# Patient Record
Sex: Female | Born: 1970 | Race: Black or African American | Hispanic: No | Marital: Married | Smoking: Never smoker
Health system: Southern US, Community
[De-identification: ages and names within clinical notes are randomized; demographics above are authoritative.]

## PROBLEM LIST (undated history)

## (undated) DIAGNOSIS — R1031 Right lower quadrant pain: Secondary | ICD-10-CM

## (undated) DIAGNOSIS — G43009 Migraine without aura, not intractable, without status migrainosus: Secondary | ICD-10-CM

## (undated) DIAGNOSIS — O039 Complete or unspecified spontaneous abortion without complication: Secondary | ICD-10-CM

## (undated) DIAGNOSIS — L309 Dermatitis, unspecified: Secondary | ICD-10-CM

## (undated) DIAGNOSIS — K219 Gastro-esophageal reflux disease without esophagitis: Secondary | ICD-10-CM

## (undated) DIAGNOSIS — T7840XA Allergy, unspecified, initial encounter: Secondary | ICD-10-CM

## (undated) DIAGNOSIS — K5900 Constipation, unspecified: Secondary | ICD-10-CM

## (undated) DIAGNOSIS — J342 Deviated nasal septum: Secondary | ICD-10-CM

## (undated) DIAGNOSIS — E669 Obesity, unspecified: Secondary | ICD-10-CM

## (undated) DIAGNOSIS — N9489 Other specified conditions associated with female genital organs and menstrual cycle: Secondary | ICD-10-CM

## (undated) DIAGNOSIS — G4733 Obstructive sleep apnea (adult) (pediatric): Secondary | ICD-10-CM

## (undated) DIAGNOSIS — M549 Dorsalgia, unspecified: Secondary | ICD-10-CM

## (undated) DIAGNOSIS — I10 Essential (primary) hypertension: Secondary | ICD-10-CM

## (undated) DIAGNOSIS — K5792 Diverticulitis of intestine, part unspecified, without perforation or abscess without bleeding: Secondary | ICD-10-CM

## (undated) DIAGNOSIS — G4730 Sleep apnea, unspecified: Secondary | ICD-10-CM

## (undated) DIAGNOSIS — G8929 Other chronic pain: Secondary | ICD-10-CM

## (undated) DIAGNOSIS — F419 Anxiety disorder, unspecified: Secondary | ICD-10-CM

## (undated) HISTORY — DX: Sleep apnea, unspecified: G47.30

## (undated) HISTORY — DX: Allergy, unspecified, initial encounter: T78.40XA

## (undated) HISTORY — PX: POLYPECTOMY: SHX149

## (undated) HISTORY — DX: Other chronic pain: G89.29

## (undated) HISTORY — DX: Right lower quadrant pain: R10.31

## (undated) HISTORY — DX: Essential (primary) hypertension: I10

## (undated) HISTORY — PX: PARTIAL HYSTERECTOMY: SHX80

## (undated) HISTORY — DX: Anxiety disorder, unspecified: F41.9

## (undated) HISTORY — DX: Other specified conditions associated with female genital organs and menstrual cycle: N94.89

## (undated) HISTORY — DX: Migraine without aura, not intractable, without status migrainosus: G43.009

## (undated) HISTORY — DX: Complete or unspecified spontaneous abortion without complication: O03.9

## (undated) HISTORY — PX: REDUCTION MAMMAPLASTY: SUR839

## (undated) HISTORY — DX: Deviated nasal septum: J34.2

## (undated) HISTORY — DX: Dorsalgia, unspecified: M54.9

## (undated) HISTORY — PX: FOOT SURGERY: SHX648

## (undated) HISTORY — DX: Gastro-esophageal reflux disease without esophagitis: K21.9

## (undated) HISTORY — DX: Dermatitis, unspecified: L30.9

## (undated) HISTORY — DX: Constipation, unspecified: K59.00

## (undated) HISTORY — DX: Obstructive sleep apnea (adult) (pediatric): G47.33

## (undated) HISTORY — DX: Obesity, unspecified: E66.9

---

## 1998-04-13 ENCOUNTER — Other Ambulatory Visit: Admission: RE | Admit: 1998-04-13 | Discharge: 1998-04-13 | Payer: Self-pay | Admitting: Obstetrics and Gynecology

## 1998-10-23 ENCOUNTER — Inpatient Hospital Stay (HOSPITAL_COMMUNITY): Admission: AD | Admit: 1998-10-23 | Discharge: 1998-10-23 | Payer: Self-pay | Admitting: Obstetrics and Gynecology

## 1998-11-12 HISTORY — PX: TUBAL LIGATION: SHX77

## 1998-11-17 ENCOUNTER — Inpatient Hospital Stay (HOSPITAL_COMMUNITY): Admission: AD | Admit: 1998-11-17 | Discharge: 1998-11-19 | Payer: Self-pay | Admitting: Obstetrics and Gynecology

## 1998-12-26 ENCOUNTER — Other Ambulatory Visit: Admission: RE | Admit: 1998-12-26 | Discharge: 1998-12-26 | Payer: Self-pay | Admitting: Obstetrics and Gynecology

## 1999-11-13 HISTORY — PX: COLONOSCOPY: SHX174

## 2000-01-17 ENCOUNTER — Other Ambulatory Visit: Admission: RE | Admit: 2000-01-17 | Discharge: 2000-01-17 | Payer: Self-pay | Admitting: Obstetrics and Gynecology

## 2001-02-12 ENCOUNTER — Other Ambulatory Visit: Admission: RE | Admit: 2001-02-12 | Discharge: 2001-02-12 | Payer: Self-pay | Admitting: Obstetrics and Gynecology

## 2001-08-21 ENCOUNTER — Encounter: Payer: Self-pay | Admitting: Obstetrics and Gynecology

## 2001-08-21 ENCOUNTER — Encounter: Admission: RE | Admit: 2001-08-21 | Discharge: 2001-08-21 | Payer: Self-pay | Admitting: Obstetrics and Gynecology

## 2002-03-18 ENCOUNTER — Other Ambulatory Visit: Admission: RE | Admit: 2002-03-18 | Discharge: 2002-03-18 | Payer: Self-pay | Admitting: Obstetrics and Gynecology

## 2003-03-04 ENCOUNTER — Other Ambulatory Visit: Admission: RE | Admit: 2003-03-04 | Discharge: 2003-03-04 | Payer: Self-pay | Admitting: *Deleted

## 2004-03-13 ENCOUNTER — Encounter: Admission: RE | Admit: 2004-03-13 | Discharge: 2004-03-13 | Payer: Self-pay | Admitting: Family Medicine

## 2004-03-31 ENCOUNTER — Other Ambulatory Visit: Admission: RE | Admit: 2004-03-31 | Discharge: 2004-03-31 | Payer: Self-pay | Admitting: Obstetrics and Gynecology

## 2004-07-03 ENCOUNTER — Encounter: Admission: RE | Admit: 2004-07-03 | Discharge: 2004-07-03 | Payer: Self-pay | Admitting: Internal Medicine

## 2004-09-29 ENCOUNTER — Ambulatory Visit: Payer: Self-pay | Admitting: Family Medicine

## 2005-06-11 ENCOUNTER — Ambulatory Visit: Payer: Self-pay | Admitting: Gastroenterology

## 2005-07-03 ENCOUNTER — Ambulatory Visit: Payer: Self-pay | Admitting: Gastroenterology

## 2005-07-03 LAB — HM MAMMOGRAPHY

## 2006-03-22 ENCOUNTER — Ambulatory Visit: Payer: Self-pay | Admitting: Family Medicine

## 2006-05-13 ENCOUNTER — Encounter: Admission: RE | Admit: 2006-05-13 | Discharge: 2006-05-13 | Payer: Self-pay | Admitting: Obstetrics and Gynecology

## 2006-06-25 ENCOUNTER — Ambulatory Visit (HOSPITAL_COMMUNITY): Admission: RE | Admit: 2006-06-25 | Discharge: 2006-06-25 | Payer: Self-pay | Admitting: Gastroenterology

## 2006-10-11 ENCOUNTER — Ambulatory Visit: Payer: Self-pay | Admitting: Internal Medicine

## 2007-09-09 ENCOUNTER — Ambulatory Visit: Payer: Self-pay | Admitting: Internal Medicine

## 2007-09-12 ENCOUNTER — Ambulatory Visit: Payer: Self-pay | Admitting: Internal Medicine

## 2007-09-15 LAB — CONVERTED CEMR LAB
ALT: 18 units/L (ref 0–35)
Basophils Absolute: 0 10*3/uL (ref 0.0–0.1)
Chloride: 107 meq/L (ref 96–112)
Cholesterol: 183 mg/dL (ref 0–200)
Eosinophils Absolute: 0.1 10*3/uL (ref 0.0–0.6)
Eosinophils Relative: 2 % (ref 0.0–5.0)
GFR calc Af Amer: 104 mL/min
GFR calc non Af Amer: 86 mL/min
Glucose, Bld: 87 mg/dL (ref 70–99)
HCT: 40.2 % (ref 36.0–46.0)
Lymphocytes Relative: 31.2 % (ref 12.0–46.0)
MCHC: 34.8 g/dL (ref 30.0–36.0)
MCV: 93 fL (ref 78.0–100.0)
Neutro Abs: 2.8 10*3/uL (ref 1.4–7.7)
Neutrophils Relative %: 57.7 % (ref 43.0–77.0)
Potassium: 4.1 meq/L (ref 3.5–5.1)
RBC: 4.32 M/uL (ref 3.87–5.11)
Sodium: 141 meq/L (ref 135–145)
Total CHOL/HDL Ratio: 2.9
Triglycerides: 66 mg/dL (ref 0–149)
WBC: 4.8 10*3/uL (ref 4.5–10.5)

## 2008-03-25 ENCOUNTER — Ambulatory Visit: Payer: Self-pay | Admitting: Internal Medicine

## 2008-04-21 ENCOUNTER — Encounter: Payer: Self-pay | Admitting: Internal Medicine

## 2008-04-27 ENCOUNTER — Telehealth (INDEPENDENT_AMBULATORY_CARE_PROVIDER_SITE_OTHER): Payer: Self-pay | Admitting: *Deleted

## 2008-04-28 ENCOUNTER — Telehealth (INDEPENDENT_AMBULATORY_CARE_PROVIDER_SITE_OTHER): Payer: Self-pay | Admitting: *Deleted

## 2008-04-28 ENCOUNTER — Encounter: Payer: Self-pay | Admitting: Internal Medicine

## 2008-06-23 ENCOUNTER — Ambulatory Visit: Payer: Self-pay | Admitting: Internal Medicine

## 2008-06-23 LAB — CONVERTED CEMR LAB
Blood in Urine, dipstick: NEGATIVE
Nitrite: NEGATIVE
Protein, U semiquant: NEGATIVE
Specific Gravity, Urine: 1.01
Urobilinogen, UA: 0.2
WBC Urine, dipstick: NEGATIVE

## 2008-06-29 ENCOUNTER — Encounter: Admission: RE | Admit: 2008-06-29 | Discharge: 2008-06-29 | Payer: Self-pay | Admitting: Internal Medicine

## 2008-06-30 ENCOUNTER — Telehealth: Payer: Self-pay | Admitting: Internal Medicine

## 2008-07-02 ENCOUNTER — Encounter (INDEPENDENT_AMBULATORY_CARE_PROVIDER_SITE_OTHER): Payer: Self-pay | Admitting: *Deleted

## 2008-09-08 ENCOUNTER — Ambulatory Visit: Payer: Self-pay | Admitting: Internal Medicine

## 2008-09-14 ENCOUNTER — Ambulatory Visit: Payer: Self-pay | Admitting: Family Medicine

## 2008-10-12 HISTORY — PX: BREAST REDUCTION SURGERY: SHX8

## 2008-10-12 HISTORY — PX: REDUCTION MAMMAPLASTY: SUR839

## 2009-02-01 ENCOUNTER — Ambulatory Visit: Payer: Self-pay | Admitting: Internal Medicine

## 2009-04-27 ENCOUNTER — Ambulatory Visit: Payer: Self-pay | Admitting: Internal Medicine

## 2009-04-27 DIAGNOSIS — R110 Nausea: Secondary | ICD-10-CM

## 2009-04-28 ENCOUNTER — Ambulatory Visit: Payer: Self-pay | Admitting: Internal Medicine

## 2009-04-28 LAB — CONVERTED CEMR LAB: Beta hcg, urine, semiquantitative: NEGATIVE

## 2009-05-03 LAB — CONVERTED CEMR LAB
ALT: 21 units/L (ref 0–35)
Amylase: 97 units/L (ref 27–131)
Basophils Absolute: 0 10*3/uL (ref 0.0–0.1)
CO2: 28 meq/L (ref 19–32)
Calcium: 8.9 mg/dL (ref 8.4–10.5)
Creatinine, Ser: 0.8 mg/dL (ref 0.4–1.2)
GFR calc non Af Amer: 102.49 mL/min (ref >60)
HCT: 37 % (ref 36.0–46.0)
Hemoglobin: 12.8 g/dL (ref 12.0–15.0)
Lipase: 22 units/L (ref 11.0–59.0)
Lymphs Abs: 1.9 10*3/uL (ref 0.7–4.0)
MCHC: 34.6 g/dL (ref 30.0–36.0)
MCV: 92.1 fL (ref 78.0–100.0)
Monocytes Absolute: 0.3 10*3/uL (ref 0.1–1.0)
Monocytes Relative: 6.3 % (ref 3.0–12.0)
Neutro Abs: 2.2 10*3/uL (ref 1.4–7.7)
Platelets: 195 10*3/uL (ref 150.0–400.0)
RDW: 12.7 % (ref 11.5–14.6)
Total Bilirubin: 1 mg/dL (ref 0.3–1.2)
Total Protein: 7.1 g/dL (ref 6.0–8.3)

## 2009-06-09 ENCOUNTER — Ambulatory Visit: Payer: Self-pay | Admitting: Internal Medicine

## 2009-06-09 DIAGNOSIS — G8929 Other chronic pain: Secondary | ICD-10-CM

## 2009-06-09 DIAGNOSIS — R1031 Right lower quadrant pain: Secondary | ICD-10-CM

## 2009-06-09 LAB — CONVERTED CEMR LAB
Glucose, Urine, Semiquant: NEGATIVE
Nitrite: NEGATIVE
WBC Urine, dipstick: NEGATIVE
pH: 7

## 2009-06-14 ENCOUNTER — Ambulatory Visit: Payer: Self-pay | Admitting: Cardiology

## 2009-06-20 ENCOUNTER — Telehealth: Payer: Self-pay | Admitting: Internal Medicine

## 2009-07-07 ENCOUNTER — Encounter: Admission: RE | Admit: 2009-07-07 | Discharge: 2009-07-07 | Payer: Self-pay | Admitting: Obstetrics and Gynecology

## 2009-11-28 ENCOUNTER — Ambulatory Visit: Payer: Self-pay | Admitting: Internal Medicine

## 2009-12-01 ENCOUNTER — Ambulatory Visit: Payer: Self-pay | Admitting: Internal Medicine

## 2009-12-01 ENCOUNTER — Telehealth: Payer: Self-pay | Admitting: Gastroenterology

## 2009-12-02 ENCOUNTER — Ambulatory Visit: Payer: Self-pay | Admitting: Gastroenterology

## 2009-12-02 ENCOUNTER — Encounter: Payer: Self-pay | Admitting: Gastroenterology

## 2009-12-08 ENCOUNTER — Ambulatory Visit: Payer: Self-pay | Admitting: Gastroenterology

## 2009-12-13 HISTORY — PX: RIGHT OOPHORECTOMY: SHX2359

## 2009-12-13 HISTORY — PX: ABDOMINAL HYSTERECTOMY: SHX81

## 2010-06-12 LAB — CONVERTED CEMR LAB: Pap Smear: NORMAL

## 2010-07-06 ENCOUNTER — Ambulatory Visit: Payer: Self-pay | Admitting: Internal Medicine

## 2010-07-06 DIAGNOSIS — E669 Obesity, unspecified: Secondary | ICD-10-CM

## 2010-07-06 DIAGNOSIS — E66812 Obesity, class 2: Secondary | ICD-10-CM | POA: Insufficient documentation

## 2010-07-06 HISTORY — DX: Obesity, unspecified: E66.9

## 2010-07-07 ENCOUNTER — Ambulatory Visit: Payer: Self-pay | Admitting: Internal Medicine

## 2010-07-10 ENCOUNTER — Encounter: Admission: RE | Admit: 2010-07-10 | Discharge: 2010-07-10 | Payer: Self-pay | Admitting: Obstetrics and Gynecology

## 2010-07-11 LAB — CONVERTED CEMR LAB
Cholesterol: 182 mg/dL (ref 0–200)
Glucose, Bld: 89 mg/dL (ref 70–99)

## 2010-09-08 ENCOUNTER — Ambulatory Visit: Payer: Self-pay | Admitting: Internal Medicine

## 2010-10-31 ENCOUNTER — Ambulatory Visit: Payer: Self-pay | Admitting: Internal Medicine

## 2010-11-07 LAB — CONVERTED CEMR LAB
Basophils Relative: 0.7 % (ref 0.0–3.0)
Chloride: 107 meq/L (ref 96–112)
Eosinophils Relative: 1.8 % (ref 0.0–5.0)
Folate: 13.5 ng/mL
HCT: 39.7 % (ref 36.0–46.0)
Hemoglobin: 13.6 g/dL (ref 12.0–15.0)
Lymphs Abs: 2.1 10*3/uL (ref 0.7–4.0)
MCV: 92.2 fL (ref 78.0–100.0)
Monocytes Absolute: 0.6 10*3/uL (ref 0.1–1.0)
Neutrophils Relative %: 21.3 % — ABNORMAL LOW (ref 43.0–77.0)
Potassium: 3.7 meq/L (ref 3.5–5.1)
RBC: 4.3 M/uL (ref 3.87–5.11)
Sodium: 140 meq/L (ref 135–145)
WBC: 3.6 10*3/uL — ABNORMAL LOW (ref 4.5–10.5)

## 2010-11-08 ENCOUNTER — Telehealth: Payer: Self-pay | Admitting: Internal Medicine

## 2010-12-03 ENCOUNTER — Encounter: Payer: Self-pay | Admitting: Obstetrics and Gynecology

## 2010-12-14 NOTE — Letter (Signed)
Summary: [facility] Instructions  [facility]  [address] [place], [place] [address]   Phone: [phone]  Fax: [phone]       NIGERIA LASSETER    Aug 27, 1981    MRN: [id number]        Procedure Day /Date: 12-08-09     Arrival Time: 1:00 PM     Procedure Time :2:00 PM     Location of Procedure:                    [facility] (4th Floor)                       PREPARATION FOR COLONOSCOPY WITH MOVIPREP   Starting 5 days prior to your procedure 12-03-09 do not eat nuts, seeds, popcorn, corn, beans, peas,  salads, or any raw vegetables.  Do not take any fiber supplements (e.g. Metamucil, Citrucel, and Benefiber).  THE DAY BEFORE YOUR PROCEDURE         DATE: 12-07-09  DAY: Wednesday  1.  Drink clear liquids the entire day-NO SOLID FOOD  2.  Do not drink anything colored red or purple.  Avoid juices with pulp.  No orange juice.  3.  Drink at least 64 oz. (8 glasses) of fluid/clear liquids during the day to prevent dehydration and help the prep work efficiently.  CLEAR LIQUIDS INCLUDE: Water Jello Ice Popsicles Tea (sugar ok, no milk/cream) Powdered fruit flavored drinks Coffee (sugar ok, no milk/cream) Gatorade Juice: apple, white grape, white cranberry  Lemonade Clear bullion, consomm, broth Carbonated beverages (any kind) Strained chicken noodle soup Hard Candy                             4.  In the morning, mix first dose of MoviPrep solution:    Empty 1 Pouch A and 1 Pouch B into the disposable container    Add lukewarm drinking water to the top line of the container. Mix to dissolve    Refrigerate (mixed solution should be used within 24 hrs)  5.  Begin drinking the prep at 5:00 p.m. The MoviPrep container is divided by 4 marks.   Every 15 minutes drink the solution down to the next mark (approximately 8 oz) until the full liter is complete.   6.  Follow completed prep with 16 oz of clear liquid of your choice (Nothing red or  purple).  Continue to drink clear liquids until bedtime.  7.  Before going to bed, mix second dose of MoviPrep solution:    Empty 1 Pouch A and 1 Pouch B into the disposable container    Add lukewarm drinking water to the top line of the container. Mix to dissolve    Refrigerate  THE DAY OF YOUR PROCEDURE      DATE: 12-08-09 DAY: Thursday  Beginning at 9:00 a.m. (5 hours before procedure):         1. Every 15 minutes, drink the solution down to the next mark (approx 8 oz) until the full liter is complete.  2. Follow completed prep with 16 oz. of clear liquid of your choice.    3. You may drink clear liquids until 12:00 PM Noon  (2 HOURS BEFORE PROCEDURE).   MEDICATION INSTRUCTIONS  Unless otherwise instructed, you should take regular prescription medications with a small sip of water   as early as possible the  morning of your procedure.        OTHER INSTRUCTIONS  You will need a responsible adult at least 41 years of age to accompany you and drive you home.   This person must remain in the waiting room during your procedure.  Wear loose fitting clothing that is easily removed.  Leave jewelry and other valuables at home.  However, you may wish to bring a book to read or  an iPod/MP3 player to listen to music as you wait for your procedure to start.  Remove all body piercing jewelry and leave at home.  Total time from sign-in until discharge is approximately 2-3 hours.  You should go home directly after your procedure and rest.  You can resume normal activities the  day after your procedure.  The day of your procedure you should not:   Drive   Make legal decisions   Operate machinery   Drink alcohol   Return to work  You will receive specific instructions about eating, activities and medications before you leave.    The above instructions have been reviewed and explained to me by   _______________________    I fully understand and can verbalize these  instructions _____________________________ Date _________

## 2010-12-14 NOTE — Assessment & Plan Note (Signed)
Summary: FLU SHOT///SPH  Nurse Visit   Allergies: 1)  ! Amoxicillin  Orders Added: 1)  Admin 1st Vaccine [90471] 2)  Flu Vaccine 61yrs + [03474] Flu Vaccine Consent Questions     Do you have a history of severe allergic reactions to this vaccine? no    Any prior history of allergic reactions to egg and/or gelatin? no    Do you have a sensitivity to the preservative Thimersol? no    Do you have a past history of Guillan-Barre Syndrome? no    Do you currently have an acute febrile illness? no    Have you ever had a severe reaction to latex? no    Vaccine information given and explained to patient? yes    Are you currently pregnant? no    Lot Number:[id number]   Exp Date:05/11/2010   Site Given right Deltoid IM .lbflu

## 2010-12-14 NOTE — Procedures (Signed)
Summary: [name]   Colonoscopy  Procedure date:  07/03/2005  Findings:      Location:  [facility].    Procedures Next Due Date:    Colonoscopy: 07/2010 Patient Name: Desiree Dunn, Desiree Dunn MRN:  Procedure Procedures: Colorectal cancer screening, high risk CPT: G0105.  Personnel: Endoscopist: Barbette Hair. Arlyce Dice, MD.  Patient Consent: Procedure, Alternatives, Risks and Benefits discussed, consent obtained, from patient.  Indications  Increased Risk Screening: For family history of colorectal neoplasia, in  parent age at onset: 2.  History  Current Medications: Patient is not currently taking Coumadin.  Pre-Exam Physical: Performed Jul 03, 2005. Cardio-pulmonary exam, HEENT exam , Abdominal exam, Mental status exam WNL.  Exam Exam: Extent of exam reached: Cecum, extent intended: Cecum.  The cecum was identified by IC valve. Colon retroflexion performed. ASA Classification: I. Tolerance: good.  Monitoring: Pulse and BP monitoring, Oximetry used. Supplemental O2 given. at 2 Liters.  Colon Prep Used Miralax for colon prep. Prep results: good.  Sedation Meds: Patient assessed and found to be appropriate for moderate (conscious) sedation. Sedation was managed by the Endoscopist. Fentanyl 75 mcg. given IV. Versed 8 mg. given IV.  Findings - NORMAL EXAM: Cecum to Rectum.  NORMAL EXAM: Rectum.   Assessment Normal examination.  Events  Unplanned Interventions: No intervention was required.  Unplanned Events: There were no complications. Plans  Post Exam Instructions: Post sedation instructions given.  Patient Education: Patient given standard instructions for: a normal exam.  Disposition: After procedure patient sent to recovery. After recovery patient sent home.  Scheduling/Referral: Colonoscopy, to Barbette Hair. Arlyce Dice, MD, around Jul 03, 2010.    cc: Desiree Dunn  This report was created from the original endoscopy report, which was  reviewed and signed by the above listed endoscopist.

## 2010-12-14 NOTE — Assessment & Plan Note (Signed)
Summary: flu shot/kn  Nurse Visit  CC: Flu shot./kb   Allergies: 1)  ! Amoxicillin  Orders Added: 1)  Admin 1st Vaccine [90471] 2)  Flu Vaccine 44yrs + [16109]       Flu Vaccine Consent Questions     Do you have a history of severe allergic reactions to this vaccine? no    Any prior history of allergic reactions to egg and/or gelatin? no    Do you have a sensitivity to the preservative Thimersol? no    Do you have a past history of Guillan-Barre Syndrome? no    Do you currently have an acute febrile illness? no    Have you ever had a severe reaction to latex? no    Vaccine information given and explained to patient? yes    Are you currently pregnant? no    Lot Number:[id number]   Exp Date:05/12/2011   Site Given  Left Deltoid IMu

## 2010-12-14 NOTE — Progress Notes (Signed)
Summary: sinus pain/pressure  Phone Note Call from Patient Call back at Home Phone [phone]   Details for Reason: [facility] Summary of Call: Patient left message on triage that she is having sinus pressure/pain. She has used the prescription strength nasal spray without much relief. Please advise. Initial call taken by: Lucious Groves CMA,  November 08, 2010 1:14 PM  Follow-up for Phone Call        rest, fluids, Tylenol Mucinex DM twice a day until better Call  in astepro 2 sprays  on each side of the nose twice a day for 2 weeks, #1 no RF If no better in a few days, consider call  a Z-Pak (patient is allergic to penicillin) Follow-up by: Nolon Rod. Paz MD,  November 09, 2010 9:05 AM  Additional Follow-up for Phone Call Additional follow up Details #1::        Left message for pt to call back. Army Fossa CMA  November 09, 2010 9:30 AM     Additional Follow-up for Phone Call Additional follow up Details #2::    I spoke w/ pt she is aware. Army Fossa CMA  November 09, 2010 11:20 AM   New/Updated Medications: ASTEPRO 0.15 % SOLN (AZELASTINE HCL) 2 sprays  on each side of the nose twice a day for 2 weeks Prescriptions: ASTEPRO 0.15 % SOLN (AZELASTINE HCL) 2 sprays  on each side of the nose twice a day for 2 weeks  #1 x 0   Entered by:   Army Fossa CMA   Authorized by:   Nolon Rod. Paz MD   Signed by:   Army Fossa CMA on 11/09/2010   Method used:   [facility]        [facility] [facility]retail)       [address]       [place], [place]  [address]       Ph: [phone] or [phone]       Fax: [phone]   RxID:   [id number]

## 2010-12-14 NOTE — Assessment & Plan Note (Signed)
Summary: RECTAL BLEEDING           (DesireeKAPLAN PT.)          Desiree Dunn   History of Present Illness Visit Type: Initial Consult Primary GI MD: Desiree Heaps MD [facility] Primary Provider: Willow Ora MD Requesting Provider: Willow Ora MD Chief Complaint: rectal bleeding History of Present Illness:   40 YO FEMALE KNOWN TO DR Desiree Dunn WITH FAMILY HX OF COLON CANCER IN HER MOTHER; DECEASED BEFORE AGE 30. SHE HAD A NEGATIVE COLONOSCOPY IN 2001, AND IN 2006. SHE IS REFERRED TODAY WITH RECTAL BLEEDING PER Desiree Dunn. SHE HAD ONSET WITH BRB  ABOUT 6 DAYS AGO. SHE HAS HAD  MILD  RECTAL DISCOMFORT AND PRIMARILY HAS NOTICED BLOOD ON THE TISSUE. NO MELENA. SHE SAW DR Dunn YESTERDAY AND NOTED AN EXTERNAL HEMORRHOID, AND ON ANOSCOPY SAW INTERNAL HEMORROIDS BUT BLOOD COMING FROM MORE PROXIMAL. PT IS ALSO SCHEDULED TO HAVE A HYSTERECTOMY ON FEB 4TH IN [place].   GI Review of Systems    Reports abdominal pain and  bloating.     Location of  Abdominal pain: lower abdomen.    Denies acid reflux, belching, chest pain, dysphagia with liquids, dysphagia with solids, heartburn, loss of appetite, nausea, vomiting, vomiting blood, and  weight loss.      Reports rectal bleeding and  rectal pain.     Denies anal fissure, black tarry stools, change in bowel habit, constipation, diarrhea, diverticulosis, fecal incontinence, heme positive stool, hemorrhoids, irritable bowel syndrome, jaundice, light color stool, and  liver problems.   Current Medications (verified): 1)  Astepro 0.15 % Soln (Azelastine Hcl) .[name].. 1 Spray Each Nostril Qd 2)  Mvi 3)  Vit E 4)  Anucort-Hc 25 Mg Supp (Hydrocortisone Acetate) .[name].. 1 Pr Twice A Day X 3 Days, Then As Needed  Allergies (verified): 1)  ! Amoxicillin  Past History:  Past Medical History: Reviewed history from 09/09/2007 and no changes required. g4 p2 miscarriage x 2 ectopic pregnancy 1995  Past Surgical History: Tubal ligation Breast reduction 12-09 NEGATIVE COLONOSCOPY 2001, AND  2006  Family History: Reviewed history from 09/09/2007 and no changes required. Father: deceased, stroke, DM, HTN colon ca-- mom  Dx in her 9'S  HTN-- sist Breast ca-- GM Family History of Colon CA 1st degree relative <60  Social History: Reviewed history from 09/09/2007 and no changes required. Married 2 kids  Review of Systems  The patient denies allergy/sinus, anemia, anxiety-new, arthritis/joint pain, back pain, blood in urine, breast changes/lumps, change in vision, confusion, cough, coughing up blood, depression-new, fainting, fatigue, fever, headaches-new, hearing problems, heart murmur, heart rhythm changes, itching, menstrual pain, muscle pains/cramps, night sweats, nosebleeds, pregnancy symptoms, shortness of breath, skin rash, sleeping problems, sore throat, swelling of feet/legs, swollen lymph glands, thirst - excessive , urination - excessive , urination changes/pain, urine leakage, vision changes, and voice change.         ROS OTHERWISE AS IN HPI  Vital Signs:  Patient profile:   40 year old female Height:      65 inches Weight:      219.50 pounds BMI:     36.66 Pulse rate:   70 / minute Pulse rhythm:   regular BP sitting:   118 / 70  (left arm)  Vitals Entered By: Desiree Dunn CMA Desiree Dunn) (December 02, 2009 1:29 PM)  Physical Exam  General:  Well developed, well nourished, no acute distress. Head:  Normocephalic and atraumatic. Eyes:  PERRLA, no icterus. Lungs:  Clear throughout  to auscultation. Heart:  Regular rate and rhythm; no murmurs, rubs,  or bruits. Abdomen:  SOFT, MILD TENDERNESS RLQ, NO MASS OR HSM, NO GUARDING OR REBOUND,BS+ Rectal:  NOT REPEATED,SEE DR Dunn'S NOTE FROM YESTERDAY Extremities:  No clubbing, cyanosis, edema or deformities noted. Neurologic:  Alert and  oriented x4;  grossly normal neurologically. Psych:  Alert and cooperative. Normal mood and affect.   Impression & Recommendations:  Problem # 1:  RECTAL BLEEDING  (ICD-569.3) Assessment Deteriorated 40 YO FEMALE WITH 5-6 DAY HX OF INTERMITTENT HEMATOCHEZIA, PT WITH INT AND EXT. HEMORRHOIDS, BUT ANOSCOPY ?BLOOD FROM MORE PROXIMAL COLON. PT ALSO WITH FAMILY HX OF COLON CANCER IN HER MOTHER, DUE FOR FOLLOW UP COLONOSCOPY 5/11. CONTINUE ANUCORT SUPP. SCHEDULE FOR COLONOSCOPY WITH DR Desiree Dunn, NEXT WEEK PRIOR TO HER PLANNED HYSTERECTOMY, PROCEDURE DISCUSSED IN DETAIL WITH THE PT. INCLUDING RISKS, BENEFITS, ALTERNATIVES. Orders: Colonoscopy (Colon)  Problem # 2:  FAMILY HISTORY OF COLON CA 1ST DEGREE RELATIVE <60 (ICD-V16.0) Assessment: Comment Only Orders: Colonoscopy (Colon)  Patient Instructions: 1)  We have scheduled you for a Colonoscopy for 12-08-09 with Dr. Arlyce Dunn. 2)  Colonoscopy and conscous sedation brochure provided. 3)  We [facility] Prep [facility]  4)  Copy sent to : Desiree Ora, MD  Prescriptions: MOVIPREP 100 GM  SOLR (PEG-KCL-NACL-NASULF-NA ASC-C) As per prep instructions.  #1 x 0   Entered by:   Desiree Dunn NCMA   Authorized by:   Desiree Cooper PA-c   Signed by:   Desiree Dunn NCMA on 12/02/2009   Method used:   [facility]        [facility] (retail)       [address]       [place], [place]  [address]       Ph: [phone] or [phone]       Fax: [phone]   RxID:   [id number]

## 2010-12-14 NOTE — Assessment & Plan Note (Signed)
Summary: DISCUSS WEIGHT LOSS/KN   Vital Signs:  Patient profile:   40 year old female Weight:      217.38 pounds BMI:     36.30 Pulse rate:   94 / minute Pulse rhythm:   regular BP sitting:   122 / 86  (left arm) Cuff size:   large  Vitals Entered By: Army Fossa CMA (July 06, 2010 10:56 AM) CC: Discuss Weight Loss.  Comments declines flu shot today-will come back.    History of Present Illness: concerned about her weight Prepregnancy weight was around 150 pounds, current weight 217   Current Medications (verified): 1)  Astepro 0.15 % Soln (Azelastine Hcl) .[name].. 1 Spray Each Nostril Qd 2)  Mvi 3)  Vit E 4)  Anucort-Hc 25 Mg Supp (Hydrocortisone Acetate) .[name].. 1 Pr Twice A Day X 3 Days, Then As Needed  Allergies: 1)  ! Amoxicillin  Past History:  Past Medical History: Reviewed history from 09/09/2007 and no changes required. g4 p2 miscarriage x 2 ectopic pregnancy 1995  Past Surgical History: Tubal ligation Breast reduction 12-09 NEGATIVE COLONOSCOPY 2001, AND 2006 hysterectomy, R oophorectomy 12-2009 [facility]  Social History: Married 2 kids (14 and 11) works at [employer], sedentary, Scientist, product/process development  tobacco-- no ETOH--no  Physical Exam  General:  alert, well-developed, and overweight-appearing.   Psych:  Oriented X3, memory intact for recent and remote, normally interactive, good eye contact, not anxious appearing, and not depressed appearing.     Impression & Recommendations:  Problem # 1:  OBESITY, UNSPECIFIED (ICD-278.00) we spent 20 minutes with her, more than 50% of the time counseling : counseled  about a  healthy diet and portion control I gave her a CD w/  information about ALLI and diet Weight Watchers ? Referral to a nutritionist if interested Optifast? Recommended at least 30 minutes daily  fast walking, encouraged to do that gradually.  Complete Medication List: 1)  Astepro 0.15 % Soln (Azelastine hcl) .[name].. 1 spray each nostril qd 2)  Mvi    3)  Vit E  4)  Anucort-hc 25 Mg Supp (Hydrocortisone acetate) .[name].. 1 pr twice a day x 3 days, then as needed  Patient Instructions: 1)  came back fasting: 2)  FLP TSH  CBG---dx 278.0 3)  Please schedule a follow-up appointment in 4 months (physical exam)

## 2010-12-14 NOTE — Procedures (Signed)
Summary: Colonoscopy  Patient: Desiree Dunn Note: All result statuses are Final unless otherwise noted.  Tests: (1) Colonoscopy (COL)   COL Colonoscopy           DONE     [facility]     [address]     [place], [place]  [address]           COLONOSCOPY PROCEDURE REPORT           PATIENT:  Desiree, Dunn  MR#:  [id number]     BIRTHDATE:  [birth year], 38 yrs. old  GENDER:  female           ENDOSCOPIST:  Barbette Hair. Arlyce Dice, MD     Referred by:           PROCEDURE DATE:  12/08/2009     PROCEDURE:  Colonoscopy, Diagnostic     ASA CLASS:  Class I     INDICATIONS:  family history of colon cancer, rectal bleeding     parent     limited rectal bleeding           MEDICATIONS:   Fentanyl 75 mcg IV, Versed 7 mg IV           DESCRIPTION OF PROCEDURE:   After the risks benefits and     alternatives of the procedure were thoroughly explained, informed     consent was obtained.  Digital rectal exam was performed and     revealed no abnormalities.   The LB [id number] endoscope     was introduced through the anus and advanced to the cecum, which     was identified by the ileocecal valve, without limitations.  The     quality of the prep was good, using MoviPrep.  The instrument was     then slowly withdrawn as the colon was fully examined.     <<PROCEDUREIMAGES>>           FINDINGS:  Diverticula were found in the ascending colon. Rare     right colon diverticulum  This was otherwise a normal examination     of the colon (see image1, image2, image3, image4, image5, image6,     image7, and image8).   Retroflexed views in the rectum revealed no     abnormalities.    The scope was then withdrawn from the patient     and the procedure completed.           COMPLICATIONS:  None           ENDOSCOPIC IMPRESSION:     1) Diverticula in the ascending colon     2) Otherwise normal examination           Limited rectal bleeding likely secondary to hemorrhoids        RECOMMENDATIONS:     1) Given your significant family history of colon cancer, you     should have a repeat colonoscopy in 5 years           REPEAT EXAM:  No           ______________________________     Barbette Hair. Arlyce Dice, MD           CC:  Willow Ora, MD           n.     Rosalie Doctor:   Barbette Hair. Hawley Pavia at 12/08/2009 02:34 PM           Sue Lush, [phone]  Note: An exclamation mark (!) indicates  a result that was not dispersed into the flowsheet. Document Creation Date: 12/08/2009 2:34 PM _______________________________________________________________________  (1) Order result status: Final Collection or observation date-time: 12/08/2009 14:29 Requested date-time:  Receipt date-time:  Reported date-time:  Referring Physician:   Ordering Physician: Melvia Heaps [phone]) Specimen Source:  Source: Launa Grill Order Number: [id number] Lab site:

## 2010-12-14 NOTE — Assessment & Plan Note (Signed)
Summary: cpx///sph   Vital Signs:  Patient profile:   40 year old female Height:      65 inches Weight:      206.25 pounds Pulse rate:   105 / minute Pulse rhythm:   regular BP sitting:   128 / 82  (left arm) Cuff size:   large  Vitals Entered By: Army Fossa CMA (October 31, 2010 2:46 PM) CC: CPX, not fasting Comments no pap- has a gyn pain on (r) side - reason for partial hysterectomy not fasting  [facility]    History of Present Illness: CPX, not fasting  feels well exercise more, changed his diet, lost some wt    still has pain on  R side,  since 1995 when she had a ectopic preg. (R) had a hysterectomy  2 -2011 d/t pelvic congestion; did not help the chronic R sided pain   Preventive Screening-Counseling & Management  Caffeine-Diet-Exercise     Does Patient Exercise: yes     Type of exercise: active at work  Allergies: 1)  ! Amoxicillin  Past History:  Past Medical History: g4 p2 miscarriage x 1,  ectopic pregnancy x1  (R)  1995  chronic RLQ pain  since 1995 when she had a ectopic preg. (R) had a hysterectomy  2 -2011 d/t pelvic congestion; did not help the chronic R sided pain  her gynecologist prescribed Cymbalta  Past Surgical History: Reviewed history from 07/06/2010 and no changes required. Tubal ligation Breast reduction 12-09 NEGATIVE COLONOSCOPY 2001, AND 2006 hysterectomy, R oophorectomy 12-2009 [facility]  Family History: stroke--F   DM -- F  MI--no HTN--F  S MI-- colon ca-- mom  Dx in her 17'S  Breast ca-- GM    Social History: Married 2 kids (14 and 74) works at [employer], sedentary,  Runner, broadcasting/film/video  tobacco-- no ETOH--no diet-- improved exercise -- some better Does Patient Exercise:  yes  Review of Systems CV:  Denies chest pain or discomfort, palpitations, and swelling of feet. Resp:  Denies cough and shortness of breath. GI:  Denies diarrhea and vomiting; N if dies not have regular BMs . GU:  sees Gyn . Psych:   Denies anxiety and depression.  Physical Exam  General:  alert and well-developed.   Neck:  no masses and no thyromegaly.   Lungs:  Normal respiratory effort, chest expands symmetrically. Lungs are clear to auscultation, no crackles or wheezes. Heart:  normal rate, regular rhythm, and no murmur.   Abdomen:  soft, normal bowel sounds, no distention, no masses, no guarding, and no rigidity.  slightly tender in the right lower quadrant Extremities:  no lower extremity edema Psych:  Oriented X3, memory intact for recent and remote, normally interactive, good eye contact, not anxious appearing, and not depressed appearing.     Impression & Recommendations:  Problem # 1:  HEALTH SCREENING (ICD-V70.0) Td 2004  had a flu shot  female care per gyn   see FH: Cscope 2001, 2006 and again 11-2009 d/t  rectal bleed, neg except for tics----- next 5 years   encouraged to continue with her healthy diet, encourage to exercise more Recent cholesterol  good, labs   Orders: Venipuncture (16109) TLB-BMP (Basic Metabolic Panel-BMET) (80048-METABOL) TLB-CBC Platelet - w/Differential (85025-CBCD) TLB-B12 + Folate Pnl (60454_09811-B14/NWG) TLB-ALT (SGPT) (84460-ALT) TLB-AST (SGOT) (84450-SGOT) Specimen Handling (95621)  Problem # 2:  RLQ PAIN (ICD-789.03) chronic issue See past medical history Recommend observation, patient to call if something changed Was prescribed Cymbalta by gynecology,  I encouraged her to try and see if that helps  Complete Medication List: 1)  Astepro 0.15 % Soln (Azelastine hcl) .[name].. 1 spray each nostril qd 2)  Mvi  3)  Vit E  4)  Anucort-hc 25 Mg Supp (Hydrocortisone acetate) .[name].. 1 pr twice a day x 3 days, then as needed 5)  B-pollen   Patient Instructions: 1)  Please schedule a follow-up appointment in 1 year.    Orders Added: 1)  Venipuncture [36415] 2)  TLB-BMP (Basic Metabolic Panel-BMET) [80048-METABOL] 3)  TLB-CBC Platelet - w/Differential [85025-CBCD] 4)   TLB-B12 + Folate Pnl [82746_82607-B12/FOL] 5)  TLB-ALT (SGPT) [84460-ALT] 6)  TLB-AST (SGOT) [84450-SGOT] 7)  Specimen Handling [99000] 8)  Est. Patient age 75-64 [34]     Risk Factors:  Alcohol use:  no Exercise:  yes    Type:  active at work  PAP Smear History:     Date of Last PAP Smear:  06/12/2010    Results:  normal per pt   Mammogram History:     Date of Last Mammogram:  05/12/2010    Results:  normal per pt      Preventive Care Screening  Pap Smear:    Date:  06/12/2010    Results:  normal per pt   Mammogram:    Date:  05/12/2010    Results:  normal per pt

## 2010-12-14 NOTE — Assessment & Plan Note (Signed)
Summary: hemorrhoids/swh   Vital Signs:  Patient profile:   40 year old female Height:      65 inches Weight:      219.4 pounds BMI:     36.64 Pulse rate:   66 / minute BP sitting:   108 / 70  Vitals Entered By: Dena Billet CC: hemorrhoids Comments pt. states started bleeding yesterday morning and again this morning. blood is bright red. pt. used lidocaine saturday, but stopped yesterday when saw the bleeding.   History of Present Illness: history of hemorrhoids started with discomfort in that area 5 days ago yesterday she saw some blood for the first time discomfort still there  Allergies: 1)  ! Amoxicillin  Past History:  Past Medical History: Reviewed history from 09/09/2007 and no changes required. g4 p2 miscarriage x 2 ectopic pregnancy 1995  Past Surgical History: Reviewed history from 02/01/2009 and no changes required. Tubal ligation Breast reduction 12-09  Social History: Reviewed history from 09/09/2007 and no changes required. Married 2 kids  Review of Systems       denies fever, nausea, vomiting no vaginal bleeding  Physical Exam  General:  alert and well-developed.   Rectal:  has a 1 cm external hemorrhoid, slightly tender, doesn't seem to be clotted digital rectal exam showed no mass anoscopy: She has internal hemorrhoids but doen't seem to be bleeding, I saw some blood proximal from the internal hemorrhoids   Impression & Recommendations:  Problem # 1:  RECTAL BLEEDING (ICD-569.3) she does have a slightly tender external hemorrhoids, the internal hemorrhoids don't seem to be bleeding refer to GI to rule out a more proximal source of bleeding, sigmoidoscopy? in the  meantime will prescribe anucort, see instructions  (to have a hysterectomy on 12-16-09, will try to get GI before that)  Orders: Gastroenterology Referral (GI)  Complete Medication List: 1)  Astepro 0.15 % Soln (Azelastine hcl) .[name].. 1 spray each nostril qd 2)  Mvi  3)   Vit E  4)  Anucort-hc 25 Mg Supp (Hydrocortisone acetate) .[name].. 1 pr twice a day x 3 days, then as needed  Patient Instructions: 1)  anucort suppositories as prescribed 2)  nupercainal cream OTC as needed 3)  call me if the bleeding increase or he is severe 4)  use baby wipes Prescriptions: ANUCORT-HC 25 MG SUPP (HYDROCORTISONE ACETATE) 1 PR twice a day x 3 days, then as needed  #12 x 0   Entered and Authorized by:   Elita Quick E. Baylynn Shifflett MD   Signed by:   Nolon Rod. Kebra Lowrimore MD on 12/01/2009   Method used:   Reprint   RxID:   [id number] ANUCORT-HC 25 MG SUPP (HYDROCORTISONE ACETATE) 1 PR twice a day x 3 days, then as needed  #12 x 0   Entered and Authorized by:   Nolon Rod. Herman Mell MD   Signed by:   Nolon Rod. Jasher Barkan MD on 12/01/2009   Method used:   [facility]        [facility] [address] [phone]* (retail)       [address]       [place], [place]  [address]       Ph: [phone] or [phone]       Fax: [phone]   RxID:   [id number]

## 2010-12-14 NOTE — Progress Notes (Signed)
Summary: Rectal Bleeding  Phone Note From Other Clinic   Caller: [email or web address]  [phone] Call For: Dr Arlyce Dice Reason for Call: Schedule Patient Appt Summary of Call: Rectal Bleeding would like pt seen fairly quickly due to an upcoming surgery in a couple of weeks. Initial call taken by: Leanor Kail [facility],  December 01, 2009 10:12 AM  Follow-up for Phone Call         Dr.Paz saw her today, rectal bleeding for 2 days, hemorrhoids seen. She is scheduled for a hysterectomy on Feb.4th,2011.  Dr.Paz wants GI work-up done before her surgery.  She will see Mike G[facility] on 12-02-09 at 1:30pm. Follow-up by: Laureen Ochs LPN,  December 01, 2009 10:30 AM

## 2010-12-22 ENCOUNTER — Inpatient Hospital Stay (INDEPENDENT_AMBULATORY_CARE_PROVIDER_SITE_OTHER)
Admission: RE | Admit: 2010-12-22 | Discharge: 2010-12-22 | Disposition: A | Discharge status: Home / Self Care | Payer: BC Managed Care – PPO | Source: Ambulatory Visit | Attending: Family Medicine | Admitting: Family Medicine

## 2010-12-22 DIAGNOSIS — J019 Acute sinusitis, unspecified: Secondary | ICD-10-CM

## 2010-12-22 DIAGNOSIS — J309 Allergic rhinitis, unspecified: Secondary | ICD-10-CM

## 2010-12-25 ENCOUNTER — Encounter: Payer: Self-pay | Admitting: Internal Medicine

## 2010-12-25 ENCOUNTER — Ambulatory Visit (INDEPENDENT_AMBULATORY_CARE_PROVIDER_SITE_OTHER): Payer: BC Managed Care – PPO | Admitting: Internal Medicine

## 2010-12-25 DIAGNOSIS — R079 Chest pain, unspecified: Secondary | ICD-10-CM | POA: Insufficient documentation

## 2010-12-25 DIAGNOSIS — R519 Headache, unspecified: Secondary | ICD-10-CM | POA: Insufficient documentation

## 2010-12-25 DIAGNOSIS — R51 Headache: Secondary | ICD-10-CM

## 2010-12-25 DIAGNOSIS — N644 Mastodynia: Secondary | ICD-10-CM | POA: Insufficient documentation

## 2010-12-26 ENCOUNTER — Other Ambulatory Visit: Payer: Self-pay | Admitting: Internal Medicine

## 2010-12-27 ENCOUNTER — Ambulatory Visit (INDEPENDENT_AMBULATORY_CARE_PROVIDER_SITE_OTHER)
Admission: RE | Admit: 2010-12-27 | Discharge: 2010-12-27 | Disposition: A | Discharge status: Home / Self Care | Payer: BC Managed Care – PPO | Source: Ambulatory Visit | Attending: Internal Medicine | Admitting: Internal Medicine

## 2010-12-27 DIAGNOSIS — R51 Headache: Secondary | ICD-10-CM

## 2011-01-01 ENCOUNTER — Telehealth: Payer: Self-pay | Admitting: Internal Medicine

## 2011-01-03 NOTE — Assessment & Plan Note (Signed)
Summary: ed f/u bp up/cbs   Vital Signs:  Patient profile:   40 year old female Weight:      208.13 pounds Pulse rate:   84 / minute Pulse rhythm:   regular BP sitting:   134 / 98  (left arm) Cuff size:   large  Vitals Entered By: Army Fossa CMA (December 25, 2010 11:46 AM) CC: ER f/u Comments Went fri night.  Having HA's in back of head  Gave her Omnicef and Prednisone BP was elevated Fri- 158/99, sat !58/102 fasting  Sedila    History of Present Illness: several weeks history of a headache located at the nuchal area and frontally " behind the eyes " She also has URI type of symptoms (see review of systems) however the symptoms started only a few days ago. It  has been the most persistent headache she ever had. Some nausea associated with it, neck feels stiff sometimes  2 weeks ago, for several days, had chest pain, it was located in the middle of the chest anteriorly with no radiation and the pain  was somehow different  when she moved her arms  Her BP was also elevated @ the ER  ER records reviewed her main complaint over there was sinus congestion BP was 159/99, no blood work or x-rays were done she was prescribed cefdinir 300 mg Cap, codeine  she was also prescribed steroids but did not take  ROS No fevers Some runny nose for 2 or 3 days, clear nasal discharge. No sore throat admits to pain and swelling of the pretibial area. No calf pain   Current Medications (verified): 1)  Mvi 2)  B-Pollen 3)  Astepro 0.15 % Soln (Azelastine Hcl) .... 2 Sprays  On Each Side of The Nose Twice A Day For 2 Weeks  Allergies (verified): 1)  ! Amoxicillin  Past History:  Past Medical History: Reviewed history from 10/31/2010 and no changes required. g4 p2 miscarriage x 1,  ectopic pregnancy x1  (R)  1995  chronic RLQ pain  since 1995 when she had a ectopic preg. (R) had a hysterectomy  2 -2011 d/t pelvic congestion; did not help the chronic R sided pain  her  gynecologist prescribed Cymbalta  Past Surgical History: Reviewed history from 07/06/2010 and no changes required. Tubal ligation Breast reduction 12-09 NEGATIVE COLONOSCOPY 2001, AND 2006 hysterectomy, R oophorectomy 12-2009 [facility]  Social History: Reviewed history from 10/31/2010 and no changes required. Married 2 kids (14 and 32) works at [employer], sedentary,  [employer], broadcasting/film/video  tobacco-- no ETOH--no diet-- improved exercise -- some better   Physical Exam  General:  alert, well-developed, and overweight-appearing.  NAD Head:  face symmetric, nontender at the maxillary sinuses. Slightly tender in the frontal sinuses Eyes:  EOMI Neck:  full ROM.   Lungs:  normal respiratory effort, no intercostal retractions, no accessory muscle use, and normal breath sounds.   Heart:  normal rate, regular rhythm, and no murmur.   Neurologic:  alert & oriented X3, cranial nerves II-XII intact, strength normal in all extremities, gait normal, and DTRs symmetrical and normal.     Impression & Recommendations:  Problem # 1:  A/P: presents w/ HA, nasal congestion, CP, increased BP the CP resolved, was atypical, EKG today w/o acute changes HA may be from sinusitis or increased BP plan: finish Abx  agree w/ prednisone low salt diet CT head  will call if symptoms severe  re-asses 2 weeks   Problem # 2:  CHEST PAIN (  ICD-786.50) see #1  observation for now  Problem # 3:  HEADACHE (ICD-784.0)  same #1, schedule a CT of the head  Orders: Radiology Referral (Radiology)  Complete Medication List: 1)  Mvi  2)  B-pollen  3)  Astepro 0.15 % Soln (Azelastine hcl) .... 2 sprays  on each side of the nose twice a day for 2 weeks  Patient Instructions: 1)  Please schedule a follow-up appointment in 2 weeks.    Orders Added: 1)  Est. Patient Level IV [81191] 2)  Radiology Referral [Radiology]

## 2011-01-05 ENCOUNTER — Telehealth (INDEPENDENT_AMBULATORY_CARE_PROVIDER_SITE_OTHER): Payer: Self-pay | Admitting: *Deleted

## 2011-01-05 ENCOUNTER — Emergency Department (HOSPITAL_COMMUNITY)
Admission: EM | Admit: 2011-01-05 | Discharge: 2011-01-06 | Disposition: A | Discharge status: Home / Self Care | Payer: BC Managed Care – PPO | Attending: Emergency Medicine | Admitting: Emergency Medicine

## 2011-01-05 DIAGNOSIS — H6120 Impacted cerumen, unspecified ear: Secondary | ICD-10-CM | POA: Insufficient documentation

## 2011-01-05 DIAGNOSIS — R51 Headache: Secondary | ICD-10-CM | POA: Insufficient documentation

## 2011-01-05 DIAGNOSIS — H9209 Otalgia, unspecified ear: Secondary | ICD-10-CM | POA: Insufficient documentation

## 2011-01-09 ENCOUNTER — Encounter: Payer: Self-pay | Admitting: Internal Medicine

## 2011-01-09 NOTE — Progress Notes (Signed)
Summary: refill  Phone Note Refill Request Message from:  Fax from Pharmacy on January 01, 2011 10:21 AM  Refills Requested: Medication #1:  ASTEPRO 0.15 % SOLN 2 sprays  on each side of the nose twice a day for 2 weeks. [facility] [address] [facility] [place] [id number]  Initial call taken by: Okey [place]al [place],  January 01, 2011 10:22 AM  Follow-up for Phone Call        It was prescribed for only 2 weeks in Dec--okay to give refills?  Follow-up by: Army Fossa CMA,  January 01, 2011 10:38 AM  Additional Follow-up for Phone Call Additional follow up Details #1::        ok 1 and 6 RF Kingsley Herandez E. Johnnette Laux MD  January 01, 2011 2:48 PM     Prescriptions: ASTEPRO 0.15 % SOLN (AZELASTINE HCL) 2 sprays  on each side of the nose twice a day for 2 weeks  #1 x 6   Entered by:   Army Fossa CMA   Authorized by:   Nolon Rod. Nicko Daher MD   Signed by:   Army Fossa CMA on 01/01/2011   Method used:   [facility]        [facility]  [place] (retail)       [address]       [place], [place]  [address]       Ph: [phone] or [phone]       Fax: [phone]   RxID:   [id number]

## 2011-01-10 ENCOUNTER — Encounter: Payer: Self-pay | Admitting: Internal Medicine

## 2011-01-10 ENCOUNTER — Ambulatory Visit (INDEPENDENT_AMBULATORY_CARE_PROVIDER_SITE_OTHER): Payer: BC Managed Care – PPO | Admitting: Internal Medicine

## 2011-01-10 DIAGNOSIS — Z111 Encounter for screening for respiratory tuberculosis: Secondary | ICD-10-CM

## 2011-01-10 DIAGNOSIS — R51 Headache: Secondary | ICD-10-CM

## 2011-01-15 LAB — CONVERTED CEMR LAB
Hep B Core Total Ab: NEGATIVE
Rubella: 87.8 intl units/mL — ABNORMAL HIGH

## 2011-01-18 ENCOUNTER — Encounter (INDEPENDENT_AMBULATORY_CARE_PROVIDER_SITE_OTHER): Payer: Self-pay | Admitting: *Deleted

## 2011-01-18 NOTE — Progress Notes (Signed)
Summary: 2-24,2-27--still with headache  Phone Note Call from Patient Call back at [phone]   Caller: Patient Summary of Call: PT left VM that she still continue to have headache despite finishing antibiotics for sinus infection. Pt would like a return call back to see what the next step is. Left message to call office, per last OV Pt to f/u in 2 week which is next week. Pt with no pending appt..........Marland KitchenFelecia Deloach CMA  January 05, 2011 3:00 PM   Pt return call left message to call office............Marland KitchenFelecia Deloach CMA  January 05, 2011 4:29 PM   Left message to call office ............Marland KitchenFelecia Deloach CMA  January 08, 2011 9:01 AM   Follow-up for Phone Call        Pt is in office today seeing Dr.Paz. Army Fossa CMA  January 10, 2011 10:50 AM

## 2011-01-18 NOTE — Assessment & Plan Note (Signed)
Summary: needs school health form filled out, hearing, vision, tb test...   Vital Signs:  Patient profile:   40 year old female Weight:      212.13 pounds Pulse rate:   76 / minute Pulse rhythm:   regular BP sitting:   126 / 84  (left arm) Cuff size:   large  Vitals Entered By: Army Fossa CMA (January 10, 2011 10:27 AM) CC: Needs health form filled out Comments still having HA's taking acetaminophen w/ [facility] [place]   Vision Screening:Left eye w/o correction: 20 / 40 Right Eye w/o correction: 20 / 40 Both eyes w/o correction:  20/ 40 Left eye with correction: 20 / 25 Right eye with correction: 20 / 25 Both eyes with correction: 20 / 25        Vision Entered By: Army Fossa CMA (January 10, 2011 10:29 AM)   History of Present Illness:  Needs health form completed  Still having headaches,  located behind the eyes and between the eyes, the headache is steady , not really associated with nausea.  she still have mild nasal congestion.   She also had issues with elevated BP, her ambulatory blood pressures are now normal.   she also had chest pains, no further episodes.  Current Medications (verified): 1)  Mvi 2)  B-Pollen 3)  Astepro 0.15 % Soln (Azelastine Hcl) .... 2 Sprays  On Each Side of The Nose Twice A Day For 2 Weeks 4)  Capital/codeine 120-12 Mg/40ml Susp (Acetaminophen-Codeine)  Allergies (verified): 1)  ! Amoxicillin  Past History:  Past Medical History: g4 p2 miscarriage x 1,  ectopic pregnancy x1  (R)  1995 chronic RLQ pain  since 1995 when she had a ectopic preg. (R) had a hysterectomy  2 -2011 d/t pelvic congestion; did not help the chronic R sided pain  her gynecologist prescribed Cymbalta  Past Surgical History: Reviewed history from 07/06/2010 and no changes required. Tubal ligation Breast reduction 12-09 NEGATIVE COLONOSCOPY 2001, AND 2006 hysterectomy, R oophorectomy 12-2009 [facility]  Review of Systems General:   Denies fever. Resp:  Denies sputum productive; occasionally has dry cough. GI:  Denies diarrhea and vomiting. GU:  Denies dysuria and hematuria.  Physical Exam  General:  alert and well-developed.   Head:  face symmetric, nontender at the maxillary sinuses. Slightly tender in the frontal sinuses Lungs:  normal respiratory effort, no intercostal retractions, no accessory muscle use, and normal breath sounds.   Heart:  normal rate, regular rhythm, and no murmur.   Neurologic:  alert & oriented X3, cranial nerves II-XII intact, strength normal in all extremities, and gait normal.     Impression & Recommendations:  Problem # 1:  Form ;... will check hep B and MMR titers PPD pending will sign w/  results   Problem # 2:  HEADACHE (ICD-784.0) persistent HA, recent CT (-), BP is now normal due to location of HA I still suspect a sinus issue (ethmoidal sinusitis not seen in CT?) plan: steroids, bactrim (allergic to PCN) if no better consider ENT or neuro referal +/- dedicated sinus CT Her updated medication list for this problem includes:    Capital/codeine 120-12 Mg/72ml Susp (Acetaminophen-codeine)  Complete Medication List: 1)  Mvi  2)  B-pollen  3)  Astepro 0.15 % Soln (Azelastine hcl) .... 2 sprays  on each side of the nose twice a day for 2 weeks 4)  Capital/codeine 120-12 Mg/26ml Susp (Acetaminophen-codeine) 5)  Prednisone 10 Mg Tabs (Prednisone) .... 4 by  mouth once daily x 2, 3x2,2x2,1x2 6)  Bactrim Ds 800-160 Mg Tabs (Sulfamethoxazole-trimethoprim) .[name].. 1 by mouth two times a day  Other Orders: TB Skin Test [id number]) Admin 1st Vaccine ([address]) T-Hepatitis B Core Antibody [phone]) T-Measles (Rubeola) Antibody IgG (10272-53664) T-Mumps Virus Antibody, IgM (40347-42595) T-Rubella Antibody (63875-64332)  Patient Instructions: 1)  take meds as prescribed for headache (sinusitis?) 2)  call if the heaache not better in 2 weeks 3)  Please schedule a follow-up appointment in  4 months .   Prescriptions: BACTRIM DS 800-160 MG TABS (SULFAMETHOXAZOLE-TRIMETHOPRIM) 1 by mouth two times a day  #20 x 0   Entered and Authorized by:   Nolon Rod. Laylaa Guevarra MD   Signed by:   Nolon Rod. Tayona Sarnowski MD on 01/10/2011   Method used:   Print then Give to Patient   RxID:   [id number] PREDNISONE 10 MG TABS (PREDNISONE) 4 by mouth once daily x 2, 3x2,2x2,1x2  #20 x 0   Entered and Authorized by:   Nolon Rod. Grethel Zenk MD   Signed by:   Nolon Rod. Rosanna Bickle MD on 01/10/2011   Method used:   Print then Give to Patient   RxID:   [id number]    Orders Added: 1)  TB Skin Test [86580] 2)  Admin 1st Vaccine [90471] 3)  T-Hepatitis B Core Antibody [27062-37628] 4)  T-Measles (Rubeola) Antibody IgG [31517-61607] 5)  T-Mumps Virus Antibody, IgM [37106-26948] 6)  T-Rubella Antibody [54627-03500] 7)  Est. Patient Level III [93818]   Immunizations Administered:  PPD Skin Test:    Vaccine Type: PPD    Site: right forearm    Mfr: Sanofi Pasteur    Dose: 0.1 ml    Route: ID    Given by: Army Fossa CMA    Exp. Date: 01/24/2013    Lot #: [id number]   Immunizations Administered:  PPD Skin Test:    Vaccine Type: PPD    Site: right forearm    Mfr: Sanofi Pasteur    Dose: 0.1 ml    Route: ID    Given by: Army Fossa CMA    Exp. Date: 01/24/2013    Lot #: [id number]

## 2011-01-23 NOTE — Letter (Signed)
Summary: Health Examination Certificate  Health Examination Certificate   Imported By: Maryln Gottron 01/16/2011 10:23:02  _____________________________________________________________________  External Attachment:    Type:   Image     Comment:   External Document

## 2011-01-23 NOTE — Letter (Signed)
Summary: Primary Care Appointment Letter  [facility]  [address] [place], [place] [address]   Phone: [phone]  Fax: [phone]    01/18/2011 MRN: [id number]  [facility] [address] [place], [place]  [address]  [place]  Dear [facility],   Your Primary Care Physician [place] E. Paz MD has indicated that:    __X_____it is time to schedule an appointment. (You need Hepatitis B Shots)     _______you missed your appointment on______ and need to call and          reschedule.    _______you need to have lab work done.    _______you need to schedule an appointment discuss lab or test results.    _______you need to call to reschedule your appointment that is                       scheduled on _________.     Please call our office as soon as possible. Our phone number is [phone]_________. Please press option 1. Our office is open 8a-12noon and 1p-5p, Monday through Friday.     Thank you,    [facility]

## 2011-01-31 ENCOUNTER — Ambulatory Visit: Payer: BC Managed Care – PPO | Admitting: *Deleted

## 2011-01-31 DIAGNOSIS — Z23 Encounter for immunization: Secondary | ICD-10-CM

## 2011-04-13 ENCOUNTER — Encounter: Payer: Self-pay | Admitting: Internal Medicine

## 2011-04-13 ENCOUNTER — Ambulatory Visit (INDEPENDENT_AMBULATORY_CARE_PROVIDER_SITE_OTHER): Payer: BC Managed Care – PPO | Admitting: Internal Medicine

## 2011-04-13 VITALS — BP 130/76 | HR 71 | Wt 206.4 lb

## 2011-04-13 DIAGNOSIS — R51 Headache: Secondary | ICD-10-CM

## 2011-04-13 DIAGNOSIS — Z23 Encounter for immunization: Secondary | ICD-10-CM

## 2011-04-13 MED ORDER — HEPATITIS B VAC RECOMBINANT 10 MCG/ML IJ SUSP
0.5000 mL | Freq: Once | INTRAMUSCULAR | Status: AC
  Administered 2011-04-13: 5 ug via INTRAMUSCULAR

## 2011-04-13 NOTE — Progress Notes (Signed)
  Subjective:    Patient ID: Desiree Dunn, female    DOB: [birth year], 40 y.o.   MRN: [id number]  HPI Ongoing problems with headaches for the last 6 or 7 months. In the past she has been in the ER twice. Headache is on and off, latest episode started a week ago. Pain is deep between the eyes and steady. She had a CT of the head which was negative in February 2012, she was prescribed empiric antibiotics for possible sinusitis. That did not help much.  Past Medical History  Diagnosis Date  . Miscarriage     x 1  . Ectopic pregnancy     x 1   . Chronic RLQ pain     since 1995 when she had ectopic preg  . Pelvic congestion     did not help the chronic R sided pain   Past Surgical History  Procedure Date  . Tubal ligation   . Breast reduction surgery 12/09  . Abdominal hysterectomy 12/2009    [facility]  . Right oophorectomy 12/2009    [facility]      Review of Systems No runny nose or sore throat. Some postnasal dripping. No nausea or vomiting      Objective:   Physical Exam  Constitutional: She is oriented to person, place, and time. She appears well-developed and well-nourished. No distress.  HENT:  Head: Normocephalic and atraumatic.       Slightly tender to palpation at the mid forehead, not tender at the maxillary sinuses  Cardiovascular: Normal rate, regular rhythm and normal heart sounds.   No murmur heard. Pulmonary/Chest: Effort normal and breath sounds normal. No respiratory distress. She has no wheezes.  Neurological: She is alert and oriented to person, place, and time. No cranial nerve deficit. She exhibits normal muscle tone. Coordination normal.  Skin: She is not diaphoretic.  Psychiatric: She has a normal mood and affect. Her behavior is normal. Judgment and thought content normal.          Assessment & Plan:

## 2011-04-13 NOTE — Assessment & Plan Note (Addendum)
Persistent headaches, we discussed a ENT versus neurology referral. Given the location of the headache and a postnasal drip , we agreed to refer to ENT.

## 2011-05-10 ENCOUNTER — Telehealth: Payer: Self-pay | Admitting: Internal Medicine

## 2011-05-10 DIAGNOSIS — R519 Headache, unspecified: Secondary | ICD-10-CM

## 2011-05-10 NOTE — Telephone Encounter (Signed)
Yes , we can use Dr Clarisse Gouge or  [facility] or [place]

## 2011-05-10 NOTE — Telephone Encounter (Signed)
Okay to refer to neurology

## 2011-05-14 ENCOUNTER — Encounter: Payer: Self-pay | Admitting: Internal Medicine

## 2011-07-24 ENCOUNTER — Other Ambulatory Visit: Payer: Self-pay | Admitting: Obstetrics and Gynecology

## 2011-07-24 DIAGNOSIS — Z1231 Encounter for screening mammogram for malignant neoplasm of breast: Secondary | ICD-10-CM

## 2011-08-02 ENCOUNTER — Ambulatory Visit
Admission: RE | Admit: 2011-08-02 | Discharge: 2011-08-02 | Disposition: A | Discharge status: Home / Self Care | Payer: BC Managed Care – PPO | Source: Ambulatory Visit | Attending: Obstetrics and Gynecology | Admitting: Obstetrics and Gynecology

## 2011-08-02 DIAGNOSIS — Z1231 Encounter for screening mammogram for malignant neoplasm of breast: Secondary | ICD-10-CM

## 2011-09-28 ENCOUNTER — Encounter: Payer: Self-pay | Admitting: Internal Medicine

## 2011-09-28 ENCOUNTER — Other Ambulatory Visit: Payer: Self-pay | Admitting: Internal Medicine

## 2011-09-28 ENCOUNTER — Ambulatory Visit (INDEPENDENT_AMBULATORY_CARE_PROVIDER_SITE_OTHER): Payer: BC Managed Care – PPO | Admitting: Internal Medicine

## 2011-09-28 VITALS — BP 120/82 | HR 81 | Temp 98.4°F | Ht 64.5 in | Wt 212.8 lb

## 2011-09-28 DIAGNOSIS — R350 Frequency of micturition: Secondary | ICD-10-CM

## 2011-09-28 DIAGNOSIS — R1032 Left lower quadrant pain: Secondary | ICD-10-CM

## 2011-09-28 LAB — POCT URINALYSIS DIPSTICK
Glucose, UA: NEGATIVE
Spec Grav, UA: 1.01
Urobilinogen, UA: 0.2

## 2011-09-28 MED ORDER — DICYCLOMINE HCL 10 MG PO CAPS: 10.0000 mg | ORAL_CAPSULE | Freq: Four times a day (QID) | ORAL | Status: AC

## 2011-09-28 NOTE — Patient Instructions (Signed)
Bentyl as needed for pain Also fluids, bland diet. Call if not better in a few days ER if symptoms  severe or fever

## 2011-09-28 NOTE — Progress Notes (Signed)
  Subjective:    Patient ID: Desiree Dunn, female    DOB: May 02, 1971, 40 y.o.   MRN: [id number]  HPI 5 days  ago developed left lower quadrant abdominal discomfort,pain is steady, does not change with food intake. Shortly after she had watery diarrhea for 2 days. The abdominal pain has decreased but is not completely gone. Diarrhea is resolved. She has also developed some urinary frequency and urgency. She has had chronic right lower quadrant abdominal discomfort and low back pain, they are unchanged.   Past Medical History: g4 p2 miscarriage x 1,  ectopic pregnancy x1  (R)  1995 chronic RLQ pain  since 1995 when she had a ectopic preg. (R) had a hysterectomy  2 -2011 d/t pelvic congestion; did not help the chronic R sided pain  her gynecologist prescribed Cymbalta  Past Surgical History: Tubal ligation Breast reduction 12-09 NEGATIVE COLONOSCOPY 2001,  2006, 2011 (ascending colon tics) hysterectomy, R oophorectomy 12-2009 [facility]   Review of Systems No fever or chills No nausea or vomiting No blood in the stools She does have a bloated feeling in the lower abdomen. Appetite is normal No change in the color in both the urine or gross hematuria.     Objective:   Physical Exam  Constitutional: She appears well-developed and well-nourished.  HENT:  Head: Normocephalic and atraumatic.  Abdominal:       Nondistended, soft, good bowel sounds, no mass no rebound. Slightly tender at both sides of the lower abdomen, that is normal for her on the right but is new on the left. No CVA tenderness       Assessment & Plan:  Left lower quadrant abdominal pain: Patient is afebrile. She does have some urinary symptoms and the u dip showed trace of blood but overall, I think the pain is related to acute episode of diarrhea that is resolving. Other considerations are at UTI or even diverticulitis. Plan: Urine culture bentyl ER if symptoms of year Antibiotics if urine  culture positive

## 2011-09-30 LAB — URINE CULTURE

## 2011-10-01 ENCOUNTER — Telehealth: Payer: Self-pay

## 2011-10-01 NOTE — Telephone Encounter (Signed)
Left message on personally identified voicemail to notify cx negative

## 2011-10-01 NOTE — Telephone Encounter (Signed)
Message copied by Beverely Low on Mon Oct 01, 2011  5:03 PM ------      Message from: Desiree Dunn      Created: Mon Oct 01, 2011  1:02 PM       Advise patient:       urine culture negative

## 2011-10-25 ENCOUNTER — Ambulatory Visit (INDEPENDENT_AMBULATORY_CARE_PROVIDER_SITE_OTHER): Payer: BC Managed Care – PPO

## 2011-10-25 DIAGNOSIS — Z23 Encounter for immunization: Secondary | ICD-10-CM

## 2011-11-14 ENCOUNTER — Encounter: Payer: Self-pay | Admitting: Internal Medicine

## 2011-11-14 ENCOUNTER — Ambulatory Visit (INDEPENDENT_AMBULATORY_CARE_PROVIDER_SITE_OTHER): Payer: PRIVATE HEALTH INSURANCE | Admitting: Internal Medicine

## 2011-11-14 VITALS — BP 118/82 | HR 88 | Temp 98.6°F | Ht 66.0 in | Wt 214.0 lb

## 2011-11-14 DIAGNOSIS — J069 Acute upper respiratory infection, unspecified: Secondary | ICD-10-CM

## 2011-11-14 DIAGNOSIS — L723 Sebaceous cyst: Secondary | ICD-10-CM

## 2011-11-14 NOTE — Progress Notes (Signed)
  Subjective:    Patient ID: Desiree Dunn, female    DOB: 09/14/1971, 40 y.o.   MRN: [id number]  HPI Acute visit "Kont" at the L armpit x 2 weeks No d/c, some pain, no change in size since onset of sx  Also nasal congestion x 3 days  Past Medical History:  g4 p2 miscarriage x 1, ectopic pregnancy x1 (R) 1995  chronic RLQ pain since 1995 when she had a ectopic preg. (R)  had a hysterectomy 2 -2011 d/t pelvic congestion; did not help the chronic R sided pain  her gynecologist prescribed Cymbalta   Past Surgical History:  Tubal ligation  Breast reduction 12-09  NEGATIVE COLONOSCOPY 2001, 2006, 2011 (ascending colon tics)  hysterectomy, R oophorectomy 12-2009 [facility]   Review of Systems No F/C Mild stuffy nose and  RN Some cough w/o sputum    Objective:   Physical Exam  Constitutional: She appears well-developed. No distress.  HENT:  Head: Normocephalic and atraumatic.       Face symetric, mild tenderness to palpation @ all sinuses, nose congested , throat normal  Cardiovascular: Normal rate, regular rhythm and normal heart sounds.   No murmur heard. Pulmonary/Chest: Effort normal and breath sounds normal. No respiratory distress. She has no wheezes. She has no rales.  Skin: She is not diaphoretic.       L armpit: superficial 1/3 cm mass , noted sebaceous material to palpation, no red or d/c , no fluctuant       Assessment & Plan:  URI-- see instructions Sebaceus cyst-- rec observation, call if gets bigger, tender, hot

## 2011-11-14 NOTE — Patient Instructions (Signed)
Rest, fluids , tylenol For cough, take Mucinex DM twice a day as needed  Call if no better in few days Call anytime if the symptoms are severe  

## 2012-03-19 ENCOUNTER — Ambulatory Visit (INDEPENDENT_AMBULATORY_CARE_PROVIDER_SITE_OTHER): Payer: PRIVATE HEALTH INSURANCE | Admitting: Internal Medicine

## 2012-03-19 DIAGNOSIS — Z0000 Encounter for general adult medical examination without abnormal findings: Secondary | ICD-10-CM | POA: Insufficient documentation

## 2012-03-19 NOTE — Progress Notes (Signed)
  Subjective:    Patient ID: Desiree Dunn, female    DOB: 08/02/1971, 41 y.o.   MRN: [id number]  HPI CPX  Past Medical History:  g4 p2 miscarriage x 1, ectopic pregnancy x1 (R) 1995  chronic RLQ pain since 1995 when she had a ectopic preg. (R)  had a hysterectomy 2 -2011 d/t pelvic congestion; did not help the chronic R sided pain  her gynecologist prescribed Cymbalta -- no help, d/c , better as of 03-2012  Past Surgical History:  Tubal ligation  Breast reduction 12-09  NEGATIVE COLONOSCOPY 2001, 2006, 2011 (ascending colon tics)  hysterectomy, R oophorectomy 12-2009 [facility]  Family History: stroke--F   DM -- F  MI--no HTN--F  S colon ca-- mom  Dx in her 28'S Breast ca-- GM   Social History: Married, 2 kids (15 and 79) works at [employer],  [employer], broadcasting/film/video  tobacco-- no ETOH--no diet-- improved exercise -- some better   Review of Systems Complains of occasional pretibial edema (like a dent in the socks) Occasional tingling in her hands, she thinks related to typing several hours a day. Sporadically ("every blue moon") has a sharp anterior chest pain, usually without exertion, no radiation, last a few seconds.. Denies any nausea, vomiting, diarrhea, or GERD symptoms [place] depression. Occasionally has aches and pains at her hips and back without radiation. Has not noticed any wrist or hands swelling.    Objective:   Physical Exam  General -- alert, well-developed, and slightly overweight appearing.   Neck --no thyromegaly , normal carotid pulse, no LADs Lungs -- normal respiratory effort, no intercostal retractions, no accessory muscle use, and normal breath sounds.   Heart-- normal rate, regular rhythm, no murmur, and no gallop.   Abdomen--soft, non-tender, no distention, no masses, no HSM, no guarding, and no rigidity.   Extremities-- no pretibial edema bilaterally; hands and wrist normal to inspection and palpation, no synovitis. Neurologic-- alert & oriented X3  and strength normal in all extremities. Psych-- Cognition and judgment appear intact. Alert and cooperative with normal attention span and concentration.  not anxious appearing and not depressed appearing.       Assessment & Plan:

## 2012-03-19 NOTE — Patient Instructions (Signed)
Please come back fasting: FLP, CMP, TSH, CBC--- dx v70 ------ Remain active, exercise and stretching, Motrin from time to time, if the aches continue, let me know

## 2012-03-19 NOTE — Assessment & Plan Note (Addendum)
Td 2004  female care per gyn   Cscope 2001, 2006 and again 11-2009 d/t  rectal bleed, neg except for tics----- next 5 years  Occasional chest pain, quite atypical, EKG today nsr Occasional edema, rec low Na diet Diet exercise discussed occ aches, see instructions  labs

## 2012-03-20 ENCOUNTER — Encounter: Payer: Self-pay | Admitting: Internal Medicine

## 2012-03-26 ENCOUNTER — Other Ambulatory Visit (INDEPENDENT_AMBULATORY_CARE_PROVIDER_SITE_OTHER): Payer: PRIVATE HEALTH INSURANCE

## 2012-03-26 DIAGNOSIS — Z0000 Encounter for general adult medical examination without abnormal findings: Secondary | ICD-10-CM

## 2012-03-26 LAB — CBC WITH DIFFERENTIAL/PLATELET
Basophils Relative: 0.5 % (ref 0.0–3.0)
Eosinophils Relative: 1.6 % (ref 0.0–5.0)
HCT: 39.5 % (ref 36.0–46.0)
Hemoglobin: 13.3 g/dL (ref 12.0–15.0)
Lymphs Abs: 1.7 10*3/uL (ref 0.7–4.0)
MCV: 91.5 fl (ref 78.0–100.0)
Monocytes Absolute: 0.4 10*3/uL (ref 0.1–1.0)
Monocytes Relative: 7.8 % (ref 3.0–12.0)
Platelets: 188 10*3/uL (ref 150.0–400.0)
RBC: 4.32 Mil/uL (ref 3.87–5.11)
WBC: 4.7 10*3/uL (ref 4.5–10.5)

## 2012-03-26 LAB — COMPREHENSIVE METABOLIC PANEL
Alkaline Phosphatase: 55 U/L (ref 39–117)
BUN: 10 mg/dL (ref 6–23)
Creatinine, Ser: 0.8 mg/dL (ref 0.4–1.2)
Glucose, Bld: 84 mg/dL (ref 70–99)
Sodium: 138 mEq/L (ref 135–145)
Total Bilirubin: 0.7 mg/dL (ref 0.3–1.2)

## 2012-03-26 LAB — LIPID PANEL
Cholesterol: 189 mg/dL (ref 0–200)
HDL: 68.2 mg/dL (ref >39.00)
VLDL: 23.6 mg/dL (ref 0.0–40.0)

## 2012-03-26 LAB — TSH: TSH: 0.6 u[IU]/mL (ref 0.35–5.50)

## 2012-03-26 NOTE — Progress Notes (Signed)
Labs only

## 2012-03-31 ENCOUNTER — Encounter: Payer: Self-pay | Admitting: *Deleted

## 2012-06-23 ENCOUNTER — Other Ambulatory Visit: Payer: Self-pay | Admitting: Obstetrics and Gynecology

## 2012-06-23 DIAGNOSIS — Z1231 Encounter for screening mammogram for malignant neoplasm of breast: Secondary | ICD-10-CM

## 2012-08-04 ENCOUNTER — Ambulatory Visit
Admission: RE | Admit: 2012-08-04 | Discharge: 2012-08-04 | Disposition: A | Discharge status: Home / Self Care | Payer: BC Managed Care – PPO | Source: Ambulatory Visit | Attending: Obstetrics and Gynecology | Admitting: Obstetrics and Gynecology

## 2012-08-04 DIAGNOSIS — Z1231 Encounter for screening mammogram for malignant neoplasm of breast: Secondary | ICD-10-CM

## 2012-08-28 ENCOUNTER — Ambulatory Visit (INDEPENDENT_AMBULATORY_CARE_PROVIDER_SITE_OTHER): Payer: BC Managed Care – PPO

## 2012-08-28 DIAGNOSIS — Z23 Encounter for immunization: Secondary | ICD-10-CM

## 2012-11-26 ENCOUNTER — Encounter (HOSPITAL_COMMUNITY): Payer: Self-pay | Admitting: *Deleted

## 2012-11-26 ENCOUNTER — Emergency Department (HOSPITAL_COMMUNITY)
Admission: EM | Admit: 2012-11-26 | Discharge: 2012-11-26 | Disposition: A | Discharge status: Nursing Home (Includes ICF) | Payer: BC Managed Care – PPO | Source: Home / Self Care | Attending: Family Medicine | Admitting: Family Medicine

## 2012-11-26 ENCOUNTER — Encounter (HOSPITAL_COMMUNITY): Payer: Self-pay | Admitting: Emergency Medicine

## 2012-11-26 ENCOUNTER — Emergency Department (HOSPITAL_COMMUNITY): Payer: BC Managed Care – PPO

## 2012-11-26 ENCOUNTER — Telehealth: Payer: Self-pay | Admitting: Internal Medicine

## 2012-11-26 ENCOUNTER — Emergency Department (HOSPITAL_COMMUNITY)
Admission: EM | Admit: 2012-11-26 | Discharge: 2012-11-27 | Disposition: A | Discharge status: Home / Self Care | Payer: BC Managed Care – PPO | Attending: Emergency Medicine | Admitting: Emergency Medicine

## 2012-11-26 DIAGNOSIS — R209 Unspecified disturbances of skin sensation: Secondary | ICD-10-CM | POA: Insufficient documentation

## 2012-11-26 DIAGNOSIS — Z8719 Personal history of other diseases of the digestive system: Secondary | ICD-10-CM | POA: Insufficient documentation

## 2012-11-26 DIAGNOSIS — R1031 Right lower quadrant pain: Secondary | ICD-10-CM | POA: Insufficient documentation

## 2012-11-26 DIAGNOSIS — R072 Precordial pain: Secondary | ICD-10-CM | POA: Insufficient documentation

## 2012-11-26 DIAGNOSIS — K219 Gastro-esophageal reflux disease without esophagitis: Secondary | ICD-10-CM | POA: Insufficient documentation

## 2012-11-26 DIAGNOSIS — R079 Chest pain, unspecified: Secondary | ICD-10-CM

## 2012-11-26 DIAGNOSIS — G5600 Carpal tunnel syndrome, unspecified upper limb: Secondary | ICD-10-CM | POA: Insufficient documentation

## 2012-11-26 DIAGNOSIS — Z8742 Personal history of other diseases of the female genital tract: Secondary | ICD-10-CM | POA: Insufficient documentation

## 2012-11-26 DIAGNOSIS — G8929 Other chronic pain: Secondary | ICD-10-CM | POA: Insufficient documentation

## 2012-11-26 DIAGNOSIS — Z79899 Other long term (current) drug therapy: Secondary | ICD-10-CM | POA: Insufficient documentation

## 2012-11-26 HISTORY — DX: Diverticulitis of intestine, part unspecified, without perforation or abscess without bleeding: K57.92

## 2012-11-26 LAB — CBC
HCT: 39 % (ref 36.0–46.0)
MCHC: 34.6 g/dL (ref 30.0–36.0)
MCV: 88.6 fL (ref 78.0–100.0)
RDW: 13.2 % (ref 11.5–15.5)

## 2012-11-26 LAB — BASIC METABOLIC PANEL
BUN: 7 mg/dL (ref 6–23)
Chloride: 102 mEq/L (ref 96–112)
Creatinine, Ser: 0.73 mg/dL (ref 0.50–1.10)
GFR calc Af Amer: >90 mL/min (ref >90)

## 2012-11-26 LAB — POCT I-STAT TROPONIN I

## 2012-11-26 MED ORDER — GI COCKTAIL ~~LOC~~
30.0000 mL | Freq: Once | ORAL | Status: AC
  Administered 2012-11-26: 30 mL via ORAL

## 2012-11-26 MED ORDER — GI COCKTAIL ~~LOC~~
ORAL | Status: AC
  Filled 2012-11-26: qty 30

## 2012-11-26 NOTE — ED Notes (Signed)
Pt st's this pain started in her R wrist and arm when she was at work typing, st's the pain moved up into her chest.  Pt describes chest pain as burning, st's the GI cocktail given to her at [facility] did help the chest pain.

## 2012-11-26 NOTE — Telephone Encounter (Signed)
Patient Information:  Caller Name: Tensley  Phone: [phone]  Patient: Desiree Dunn  Gender: Female  DOB: 10-Sep-1971  Age: 42 Years  PCP: Willow Ora  Pregnant: No  Office Follow Up:  Does the office need to follow up with this patient?: Yes  Instructions For The Office: FYI:  Patient recommended to go to ED via 911 but declined.  Has agreed to go to [facility] or ED.  RN Note:  Had a salad for lunch and stated a few minutes later to feel bad.  Having chest pain she rates at 4/10 pain scale-burning like in nature; and right arm pain rated at 9/10 on pain scale -states right hand feels numb.  Denies emergent symptoms but is anxious of the pain.  Refused 911 due to chest pain lasting > than 5 minutes with 2 or more risk factors.  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  RN did not want her to exert self but walking to her office next door.  BP 124/98.regular pulse and per nurse did not appear to be in distress.  In triage patient continued to triage to got to ED now or to office if approved.  No appoints available at office.  Patient finally agreed to go be seen. Patient was instructed to have someone to take her.  Patient did not want to call attention to herself. Patient will got to [facility] or ED.  Caller demonstrated understanding of advice and concerns.  At end of conversation patient stated she was calmer but pain levels of chest and arm remain unchanged.  Symptoms  Reason For Call & Symptoms: Pain in right arm with a dull chest pain.  Reviewed Health History In EMR: Yes  Reviewed Medications In EMR: Yes  Reviewed Allergies In EMR: Yes  Reviewed Surgeries / Procedures: Yes  Date of Onset of Symptoms: 11/26/2012  Treatments Tried: 2 tylenol  Treatments Tried Worked: No OB / GYN:  LMP: Unknown  Guideline(s) Used:  Chest Pain  Disposition Per Guideline:   Call EMS 911 Now  Reason For Disposition  Reached:   Chest pain lasting longer than 5 minutes and ANY of the following:  Over 80 years old Over 66 years old and at least one cardiac risk factor (i.e., high blood pressure, diabetes, high cholesterol, obesity, smoker or strong family history of heart disease) Pain is crushing, pressure-like, or heavy  Took nitroglycerin and chest pain was not relieved History of heart disease (i.e., angina, heart attack, bypass surgery, angioplasty, CHF)  Advice Given:  Call Back If:  You become worse.  Patient Refused Recommendation:  Patient Will Go To [facility].  Patient refused 911 by CSR and nurse.  Finally agreed to go to [facility] or ED

## 2012-11-26 NOTE — Telephone Encounter (Signed)
Please advise 

## 2012-11-26 NOTE — Telephone Encounter (Signed)
Patient was recommended to go to the urgent care, please check on her on Friday to see how she's doing.

## 2012-11-26 NOTE — ED Notes (Signed)
Pt c/o chest pain since 1300 Pain occurred right after lunch; pain will increase w/activity and pressure It's intermittent and a dull pain; also c/o constant pain on right arm Denies: headaches, blurry vision, SOB, edema, fevers, vomiting, nauseas, diarrhea Hx of GERD  She is alert and responsive w/no signs of acute distress.

## 2012-11-26 NOTE — ED Provider Notes (Signed)
History     CSN: [id number]  Arrival date & time 11/26/12  1638   First MD Initiated Contact with Patient 11/26/12 1657      Chief Complaint  Patient presents with  . Chest Pain    (Consider location/radiation/quality/duration/timing/severity/associated sxs/prior treatment) HPI Comments: 42 year old female with history of obesity and acid reflux. Here complaining of chest pain described as burning sensation 4/10 in the Center of her chest since 1 PM today after eating lunch.  Symptomsare  associated to right arm pain and right hand paresthesia just prior the beginning of chest pain. Reports her chest discomfort is mild but persistent. Denies nausea or vomiting. No diaphoresis, felt her chest discomfort was worse with inspiration. No abdominal pain or diarrhea Denies similar symptoms in the past. Patient reports she has a history of acid reflux but has not felt acid taste in her mouth. No dizziness. No paroxysmal nocturnal dyspnea. No history of hypertension although patient reports that her blood pressure was elevated today when her symptoms started and she was checked by a school nurse. Reports intermittent low extremity edema in the last few days.   Past Medical History  Diagnosis Date  . Miscarriage     x 1  . Ectopic pregnancy     x 1   . Chronic RLQ pain     since 1995 when she had ectopic preg  . Pelvic congestion     did not help the chronic R sided pain  . Diverticulitis     Past Surgical History  Procedure Date  . Tubal ligation   . Breast reduction surgery 12/09  . Abdominal hysterectomy 12/2009    [facility]  . Right oophorectomy 12/2009    [facility]    Family History  Problem Relation Age of Onset  . Stroke Father   . Diabetes Father   . Heart attack Neg Hx   . Hypertension Father   . Hypertension Sister   . Heart attack    . Colon cancer Mother   . Breast cancer      GM    History  Substance Use Topics  . Smoking status: Never Smoker   .  Smokeless tobacco: Not on file  . Alcohol Use: Yes    OB History    Grav Para Term Preterm Abortions TAB SAB Ect Mult Living                  Review of Systems  Constitutional: Negative for fever, chills and fatigue.  Respiratory: Negative for cough, shortness of breath and wheezing.   Cardiovascular: Positive for chest pain and leg swelling. Negative for palpitations.  Gastrointestinal: Negative for nausea, vomiting and abdominal pain.  Skin: Negative for rash.  Neurological: Negative for dizziness and headaches.  All other systems reviewed and are negative.    Allergies  Amoxicillin  Home Medications   Current Outpatient Rx  Name  Route  Sig  Dispense  Refill  . ACETAMINOPHEN 500 MG PO TABS   Oral   Take 500 mg by mouth every 6 (six) hours as needed.           . AZELASTINE HCL 0.15 % NA SOLN   Nasal   Place 2 sprays into the nose 2 (two) times daily.           . MULTIVITAMINS PO CAPS   Oral   Take 1 capsule by mouth daily.             BP  115/64  Pulse 74  Temp 98 F (36.7 C) (Oral)  Resp 16  SpO2 100%  Physical Exam  Nursing note and vitals reviewed. Constitutional: She is oriented to person, place, and time. She appears well-developed and well-nourished. No distress.  HENT:  Head: Normocephalic and atraumatic.  Mouth/Throat: Oropharynx is clear and moist. No oropharyngeal exudate.  Eyes: Conjunctivae normal are normal.  Neck: Neck supple. No JVD present. No thyromegaly present.  Cardiovascular: Normal rate, regular rhythm, normal heart sounds and intact distal pulses.  Exam reveals no gallop and no friction rub.   No murmur heard.      No low extremity edema  Pulmonary/Chest: Effort normal. No respiratory distress. She has no wheezes. She has no rales. She exhibits no tenderness.  Abdominal: Soft. She exhibits no distension and no mass. There is no tenderness. There is no rebound and no guarding.       Epigastric discomfort with deep palpation    Lymphadenopathy:    She has no cervical adenopathy.  Neurological: She is alert and oriented to person, place, and time.  Skin: No rash noted. She is not diaphoretic.    ED Course  Procedures (including critical care time)  Labs Reviewed - No data to display No results found.   1. Chest pain    EKG: Normal sinus rhythm with a ventricular rate at 76 beats per minutes there is some flattening of the T wave in V1 aVL and the D3 otherwise, no ST changes or other acute ischemic changes.   MDM  42 year old female with history of obesity and acid reflux. Here complaining of chest pain described as burning sensation 4/10 in the Center of her chest since 1 PM today. Symptoms associated to right arm pain I and right hand paresthesia just prior the beginning of chest pain.On exam vital signs normal and stable. Chest pain not reproducible with chest wall palpation. Mild epigastric discomfort on deep palpation. Otherwise normal examination.   Patient had administered GI cocktail oral x1 prior to transfer to the emergency department for further evaluation and management.         Sharin Grave, MD 11/26/12 9604

## 2012-11-26 NOTE — ED Notes (Signed)
Pt c/o central CP with radiation to right arm since this morning.  Describes pain as a burning, was given GI cocktail at [facility].  States she feels the pain is worse with exertion.

## 2012-11-27 MED ORDER — OMEPRAZOLE 20 MG PO CPDR: 20.0000 mg | DELAYED_RELEASE_CAPSULE | Freq: Every day | ORAL | Status: DC

## 2012-11-27 NOTE — ED Provider Notes (Signed)
History     CSN: [id number]  Arrival date & time 11/26/12  1908   First MD Initiated Contact with Patient 11/26/12 2212      Chief Complaint  Patient presents with  . [facility] transfer   . Chest Pain    (Consider location/radiation/quality/duration/timing/severity/associated sxs/prior treatment) HPI Comments: Patient with a history of Acid Reflux presents today with a chief complaint of chest pain.  She reports that the pain came around around 1 PM today after eating lunch.  She describes the pain as a burning pain.  Pain in the sternal region and does not radiate.  She was seen by [facility] prior to arrival in the ED.  She was given a GI cocktail at [facility], which resolved her symptoms.  She is currently not having any chest pain at this time.  She denies any nausea or vomiting.  Denies shortness of breath.  No diaphoresis, dizziness, lightheadedness, or syncope.  No prior cardiac history.  No history of HTN, Hyperlipidemia, or DM.  No FH of early cardiac disease.  No prolonged travel or surgery in the past 4 weeks.  No prior history of DVT or PE.  No lower extremity edema, erythema, or pain.  Patient also reports that she has been having intermittent numbness/tingling of both her right and left upper extremity for the past month.  Symptoms worsen at night.  She does type for a living.    The history is provided by the patient.    Past Medical History  Diagnosis Date  . Miscarriage     x 1  . Ectopic pregnancy     x 1   . Chronic RLQ pain     since 1995 when she had ectopic preg  . Pelvic congestion     did not help the chronic R sided pain  . Diverticulitis     Past Surgical History  Procedure Date  . Tubal ligation   . Breast reduction surgery 12/09  . Abdominal hysterectomy 12/2009    [facility]  . Right oophorectomy 12/2009    [facility]    Family History  Problem Relation Age of Onset  . Stroke Father   . Diabetes Father   . Heart attack Neg Hx   . Hypertension Father   .  Hypertension Sister   . Heart attack    . Colon cancer Mother   . Breast cancer      GM    History  Substance Use Topics  . Smoking status: Never Smoker   . Smokeless tobacco: Not on file  . Alcohol Use: Yes    OB History    Grav Para Term Preterm Abortions TAB SAB Ect Mult Living                  Review of Systems  Constitutional: Negative for fever and chills.  HENT: Negative for neck pain and neck stiffness.   Respiratory: Negative for cough and shortness of breath.   Cardiovascular: Positive for chest pain. Negative for palpitations and leg swelling.  Gastrointestinal: Negative for nausea, vomiting and abdominal pain.  Skin: Negative for rash.  Neurological: Positive for numbness. Negative for dizziness, syncope and light-headedness.  All other systems reviewed and are negative.    Allergies  Amoxicillin  Home Medications   Current Outpatient Rx  Name  Route  Sig  Dispense  Refill  . ACETAMINOPHEN 500 MG PO TABS   Oral   Take 500 mg by mouth every 6 (six) hours  as needed. For pain         . AZELASTINE HCL 0.15 % NA SOLN   Nasal   Place 2 sprays into the nose 2 (two) times daily as needed. For congestion         . ADULT MULTIVITAMIN W/MINERALS CH   Oral   Take 1 tablet by mouth daily.         [name] SALINE NASAL SPRAY 0.65 % NA SOLN   Nasal   Place 1 spray into the nose as needed. For congestion           BP 136/91  Pulse 76  Temp 98.5 F (36.9 C) (Oral)  Resp 16  SpO2 100%  Physical Exam  Nursing note and vitals reviewed. Constitutional: She appears well-developed and well-nourished. No distress.  HENT:  Head: Normocephalic and atraumatic.  Mouth/Throat: Oropharynx is clear and moist.  Neck: Normal range of motion. Neck supple.  Cardiovascular: Normal rate, regular rhythm, normal heart sounds and intact distal pulses.   No murmur heard. Pulmonary/Chest: Effort normal and breath sounds normal. No respiratory distress. She has no wheezes.  She has no rales. She exhibits no tenderness.  Abdominal: Soft. Bowel sounds are normal. She exhibits no distension and no mass. There is no tenderness. There is no rebound and no guarding.  Musculoskeletal: Normal range of motion.       Positive Tinel's sign and Phalen's sign on the right and left  Neurological: She is alert. She has normal strength. No cranial nerve deficit or sensory deficit. Gait normal.  Skin: Skin is warm and dry. She is not diaphoretic.  Psychiatric: She has a normal mood and affect.    ED Course  Procedures (including critical care time)  Labs Reviewed  BASIC METABOLIC PANEL - Abnormal; Notable for the following:    Glucose, Bld 100 (*)     All other components within normal limits  CBC  POCT I-STAT TROPONIN I  POCT I-STAT TROPONIN I   Dg Chest 2 View  11/26/2012  *RADIOLOGY REPORT*  Clinical Data: Chest pain, shortness of breath.  CHEST - 2 VIEW  Comparison: None  Findings: Lungs clear.  Heart size and pulmonary vascularity normal.  No effusion.  Visualized bones unremarkable.  IMPRESSION: No acute disease   Original Report Authenticated By: D. Andria Rhein, MD      No diagnosis found.   Date: 11/27/2012  Rate: 76  Rhythm: normal sinus rhythm  QRS Axis: normal  Intervals: normal  ST/T Wave abnormalities: nonspecific T wave changes  Conduction Disutrbances:none  Narrative Interpretation:   Old EKG Reviewed: none available    MDM  Patient is to be discharged with recommendation to follow up with PCP in regards to today's hospital visit. Chest pain is not likely of cardiac or pulmonary etiology d/t presentation, PERC negative, VSS, no tracheal deviation, no JVD or new murmur, RRR, breath sounds equal bilaterally, EKG without acute abnormalities, negative initial and 3 hour troponin, and negative CXR.   Symptoms resolved after GI cocktail.  Therefore, feel that symptoms are related to GERD.  Pt has been advised start a PPI.   Return precautions discussed  with the patient.  Pt appears reliable for follow up and is agreeable to discharge.           Pascal Lux Alda, PA-C 11/27/12 1254

## 2012-11-27 NOTE — ED Provider Notes (Signed)
Medical screening examination/treatment/procedure(s) were performed by non-physician practitioner and as supervising physician I was immediately available for consultation/collaboration.  Loui Massenburg K Khyre Germond-Rasch, MD 11/27/12 2303 

## 2012-11-28 NOTE — Telephone Encounter (Signed)
thx

## 2012-11-28 NOTE — Telephone Encounter (Signed)
Spoke to pt, she states that she is feeling slightly better. Pt stated that she was dx with  GERD & given prilosec. Pt also stated that she was dx with carpel tunnel in right arm. Pt has an appt to see Dr. Drue Novel on 1.20.14.

## 2012-12-01 ENCOUNTER — Ambulatory Visit (INDEPENDENT_AMBULATORY_CARE_PROVIDER_SITE_OTHER): Payer: BC Managed Care – PPO | Admitting: Internal Medicine

## 2012-12-01 ENCOUNTER — Encounter: Payer: Self-pay | Admitting: Internal Medicine

## 2012-12-01 VITALS — BP 112/78 | HR 78 | Temp 98.7°F | Wt 212.0 lb

## 2012-12-01 DIAGNOSIS — L259 Unspecified contact dermatitis, unspecified cause: Secondary | ICD-10-CM

## 2012-12-01 DIAGNOSIS — K219 Gastro-esophageal reflux disease without esophagitis: Secondary | ICD-10-CM

## 2012-12-01 DIAGNOSIS — L409 Psoriasis, unspecified: Secondary | ICD-10-CM | POA: Insufficient documentation

## 2012-12-01 DIAGNOSIS — L309 Dermatitis, unspecified: Secondary | ICD-10-CM | POA: Insufficient documentation

## 2012-12-01 DIAGNOSIS — G5600 Carpal tunnel syndrome, unspecified upper limb: Secondary | ICD-10-CM

## 2012-12-01 MED ORDER — NYSTATIN-TRIAMCINOLONE 100000-0.1 UNIT/GM-% EX CREA: TOPICAL_CREAM | Freq: Four times a day (QID) | CUTANEOUS | Status: DC

## 2012-12-01 MED ORDER — OMEPRAZOLE 20 MG PO CPDR: 20.0000 mg | DELAYED_RELEASE_CAPSULE | Freq: Every day | ORAL | Status: DC

## 2012-12-01 NOTE — Assessment & Plan Note (Signed)
Symptoms consistent with GERD Plan: Continue with PPIs for 6 weeks every day, then as needed Precautions discussed.

## 2012-12-01 NOTE — Assessment & Plan Note (Signed)
Rash under the breasts seems mycotic. Will prescribe Mycolog for the chest rash but also for the neck.

## 2012-12-01 NOTE — Progress Notes (Signed)
  Subjective:    Patient ID: Desiree Dunn, female    DOB: 1971/03/08, 42 y.o.   MRN: [id number]  HPI ER followup Few days ago she developed anterior chest pain described as burning, some radiation to the throat, associated with mild palpitations and shortness of breath. Went to the ER, chart reviewed , EKG, chest x-ray, BMP, CBC, troponins were negative. She was diagnosed with GERD, start Prilosec and feels better.  At that time, she also reports pain in the right hand and some tingling in the second and third finger, was diagnosed with CTS.  Additionally, days ago developed a rash at the chest > neck. The rash is a slightly itchy particularly in the chest.  Past Medical History:   g4 p2 miscarriage x 1, ectopic pregnancy x1 (R) 1995   chronic RLQ pain since 1995 when she had a ectopic preg. (R)   had a hysterectomy 2 -2011 d/t pelvic congestion; did not help the chronic R sided pain   her gynecologist prescribed Cymbalta -- no help, d/c , better as of 03-2012  Past Surgical History:   Tubal ligation   Breast reduction 12-09   NEGATIVE COLONOSCOPY 2001, 2006, 2011 (ascending colon tics)   hysterectomy, R oophorectomy 12-2009 [facility]  Family History: stroke--F    DM -- F   MI--no HTN--F  S colon ca-- mom  Dx in her 24'S Breast ca-- GM    Social History: Married, 2 kids (15 and 20) works at [employer],  [employer], broadcasting/film/video   tobacco-- no ETOH--no diet-- improved exercise -- some better      Review of Systems Looking back, she admits to occasional heartburn from time to time in the last year, has been taking OTC meds as needed. Denies dysphasia or odynophagia. No recent airplane trips, leg swelling or calf pain.     Objective:   Physical Exam  Skin:      General -- alert, well-developed, overweight-appearing.   Lungs -- normal respiratory effort, no intercostal retractions, no accessory muscle use, and normal breath sounds.   Heart-- normal rate, regular rhythm, no  murmur, and no gallop.   Abdomen--soft, non-tender, no distention, no masses, no HSM, no guarding, and no rigidity.   Extremities-- no pretibial edema bilaterally; inspection - palpation of the hands, wrists and elbows is normal. Motor exam upper extremities normal  Psych-- Cognition and judgment appear intact. Alert and cooperative with normal attention span and concentration.  not anxious appearing and not depressed appearing.       Assessment & Plan:

## 2012-12-01 NOTE — Assessment & Plan Note (Signed)
She doesn't symptoms suggestive of CTS on the right along with probably a tendinitis. Recommend : Splinters and Motrin for now

## 2012-12-01 NOTE — Patient Instructions (Addendum)
Use a cream 3 times a day for 10-14 days Prilosec before breakfast every day for 6 weeks, then as needed

## 2012-12-11 ENCOUNTER — Encounter: Payer: Self-pay | Admitting: Internal Medicine

## 2012-12-11 ENCOUNTER — Ambulatory Visit (INDEPENDENT_AMBULATORY_CARE_PROVIDER_SITE_OTHER): Payer: BC Managed Care – PPO | Admitting: Internal Medicine

## 2012-12-11 ENCOUNTER — Telehealth: Payer: Self-pay | Admitting: Internal Medicine

## 2012-12-11 VITALS — BP 116/82 | HR 81 | Temp 98.4°F | Wt 213.0 lb

## 2012-12-11 DIAGNOSIS — L309 Dermatitis, unspecified: Secondary | ICD-10-CM

## 2012-12-11 DIAGNOSIS — L259 Unspecified contact dermatitis, unspecified cause: Secondary | ICD-10-CM

## 2012-12-11 MED ORDER — BETAMETHASONE DIPROPIONATE AUG 0.05 % EX CREA: TOPICAL_CREAM | Freq: Two times a day (BID) | CUTANEOUS | Status: DC

## 2012-12-11 NOTE — Assessment & Plan Note (Signed)
Itchy rash not responding to Mycolog, slightly worse between the breast. Eczema? Plan: Topical steroids, derm  referral if not improving

## 2012-12-11 NOTE — Patient Instructions (Addendum)
Use the new cream twice a day, call if not improving for a dermatology referral

## 2012-12-11 NOTE — Telephone Encounter (Signed)
[facility] Triage Call Report Triage Record Num: [id number] Operator: Griselda Miner Patient Name: Desiree Dunn Call Date & Time: 12/10/2012 5:29:35PM Patient Phone: [phone] PCP: Nolon Rod. Paz Patient Gender: Female PCP Fax : Patient DOB: July 08, 1971 Practice Name: [facility] Reason for Call: Caller: [name]/Patient; PCP: Willow Ora; CB#: [phone]; Call regarding Nystatin is not helping.; Rash has spread to neck, chest, between and under breast and back of neck. Appearance is reddish almost whelp like. One on back of neck is dark. Rash is itching and patient is trying not to rub it. Mycolog is not working with area having spread. Was initally seen by provider on 12/01/12 for this and as ER follow up from 11/26/12. Triaged using Rash with a disposition to see provider within 24 hours due to evaluated by provider and symptoms worsening when following recommended treatment plan. Care advice given with caller demonstrating understanding. Appointment scheduled for 12/11/12 at 09:15. Protocol(s) Used: Rash Recommended Outcome per Protocol: See Provider within 24 hours Reason for Outcome: Evaluated by provider AND symptoms worsening when following recommended treatment plan Care Advice: ~ Avoid substances associated with symptoms. Cool/tepid showers or baths may help relieve itching. If cool water alone does not relieve itching, try adding 1/2 to 1 cup baking soda or colloidal oatmeal (Aveeno) to bath water. ~ Avoid use of perfumed or strong soaps, detergents or any product that is irritating to the skin. Only use mild, unscented soap (Aveeno, Neutrogena) for bathing to decrease irritation. ~ ~ List, or take, all current prescription(s), nonprescription or alternative medication(s) to provider for evaluation.

## 2012-12-11 NOTE — Telephone Encounter (Signed)
Pt has appt today

## 2012-12-11 NOTE — Progress Notes (Signed)
  Subjective:    Patient ID: Desiree Dunn, female    DOB: Apr 05, 1971, 42 y.o.   MRN: [id number]  HPI Acute visit Was seen with a rash days ago, was prescribed Mycolog, she has been using 3-4 times a day, rash is worse. + Itching.  Past Medical History  Diagnosis Date  . Miscarriage     x 1  . Ectopic pregnancy 1995     x 1 , R side   . Chronic RLQ pain     since 1995 when she had ectopic preg  . Pelvic congestion     h/o   . Diverticulitis    Past Surgical History  Procedure Date  . Tubal ligation   . Breast reduction surgery 12/09  . Abdominal hysterectomy 12/2009    [facility]  . Right oophorectomy 12/2009    [facility]   History   Social History  . Marital Status: Married    Spouse Name: N/A    Number of Children: 2  . 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 Smoking status: Never Smoker   . Smokeless tobacco: Never Used  . Alcohol Use: Yes     Comment: socially   . Drug Use: No  . Sexually Active: Not on file   Other Topics Concern  . Not on file   Social History Narrative  . No narrative on file    Review of Systems     Objective:   Physical Exam  Constitutional: She appears well-developed and well-nourished.  Skin:     Psychiatric: She has a normal mood and affect. Her behavior is normal. Judgment and thought content normal.       Assessment & Plan:

## 2013-03-02 ENCOUNTER — Ambulatory Visit (INDEPENDENT_AMBULATORY_CARE_PROVIDER_SITE_OTHER): Payer: BC Managed Care – PPO | Admitting: Internal Medicine

## 2013-03-02 ENCOUNTER — Encounter: Payer: Self-pay | Admitting: Internal Medicine

## 2013-03-02 VITALS — BP 130/82 | HR 74 | Temp 98.5°F | Wt 219.0 lb

## 2013-03-02 DIAGNOSIS — R1031 Right lower quadrant pain: Secondary | ICD-10-CM

## 2013-03-02 DIAGNOSIS — R197 Diarrhea, unspecified: Secondary | ICD-10-CM

## 2013-03-02 MED ORDER — CIPROFLOXACIN HCL 500 MG PO TABS: 500.0000 mg | ORAL_TABLET | Freq: Two times a day (BID) | ORAL | Status: DC

## 2013-03-02 NOTE — Progress Notes (Signed)
  Subjective:    Patient ID: Desiree Dunn, female    DOB: 06/01/1971, 42 y.o.   MRN: [id number]  HPI She began to have dull right lower quadrant pain up to a level VII on 02/26/13. This is been essentially  non-radiating and intermittent.  She has had some watery bowel movements. She has had no antibiotics in the last 6-12 weeks. 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 She's had no significant travel or exposure to sick animals. She had taken a laxative to relieve the pressure discomfort. The pain has remained essentially the same since its onset.  She has a past history of diverticulosis. One brother has had colon polyps removed and a second brother has had Crohn's disease.  She has not had an appendectomy    Review of Systems She denies fever, chills, or sweats.  She denies dysuria, pyuria, or hematuria.     Objective:   Physical Exam General appearance is one of good health and nourishment w/o distress.  Eyes: No conjunctival inflammation or scleral icterus is present.  Oral exam: Dental hygiene is good; lips and gums are healthy appearing.There is no oropharyngeal erythema or exudate noted. Osteoma of hard palate  Heart:  Normal rate and regular rhythm. S1 and S2 normal without gallop, murmur, click, rub or other extra sounds     Lungs:Chest clear to auscultation; no wheezes, rhonchi,rales ,or rubs present.No increased work of breathing.   Abdomen: bowel sounds normal, soft  But slightly tender in both LQs, RLQ > LLQ. No masses, organomegaly or hernias noted.  No guarding or rebound . Slight discomfort to percussion of the right flank.  Musculoskeletal: Negative straight leg raising bilaterally  Skin:Warm & dry.  Intact without suspicious lesions or rashes ; no jaundice or tenting  Lymphatic: No lymphadenopathy is noted about the head, neck, axilla            Assessment & Plan:  #1 RIGHT lower quadrant discomfort greater than left  lower quadrant discomfort; rule out atypical presentation of diverticulitis  #2 diarrhea  #3slight flank tenderness to percussion  Plan: See orders and recommendations

## 2013-03-02 NOTE — Patient Instructions (Addendum)
Stay on clear liquids for 48-72 hours or until bowels are normal.This would include  jello, sherbert (NOT ice cream), Lipton's chicken noodle soup(NOT cream based soups),Gatorade Lite, flat Ginger ale (without High Fructose Corn Syrup),dry toast or crackers, baked potato.No milk , dairy or grease until bowels are formed. Align , a Pro Biotic , daily if stools are loose. Immodium AD for frankly watery stool. Report increasing pain, fever or rectal bleeding 

## 2013-03-03 LAB — CBC WITH DIFFERENTIAL/PLATELET
Basophils Absolute: 0 10*3/uL (ref 0.0–0.1)
Basophils Relative: 0.6 % (ref 0.0–3.0)
Eosinophils Relative: 2 % (ref 0.0–5.0)
HCT: 39.1 % (ref 36.0–46.0)
Hemoglobin: 13.2 g/dL (ref 12.0–15.0)
Lymphs Abs: 1.9 10*3/uL (ref 0.7–4.0)
Monocytes Relative: 6.7 % (ref 3.0–12.0)
Neutro Abs: 2.7 10*3/uL (ref 1.4–7.7)
RDW: 13.4 % (ref 11.5–14.6)

## 2013-03-03 LAB — POCT URINALYSIS DIPSTICK
Bilirubin, UA: NEGATIVE
Blood, UA: NEGATIVE
Ketones, UA: NEGATIVE
Spec Grav, UA: >=1.03
pH, UA: 6

## 2013-03-05 ENCOUNTER — Encounter: Payer: Self-pay | Admitting: Internal Medicine

## 2013-03-06 ENCOUNTER — Telehealth: Payer: Self-pay | Admitting: Internal Medicine

## 2013-03-06 NOTE — Telephone Encounter (Addendum)
The patient was recently seen with abdominal pain,Urinalysis was negative, WBCs were normal. She was prescribed Cipro which has not started. I called today to check on her: She continue with mild constipation, Lower abdominal pain has decrease , appetite is improving. Denies fever or chills. I'm still somehow concerned about possible appendicitis although her symptoms started 02/26/2013. She also could have mild diverticulitis which may be self resolving. We talk about possibly doing a CT, she prefers to wait. We agreed to continue observation, no antibiotics at this time. If symptoms persist during the weekend, she will go  to the ER and get further evaluation.

## 2013-03-11 ENCOUNTER — Telehealth: Payer: Self-pay | Admitting: *Deleted

## 2013-03-11 NOTE — Telephone Encounter (Signed)
Called pt, unable to leave a msg.   

## 2013-03-11 NOTE — Telephone Encounter (Signed)
Message copied by Nada Maclachlan on Wed Mar 11, 2013  5:08 PM ------      Message from: Willow Ora E      Created: Fri Mar 06, 2013  3:47 PM      Regarding: Please check on her       Had abdominal pain last week, is she back to normal? ------

## 2013-03-23 ENCOUNTER — Encounter: Payer: BC Managed Care – PPO | Admitting: Internal Medicine

## 2013-04-16 ENCOUNTER — Encounter: Payer: BC Managed Care – PPO | Admitting: Internal Medicine

## 2013-04-23 ENCOUNTER — Ambulatory Visit (INDEPENDENT_AMBULATORY_CARE_PROVIDER_SITE_OTHER): Payer: BC Managed Care – PPO | Admitting: Internal Medicine

## 2013-04-23 ENCOUNTER — Encounter: Payer: Self-pay | Admitting: Internal Medicine

## 2013-04-23 VITALS — BP 120/78 | HR 74 | Temp 98.1°F | Ht 65.0 in | Wt 215.0 lb

## 2013-04-23 DIAGNOSIS — G8929 Other chronic pain: Secondary | ICD-10-CM

## 2013-04-23 DIAGNOSIS — K4090 Unilateral inguinal hernia, without obstruction or gangrene, not specified as recurrent: Secondary | ICD-10-CM

## 2013-04-23 DIAGNOSIS — Z0000 Encounter for general adult medical examination without abnormal findings: Secondary | ICD-10-CM

## 2013-04-23 DIAGNOSIS — R1031 Right lower quadrant pain: Secondary | ICD-10-CM

## 2013-04-23 DIAGNOSIS — Z23 Encounter for immunization: Secondary | ICD-10-CM

## 2013-04-23 NOTE — Progress Notes (Signed)
Subjective:    Patient ID: Desiree Dunn, female    DOB: [birth year], 42 y.o.   MRN: [id number]  HPI CPX  Past Medical History  Diagnosis Date  . Miscarriage     x 1  . Ectopic pregnancy 1995     x 1 , R side   . Chronic RLQ pain     since 1995 when she had ectopic preg  . Pelvic congestion     h/o   . Diverticulitis    Past Surgical History  Procedure Laterality Date  . Tubal ligation    . Breast reduction surgery  12/09  . Abdominal hysterectomy  12/2009    [facility]  . Right ooph[place]omy  12/2009    [place]  . Colonoscopy  2001    X3 ; diverticulosis   History   Social History  . Marital Status: Married    Spouse Name: N/A    Number of Children: 2  . Years of Education: N/A   Occupational History  . [employer],teacher, PT   . works FT at [employer]   Social History Main Topics  . Smoking status: Never Smoker   . Smokeless tobacco: Never Used  . Alcohol Use: Yes     Comment: socially   . Drug Use: No  . Sexually Active: Not on file   Other Topics Concern  . Not on file   Social History Narrative  . No narrative on file   Family History  Problem Relation Age of Onset  . Stroke Father   . Diabetes Father   . Heart attack Neg Hx   . Hypertension Father   . Hypertension Sister   . Colon cancer Mother   . Breast cancer      GM  . Colon polyps Brother   . Colitis Brother     Review of Systems 6 weeks ago started to be more active and do more exercises. Since then her chronic right lower quadrant pain has increased in intensity and frequency. Worse with certain movements. No mass at the inguinal hernia. See assessment and plan. 2 weeks ago, also noted B low back pain and pain in the right Paraspinal muscle by the T spine . No fever or chills No dysuria gross hematuria No chest pain or SOB No recent nausea, vomiting, diarrhea. Very rarely sees few drops of blood per rectum.      Objective:   Physical Exam  Abdominal:      BP 120/78  Pulse 74  Temp(Src) 98.1 F (36.7 C) (Oral)  Ht 5\' 5"  (1.651 m)  Wt 215 lb (97.523 kg)  BMI 35.78 kg/m2  SpO2 98%  General -- alert, well-developed, NAD.   Neck --no thyromegaly  Lungs -- normal respiratory effort, no intercostal retractions, no accessory muscle use, and normal breath sounds.   Heart-- normal rate, regular rhythm, no murmur, and no gallop.   Abdomen-- Not distended, soft, mild discomfort throughout the right lower quadrant of the abdomen without mass or rebound. Question of inguinal hernia when she coughs on the right. See graphic   Extremities-- no pretibial edema bilaterally. Normal hip rotation, when she rotates the hip she somehow reproduce the right lower quadrant abdominal pain. Neurologic-- alert & oriented X3 and strength normal in all extremities. Psych-- Cognition and judgment appear intact. Alert and cooperative with normal attention span and concentration.  not anxious appearing and not depressed appearing.      Assessment & Plan:  In addition to  a CPX, we spent at least 15 minutes discussing the chronic right lower quadrant pain

## 2013-04-23 NOTE — Patient Instructions (Addendum)
For pain you can take  either Motrin or Tylenol sporadically before exercise, see below . Motrin 200 mg 2 tablets every 6 hours as needed for pain. Always take it with food. Watch for stomach side effects (gastritis): nausea, stomach pain, change in the color of stools. Tylenol  500 mg OTC 2 tabs a day every 8 hours as needed for pain ---- Next visit in 6 months

## 2013-04-23 NOTE — Assessment & Plan Note (Addendum)
Reports on and off RLQ pain since 1995 when she had surgery for ectopic surgery, exacerbated lately after she started to do exercises. In the past was evaluated by her gynecologist, a specialist for pelvic pain and also a "myofascial specialist". On exam, there is a question of right hernia (tha wouldn't explain the more diffuse right lower quadrant abdominal pain). Plan:  CT abdomen and pelvis for eval of chronic RLQ pain. Surgical referral for eval of ? Of hernia

## 2013-04-23 NOTE — Assessment & Plan Note (Addendum)
Td 2004 and today female care per gyn  FH colon ca and polyps: Cscope 2001, 2006 and again 11-2009 d/t  rectal bleed, neg except for tics----- next 5 years  Diet  Discussed Encourage to stay active even if she has some back pain. Recommend stretching, judiciouss use of  Tylenol or Motrin. May consider PT or a chiropractor referral for back pain.  labs

## 2013-04-24 ENCOUNTER — Encounter: Payer: Self-pay | Admitting: Internal Medicine

## 2013-04-24 LAB — LIPID PANEL
HDL: 62.1 mg/dL (ref >39.00)
LDL Cholesterol: 89 mg/dL (ref 0–99)
Total CHOL/HDL Ratio: 3
VLDL: 31.6 mg/dL (ref 0.0–40.0)

## 2013-04-24 LAB — HEPATIC FUNCTION PANEL: Albumin: 4.1 g/dL (ref 3.5–5.2)

## 2013-04-24 LAB — TSH: TSH: 0.66 u[IU]/mL (ref 0.35–5.50)

## 2013-04-27 ENCOUNTER — Encounter: Payer: Self-pay | Admitting: Internal Medicine

## 2013-04-29 ENCOUNTER — Ambulatory Visit (HOSPITAL_BASED_OUTPATIENT_CLINIC_OR_DEPARTMENT_OTHER)
Admission: RE | Admit: 2013-04-29 | Discharge: 2013-04-29 | Disposition: A | Discharge status: Home / Self Care | Payer: BC Managed Care – PPO | Source: Ambulatory Visit | Attending: Internal Medicine | Admitting: Internal Medicine

## 2013-04-29 ENCOUNTER — Encounter (HOSPITAL_BASED_OUTPATIENT_CLINIC_OR_DEPARTMENT_OTHER): Payer: Self-pay

## 2013-04-29 DIAGNOSIS — Z9889 Other specified postprocedural states: Secondary | ICD-10-CM | POA: Insufficient documentation

## 2013-04-29 DIAGNOSIS — G8929 Other chronic pain: Secondary | ICD-10-CM

## 2013-04-29 DIAGNOSIS — R109 Unspecified abdominal pain: Secondary | ICD-10-CM | POA: Insufficient documentation

## 2013-04-29 MED ORDER — IOHEXOL 300 MG/ML  SOLN
100.0000 mL | Freq: Once | INTRAMUSCULAR | Status: AC | PRN
  Administered 2013-04-29: 100 mL via INTRAVENOUS

## 2013-04-30 ENCOUNTER — Encounter: Payer: Self-pay | Admitting: *Deleted

## 2013-05-04 ENCOUNTER — Encounter (INDEPENDENT_AMBULATORY_CARE_PROVIDER_SITE_OTHER): Payer: Self-pay | Admitting: General Surgery

## 2013-05-04 ENCOUNTER — Ambulatory Visit (INDEPENDENT_AMBULATORY_CARE_PROVIDER_SITE_OTHER): Payer: BC Managed Care – PPO | Admitting: General Surgery

## 2013-05-04 VITALS — BP 126/72 | HR 78 | Temp 97.6°F | Resp 14 | Ht 64.5 in | Wt 216.2 lb

## 2013-05-04 DIAGNOSIS — R1031 Right lower quadrant pain: Secondary | ICD-10-CM

## 2013-05-04 DIAGNOSIS — G8929 Other chronic pain: Secondary | ICD-10-CM

## 2013-05-04 NOTE — Patient Instructions (Signed)
Use miralax to prevent constipation May use probiotics

## 2013-05-04 NOTE — Progress Notes (Signed)
Subjective:     Patient ID: Desiree Dunn, female   DOB: Sep 07, 1971, 42 y.o.   MRN: [id number]  HPI We're asked to see the patient in consultation by Dr. Drue Novel to evaluate her for right lower quadrant pain. The patient is a 42 year old black female who has been having some right lower quadrant abdominal pain off and on since the mid 1990s. She has irregular bowel movements and most of the time has constipation. She denies any nausea or vomiting. She did undergo a recent CT scan which showed no evidence of hernia  Review of Systems  Constitutional: Negative.   HENT: Negative.   Eyes: Negative.   Respiratory: Negative.   Cardiovascular: Negative.   Gastrointestinal: Positive for abdominal pain and constipation.  Endocrine: Negative.   Genitourinary: Negative.   Musculoskeletal: Negative.   Skin: Negative.   Allergic/Immunologic: Negative.   Neurological: Negative.   Hematological: Negative.   Psychiatric/Behavioral: Negative.        Objective:   Physical Exam  Constitutional: She is oriented to person, place, and time. She appears well-developed and well-nourished.  HENT:  Head: Normocephalic and atraumatic.  Eyes: Conjunctivae and EOM are normal. Pupils are equal, round, and reactive to light.  Neck: Normal range of motion. Neck supple.  Cardiovascular: Normal rate, regular rhythm and normal heart sounds.   Pulmonary/Chest: Effort normal and breath sounds normal.  Abdominal: Soft. Bowel sounds are normal.  There is only some mild tenderness in the right lower quadrant. Her abdomen is otherwise soft. There is no palpable evidence for hernia  Musculoskeletal: Normal range of motion.  Neurological: She is alert and oriented to person, place, and time.  Skin: Skin is warm and dry.  Psychiatric: She has a normal mood and affect. Her behavior is normal.       Assessment:     The patient appears to have some chronic right lower quadrant pain. Her CT scan and physical exam  did not show any evidence of hernia to account for her pain.     Plan:     Unfortunately I do not have a good explanation for her right lower quadrant pain. If her pain became unbearable I think we could potentially do a diagnostic laparoscopy but I think the chances of [place] find anything that would fix her pain are low. I have discussed this with her. She would like to try MiraLAX to treat her constipation as well as probiotics and see if this makes her abdominal pain any better. We will plan to see her back on a when necessary basis.

## 2013-08-03 ENCOUNTER — Encounter: Payer: Self-pay | Admitting: Internal Medicine

## 2013-08-03 ENCOUNTER — Ambulatory Visit (INDEPENDENT_AMBULATORY_CARE_PROVIDER_SITE_OTHER): Payer: BC Managed Care – PPO | Admitting: Internal Medicine

## 2013-08-03 VITALS — BP 138/84 | HR 84 | Temp 99.1°F | Wt 219.8 lb

## 2013-08-03 DIAGNOSIS — R51 Headache: Secondary | ICD-10-CM

## 2013-08-03 MED ORDER — VERAPAMIL HCL ER 120 MG PO TBCR: 120.0000 mg | EXTENDED_RELEASE_TABLET | Freq: Every day | ORAL | Status: DC

## 2013-08-03 MED ORDER — MELOXICAM 15 MG PO TABS: 15.0000 mg | ORAL_TABLET | Freq: Every day | ORAL | Status: DC | PRN

## 2013-08-03 NOTE — Patient Instructions (Addendum)
Will schedule a CT of the sinuses Take verapamil one every night for prevention of headaches. Meloxicam one a day as needed for headaches. Always take it with food because may cause gastritis and ulcers. If you notice nausea, stomach pain, change in the color of stools --->  Stop the medicine and let us know Please call if not improving in the next few days

## 2013-08-03 NOTE — Assessment & Plan Note (Addendum)
Persisting headaches for 2 months, worse for about 2 weeks. On chart review, she had similar symptoms 2 years ago, CT was (-), has been nearly asymptomatic until 2 months ago. DDX is large but includes migraines, cluster headaches, versus others. Plan: Start verapamil prophylaxis (cluster headaches?) CT sinuses Meloxicam for acute treatment Refer  to neurology

## 2013-08-03 NOTE — Progress Notes (Signed)
  Subjective:    Patient ID: Desiree Dunn, female    DOB: 17-Feb-1971, 42 y.o.   MRN: [id number]  HPI Acute visit 2 months history of  headaches, more persistent for the last2 weeks. The headache is described as a pain behind the eyes, worse on the right, associated with occasional dizziness which is not severe described as unsteadiness. He is present sometimes for hours. Has not been taking any particular medication for the headache  Past Medical History  Diagnosis Date  . Miscarriage     x 1  . Ectopic pregnancy 1995     x 1 , R side   . Chronic RLQ pain     since 1995 when she had ectopic preg  . Pelvic congestion     h/o   . Diverticulitis   . GERD (gastroesophageal reflux disease)    Past Surgical History  Procedure Laterality Date  . Tubal ligation    . Breast reduction surgery  12/09  . Abdominal hysterectomy  12/2009    [facility]  . Right oophorectomy  12/2009    [place]  . Colonoscopy  2001    X3 ; diverticulosis     Review of Systems Denies any fever or chills. No diplopia or any visual disturbances. Not recent head injury. Admits to some tearing, R>L Denies any sore throat, runny nose, nasal discharge or ear discharge.     Objective:   Physical Exam BP 138/84  Pulse 84  Temp(Src) 99.1 F (37.3 C)  Wt 219 lb 12.8 oz (99.701 kg)  BMI 37.16 kg/m2  SpO2 99%  General -- alert, well-developed, NAD.  Neck --FROM HEENT-- Not pale. TMs normal, throat symmetric, no redness or discharge. Face symmetric, sinuses not tender to palpation. Nose not congested. Lungs -- normal respiratory effort, no intercostal retractions, no accessory muscle use, and normal breath sounds.  Heart-- normal rate, regular rhythm, no murmur.   Extremities-- no pretibial edema bilaterally  Neurologic--  alert & oriented X3. Speech normal, gait normal, strength normal in all extremities. DTRs symmetric  EOMI, PERLA   Psych-- Cognition and judgment appear intact. Cooperative  with normal attention span and concentration. No anxious appearing , no depressed appearing.      Assessment & Plan:

## 2013-08-05 ENCOUNTER — Ambulatory Visit (HOSPITAL_BASED_OUTPATIENT_CLINIC_OR_DEPARTMENT_OTHER)
Admission: RE | Admit: 2013-08-05 | Discharge: 2013-08-05 | Disposition: A | Discharge status: Home / Self Care | Payer: BC Managed Care – PPO | Source: Ambulatory Visit | Attending: Internal Medicine | Admitting: Internal Medicine

## 2013-08-05 DIAGNOSIS — J342 Deviated nasal septum: Secondary | ICD-10-CM | POA: Insufficient documentation

## 2013-08-05 DIAGNOSIS — R51 Headache: Secondary | ICD-10-CM | POA: Insufficient documentation

## 2013-08-06 ENCOUNTER — Other Ambulatory Visit: Payer: Self-pay | Admitting: *Deleted

## 2013-08-06 ENCOUNTER — Telehealth: Payer: Self-pay | Admitting: *Deleted

## 2013-08-06 DIAGNOSIS — R51 Headache: Secondary | ICD-10-CM

## 2013-08-06 NOTE — Telephone Encounter (Signed)
Called and left message for patient to inform her that a referral has been put in place for neurology and that her CT sinus was negative. JG

## 2013-08-06 NOTE — Telephone Encounter (Addendum)
Desiree Dunn, I enter a order for a neurology referral, I know of our neurologists have the capability of see our pts promptly, could she be seen within 3 or 4 days.? If not, let me know, I could order a brain MRI in the meantime Also, her CT sinus is negative

## 2013-08-06 NOTE — Telephone Encounter (Signed)
Spoke with patient and she stated that she was seen 08/03/13 for her headaches and she is still having the headache. She states that she is taking the verapamil (CALAN-SR) 120 MG CR tablet and meloxicam (MOBIC) 15 MG tablet at night before bed. She states that she goes to bed with a headache and wakes up with a headache and that she has little relief during the day. She would like to know if she should take something else along with these medications or if something new should be prescribed. Please advise. Patient can be reached via phone at work today at [phone] ext [phone].

## 2013-08-25 ENCOUNTER — Encounter: Payer: Self-pay | Admitting: Internal Medicine

## 2013-08-25 ENCOUNTER — Other Ambulatory Visit: Payer: Self-pay

## 2013-08-25 DIAGNOSIS — Z9889 Other specified postprocedural states: Secondary | ICD-10-CM

## 2013-08-25 DIAGNOSIS — Z1231 Encounter for screening mammogram for malignant neoplasm of breast: Secondary | ICD-10-CM

## 2013-09-03 ENCOUNTER — Ambulatory Visit (INDEPENDENT_AMBULATORY_CARE_PROVIDER_SITE_OTHER): Payer: BC Managed Care – PPO | Admitting: General Practice

## 2013-09-03 ENCOUNTER — Ambulatory Visit: Payer: BC Managed Care – PPO

## 2013-09-03 DIAGNOSIS — Z23 Encounter for immunization: Secondary | ICD-10-CM

## 2013-09-07 ENCOUNTER — Encounter: Payer: Self-pay | Admitting: Internal Medicine

## 2013-09-08 ENCOUNTER — Other Ambulatory Visit: Payer: Self-pay | Admitting: Internal Medicine

## 2013-09-08 DIAGNOSIS — R51 Headache: Secondary | ICD-10-CM

## 2013-09-15 ENCOUNTER — Ambulatory Visit: Payer: BC Managed Care – PPO

## 2013-09-16 ENCOUNTER — Telehealth: Payer: Self-pay | Admitting: *Deleted

## 2013-09-16 NOTE — Telephone Encounter (Signed)
Received phone call from patient stating that she has a burn mark on her right breast. She stated that she did not burn her breast on anything, but she is very concerned about what it may be. She states she feels no pain. Appt was made for tomorrow at 11:30. Ottis Stain, CMA

## 2013-09-17 ENCOUNTER — Encounter: Payer: Self-pay | Admitting: Internal Medicine

## 2013-09-17 ENCOUNTER — Ambulatory Visit (INDEPENDENT_AMBULATORY_CARE_PROVIDER_SITE_OTHER): Payer: BC Managed Care – PPO | Admitting: Internal Medicine

## 2013-09-17 VITALS — BP 127/85 | HR 80 | Temp 98.3°F

## 2013-09-17 DIAGNOSIS — L989 Disorder of the skin and subcutaneous tissue, unspecified: Secondary | ICD-10-CM

## 2013-09-17 DIAGNOSIS — R51 Headache: Secondary | ICD-10-CM

## 2013-09-17 MED ORDER — DOXYCYCLINE HYCLATE 100 MG PO TABS: 100.0000 mg | ORAL_TABLET | Freq: Two times a day (BID) | ORAL | Status: DC

## 2013-09-17 MED ORDER — HYDROCORTISONE 2.5 % EX CREA: TOPICAL_CREAM | Freq: Two times a day (BID) | CUTANEOUS | Status: DC

## 2013-09-17 NOTE — Progress Notes (Signed)
  Subjective:    Patient ID: Desiree Dunn, female    DOB: 1971-10-28, 42 y.o.   MRN: [id number]  HPI   Acute visit Noted skin lesion in the right breast 3 days ago, since then the size is about the same but today looks slightly more red. "Looks like a burn" Denies itching, mild pain. Has not seen blisters or discharge.  Past Medical History  Diagnosis Date  . Miscarriage     x 1  . Ectopic pregnancy 1995     x 1 , R side   . Chronic RLQ pain     since 1995 when she had ectopic preg  . Pelvic congestion     h/o   . Diverticulitis   . GERD (gastroesophageal reflux disease)    Past Surgical History  Procedure Laterality Date  . Tubal ligation    . Breast reduction surgery  12/09  . Abdominal hysterectomy  12/2009    [facility]  . Right o[place]omy  12/2009    [place]  . Colonoscopy  2001    X3 ; diverticulosis     Review of Systems Denies fever or chills Headaches, still there, slightly better, has an appointment pending to see neurology. No niple d/c     Objective:   Physical Exam  Constitutional: She appears well-developed and well-nourished.  Psychiatric: She has a normal mood and affect. Her behavior is normal. Judgment and thought content normal.  Right breast: at the lower quadrant there is a 1 cm area with a very superficial ulcer-like lesion, borders are well defined, no blisters, no discharge . Very mild redness around it. Palpation of the right breast is without dominant mass. Nipple wnl     Assessment & Plan:  Very superficial ulcer at the right breast, mild redness around it. Staph infection? I don't believe she has shingles or any breast pathology per se Plan: Antibiotics, topical creams, see instructions

## 2013-09-17 NOTE — Patient Instructions (Signed)
Take the antibiotic as prescribed and use the cream twice a day for one week If not back to normal within 2 weeks let me know, call anytime if they area looks worse.

## 2013-09-17 NOTE — Assessment & Plan Note (Signed)
CT sinuses were negative, headaches are slightly better, good compliance with CCBs to see neuro soon

## 2013-09-23 ENCOUNTER — Encounter: Payer: Self-pay | Admitting: Podiatry

## 2013-09-28 ENCOUNTER — Telehealth: Payer: Self-pay | Admitting: *Deleted

## 2013-09-28 NOTE — Telephone Encounter (Signed)
Pt cancelled 09/16/2013 surgery with Dr Charlsie Merles, states unable to take the time off from work.  Dr Charlsie Merles was informed.

## 2013-10-02 ENCOUNTER — Ambulatory Visit: Payer: BC Managed Care – PPO | Admitting: Neurology

## 2013-10-02 ENCOUNTER — Encounter: Payer: Self-pay | Admitting: Neurology

## 2013-10-02 ENCOUNTER — Ambulatory Visit (INDEPENDENT_AMBULATORY_CARE_PROVIDER_SITE_OTHER): Payer: BC Managed Care – PPO | Admitting: Neurology

## 2013-10-02 VITALS — BP 128/74 | HR 78 | Temp 98.6°F | Ht 64.5 in | Wt 220.0 lb

## 2013-10-02 DIAGNOSIS — G43009 Migraine without aura, not intractable, without status migrainosus: Secondary | ICD-10-CM

## 2013-10-02 MED ORDER — VERAPAMIL HCL 80 MG PO TABS: 80.0000 mg | ORAL_TABLET | Freq: Three times a day (TID) | ORAL | Status: DC

## 2013-10-02 NOTE — Progress Notes (Signed)
NEUROLOGY CONSULTATION NOTE  Desiree Dunn MRN: [id number] DOB: 12-04-1970  Referring provider: Dr. Drue Novel Primary care provider: Dr. Drue Novel  Reason for consult:  Headache.  HISTORY OF PRESENT ILLNESS: Desiree Dunn is a 42 year old right-handed woman with history of GERD who presents for headache.  Records and images were personally reviewed where available.    Onset:  Since teenager, but worse 4 months ago (Summer).   Location:  bilateral retro-orbital and bi-temporal Quality:  Pounding, pressure Intensity:  Initially 7/10, now 5/10 since verapamil) Aura:  no Associated symptoms:  Photophobia, phonophobia, sometimes nausea Duration:  Several hours Frequency:  Severe attacks initially 2-3x/month, now 2x/month since verapamil.  Has less intense headaches about 10 days out of month Triggers/exacerbating factors:  Stress, dairy Relieving factors:  Rest, fresh air, cold compress  Past abortive therapy:  imitrex (ineffective), ibuprofen (worked but told to stop) Past preventative therapy:  none  Current abortive therapy:  Tylenol (effective but sometimes needs to take up to 1000mg ).  Takes 10 days out of the month. Current preventative therapy:  Verapamil 120mg  qhs (has noted some improvement in frequency and intensity.  Has been on it for about 6 weeks).  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  PAST MEDICAL HISTORY: Past Medical History  Diagnosis Date  . Miscarriage     x 1  . Ectopic pregnancy 1995     x 1 , R side   . Chronic RLQ pain     since 1995 when she had ectopic preg  . Pelvic congestion     h/o   . Diverticulitis   . GERD (gastroesophageal reflux disease)     PAST SURGICAL HISTORY: Past Surgical History  Procedure Laterality Date  . Tubal  ligation    . Breast reduction surgery  12/09  . Abdominal hysterectomy  12/2009    [facility]  . Right oophorectomy  12/2009    [facility]  . Colonoscopy  2001    X3 ; diverticulosis    MEDICATIONS: Current Outpatient Prescriptions on File Prior to Visit  Medication Sig Dispense Refill  . acetaminophen (TYLENOL) 500 MG tablet Take 500 mg by mouth every 6 (six) hours as needed. For pain      . augmented betamethasone dipropionate (DIPROLENE-AF) 0.05 % cream Apply topically 2 (two) times daily as needed.      . Azelastine HCl (ASTEPRO) 0.15 % SOLN Place 2 sprays into the nose 2 (two) times daily as needed. For congestion      . doxycycline (VIBRA-TABS) 100 MG tablet Take 1 tablet (100 mg total) by mouth 2 (two) times daily.  14 tablet  0  . fexofenadine (ALLEGRA) 180 MG tablet Take 180 mg by mouth as needed.      . hydrocortisone 2.5 % cream Apply topically 2 (two) times daily.  30 g  0  . Multiple Vitamin (MULTIVITAMIN WITH MINERALS) TABS Take 1 tablet by mouth daily.      [name] omeprazole (PRILOSEC) 20 MG capsule Take 1 capsule (20 mg total) by mouth daily.  30 capsule  6   No current facility-administered medications on file prior to visit.    ALLERGIES: Allergies  Allergen Reactions  . Amoxicillin     Hives    FAMILY HISTORY: Family History  Problem Relation Age  of Onset  . Stroke Father   . Diabetes Father   . Heart attack Neg Hx   . Hypertension Father   . Hypertension Sister   . Colon cancer Mother   . Breast cancer      GM  . Colon polyps Brother   . Colitis Brother     SOCIAL HISTORY: History   Social History  . Marital Status: Married    Spouse Name: N/A    Number of Children: 2  . Years of Education: N/A   Occupational History  . [employer],teacher, PT   . works FT at [employer]   Social History Main Topics  . Smoking status: Never Smoker   . Smokeless tobacco: Never Used  . Alcohol Use: Yes     Comment: socially   . Drug Use: No  .  Sexual Activity: Not on file   Other Topics Concern  . Not on file   Social History Narrative  . No narrative on file    REVIEW OF SYSTEMS: Constitutional: No fevers, chills, or sweats, no generalized fatigue, change in appetite Eyes: No visual changes, double vision, eye pain Ear, nose and throat: No hearing loss, ear pain, nasal congestion, sore throat Cardiovascular: No chest pain, palpitations Respiratory:  No shortness of breath at rest or with exertion, wheezes GastrointestinaI: No nausea, vomiting, diarrhea, abdominal pain, fecal incontinence Genitourinary:  No dysuria, urinary retention or frequency Musculoskeletal:  No neck pain, back pain Integumentary: No rash, pruritus, skin lesions Neurological: as above Psychiatric: No depression, insomnia, anxiety Endocrine: No palpitations, fatigue, diaphoresis, mood swings, change in appetite, change in weight, increased thirst Hematologic/Lymphatic:  No anemia, purpura, petechiae. Allergic/Immunologic: no itchy/runny eyes, nasal congestion, recent allergic reactions, rashes  PHYSICAL EXAM: Filed Vitals:   10/02/13 0916  BP: 128/74  Pulse: 78  Temp: 98.6 F (37 C)   General: No acute distress Head:  Normocephalic/atraumatic Neck: supple, no paraspinal tenderness, full range of motion Back: No paraspinal tenderness Heart: regular rate and rhythm Lungs: Clear to auscultation bilaterally. Vascular: No carotid bruits. Neurological Exam: Mental status: alert and oriented to person, place, and time, speech fluent and not dysarthric, language intact. Cranial nerves: CN I: not tested CN II: pupils equal, round and reactive to light, visual fields intact, fundi unremarkable. CN III, IV, VI:  full range of motion, no nystagmus, no ptosis CN V: facial sensation intact CN VII: upper and lower face symmetric CN VIII: hearing intact CN IX, X: gag intact, uvula midline CN XI: sternocleidomastoid and trapezius muscles intact CN  XII: tongue midline Bulk & Tone: normal, no fasciculations. Motor: 5/5 throughout Sensation: temperature and vibration intact Deep Tendon Reflexes: 2+ throughout, toes down Finger to nose testing: normal Heel to shin: normal Gait: normal stance and stride.  Able to walk on heels, toes and in tandem. Romberg negative.  IMPRESSION: Migraine without aura.  PLAN: 1.  Will increase verapamil to 80mg  TID 2.  Use tylenol, Excedrin or ibuprofen but only once a week 3.  Stress management. 4.  Follow up in 3 months.  Call in one month with update.  Thank you for allowing me to take part in the care of this patient.  Shon Millet, DO  CC:  Willow Ora, MD

## 2013-10-02 NOTE — Patient Instructions (Signed)
Migraine Recommendations: 1.  Take abortive medication at immediate onset of migraine.  You may use tylenol, Excedrin or NSAID (like ibuprofen or naproxen) 2.  Limit use of pain relievers to no more than 1 days out of the week.  These medications include acetaminophen, ibuprofen, triptans and narcotics.  This will help reduce risk of rebound headaches. 3.  Keep a headache diary. 4.  Stay adequately hydrated. 5.  Maintain good sleep hygiene. 6.  Maintain proper stress management. 7.  We will increase the verapamil, but I will give you 80mg  tablets to take 3 times a day. 8.  Call in 4 weeks with update.  Follow up in 3 months.

## 2013-10-06 ENCOUNTER — Telehealth: Payer: Self-pay | Admitting: Internal Medicine

## 2013-10-06 MED ORDER — TERCONAZOLE 0.4 % VA CREA: 1.0000 | TOPICAL_CREAM | Freq: Every day | VAGINAL | Status: DC

## 2013-10-06 MED ORDER — FLUCONAZOLE 150 MG PO TABS: ORAL_TABLET | ORAL | Status: DC

## 2013-10-06 NOTE — Telephone Encounter (Signed)
Sent. DJR  

## 2013-10-06 NOTE — Telephone Encounter (Signed)
Diflucan 150 mg, one tablet today, one tablet tomorrow, #2 no refills. Needs office visit if symptoms severe or if she's not getting better

## 2013-10-06 NOTE — Telephone Encounter (Signed)
Patient states the diflucan pill doesn't work for her. She is requesting terconazole cream instead.

## 2013-10-06 NOTE — Addendum Note (Signed)
Addended by: Willow Ora E on: 10/06/2013 04:49 PM   Modules accepted: Orders

## 2013-10-06 NOTE — Telephone Encounter (Signed)
Discontinue Diflucan, terconazole prescription ready to be sent to her pharmacy of choice

## 2013-10-06 NOTE — Telephone Encounter (Signed)
Patient called and stated that the doxycycline (VIBRA-TABS) 100 MG tablet is making her have a yeast infection. Patient wanted to see if dr hopper could call something in for her.  Pharmacy [facility] [phone] - [place], [place] - [address]

## 2013-10-06 NOTE — Addendum Note (Signed)
Addended by: Eustace Quail on: 10/06/2013 01:47 PM   Modules accepted: Orders

## 2013-10-06 NOTE — Telephone Encounter (Signed)
Done. DJR  

## 2013-10-06 NOTE — Addendum Note (Signed)
Addended by: Eustace Quail on: 10/06/2013 04:57 PM   Modules accepted: Orders, Medications

## 2013-10-15 ENCOUNTER — Ambulatory Visit
Admission: RE | Admit: 2013-10-15 | Discharge: 2013-10-15 | Disposition: A | Discharge status: Home / Self Care | Payer: BC Managed Care – PPO | Source: Ambulatory Visit

## 2013-10-15 DIAGNOSIS — Z9889 Other specified postprocedural states: Secondary | ICD-10-CM

## 2013-10-15 DIAGNOSIS — Z1231 Encounter for screening mammogram for malignant neoplasm of breast: Secondary | ICD-10-CM

## 2013-10-18 ENCOUNTER — Encounter: Payer: Self-pay | Admitting: Internal Medicine

## 2014-05-20 ENCOUNTER — Telehealth: Payer: Self-pay | Admitting: *Deleted

## 2014-05-20 NOTE — Telephone Encounter (Signed)
Medication  Refilled for 1 months patient has made an appt for 06/02/14

## 2014-05-27 ENCOUNTER — Encounter: Payer: Self-pay | Admitting: Internal Medicine

## 2014-05-27 ENCOUNTER — Ambulatory Visit (INDEPENDENT_AMBULATORY_CARE_PROVIDER_SITE_OTHER): Payer: BC Managed Care – PPO | Admitting: Internal Medicine

## 2014-05-27 VITALS — BP 139/84 | HR 98 | Temp 97.9°F | Wt 221.0 lb

## 2014-05-27 DIAGNOSIS — R110 Nausea: Secondary | ICD-10-CM

## 2014-05-27 LAB — CBC WITH DIFFERENTIAL/PLATELET
Basophils Absolute: 0 10*3/uL (ref 0.0–0.1)
Basophils Relative: 0.5 % (ref 0.0–3.0)
Eosinophils Absolute: 0.1 10*3/uL (ref 0.0–0.7)
Eosinophils Relative: 1.1 % (ref 0.0–5.0)
HCT: 39 % (ref 36.0–46.0)
HEMOGLOBIN: 13.1 g/dL (ref 12.0–15.0)
Lymphocytes Relative: 36.5 % (ref 12.0–46.0)
Lymphs Abs: 2.1 10*3/uL (ref 0.7–4.0)
MCHC: 33.5 g/dL (ref 30.0–36.0)
MCV: 91.7 fl (ref 78.0–100.0)
MONOS PCT: 10.8 % (ref 3.0–12.0)
Monocytes Absolute: 0.6 10*3/uL (ref 0.1–1.0)
NEUTROS PCT: 51.1 % (ref 43.0–77.0)
Neutro Abs: 3 10*3/uL (ref 1.4–7.7)
Platelets: 225 10*3/uL (ref 150.0–400.0)
RBC: 4.26 Mil/uL (ref 3.87–5.11)
RDW: 13.1 % (ref 11.5–15.5)
WBC: 5.8 10*3/uL (ref 4.0–10.5)

## 2014-05-27 LAB — COMPREHENSIVE METABOLIC PANEL
ALT: 25 U/L (ref 0–35)
AST: 19 U/L (ref 0–37)
Albumin: 4.1 g/dL (ref 3.5–5.2)
Alkaline Phosphatase: 72 U/L (ref 39–117)
BUN: 8 mg/dL (ref 6–23)
CALCIUM: 9.5 mg/dL (ref 8.4–10.5)
CHLORIDE: 105 meq/L (ref 96–112)
CO2: 30 meq/L (ref 19–32)
CREATININE: 0.8 mg/dL (ref 0.4–1.2)
GFR: 98.57 mL/min (ref >60.00)
Glucose, Bld: 63 mg/dL — ABNORMAL LOW (ref 70–99)
Potassium: 3.6 mEq/L (ref 3.5–5.1)
Sodium: 138 mEq/L (ref 135–145)
Total Bilirubin: 0.4 mg/dL (ref 0.2–1.2)
Total Protein: 7.4 g/dL (ref 6.0–8.3)

## 2014-05-27 LAB — LIPASE: LIPASE: 29 U/L (ref 11.0–59.0)

## 2014-05-27 LAB — AMYLASE: AMYLASE: 90 U/L (ref 27–131)

## 2014-05-27 NOTE — Progress Notes (Signed)
Pre visit review using our clinic review tool, if applicable. No additional management support is needed unless otherwise documented below in the visit note. 

## 2014-05-27 NOTE — Patient Instructions (Signed)
Get your blood work before you leave    Take prilosec every day x 6 weeks before breakfast

## 2014-05-27 NOTE — Progress Notes (Signed)
Subjective:    Patient ID: Desiree Dunn, female    DOB: 02/02/1971, 43 y.o.   MRN: [id number]  DOS:  05/27/2014 Type of visit - description: routine , to discuss nausea History: Symptoms are going on for years.  Very frequently in the mornings she wakes up w/ nausea, no vomiting. Because of that, she usually skips breakfast and   drink some ginger ale. Symptoms are not associated with headaches And got  worse after her hysterectomy about 4 years ago. Interestingly, she also has chronic right lower quadrant abdominal pain which is still there on and off. Denies any nausea postprandially. She takes occasional Motrin but no > than 3/week.    ROS denies vomiting, abdominal pain, classic heartburn. Bowel movements are normal and daily. Denies dysuria, gross hematuria or difficulty urinating No anxiety or depression No weight loss.    Past Medical History  Diagnosis Date  . Miscarriage     x 1  . Ectopic pregnancy 1995     x 1 , R side   . Chronic RLQ pain     since 1995 when she had ectopic preg  . Pelvic congestion     h/o   . Diverticulitis   . GERD (gastroesophageal reflux disease)     Past Surgical History  Procedure Laterality Date  . Tubal ligation    . Breast reduction surgery  12/09  . Abdominal hysterectomy  12/2009    [facility]  . Right oophorectomy  12/2009    [facility]  . Colonoscopy  2001    X3 ; diverticulosis    History   Social History  . Marital Status: Married    Spouse Name: N/A    Number of Children: 2  . Years of Education: N/A   Occupational History  . [employer],teacher, PT   . works FT at [employer] Topics  . Smoking status: Never Smoker   . Smokeless tobacco: Never Used  . Alcohol Use: Yes     Comment: socially   . Drug Use: No  . Sexual Activity: Not on file   Other Topics Concern  . Not on file   Social History Narrative  . No narrative on file        Medication List       This  list is accurate as of: 05/27/14  5:23 PM.  Always use your most recent med list.               acetaminophen 500 MG tablet  Commonly known as:  TYLENOL  Take 500 mg by mouth every 6 (six) hours as needed. For pain     ALLEGRA 180 MG tablet  Generic drug:  fexofenadine  Take 180 mg by mouth as needed.     ASTEPRO 0.15 % Soln  Generic drug:  Azelastine HCl  Place 2 sprays into the nose 2 (two) times daily as needed. For congestion     augmented betamethasone dipropionate 0.05 % cream  Commonly known as:  DIPROLENE-AF  Apply topically 2 (two) times daily as needed.     Evening Primrose Oil 500 MG Caps  Take by mouth 2 (two) times daily.     hydrocortisone 2.5 % cream  Apply topically 2 (two) times daily.     multivitamin with minerals Tabs tablet  Take 1 tablet by mouth daily.     omeprazole 20 MG capsule  Commonly known as:  PRILOSEC  Take 1 capsule (20  mg total) by mouth daily.     verapamil 80 MG tablet  Commonly known as:  CALAN  Take 1 tablet (80 mg total) by mouth 3 (three) times daily.           Objective:   Physical Exam BP 139/84  Pulse 98  Temp(Src) 97.9 F (36.6 C)  Wt 221 lb (100.245 kg)  SpO2 96%  General -- alert, well-developed, NAD.  HEENT-- Not pale-jaundice Lungs -- normal respiratory effort, no intercostal retractions, no accessory muscle use, and normal breath sounds.  Heart-- normal rate, regular rhythm, no murmur.  Abdomen-- Not distended, good bowel sounds,soft, non-tender. No rebound or rigidity. No mass,organomegaly.  Extremities-- no pretibial edema bilaterally  Neurologic--  alert & oriented X3. Speech normal, gait appropriate for age, strength symmetric and appropriate for age.   Psych-- Cognition and judgment appear intact. Cooperative with normal attention span and concentration. No anxious or depressed appearing.     Assessment & Plan:

## 2014-05-27 NOTE — Assessment & Plan Note (Addendum)
Morning nausea for several years as described in the history of present illness. Chart is reviewed, has  chronic right lower quadrant abdominal pain ,  CT of the abdomen ~ a year ago was  okay,   saw surgery and no good explanation for her symptoms was found. Had an ultrasound in 2009, no gallbladder stones but she had a liver cyst. Etiology of nausea  is not clear, doesn't seem to have ulcer, gastritis or GERD. She does have headaches but the HAs are  better. Plan: Labs, ultrasound to rule out a gallbladder problem, consistent PPI use daily for 6 weeks. If no better consider GI eval

## 2014-05-31 ENCOUNTER — Encounter: Payer: Self-pay | Admitting: *Deleted

## 2014-05-31 ENCOUNTER — Ambulatory Visit
Admission: RE | Admit: 2014-05-31 | Discharge: 2014-05-31 | Disposition: A | Discharge status: Home / Self Care | Payer: BC Managed Care – PPO | Source: Ambulatory Visit | Attending: Internal Medicine | Admitting: Internal Medicine

## 2014-05-31 DIAGNOSIS — R110 Nausea: Secondary | ICD-10-CM

## 2014-06-02 ENCOUNTER — Encounter: Payer: Self-pay | Admitting: Neurology

## 2014-06-02 ENCOUNTER — Ambulatory Visit (INDEPENDENT_AMBULATORY_CARE_PROVIDER_SITE_OTHER): Payer: BC Managed Care – PPO | Admitting: Neurology

## 2014-06-02 ENCOUNTER — Ambulatory Visit (INDEPENDENT_AMBULATORY_CARE_PROVIDER_SITE_OTHER): Payer: BC Managed Care – PPO

## 2014-06-02 VITALS — BP 124/92 | HR 87 | Resp 16 | Ht 64.5 in | Wt 220.0 lb

## 2014-06-02 DIAGNOSIS — G43009 Migraine without aura, not intractable, without status migrainosus: Secondary | ICD-10-CM | POA: Insufficient documentation

## 2014-06-02 DIAGNOSIS — I509 Heart failure, unspecified: Secondary | ICD-10-CM

## 2014-06-02 MED ORDER — VERAPAMIL HCL 80 MG PO TABS: 80.0000 mg | ORAL_TABLET | Freq: Three times a day (TID) | ORAL | Status: DC

## 2014-06-02 NOTE — Progress Notes (Signed)
NEUROLOGY FOLLOW UP OFFICE NOTE  Desiree Dunn [id number]  HISTORY OF PRESENT ILLNESS: Desiree Dunn is a 43 year old right-handed woman with history of GERD who follows up for migraine.    UPDATE: Intensity:  5/10 Duration:  2 hours Frequency:  3 headache days per month (no severe headaches)  Current abortive therapy:  Tylenol or ibuprofen (effective) Current preventative therapy:  Verapamil 80mg  three times daily  Caffeine:  rare Sleep hygiene:  better Depression/stress:  Less stress now since out of work for the summer Diet:  improved Exercise:  improved  HISTORY: Onset:  Since teenager, but worse last year.  Location:  bilateral retro-orbital and bi-temporal Quality:  Pounding, pressure Initial intensity:  Initially 7/10, now 5/10 since verapamil) Aura:  no Associated symptoms:  Photophobia, phonophobia, sometimes nausea Initial duration:  Several hours Initial frequency:  Severe attacks initially 2-3x/month, now 2x/month since verapamil.  Has less intense headaches about 10 days out of month Triggers/exacerbating factors:  Stress, dairy Relieving factors:  Rest, fresh air, cold compress  Past abortive therapy:  imitrex (ineffective), ibuprofen (worked but told to stop) Past preventative therapy:  none  Family history of headache:  no  08/06/13  CT of Maxillofacial was performed, which was unremarkable. 11/28/12 ECG NSR with QTc interval 452  PAST MEDICAL HISTORY: Past Medical History  Diagnosis Date  . Miscarriage     x 1  . Ectopic pregnancy 1995     x 1 , R side   . Chronic RLQ pain     since 1995 when she had ectopic preg  . Pelvic congestion     h/o   . Diverticulitis   . GERD (gastroesophageal reflux disease)     MEDICATIONS: Current Outpatient Prescriptions on File Prior to Visit  Medication Sig Dispense Refill  . acetaminophen (TYLENOL) 500 MG tablet Take 500 mg by mouth every 6 (six) hours as needed. For pain      . augmented  betamethasone dipropionate (DIPROLENE-AF) 0.05 % cream Apply topically 2 (two) times daily as needed.      . Azelastine HCl (ASTEPRO) 0.15 % SOLN Place 2 sprays into the nose 2 (two) times daily as needed. For congestion      . Evening Primrose Oil 500 MG CAPS Take by mouth 2 (two) times daily.      . fexofenadine (ALLEGRA) 180 MG tablet Take 180 mg by mouth as needed.      . hydrocortisone 2.5 % cream Apply topically 2 (two) times daily.  30 g  0  . Multiple Vitamin (MULTIVITAMIN WITH MINERALS) TABS Take 1 tablet by mouth daily.      [name] omeprazole (PRILOSEC) 20 MG capsule Take 1 capsule (20 mg total) by mouth daily.  30 capsule  6   No current facility-administered medications on file prior to visit.    ALLERGIES: Allergies  Allergen Reactions  . Amoxicillin     Hives    FAMILY HISTORY: Family History  Problem Relation Age of Onset  . Stroke Father   . Diabetes Father   . Heart attack Neg Hx   . Hypertension Father   . Hypertension Sister   . Colon cancer Mother   . Breast cancer      GM  . Colon polyps Brother   . Colitis Brother     SOCIAL HISTORY: History   Social History  . Marital Status: Married    Spouse Name: N/A    Number of Children: 2  . Years  of Education: N/A   Occupational History  . [employer],teacher, PT   . works FT at [employer] Topics  . Smoking status: Never Smoker   . Smokeless tobacco: Never Used  . Alcohol Use: Yes     Comment: socially   . Drug Use: No  . Sexual Activity: Not on file   Other Topics Concern  . Not on file   Social History Narrative  . No narrative on file    REVIEW OF SYSTEMS: Constitutional: No fevers, chills, or sweats, no generalized fatigue, change in appetite Eyes: No visual changes, double vision, eye pain Ear, nose and throat: No hearing loss, ear pain, nasal congestion, sore throat Cardiovascular: No chest pain, palpitations Respiratory:  No shortness of breath at rest  or with exertion, wheezes GastrointestinaI: No nausea, vomiting, diarrhea, abdominal pain, fecal incontinence Genitourinary:  No dysuria, urinary retention or frequency Musculoskeletal:  No neck pain, back pain Integumentary: No rash, pruritus, skin lesions Neurological: as above Psychiatric: No depression, insomnia, anxiety Endocrine: No palpitations, fatigue, diaphoresis, mood swings, change in appetite, change in weight, increased thirst Hematologic/Lymphatic:  No anemia, purpura, petechiae. Allergic/Immunologic: no itchy/runny eyes, nasal congestion, recent allergic reactions, rashes  PHYSICAL EXAM: Filed Vitals:   06/02/14 1010  BP: 124/92  Pulse: 87  Resp: 16   General: No acute distress Head:  Normocephalic/atraumatic Neck: supple, no paraspinal tenderness, full range of motion Heart:  Regular rate and rhythm Lungs:  Clear to auscultation bilaterally Back: No paraspinal tenderness Neurological Exam: alert and oriented to person, place, and time. Attention span and concentration intact, recent and remote memory intact, fund of knowledge intact.  Speech fluent and not dysarthric, language intact.  CN II-XII intact. Fundoscopic exam unremarkable without vessel changes, exudates, hemorrhages or papilledema.  Bulk and tone normal, muscle strength 5/5 throughout.  Sensation to light touch, temperature and vibration intact.  Deep tendon reflexes 2+ throughout, toes downgoing.  Finger to nose and heel to shin testing intact.  Gait normal, Romberg negative.  IMPRESSION: Migraine without aura, controlled  PLAN: 1.  Continue verapamil 80mg  three times daily (ordered 5 refills) 2.  Will check routine ECG (as she is on the verapamil) 3.  Follow up in 6 months  Metta Clines, DO  CC:  Kathlene November, MD

## 2014-06-02 NOTE — Patient Instructions (Signed)
1.  We will continue the verapamil 80mg  three times daily 2.  We will check a routine EKG to look for anything that would prompt switching to another medication. 3.  Follow up in 6 months.

## 2014-06-26 ENCOUNTER — Encounter: Payer: Self-pay | Admitting: Family Medicine

## 2014-06-26 ENCOUNTER — Ambulatory Visit (INDEPENDENT_AMBULATORY_CARE_PROVIDER_SITE_OTHER): Payer: BC Managed Care – PPO | Admitting: Family Medicine

## 2014-06-26 VITALS — BP 134/90 | Temp 98.5°F | Wt 221.0 lb

## 2014-06-26 DIAGNOSIS — N61 Mastitis without abscess: Secondary | ICD-10-CM

## 2014-06-26 DIAGNOSIS — Z23 Encounter for immunization: Secondary | ICD-10-CM

## 2014-06-26 MED ORDER — MUPIROCIN 2 % EX OINT: 1.0000 "application " | TOPICAL_OINTMENT | Freq: Two times a day (BID) | CUTANEOUS | Status: DC

## 2014-06-26 MED ORDER — CEPHALEXIN 500 MG PO CAPS: 500.0000 mg | ORAL_CAPSULE | Freq: Three times a day (TID) | ORAL | Status: DC

## 2014-06-26 NOTE — Progress Notes (Signed)
   Subjective:    Patient ID: Desiree Dunn, female    DOB: 24-Sep-1971, 43 y.o.   MRN: [id number]  HPI Acute visit. Patient complains of blister area right lower breast. She noticed this just couple days ago while she was in the shower.  No known injury. No fevers or chills. She states she had similar type process last November. She eventually saw a dermatologist and was treated with some type of oral antibiotic and topical and eventually improved. She had impression that this was some type of staph. No hx of MRSA.   Denies any other generalized rash.  Past Medical History  Diagnosis Date  . Miscarriage     x 1  . Ectopic pregnancy 1995     x 1 , R side   . Chronic RLQ pain     since 1995 when she had ectopic preg  . Pelvic congestion     h/o   . Diverticulitis   . GERD (gastroesophageal reflux disease)    Past Surgical History  Procedure Laterality Date  . Tubal ligation    . Breast reduction surgery  12/09  . Abdominal [facility]  12/[facility]    [facility]  . Right oophorectomy  12/2009    [facility]  . Colonoscopy  2001    X3 ; diverticulosis    reports that she has never smoked. She has never used smokeless tobacco. She reports that she drinks alcohol. She reports that she does not use illicit drugs. family history includes Breast cancer in an other family member; Colitis in her brother; Colon cancer in her mother; Colon polyps in her brother; Diabetes in her father; Hypertension in her father and sister; Stroke in her father. There is no history of Heart attack. Allergies  Allergen Reactions  . Amoxicillin     Hives      Review of Systems  Constitutional: Negative for fever and chills.       Objective:   Physical Exam  Constitutional: She appears well-developed and well-nourished. No distress.  Cardiovascular: Normal rate and regular rhythm.   Pulmonary/Chest: Effort normal and breath sounds normal. No respiratory distress. She has no wheezes. She has no rales.  Skin:   Patient has vesicular lesion around 8:00 position of the right breast along the inferior margin. This is approximately 2 x 3 cm. She has a surrounding area of erythema. Minimally tender. No pustules          Assessment & Plan:  Bullous impetigo-type lesion right lower breast. She has some surrounding cellulitis changes. Keflex 500 mg 3 times a day for 10 days. Bactroban ointment 2-3 times daily after blister ruptures

## 2014-06-26 NOTE — Progress Notes (Signed)
Pre visit review using our clinic review tool, if applicable. No additional management support is needed unless otherwise documented below in the visit note. 

## 2014-06-26 NOTE — Patient Instructions (Signed)
Do not attempt to disrupt blister. Follow up promptly for any fever or increased redness

## 2014-07-28 DIAGNOSIS — Z23 Encounter for immunization: Secondary | ICD-10-CM

## 2014-07-28 NOTE — Addendum Note (Signed)
Addended by: Peggyann Shoals on: 07/28/2014 04:43 PM   Modules accepted: Orders

## 2014-08-16 ENCOUNTER — Encounter: Payer: BC Managed Care – PPO | Admitting: Internal Medicine

## 2014-09-03 ENCOUNTER — Ambulatory Visit: Payer: BC Managed Care – PPO

## 2014-09-20 ENCOUNTER — Other Ambulatory Visit: Payer: Self-pay

## 2014-09-20 DIAGNOSIS — Z9889 Other specified postprocedural states: Secondary | ICD-10-CM

## 2014-09-20 DIAGNOSIS — Z1231 Encounter for screening mammogram for malignant neoplasm of breast: Secondary | ICD-10-CM

## 2014-10-06 ENCOUNTER — Ambulatory Visit (INDEPENDENT_AMBULATORY_CARE_PROVIDER_SITE_OTHER): Payer: BC Managed Care – PPO | Admitting: Medical

## 2014-10-06 ENCOUNTER — Encounter: Payer: Self-pay | Admitting: Medical

## 2014-10-06 VITALS — BP 151/73 | HR 84 | Temp 98.2°F | Ht 65.5 in | Wt 220.0 lb

## 2014-10-06 DIAGNOSIS — J209 Acute bronchitis, unspecified: Secondary | ICD-10-CM

## 2014-10-06 MED ORDER — TERCONAZOLE 0.4 % VA CREA: 1.0000 | TOPICAL_CREAM | Freq: Every day | VAGINAL | Status: DC

## 2014-10-06 MED ORDER — ALBUTEROL SULFATE HFA 108 (90 BASE) MCG/ACT IN AERS: 2.0000 | INHALATION_SPRAY | Freq: Four times a day (QID) | RESPIRATORY_TRACT | Status: DC | PRN

## 2014-10-06 MED ORDER — FLUTICASONE PROPIONATE 50 MCG/ACT NA SUSP: 2.0000 | Freq: Every day | NASAL | Status: DC

## 2014-10-06 MED ORDER — BENZONATATE 100 MG PO CAPS: 100.0000 mg | ORAL_CAPSULE | Freq: Three times a day (TID) | ORAL | Status: DC | PRN

## 2014-10-06 MED ORDER — AZITHROMYCIN 250 MG PO TABS: ORAL_TABLET | ORAL | Status: DC

## 2014-10-06 NOTE — Assessment & Plan Note (Signed)
Patient appear to have some likely bronchitis which may have followed upper respiratory infection or allergies.  I am prescribing flonase for nasal congestion, Benzonatate for cough, and azithromycin antibiotic.  For your occasional wheeze, I am making albuterol inhaler available.

## 2014-10-06 NOTE — Progress Notes (Signed)
Pre visit review using our clinic review tool, if applicable. No additional management support is needed unless otherwise documented below in the visit note. 

## 2014-10-06 NOTE — Patient Instructions (Signed)
Your appear to have some likely bronchitis which may have followed upper respiratory infection or allergies.  I am prescribing flonase for nasal congestion, Benzonatate for cough, and azithromycin antibiotic.  For your occasional wheeze, I am making albuterol inhaler available.  Follow up in 7 days or as needed.

## 2014-10-06 NOTE — Progress Notes (Signed)
Subjective:    Patient ID: Desiree Dunn, female    DOB: 1971-08-01, 43 y.o.   MRN: [id number]  HPI  Cough x 2 wks. Otc products not helping. Nasal and chest congestion. Dry cough. Nonsmoker. No hx of asthma. Pt had mild transient light wheeze.   Some sneezing. No fever, no chills or sweats.  LMP- hysterectomy. None since the procedure 4 years ago.  Pt takes allegra every day for allergies.  Past Medical History  Diagnosis Date  . Miscarriage     x 1  . Ectopic pregnancy 1995     x 1 , R side   . Chronic RLQ pain     since 1995 when she had ectopic preg  . Pelvic congestion     h/o   . Diverticulitis   . GERD (gastroesophageal reflux disease)     History   Social History  . Marital Status: Married    Spouse Name: N/A    Number of Children: 2  . Years of Education: N/A   Occupational History  . [employer],teacher, PT   . works FT at [employer] Topics  . Smoking status: Never Smoker   . Smokeless tobacco: Never Used  . Alcohol Use: Yes     Comment: socially   . Drug Use: No  . Sexual Activity: Not on file   Other Topics Concern  . Not on file   Social History Narrative    Past Surgical History  Procedure Laterality Date  . Tubal ligation    . Breast reduction surgery  12/09  . Abdominal hysterectomy  12/2009    [facility]  . Right oophorectomy  12/2009    [facility]  . Colonoscopy  2001    X3 ; diverticulosis    Family History  Problem Relation Age of Onset  . Stroke Father   . Diabetes Father   . Heart attack Neg Hx   . Hypertension Father   . Hypertension Sister   . Colon cancer Mother   . Breast cancer      GM  . Colon polyps Brother   . Colitis Brother     Allergies  Allergen Reactions  . Amoxicillin     Hives    Current Outpatient Prescriptions on File Prior to Visit  Medication Sig Dispense Refill  . acetaminophen (TYLENOL) 500 MG tablet Take 500 mg by mouth every 6 (six) hours as needed. For  pain    . Azelastine HCl (ASTEPRO) 0.15 % SOLN Place 2 sprays into the nose 2 (two) times daily as needed. For congestion    . fexofenadine (ALLEGRA) 180 MG tablet Take 180 mg by mouth as needed.    . Multiple Vitamin (MULTIVITAMIN WITH MINERALS) TABS Take 1 tablet by mouth daily.    [name] omeprazole (PRILOSEC) 20 MG capsule Take 1 capsule (20 mg total) by mouth daily. 30 capsule 6  . verapamil (CALAN) 80 MG tablet Take 1 tablet (80 mg total) by mouth 3 (three) times daily. 90 tablet 5  . augmented betamethasone dipropionate (DIPROLENE-AF) 0.05 % cream Apply topically 2 (two) times daily as needed.    . Evening Primrose Oil 500 MG CAPS Take by mouth 2 (two) times daily.    . hydrocortisone 2.5 % cream Apply topically 2 (two) times daily. (Patient not taking: Reported on 10/06/2014) 30 g 0  . mupirocin ointment (BACTROBAN) 2 % Place 1 application into the nose 2 (two) times daily. (Patient not  taking: Reported on 10/06/2014) 22 g 0   No current facility-administered medications on file prior to visit.    BP 151/73 mmHg  Pulse 84  Temp(Src) 98.2 F (36.8 C) (Oral)  Ht 5' 5.5" (1.664 m)  Wt 220 lb (99.791 kg)  BMI 36.04 kg/m2  SpO2 96%     Review of Systems  Constitutional: Negative for fever, chills and fatigue.  HENT: Positive for congestion and sneezing. Negative for ear discharge, ear pain, nosebleeds, postnasal drip, rhinorrhea, sinus pressure, sore throat and trouble swallowing.   Respiratory: Positive for cough and wheezing. Negative for chest tightness and shortness of breath.   Cardiovascular: Negative for chest pain and palpitations.  Musculoskeletal: Negative for back pain.  Neurological: Negative for dizziness, syncope, weakness, light-headedness and headaches.  Psychiatric/Behavioral: Negative for dysphoric mood.       Objective:   Physical Exam   General  Mental Status - Alert. General Appearance - Well groomed. Not in acute distress.  Skin Rashes- No  Rashes.  HEENT Head- Normal. Ear Auditory Canal - Left- Normal. Right - Normal.Tympanic Membrane- Left- Normal. Right- Normal. Eye Sclera/Conjunctiva- Left- Normal. Right- Normal. Nose & Sinuses Nasal Mucosa- Left- Boggy + Congested. Right- Boggy + Congested. Mouth & Throat Lips: Upper Lip- Normal: no dryness, cracking, pallor, cyanosis, or vesicular eruption. Lower Lip-Normal: no dryness, cracking, pallor, cyanosis or vesicular eruption. Buccal Mucosa- Bilateral- No Aphthous ulcers. Oropharynx- No Discharge or Erythema. Tonsils: Characteristics- Bilateral- No Erythema or Congestion. Size/Enlargement- Bilateral- No enlargement. Discharge- bilateral-None.  Neck Neck- Supple. No Masses. Mild faint submandibular nodes palpable and faint tender.   Chest and Lung Exam Auscultation: Breath Sounds:-Normal. Even unlabored except upper lobe rhonchi Cardiovascular Auscultation:Rythm- Regular, Rate and Rythm  Murmurs & Other Heart Sounds:Ausculatation of the heart reveal- No Murmurs.  Lymphatic Head & Neck General Head & Neck Lymphatics: Bilateral: Description- No Localized lymphadenopathy.         Assessment & Plan:

## 2014-10-11 ENCOUNTER — Other Ambulatory Visit: Payer: Self-pay

## 2014-10-14 ENCOUNTER — Telehealth: Payer: Self-pay | Admitting: Internal Medicine

## 2014-10-14 NOTE — Telephone Encounter (Signed)
Caller name:mcmillan, Martine Relation to SU:NHRV Call back number:[phone] Pharmacy:[facility]  Reason for call: pt was seen 11/25 by edward, states she has taking all of her antibiotics and she still has a bad cough, pt would like to know if something can be called in for her for the coughing.

## 2014-10-15 NOTE — Telephone Encounter (Signed)
i talked with pt. She has cough despite benzonatate 100 mg q 8 hrs. So I advised her use 200 mg q 8 hrs prn cough. She denied any wheezing or sob. If by Monday she has persisting cough notify us and advised her would print out hydrocodone based syrup and then she could pick up on Monday.

## 2014-10-18 ENCOUNTER — Telehealth: Payer: Self-pay | Admitting: Medical

## 2014-10-18 MED ORDER — BENZONATATE 200 MG PO CAPS: 200.0000 mg | ORAL_CAPSULE | Freq: Three times a day (TID) | ORAL | Status: DC | PRN

## 2014-10-18 NOTE — Telephone Encounter (Signed)
Call in more benzonatate. Pt better with higher dose cough controlled.

## 2014-10-18 NOTE — Telephone Encounter (Signed)
Patient states that Percell Miller increased the dosage of cough pills and now she is out. She would like more called in. Patient states that she is feeling better but cannot get rid of cough.

## 2014-10-19 ENCOUNTER — Other Ambulatory Visit: Payer: Self-pay

## 2014-10-19 MED ORDER — BENZONATATE 200 MG PO CAPS: 200.0000 mg | ORAL_CAPSULE | Freq: Three times a day (TID) | ORAL | Status: DC | PRN

## 2014-10-19 NOTE — Telephone Encounter (Signed)
Medication refilled. Sent to pharmacy.

## 2014-11-01 ENCOUNTER — Ambulatory Visit
Admission: RE | Admit: 2014-11-01 | Discharge: 2014-11-01 | Disposition: A | Discharge status: Home / Self Care | Payer: BC Managed Care – PPO | Source: Ambulatory Visit

## 2014-11-01 DIAGNOSIS — Z1231 Encounter for screening mammogram for malignant neoplasm of breast: Secondary | ICD-10-CM

## 2014-11-01 DIAGNOSIS — Z9889 Other specified postprocedural states: Secondary | ICD-10-CM

## 2014-11-03 ENCOUNTER — Ambulatory Visit (INDEPENDENT_AMBULATORY_CARE_PROVIDER_SITE_OTHER): Payer: BC Managed Care – PPO | Admitting: Internal Medicine

## 2014-11-03 ENCOUNTER — Encounter: Payer: Self-pay | Admitting: Internal Medicine

## 2014-11-03 VITALS — BP 118/73 | HR 85 | Temp 98.0°F | Ht 66.0 in | Wt 223.4 lb

## 2014-11-03 DIAGNOSIS — G43009 Migraine without aura, not intractable, without status migrainosus: Secondary | ICD-10-CM

## 2014-11-03 DIAGNOSIS — Z0000 Encounter for general adult medical examination without abnormal findings: Secondary | ICD-10-CM

## 2014-11-03 DIAGNOSIS — J209 Acute bronchitis, unspecified: Secondary | ICD-10-CM

## 2014-11-03 NOTE — Patient Instructions (Signed)
stop by the front desk and schedule labs to be done within few days (fasting)  Please come back to the office in 1 year  for a physical exam. Come back fasting     MYFITNESSPAL? Exercise- 3 hours every week

## 2014-11-03 NOTE — Assessment & Plan Note (Addendum)
Td 2014 Had a Flu shot  Last visit w/  gyn  ~ 4 years ago, s/p hysterectomy and unilateral oophorectomy for benign reason. Had a  MMG few days ago, results pending, breast exam neg today Plan-- asses need for a pelvic in the future   FH colon ca and polyps: Cscope 2001, 2006 and again 11-2009 d/t  rectal bleed, neg except for tics----- next 5 years   Diet  Discussed-- myfitnesspal? labs

## 2014-11-03 NOTE — Assessment & Plan Note (Signed)
Better, still coughing a little. Was provided albuterol as needed

## 2014-11-03 NOTE — Progress Notes (Signed)
Pre visit review using our clinic review tool, if applicable. No additional management support is needed unless otherwise documented below in the visit note. 

## 2014-11-03 NOTE — Assessment & Plan Note (Signed)
On Verapamil as needed

## 2014-11-03 NOTE — Progress Notes (Signed)
Subjective:    Patient ID: Desiree Dunn, female    DOB: Mar 07, 1971, 43 y.o.   MRN: [id number]  DOS:  11/03/2014 Type of visit - description : cpx Interval history: In general feeling well, unable to lose weight. Was recently seen with bronchitis, better, still has some cough.   ROS Denies chest pain or difficulty breathing Currently without nausea, vomiting, diarrhea or blood in the stools. Denies anxiety or depression No dysuria, gross hematuria or difficulty urinating. No vaginal discharge or bleeding. + Nocturia sometimes.  Past Medical History  Diagnosis Date  . Miscarriage     x 1  . Ectopic pregnancy 1995     x 1 , R side   . Chronic RLQ pain     since 1995 when she had ectopic preg  . Pelvic congestion     h/o   . Diverticulitis   . GERD (gastroesophageal reflux disease)   . Migraine without aura and without status migrainosus, not intractable   . Obesity, unspecified 07/06/2010    Past Surgical History  Procedure Laterality Date  . Tubal ligation    . Breast reduction surgery  12/09  . Abdominal hysterectomy  12/2009    [facility]  . Right oophorectomy  12/2009    [facility]  . Colonoscopy  2001    X3 ; diverticulosis    History   Social History  . Marital Status: Married    Spouse Name: N/A    Number of Children: 2  . Years of Education: N/A   Occupational History  . 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 Smoking status: Never Smoker   . Smokeless tobacco: Never Used  . Alcohol Use: Yes     Comment: socially   . Drug Use: No  . Sexual Activity: Not on file   Other Topics Concern  . Not on file   Social History Narrative   Household-- pt , husband and children   daughter 69   son 64     Family History  Problem Relation Age of Onset  . Stroke Father   . Diabetes Father   . Heart attack Neg Hx   . Hypertension Father   . Hypertension Sister   . Colon cancer Mother   . Breast  cancer Other     GM  . Colon polyps Brother   . Colitis Brother        Medication List       This list is accurate as of: 11/03/14 11:59 PM.  Always use your most recent med list.               acetaminophen 500 MG tablet  Commonly known as:  TYLENOL  Take 500 mg by mouth every 6 (six) hours as needed. For pain     albuterol 108 (90 BASE) MCG/ACT inhaler  Commonly known as:  PROVENTIL HFA;VENTOLIN HFA  Inhale 2 puffs into the lungs every 6 (six) hours as needed for wheezing or shortness of breath.     ALLEGRA 180 MG tablet  Generic drug:  fexofenadine  Take 180 mg by mouth as needed.     ASTEPRO 0.15 % Soln  Generic drug:  Azelastine HCl  Place 2 sprays into the nose 2 (two) times daily as needed. For congestion     benzonatate 200 MG capsule  Commonly known as:  TESSALON  Take 1 capsule (200 mg total) by mouth  3 (three) times daily as needed for cough.     Evening Primrose Oil 500 MG Caps  Take by mouth 2 (two) times daily.     fluticasone 50 MCG/ACT nasal spray  Commonly known as:  FLONASE  Place 2 sprays into both nostrils daily.     multivitamin with minerals Tabs tablet  Take 1 tablet by mouth daily.     omeprazole 20 MG capsule  Commonly known as:  PRILOSEC  Take 1 capsule (20 mg total) by mouth daily.     verapamil 80 MG tablet  Commonly known as:  CALAN  Take 1 tablet (80 mg total) by mouth 3 (three) times daily.           Objective:   Physical Exam BP 118/73 mmHg  Pulse 85  Temp(Src) 98 F (36.7 C) (Oral)  Ht 5\' 6"  (1.676 m)  Wt 223 lb 6 oz (101.322 kg)  BMI 36.07 kg/m2  SpO2 97% General -- alert, well-developed, NAD.  Neck --no thyromegaly  HEENT-- Not pale.  Breast-- no dominant mass, skin and nipples normal to inspection on palpation, axillary areas without mass or lymphadenopathy; well-healed surgical scars Lungs -- normal respiratory effort, no intercostal retractions, no accessory muscle use, and normal breath sounds.  Heart--  normal rate, regular rhythm, no murmur.  Abdomen-- Not distended, good bowel sounds,soft, non-tender. Extremities-- no pretibial edema bilaterally  Neurologic--  alert & oriented X3. Speech normal, gait appropriate for age, strength symmetric and appropriate for age.  Psych-- Cognition and judgment appear intact. Cooperative with normal attention span and concentration. No anxious or depressed appearing.       Assessment & Plan:

## 2014-11-04 ENCOUNTER — Other Ambulatory Visit (INDEPENDENT_AMBULATORY_CARE_PROVIDER_SITE_OTHER): Payer: BC Managed Care – PPO

## 2014-11-04 DIAGNOSIS — Z0000 Encounter for general adult medical examination without abnormal findings: Secondary | ICD-10-CM

## 2014-11-04 LAB — LIPID PANEL
CHOLESTEROL: 181 mg/dL (ref 0–200)
HDL: 58.5 mg/dL (ref >39.00)
LDL Cholesterol: 99 mg/dL (ref 0–99)
NonHDL: 122.5
Total CHOL/HDL Ratio: 3
Triglycerides: 118 mg/dL (ref 0.0–149.0)
VLDL: 23.6 mg/dL (ref 0.0–40.0)

## 2014-11-08 ENCOUNTER — Other Ambulatory Visit (INDEPENDENT_AMBULATORY_CARE_PROVIDER_SITE_OTHER): Payer: BC Managed Care – PPO

## 2014-11-08 DIAGNOSIS — Z0000 Encounter for general adult medical examination without abnormal findings: Secondary | ICD-10-CM

## 2014-11-08 LAB — URINALYSIS, ROUTINE W REFLEX MICROSCOPIC
BILIRUBIN URINE: NEGATIVE
Hgb urine dipstick: NEGATIVE
Ketones, ur: NEGATIVE
Leukocytes, UA: NEGATIVE
Nitrite: POSITIVE — AB
Specific Gravity, Urine: <=1.005 — AB (ref 1.000–1.030)
URINE GLUCOSE: NEGATIVE
Urobilinogen, UA: 0.2 (ref 0.0–1.0)

## 2014-11-11 LAB — URINALYSIS, ROUTINE W REFLEX MICROSCOPIC
BILIRUBIN URINE: NEGATIVE
HGB URINE DIPSTICK: NEGATIVE
Ketones, ur: NEGATIVE
Leukocytes, UA: NEGATIVE
Nitrite: NEGATIVE
RBC / HPF: NONE SEEN (ref 0–?)
Specific Gravity, Urine: <=1.005 — AB (ref 1.000–1.030)
TOTAL PROTEIN, URINE-UPE24: NEGATIVE
UROBILINOGEN UA: 0.2 (ref 0.0–1.0)
Urine Glucose: NEGATIVE
WBC UA: NONE SEEN (ref 0–?)
pH: 6.5 (ref 5.0–8.0)

## 2014-11-12 LAB — URINE CULTURE

## 2014-11-13 ENCOUNTER — Telehealth: Payer: Self-pay | Admitting: *Deleted

## 2014-11-13 MED ORDER — SULFAMETHOXAZOLE-TRIMETHOPRIM 800-160 MG PO TABS: 1.0000 | ORAL_TABLET | Freq: Two times a day (BID) | ORAL | Status: DC

## 2014-11-13 NOTE — Telephone Encounter (Signed)
Received a call from [facility] stating that patient did a UA in office at annual exam visit & was later called back and notified that her UA was lost & asked to come back to give another for culture. Pt states that she was also told that the part of the urine test done in the office showed bacteria & was told they would send in a n abx. Pt states that when she went to pharmacy to pick up antibiotic, nothing had been sent in. Please advise. It also looks like tha patient went back & repeated her UA & the results are now available. Please advise. Pt also reports that she is still having the urinary frequency. Allergic to amoxicillin/pcn.

## 2014-11-13 NOTE — Telephone Encounter (Signed)
Pt.notified

## 2014-11-13 NOTE — Telephone Encounter (Signed)
Pls call pt and tell her I'm sending bactrim to her pharmacy. If all sx's are not resolved when finished with this med, tell her she should return to her MD's office for a recheck.-thx

## 2014-12-03 ENCOUNTER — Ambulatory Visit: Payer: BC Managed Care – PPO | Admitting: Neurology

## 2014-12-24 ENCOUNTER — Encounter: Payer: Self-pay | Admitting: Gastroenterology

## 2014-12-31 ENCOUNTER — Encounter: Payer: Self-pay | Admitting: Gastroenterology

## 2015-01-03 ENCOUNTER — Ambulatory Visit: Payer: Self-pay | Admitting: Neurology

## 2015-01-03 ENCOUNTER — Telehealth: Payer: Self-pay | Admitting: Internal Medicine

## 2015-01-03 DIAGNOSIS — Z029 Encounter for administrative examinations, unspecified: Secondary | ICD-10-CM

## 2015-01-03 NOTE — Telephone Encounter (Signed)
Do you see where we have ever checked Pts blood type?

## 2015-01-03 NOTE — Telephone Encounter (Signed)
We don't know  her blood type. We can order a blood type if so desired, her insurance may refuse payment.

## 2015-01-03 NOTE — Telephone Encounter (Signed)
Caller name: Yanelis Relation to pt: self Call back number: [phone] Pharmacy:  Reason for call:   Patient wants to know what her blood type is??

## 2015-01-04 NOTE — Telephone Encounter (Signed)
LMOM informing Pt that we have not ever checked her blood type, however, that she may come into the office for a lab appt to have that determined, left message informing her that her insurance may not cover the cost of the blood typing and unsure of total cost for that.

## 2015-01-10 ENCOUNTER — Encounter: Payer: Self-pay | Admitting: Internal Medicine

## 2015-01-10 ENCOUNTER — Telehealth: Payer: Self-pay | Admitting: Neurology

## 2015-01-10 ENCOUNTER — Ambulatory Visit (INDEPENDENT_AMBULATORY_CARE_PROVIDER_SITE_OTHER): Payer: BLUE CROSS/BLUE SHIELD | Admitting: Internal Medicine

## 2015-01-10 VITALS — BP 132/79 | HR 108 | Temp 98.0°F | Ht 66.0 in | Wt 220.4 lb

## 2015-01-10 DIAGNOSIS — M545 Low back pain, unspecified: Secondary | ICD-10-CM

## 2015-01-10 MED ORDER — MELOXICAM 15 MG PO TABS: 15.0000 mg | ORAL_TABLET | Freq: Every day | ORAL | Status: DC | PRN

## 2015-01-10 NOTE — Progress Notes (Signed)
Pre visit review using our clinic review tool, if applicable. No additional management support is needed unless otherwise documented below in the visit note. 

## 2015-01-10 NOTE — Progress Notes (Signed)
Subjective:    Patient ID: Desiree Dunn, female    DOB: 05-04-1971, 44 y.o.   MRN: [id number]  DOS:  01/10/2015 Type of visit - description : acute  Interval history: 2 weeks history of back pain, located at the lower thoracic, upper lumbar area. Some radiation to the right hip. Worse when the weather is cold, one time was intense and she couldn't get up. The only thing she is doing different is taking walks more frequently. No lifting, injury or fall. No upper or lower extremity paresthesias No actual neck pain. No bladder or bowel incontinence.  Also, 2 weeks history of left shoulder pain anteriorly.   Review of Systems See HPI  Past Medical History  Diagnosis Date  . Miscarriage     x 1  . Ectopic pregnancy 1995     x 1 , R side   . Chronic RLQ pain     since 1995 when she had ectopic preg  . Pelvic congestion     h/o   . Diverticulitis   . GERD (gastroesophageal reflux disease)   . Migraine without aura and without status migrainosus, not intractable   . Obesity, unspecified 07/06/2010    Past Surgical History  Procedure Laterality Date  . Tubal ligation    . Breast reduction surgery  12/09  . Abdominal hysterectomy  12/2009    [facility]  . Right oophorectomy  12/2009    [facility]  . Colonoscopy  2001    X3 ; diverticulosis    History   Social History  . Marital Status: Married    Spouse Name: N/A  . Number of Children: 2  . Years of Education: N/A   Occupational History  . 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 Smoking status: Never Smoker   . Smokeless tobacco: Never Used  . Alcohol Use: Yes     Comment: socially   . Drug Use: No  . Sexual Activity: Not on file   Other Topics Concern  . Not on file   Social History Narrative   Household-- pt , husband and children   daughter 76   son 56        Medication List       This list is accurate as of: 01/10/15 11:59 PM.  Always use your  most recent med list.               acetaminophen 500 MG tablet  Commonly known as:  TYLENOL  Take 500 mg by mouth every 6 (six) hours as needed. For pain     Evening Primrose Oil 500 MG Caps  Take by mouth 2 (two) times daily.     meloxicam 15 MG tablet  Commonly known as:  MOBIC  Take 1 tablet (15 mg total) by mouth daily as needed for pain.     multivitamin with minerals Tabs tablet  Take 1 tablet by mouth daily.     omeprazole 20 MG capsule  Commonly known as:  PRILOSEC  Take 1 capsule (20 mg total) by mouth daily.           Objective:   Physical Exam BP 132/79 mmHg  Pulse 108  Temp(Src) 98 F (36.7 C) (Oral)  Ht 5\' 6"  (1.676 m)  Wt 220 lb 6 oz (99.961 kg)  BMI 35.59 kg/m2  SpO2 97% General:   Well developed, well nourished . NAD.  Neck-- Full  range of motion, no TTP at the cervical spine. Back--no TTP. Muscle skeletal: no pretibial edema bilaterally  Shoulders symmetric, range of motion normal, mild left shoulder pain with arm elevation. Slightly tender to palpation at the anterior L shoulder? Skin: Not pale. Not jaundice Neurologic:  alert & oriented X3.  Speech normal, gait appropriate for age and unassisted DTRs symmetric Psych--  Cognition and judgment appear intact.  Cooperative with normal attention span and concentration.  Behavior appropriate. No anxious or depressed appearing.       Assessment & Plan:    Back pain, Mid back pain without red flag symptoms, I showed her several ways to stretch her upper back and encouraged her to learn more about stretching , see instructions. Offered physical therapy referral to learn more stretching but that may be costly. Meloxicam as needed, GI precautions discussed If not improving she will let me know.  Shoulder pain, Meloxicam as needed, call if not improving, sports medicine referral?

## 2015-01-10 NOTE — Telephone Encounter (Signed)
Pt no showed 01/03/15 appt w/ Dr. Tomi Likens. No show letter + policy mailed to pt / Sherri        *enter charge / no show letter + no show policy

## 2015-01-10 NOTE — Patient Instructions (Signed)
Take meloxicam  as needed for pain.  Always take it with food because may cause gastritis and ulcers.  If you notice nausea, stomach pain, change in the color of stools --->  Stop the medicine and let us know  Do the stretching twice a day   Dana with information about home physical therapy for back pain: [email or web address]   Call of not getting better soon

## 2015-01-18 ENCOUNTER — Encounter: Payer: Self-pay | Admitting: *Deleted

## 2015-01-25 ENCOUNTER — Ambulatory Visit (AMBULATORY_SURGERY_CENTER): Payer: Self-pay | Admitting: *Deleted

## 2015-01-25 VITALS — Ht 64.5 in | Wt 219.4 lb

## 2015-01-25 DIAGNOSIS — Z800 Family history of malignant neoplasm of digestive organs: Secondary | ICD-10-CM

## 2015-01-25 MED ORDER — NA SULFATE-K SULFATE-MG SULF 17.5-3.13-1.6 GM/177ML PO SOLN: ORAL | Status: DC

## 2015-01-25 NOTE — Progress Notes (Signed)
No allergies to eggs or soy. No problems with anesthesia.  Pt given Emmi instructions for colonoscopy  No oxygen use  No diet drug use  

## 2015-02-08 ENCOUNTER — Encounter: Payer: Self-pay | Admitting: Gastroenterology

## 2015-02-08 ENCOUNTER — Ambulatory Visit (AMBULATORY_SURGERY_CENTER): Payer: BLUE CROSS/BLUE SHIELD | Admitting: Gastroenterology

## 2015-02-08 VITALS — BP 136/97 | HR 66 | Temp 99.0°F | Resp 38 | Ht 64.5 in | Wt 219.0 lb

## 2015-02-08 DIAGNOSIS — Z1211 Encounter for screening for malignant neoplasm of colon: Secondary | ICD-10-CM

## 2015-02-08 DIAGNOSIS — Z800 Family history of malignant neoplasm of digestive organs: Secondary | ICD-10-CM

## 2015-02-08 DIAGNOSIS — D129 Benign neoplasm of anus and anal canal: Secondary | ICD-10-CM

## 2015-02-08 DIAGNOSIS — K621 Rectal polyp: Secondary | ICD-10-CM

## 2015-02-08 DIAGNOSIS — D128 Benign neoplasm of rectum: Secondary | ICD-10-CM

## 2015-02-08 DIAGNOSIS — D126 Benign neoplasm of colon, unspecified: Secondary | ICD-10-CM

## 2015-02-08 MED ORDER — SODIUM CHLORIDE 0.9 % IV SOLN: 500.0000 mL | INTRAVENOUS | Status: DC

## 2015-02-08 NOTE — Progress Notes (Signed)
Called to room to assist during endoscopic procedure.  Patient ID and intended procedure confirmed with present staff. Received instructions for my participation in the procedure from the performing physician.  

## 2015-02-08 NOTE — Progress Notes (Signed)
To recovery, report to [facility], RN. VSS.

## 2015-02-08 NOTE — Op Note (Signed)
[place]  [address] [place], [id number]   COLONOSCOPY PROCEDURE REPORT  PATIENT: Desiree, Dunn  MR#: [id number] BIRTHDATE: [birth year] , 44  yrs. old GENDER: female ENDOSCOPIST: Inda Castle, MD REFERRED TZ:[facility]FV Larose Kells, M.D. PROCEDURE DATE:  02/08/2015 PROCEDURE:   Colonoscopy, screening and Colonoscopy with cold biopsy polypectomy First Screening Colonoscopy - Avg.  risk and is 50 yrs.  old or older - No.  Prior Negative Screening - Now for repeat screening. Above average risk  History of Adenoma - Now for follow-up colonoscopy & has been > or = to 3 yrs.  N/A ASA CLASS:   Class II INDICATIONS:FH Colon or Rectal Adenocarcinoma. MEDICATIONS: Monitored anesthesia care and Propofol 250 mg IV  DESCRIPTION OF PROCEDURE:   After the risks benefits and alternatives of the procedure were thoroughly explained, informed consent was obtained.  The digital rectal exam revealed no abnormalities of the rectum.   The [id number]  endoscope was introduced through the anus and advanced to the cecum, which was identified by both the appendix and ileocecal valve. No adverse events experienced.   The quality of the prep was (Suprep was used) excellent.  The instrument was then slowly withdrawn as the colon was fully examined.      COLON FINDINGS: A flat polyp measuring 2 mm in size was found in the rectum.  A polypectomy was performed with cold forceps.   The examination was otherwise normal.  Retroflexed views revealed no abnormalities. The time to cecum = 3.0 Withdrawal time = 7.9   The scope was withdrawn and the procedure completed. COMPLICATIONS: There were no immediate complications.  ENDOSCOPIC IMPRESSION: 1.   Flat polyp was found in the rectum; polypectomy was performed with cold forceps 2.   The examination was otherwise normal  RECOMMENDATIONS: Given your significant family history of colon cancer, you should have a repeat colonoscopy in 5  years  eSigned:  Inda Castle, MD 02/08/2015 3:16 PM   cc:

## 2015-02-08 NOTE — Patient Instructions (Signed)
Discharge instructions given. Handout on polyps. Resume previous medications. YOU HAD AN ENDOSCOPIC PROCEDURE TODAY AT THE [facility]:   Refer to the procedure report that was given to you for any specific questions about what was found during the examination.  If the procedure report does not answer your questions, please call your gastroenterologist to clarify.  If you requested that your care partner not be given the details of your procedure findings, then the procedure report has been included in a sealed envelope for you to review at your convenience later.  YOU SHOULD EXPECT: Some feelings of bloating in the abdomen. Passage of more gas than usual.  Walking can help get rid of the air that was put into your GI tract during the procedure and reduce the bloating. If you had a lower endoscopy (such as a colonoscopy or flexible sigmoidoscopy) you may notice spotting of blood in your stool or on the toilet paper. If you underwent a bowel prep for your procedure, you may not have a normal bowel movement for a few days.  Please Note:  You might notice some irritation and congestion in your nose or some drainage.  This is from the oxygen used during your procedure.  There is no need for concern and it should clear up in a day or so.  SYMPTOMS TO REPORT IMMEDIATELY:   Following lower endoscopy (colonoscopy or flexible sigmoidoscopy):  Excessive amounts of blood in the stool  Significant tenderness or worsening of abdominal pains  Swelling of the abdomen that is new, acute  Fever of 100F or higher   For urgent or emergent issues, a gastroenterologist can be reached at any hour by calling [phone].   DIET: Your first meal following the procedure should be a small meal and then it is ok to progress to your normal diet. Heavy or fried foods are harder to digest and may make you feel nauseous or bloated.  Likewise, meals heavy in dairy and vegetables can increase bloating.  Drink  plenty of fluids but you should avoid alcoholic beverages for 24 hours.  ACTIVITY:  You should plan to take it easy for the rest of today and you should NOT DRIVE or use heavy machinery until tomorrow (because of the sedation medicines used during the test).    FOLLOW UP: Our staff will call the number listed on your records the next business day following your procedure to check on you and address any questions or concerns that you may have regarding the information given to you following your procedure. If we do not reach you, we will leave a message.  However, if you are feeling well and you are not experiencing any problems, there is no need to return our call.  We will assume that you have returned to your regular daily activities without incident.  If any biopsies were taken you will be contacted by phone or by letter within the next 1-3 weeks.  Please call us at [phone] if you have not heard about the biopsies in 3 weeks.    SIGNATURES/CONFIDENTIALITY: You and/or your care partner have signed paperwork which will be entered into your electronic medical record.  These signatures attest to the fact that that the information above on your After Visit Summary has been reviewed and is understood.  Full responsibility of the confidentiality of this discharge information lies with you and/or your care-partner. 

## 2015-02-09 ENCOUNTER — Telehealth: Payer: Self-pay | Admitting: *Deleted

## 2015-02-09 NOTE — Telephone Encounter (Signed)
  Follow up Call-  Call back number 02/08/2015  Post procedure Call Back phone  # [phone]  Permission to leave phone message Yes     Patient questions:  Do you have a fever, pain , or abdominal swelling? No. Pain Score  0 *  Have you tolerated food without any problems? Yes.    Have you been able to return to your normal activities? Yes.    Do you have any questions about your discharge instructions: Diet   No. Medications  No. Follow up visit  No.  Do you have questions or concerns about your Care? No.  Actions: * If pain score is 4 or above: No action needed, pain <4.

## 2015-02-11 ENCOUNTER — Encounter: Payer: Self-pay | Admitting: Neurology

## 2015-02-11 ENCOUNTER — Ambulatory Visit (INDEPENDENT_AMBULATORY_CARE_PROVIDER_SITE_OTHER): Payer: BLUE CROSS/BLUE SHIELD | Admitting: Neurology

## 2015-02-11 VITALS — BP 138/100 | HR 80 | Resp 20 | Ht 66.0 in | Wt 202.1 lb

## 2015-02-11 DIAGNOSIS — G43009 Migraine without aura, not intractable, without status migrainosus: Secondary | ICD-10-CM | POA: Diagnosis not present

## 2015-02-11 DIAGNOSIS — G44219 Episodic tension-type headache, not intractable: Secondary | ICD-10-CM | POA: Diagnosis not present

## 2015-02-11 NOTE — Progress Notes (Signed)
NEUROLOGY FOLLOW UP OFFICE NOTE  Courney Garrod [id number]  HISTORY OF PRESENT ILLNESS: Desiree Dunn is a 44 year old right-handed woman with history of GERD who follows up for migraine without aura.  Records and EKG reviewed.    UPDATE: ECG from 06/02/14 showed NSR 81 bpm, PR 206 msec, QT 386 msec, QTc 422 msec.  She was subsequently started on verapamil.  However, she stopped after 3 months because she kept forgetting to take the dose correctly.  Nonetheless, the headaches have improved.  Her last migraine was about 2 months ago and responded to Advil.  She occasionally gets posterior head and neck tension type headaches about once a month that also respond to Advil.  She is doing well.  HISTORY: Onset:  Since teenager, but worse last year.   Location:  bilateral retro-orbital and bi-temporal Quality:  Pounding, pressure Initial intensity:  Initially 7/10; July 5/10 Aura:  no Associated symptoms:  Photophobia, phonophobia, sometimes nausea Initial duration:  Several hours; July 2 hours Initial frequency:  Severe attacks initially 2-3x/month; July 3 headaches per month (no severe headache) Triggers/exacerbating factors:  Stress, dairy Relieving factors:  Rest, fresh air, cold compress  Past abortive therapy:  imitrex (ineffective), ibuprofen (worked but told to stop) Past preventative therapy:  none  Family history of headache:  no  PAST MEDICAL HISTORY: Past Medical History  Diagnosis Date  . Miscarriage     x 1  . Ectopic pregnancy 1995     x 1 , R side   . Chronic RLQ pain     since 1995 when she had ectopic preg  . Pelvic congestion     h/o   . Diverticulitis   . GERD (gastroesophageal reflux disease)   . Migraine without aura and without status migrainosus, not intractable   . Obesity, unspecified 07/06/2010  . Allergy   . Sleep apnea     uses cpap    MEDICATIONS: Current Outpatient Prescriptions on File Prior to Visit  Medication Sig Dispense Refill  .  acetaminophen (TYLENOL) 500 MG tablet Take 500 mg by mouth every 6 (six) hours as needed. For pain    . Evening Primrose Oil 500 MG CAPS Take by mouth 2 (two) times daily.    . fexofenadine (ALLEGRA) 180 MG tablet Take 180 mg by mouth daily.    . meloxicam (MOBIC) 15 MG tablet Take 1 tablet (15 mg total) by mouth daily as needed for pain. 30 tablet 0  . Multiple Vitamin (MULTIVITAMIN WITH MINERALS) TABS Take 1 tablet by mouth daily.    [name] omeprazole (PRILOSEC) 20 MG capsule Take 1 capsule (20 mg total) by mouth daily. 30 capsule 6   No current facility-administered medications on file prior to visit.    ALLERGIES: Allergies  Allergen Reactions  . Amoxicillin     Hives    FAMILY HISTORY: Family History  Problem Relation Age of Onset  . Stroke Father   . Diabetes Father   . Hypertension Father   . Heart attack Neg Hx   . Hypertension Sister   . Colon cancer Mother 53  . Breast cancer Other     GM  . Colon polyps Brother   . Colitis Brother   . Colon cancer Maternal Aunt 21    SOCIAL HISTORY: History   Social History  . Marital Status: Married    Spouse Name: N/A  . Number of Children: 2  . Years of Education: N/A   Occupational History  . [employer],teacher,  PT   . works FT at [employer] Topics  . Smoking status: Never Smoker   . Smokeless tobacco: Never Used  . Alcohol Use: 0.0 oz/week    0 Standard drinks or equivalent per week     Comment: socially - 1 drink every 3 months  . Drug Use: No  . Sexual Activity:    Partners: Male   Other Topics Concern  . Not on file   Social History Narrative   Household-- pt , husband and children   daughter 39   son 59    REVIEW OF SYSTEMS: Constitutional: No fevers, chills, or sweats, no generalized fatigue, change in appetite Eyes: No visual changes, double vision, eye pain Ear, nose and throat: No hearing loss, ear pain, nasal congestion, sore throat Cardiovascular: No chest  pain, palpitations Respiratory:  No shortness of breath at rest or with exertion, wheezes GastrointestinaI: No nausea, vomiting, diarrhea, abdominal pain, fecal incontinence Genitourinary:  No dysuria, urinary retention or frequency Musculoskeletal:  No neck pain, back pain Integumentary: No rash, pruritus, skin lesions Neurological: as above Psychiatric: No depression, insomnia, anxiety Endocrine: No palpitations, fatigue, diaphoresis, mood swings, change in appetite, change in weight, increased thirst Hematologic/Lymphatic:  No anemia, purpura, petechiae. Allergic/Immunologic: no itchy/runny eyes, nasal congestion, recent allergic reactions, rashes  PHYSICAL EXAM: Filed Vitals:   02/11/15 1333  BP: 138/100  Pulse: 80  Resp: 20   General: No acute distress Head:  Normocephalic/atraumatic Eyes:  Fundoscopic exam unremarkable without vessel changes, exudates, hemorrhages or papilledema. Neck: supple, no paraspinal tenderness, full range of motion Heart:  Regular rate and rhythm Lungs:  Clear to auscultation bilaterally Back: No paraspinal tenderness Neurological Exam: alert and oriented to person, place, and time. Attention span and concentration intact, recent and remote memory intact, fund of knowledge intact.  Speech fluent and not dysarthric, language intact.  CN II-XII intact. Fundoscopic exam unremarkable without vessel changes, exudates, hemorrhages or papilledema.  Bulk and tone normal, muscle strength 5/5 throughout.  Sensation to light touch intact.  Deep tendon reflexes 2+ throughout, toes downgoing.  Finger to nose and heel to shin testing intact.  Gait normal.  IMPRESSION: Migraine without aura Tension-type headache  PLAN: She is doing well.  She will take Advil as needed.  She will follow up in one year or as needed.  15 minutes spent with patient, over 50% spent discussing management.  Metta Clines, DO  CC:  Kathlene November, MD

## 2015-02-14 ENCOUNTER — Encounter: Payer: Self-pay | Admitting: Gastroenterology

## 2015-05-24 ENCOUNTER — Encounter: Payer: Self-pay | Admitting: Internal Medicine

## 2015-05-24 ENCOUNTER — Ambulatory Visit (INDEPENDENT_AMBULATORY_CARE_PROVIDER_SITE_OTHER): Payer: BLUE CROSS/BLUE SHIELD | Admitting: Internal Medicine

## 2015-05-24 VITALS — BP 142/86 | HR 71 | Temp 98.1°F | Ht 66.0 in | Wt 220.1 lb

## 2015-05-24 DIAGNOSIS — E669 Obesity, unspecified: Secondary | ICD-10-CM | POA: Diagnosis not present

## 2015-05-24 DIAGNOSIS — I10 Essential (primary) hypertension: Secondary | ICD-10-CM | POA: Insufficient documentation

## 2015-05-24 DIAGNOSIS — G43009 Migraine without aura, not intractable, without status migrainosus: Secondary | ICD-10-CM | POA: Diagnosis not present

## 2015-05-24 HISTORY — DX: Essential (primary) hypertension: I10

## 2015-05-24 LAB — BASIC METABOLIC PANEL
BUN: 7 mg/dL (ref 6–23)
CALCIUM: 9.2 mg/dL (ref 8.4–10.5)
CHLORIDE: 106 meq/L (ref 96–112)
CO2: 26 mEq/L (ref 19–32)
Creatinine, Ser: 0.8 mg/dL (ref 0.40–1.20)
GFR: 99.55 mL/min (ref >60.00)
Glucose, Bld: 86 mg/dL (ref 70–99)
POTASSIUM: 3.8 meq/L (ref 3.5–5.1)
Sodium: 138 mEq/L (ref 135–145)

## 2015-05-24 MED ORDER — VERAPAMIL HCL ER 120 MG PO TBCR: 120.0000 mg | EXTENDED_RELEASE_TABLET | Freq: Every day | ORAL | Status: DC

## 2015-05-24 NOTE — Progress Notes (Signed)
Subjective:    Patient ID: Desiree Dunn, female    DOB: 1971-03-09, 44 y.o.   MRN: [id number]  DOS:  05/24/2015 Type of visit - description : Acute Interval history: Approximately 3 weeks ago started to get mild dizziness on and off, triggered by standing up but sometimes sitting, lasting a minute or 2,it was a feeling of unsteadiness, not spinning. Because of that she started to check her BPs: 168/88 today, previously 144/95 and 153/82. Quite concerned about her blood pressure. Obesity, request a referral to a nutritionist   Review of Systems Denies chest pain, difficulty breathing or lower extremity edema History of headaches, they are more frequent lately, but not different from previous headaches No changes in her sodium intake, taking NSAIDs almost daily due to headaches No slurred speech, motor deficits or double vision.  Past Medical History  Diagnosis Date  . Miscarriage     x 1  . Ectopic pregnancy 1995     x 1 , R side   . Chronic RLQ pain     since 1995 when she had ectopic preg  . Pelvic congestion     h/o   . Diverticulitis   . GERD (gastroesophageal reflux disease)   . Migraine without aura and without status migrainosus, not intractable   . Obesity, unspecified 07/06/2010  . Allergy   . Sleep apnea     uses cpap  . HTN (hypertension) 05/24/2015    Past Surgical History  Procedure Laterality Date  . Tubal ligation  2000  . Breast reduction surgery  12/09  . Abdominal hysterectomy  12/2009    [facility]  . Right oophorectomy  12/2009    [place]  . Colonoscopy  2001    X3 ; diverticulosis    History   Social History  . Marital Status: Married    Spouse Name: N/A  . Number of Children: 2  . Years of Education: N/A   Occupational History  . [employer],teacher, PT   . works FT at [employer] Topics  . Smoking status: Never Smoker   . Smokeless tobacco: Never Used  . Alcohol Use: 0.0 oz/week    0 Standard drinks  or equivalent per week     Comment: socially - 1 drink every 3 months  . Drug Use: No  . Sexual Activity:    Partners: Male   Other Topics Concern  . Not on file   Social History Narrative   Household-- pt , husband and children   daughter 41   son 44        Medication List       This list is accurate as of: 05/24/15  5:08 PM.  Always use your most recent med list.               acetaminophen 500 MG tablet  Commonly known as:  TYLENOL  Take 500 mg by mouth every 6 (six) hours as needed. For pain     Evening Primrose Oil 500 MG Caps  Take by mouth 2 (two) times daily.     fexofenadine 180 MG tablet  Commonly known as:  ALLEGRA  Take 180 mg by mouth daily.     meloxicam 15 MG tablet  Commonly known as:  MOBIC  Take 1 tablet (15 mg total) by mouth daily as needed for pain.     multivitamin with minerals Tabs tablet  Take 1 tablet by mouth daily.  omeprazole 20 MG capsule  Commonly known as:  PRILOSEC  Take 1 capsule (20 mg total) by mouth daily.     verapamil 120 MG CR tablet  Commonly known as:  CALAN-SR  Take 1 tablet (120 mg total) by mouth at bedtime.           Objective:   Physical Exam BP 142/86 mmHg  Pulse 71  Temp(Src) 98.1 F (36.7 C) (Oral)  Ht 5\' 6"  (1.676 m)  Wt 220 lb 2 oz (99.848 kg)  BMI 35.55 kg/m2  SpO2 97% General:   Well developed, well nourished . NAD.  HEENT:  Normocephalic . Face symmetric, atraumatic Neck: No TTP at the cervical spine and full range of motion  Lungs:  CTA B Normal respiratory effort, no intercostal retractions, no accessory muscle use. Heart: RRR,  no murmur.  No pretibial edema bilaterally  Skin: Not pale. Not jaundice Neurologic:  alert & oriented X3.  Speech normal, gait appropriate for age and unassisted DTRs symmetric. EOMI, pupils equal and reactive Psych--  Cognition and judgment appear intact.  Cooperative with normal attention span and concentration.  Behavior appropriate. No anxious  or depressed appearing.      Assessment & Plan:

## 2015-05-24 NOTE — Assessment & Plan Note (Signed)
Headaches frequently lately but not different from previous episodes. She is starting verapamil today for blood pressure and actually that may also help her headaches.

## 2015-05-24 NOTE — Progress Notes (Signed)
Pre visit review using our clinic review tool, if applicable. No additional management support is needed unless otherwise documented below in the visit note. 

## 2015-05-24 NOTE — Assessment & Plan Note (Addendum)
Mild elevation of her blood pressure in the last few weeks, likely has mild essential hypertension. Encourage low salt diet and avoid NSAIDs which may raise her BPs Check a BMP start verapamil  which she used to take for chronic headaches. She has mild dizziness and a normal neurological exam, will treat the BP first, if dizziness persists she will let me know

## 2015-05-24 NOTE — Patient Instructions (Addendum)
Get your blood work before you leave   Start verapamil once daily  Check the  blood pressure 2 or 3 times a   Week   Be sure your blood pressure is between 110/65 and  145/85.  if it is consistently higher or lower, let me know   Call if the headache and dizziness are not gradually improving in the next 10 days, call anytime if they become severe

## 2015-05-24 NOTE — Assessment & Plan Note (Signed)
Request a nutritionist referral, unable to loss weight the way she would like to despite her efforts

## 2015-05-25 NOTE — Addendum Note (Signed)
Addended by: Kathlene November E on: 05/25/2015 09:26 AM   Modules accepted: Miquel Dunn

## 2015-06-10 ENCOUNTER — Ambulatory Visit: Payer: BLUE CROSS/BLUE SHIELD | Admitting: Dietician

## 2015-06-29 ENCOUNTER — Encounter: Payer: Self-pay | Admitting: Gastroenterology

## 2015-07-07 ENCOUNTER — Encounter: Payer: BLUE CROSS/BLUE SHIELD | Attending: Internal Medicine | Admitting: Dietician

## 2015-07-07 ENCOUNTER — Encounter: Payer: Self-pay | Admitting: Dietician

## 2015-07-07 VITALS — Ht 64.0 in | Wt 223.2 lb

## 2015-07-07 DIAGNOSIS — Z713 Dietary counseling and surveillance: Secondary | ICD-10-CM | POA: Insufficient documentation

## 2015-07-07 DIAGNOSIS — E669 Obesity, unspecified: Secondary | ICD-10-CM | POA: Insufficient documentation

## 2015-07-07 DIAGNOSIS — Z6838 Body mass index (BMI) 38.0-38.9, adult: Secondary | ICD-10-CM | POA: Insufficient documentation

## 2015-07-07 NOTE — Progress Notes (Signed)
  Medical Nutrition Therapy:  Appt start time: 350 end time:  450   Assessment:  Primary concerns today: Desiree Dunn is here today to discuss her weight. She states that her pre-hysterectomy weight was 160 lbs. She is the youngest child of 7 children. Her mother died of colon cancer in her 57s and her father had diabetes. She lives with her husband of 4 years and 45 year old son. She has a daughter in college. In the past when she has walked for 30 minutes several days in a row, she has noticed hip pain, leaving her unable to sustain walking regimen. 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 Struggles with fatigue and low energy level. She does most of the cooking at home.  Preferred Learning Style:   No preference indicated   Learning Readiness:   Ready   MEDICATIONS: see list   DIETARY INTAKE:  24-hr recall:  Wakes up around 6:30 B ( AM): usually skips, sometimes ginger tea or ginger ale Snk (11:30 AM): yogurt or candy bar or bag of chips or peanut butter crackers L (1:30-2 PM): sandwich, fruit or applesauce OR burger with onion rings OR salad Snk ( PM):  D ( PM): chicken OR meatloaf Snk ( PM): not usually  Beverages: water, coffee with cream and Splenda, sometimes sweet tea, 16 oz ginger ale per day  Home from work around 6pm  Usual physical activity: ADLs  Estimated energy needs: 1400-1600 calories  Progress Towards Goal(s):  In progress.   Nutritional Diagnosis:  [place]-3.3 Overweight/obesity As related to meal skipping, physical inactivity, inappropriate food choices, and excessive energy intake.  As evidenced by BMI 38.4.    Intervention:  Nutrition counseling provided.  Goals: -Keep working on ways to decrease stress -Avoid keeping sweets (and other trigger foods) in the house  -Make sweets and other treats a special occasion (enjoy it and don't feel guilty) -Include a carbohydrate and protein with each meal and snack -Continue to keep  healthy snacks on hand -Work on having something for breakfast (try having a protein shake like Atkins or EAS AdvantEdge + fruit) -Keep using Splenda in coffee and use sweetener for unsweet tea -Exercise as tolerated  -Set small goals! -Healthy weight loss is 1-2 pounds per week   Teaching Method Utilized:  Visual Auditory Hands on  Handouts given during visit include:  Snack list  MyPlate  Meal planning card  Barriers to learning/adherence to lifestyle change: none  Demonstrated degree of understanding via:  Teach Back   Monitoring/Evaluation:  Dietary intake, exercise, and body weight in 6 week(s).

## 2015-07-07 NOTE — Patient Instructions (Addendum)
-  Keep working on ways to decrease stress  -Avoid keeping sweets (and other trigger foods) in the house  -Make sweets and other treats a special occasion (enjoy it and don't feel guilty)  -Include a carbohydrate and protein with each meal and snack -Continue to keep healthy snacks on hand  -Work on having something for breakfast (try having a protein shake like Atkins or EAS AdvantEdge + fruit)  -Keep using Splenda in coffee and use sweetener for unsweet tea  -Exercise as tolerated  -Set small goals!  -Healthy weight loss is 1-2 pounds per week

## 2015-07-12 ENCOUNTER — Other Ambulatory Visit: Payer: Self-pay

## 2015-07-12 MED ORDER — VERAPAMIL HCL ER 120 MG PO TBCR: 120.0000 mg | EXTENDED_RELEASE_TABLET | Freq: Every day | ORAL | Status: DC

## 2015-08-24 ENCOUNTER — Encounter: Payer: Self-pay | Admitting: Internal Medicine

## 2015-08-24 ENCOUNTER — Ambulatory Visit (INDEPENDENT_AMBULATORY_CARE_PROVIDER_SITE_OTHER): Payer: BLUE CROSS/BLUE SHIELD | Admitting: Internal Medicine

## 2015-08-24 VITALS — BP 122/64 | HR 73 | Temp 98.4°F | Ht 64.0 in | Wt 222.0 lb

## 2015-08-24 DIAGNOSIS — I10 Essential (primary) hypertension: Secondary | ICD-10-CM | POA: Diagnosis not present

## 2015-08-24 DIAGNOSIS — Z23 Encounter for immunization: Secondary | ICD-10-CM

## 2015-08-24 DIAGNOSIS — E669 Obesity, unspecified: Secondary | ICD-10-CM

## 2015-08-24 DIAGNOSIS — F411 Generalized anxiety disorder: Secondary | ICD-10-CM

## 2015-08-24 DIAGNOSIS — Z09 Encounter for follow-up examination after completed treatment for conditions other than malignant neoplasm: Secondary | ICD-10-CM

## 2015-08-24 NOTE — Patient Instructions (Signed)
     Next visit  for a physical exam by December 2016    Please schedule an appointment at the front desk

## 2015-08-24 NOTE — Progress Notes (Signed)
Pre visit review using our clinic review tool, if applicable. No additional management support is needed unless otherwise documented below in the visit note. 

## 2015-08-24 NOTE — Progress Notes (Signed)
Subjective:    Patient ID: Desiree Dunn, female    DOB: 1971/10/16, 44 y.o.   MRN: [id number]  DOS:  08/24/2015 Type of visit - description : rov Interval history: Started BP meds, BPs were elevated for a while but in the last 2 weeks readings are better. Headaches: Improved Anxiety: Has a stressful job, decided to reach out to [facility], they are helping with stress management and is working well for her. Obesity: Saw a nutritionist, determined to work on her diet habits.   Review of Systems Denies chest pain or difficulty breathing No nausea, vomiting, diarrhea. No depression per se  Past Medical History  Diagnosis Date  . Miscarriage     x 1  . Ectopic pregnancy 1995     x 1 , R side   . Chronic RLQ pain     since 1995 when she had ectopic preg  . Pelvic congestion     h/o   . Diverticulitis   . GERD (gastroesophageal reflux disease)   . Migraine without aura and without status migrainosus, not intractable   . Obesity, unspecified 07/06/2010  . Allergy   . Sleep apnea     uses cpap  . HTN (hypertension) 05/24/2015    Past Surgical History  Procedure Laterality Date  . Tubal ligation  2000  . Breast reduction surgery  12/09  . Abdominal hysterectomy  12/2009    [facility]  . Right oophorectomy  12/2009    [facility]  . Colonoscopy  2001    X3 ; diverticulosis    Social History   Social History  . Marital Status: Married    Spouse Name: N/A  . Number of Children: 2  . Years of Education: N/A   Occupational History  . 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 Smoking status: Never Smoker   . Smokeless tobacco: Never Used  . Alcohol Use: 0.0 oz/week    0 Standard drinks or equivalent per week     Comment: socially - 1 drink every 3 months  . Drug Use: No  . Sexual Activity:    Partners: Male   Other Topics Concern  . Not on file   Social History Narrative   Household-- pt , husband  and children   daughter 70   son 67        Medication List       This list is accurate as of: 08/24/15 11:59 PM.  Always use your most recent med list.               Evening Primrose Oil 500 MG Caps  Take by mouth 2 (two) times daily.     fexofenadine 180 MG tablet  Commonly known as:  ALLEGRA  Take 180 mg by mouth daily.     multivitamin with minerals Tabs tablet  Take 1 tablet by mouth daily.     omeprazole 20 MG capsule  Commonly known as:  PRILOSEC  Take 1 capsule (20 mg total) by mouth daily.     verapamil 120 MG CR tablet  Commonly known as:  CALAN-SR  Take 1 tablet (120 mg total) by mouth at bedtime.           Objective:   Physical Exam BP 122/64 mmHg  Pulse 73  Temp(Src) 98.4 F (36.9 C) (Oral)  Ht 5\' 4"  (1.626 m)  Wt 222 lb (100.699 kg)  BMI  38.09 kg/m2  SpO2 97% General:   Well developed, well nourished . NAD.  HEENT:  Normocephalic . Face symmetric, atraumatic Lungs:  CTA B Normal respiratory effort, no intercostal retractions, no accessory muscle use. Heart: RRR,  no murmur.  No pretibial edema bilaterally  Skin: Not pale. Not jaundice Neurologic:  alert & oriented X3.  Speech normal, gait appropriate for age and unassisted Psych--  Cognition and judgment appear intact.  Cooperative with normal attention span and concentration.  Behavior appropriate. No anxious or depressed appearing.      Assessment & Plan:   Assessment > HTN -- started verapamil 05-2015 OSA on CPAP Chronic RLQ abdominal pain since 1995 ectopic pregnancy H/o pelvic congestion GI: GERD, diverticulitis Migraines Obesity  Anxiety   Plan HTN: Started verapamil 05-2015, BP finally improved few weeks ago probably due to better anxiety management. See below Anxiety: Has a stressful job, new she had anxiety for a while, eventually went to [facility] and is trying to manage her stress better. So far is working well. Obesity: Recently saw a nutritionist,  working on her lifestyle. RTC December 2016 for a physical Primary care-- flu shot

## 2015-08-25 ENCOUNTER — Ambulatory Visit: Payer: BLUE CROSS/BLUE SHIELD | Admitting: Dietician

## 2015-08-25 DIAGNOSIS — Z09 Encounter for follow-up examination after completed treatment for conditions other than malignant neoplasm: Secondary | ICD-10-CM | POA: Insufficient documentation

## 2015-08-25 NOTE — Assessment & Plan Note (Signed)
HTN: Started verapamil 05-2015, BP finally improved few weeks ago probably due to better anxiety management. See below Anxiety: Has a stressful job, new she had anxiety for a while, eventually went to [facility] and is trying to manage her stress better. So far is working well. Obesity: Recently saw a nutritionist, working on her lifestyle. RTC December 2016 for a physical Primary care-- flu shot

## 2015-09-27 ENCOUNTER — Encounter: Payer: BLUE CROSS/BLUE SHIELD | Attending: Internal Medicine | Admitting: Dietician

## 2015-09-27 ENCOUNTER — Encounter: Payer: Self-pay | Admitting: Dietician

## 2015-09-27 VITALS — Ht 64.0 in | Wt 222.9 lb

## 2015-09-27 DIAGNOSIS — Z6838 Body mass index (BMI) 38.0-38.9, adult: Secondary | ICD-10-CM | POA: Insufficient documentation

## 2015-09-27 DIAGNOSIS — E669 Obesity, unspecified: Secondary | ICD-10-CM | POA: Insufficient documentation

## 2015-09-27 DIAGNOSIS — Z713 Dietary counseling and surveillance: Secondary | ICD-10-CM | POA: Insufficient documentation

## 2015-09-27 NOTE — Progress Notes (Signed)
  Medical Nutrition Therapy:  Appt start time: [id number] end time:  1125   Assessment:  Primary concerns today: Desiree Dunn returns today for a follow up for obesity. Previously saw Desiree Dunn, RD in August. Has lost 1 lb since August. Has a stressful job with the school system and realizing that she is stress eating. Eats things like ice cream after school hours. Feeling tired overall d/t work and the types of foods she is eating. Skips breakfast most mornings. Also might skip lunch some days. Eats out about 3 meals per week.   Since she was here last time she has been trying to eat more protein (tuna packets with crackers). [facility]ed going to [facility] and getting physical therapy for the past month. Has pain with walking on her right side so she could not walk regularly. Has sleep apnea though does not use her CPAP. Talking with her dentist about another device to help with sleep apnea. Has trouble falling sleeping about 4 x week.  Lives with 51 year old son and husband and tries to cook 4 x week. Tries to not buy trigger foods though will get some sweets if she is out.   Goals: learn how to eat better, exercise more, decrease blood pressure, lose weight (no specific goal)  Preferred Learning Style:   No preference indicated   Learning Readiness:   Ready   MEDICATIONS: see list   DIETARY INTAKE:  24-hr recall:  Wakes up around 6:30 B ( AM): usually skips, or toast with egg and [place] sausage, sometimes ginger ale Snk (11:30 AM): peanut butter crackers sometimes with applesauce or pineapple L (1:30-2 PM): skips or salad or soup, chicken sandwich with fries Snk ( PM): might have chips or fruit snacks  D ( PM): chicken or fish with a starch and green beans or eats out on Friday on Saturday - chicken and waffles or subs ([employer] - chicken philly and eats over 2 days) Snk ( PM): not usually  Beverages: water, coffee with cream and Splenda, sometimes sweet tea, 16 oz ginger ale  per day  Home from work around 6pm  Usual physical activity: ADLs and physical therapy  Estimated energy needs: 1400-1600 calories  Progress Towards Goal(s):  In progress.   Nutritional Diagnosis:  [place]-3.3 Overweight/obesity As related to meal skipping, physical inactivity, inappropriate food choices, and excessive energy intake.  As evidenced by BMI 38.4.    Intervention:  Nutrition counseling provided.  Goals: -Keep working on ways to decrease stress, consider talking to a therapist about stress management  -Consider follow up with doctor who prescribed CPAP about options to make that more comfortable -Aim to eat something with protein and carbs about every 3-5 hours you are awake -Take breaks at work to eat (think about putting on your door) -Continue keeping sweets (and other trigger foods) out of the house  -Make sweets and other treats a special occasion (about 1 x week) -Try having a protein shake like Premier or Atkins or EAS AdvantEdge + fruit) -Increase exercise as tolerated  Teaching Method Utilized:  Visual Auditory Hands on  Handouts given during visit include:  Snack list  Therapists in the Area  Barriers to learning/adherence to lifestyle change: none  Demonstrated degree of understanding via:  Teach Back   Monitoring/Evaluation:  Dietary intake, exercise, and body weight in 4 week(s).

## 2015-09-27 NOTE — Patient Instructions (Addendum)
-  Keep working on ways to decrease stress, consider talking to a therapist about stress management  -Consider follow up with doctor who prescribed CPAP about options to make that more comfortable -Aim to eat something with protein and carbs about every 3-5 hours you are awake -Take breaks at work to eat (think about putting on your door) -Continue keeping sweets (and other trigger foods) out of the house  -Make sweets and other treats a special occasion (about 1 x week) -Try having a protein shake like Premier or Atkins or EAS AdvantEdge + fruit) -Increase exercise as tolerated

## 2015-10-21 ENCOUNTER — Encounter: Payer: Self-pay | Admitting: Internal Medicine

## 2015-10-21 ENCOUNTER — Encounter (INDEPENDENT_AMBULATORY_CARE_PROVIDER_SITE_OTHER): Payer: Self-pay

## 2015-10-21 ENCOUNTER — Ambulatory Visit (INDEPENDENT_AMBULATORY_CARE_PROVIDER_SITE_OTHER): Payer: BLUE CROSS/BLUE SHIELD | Admitting: Internal Medicine

## 2015-10-21 VITALS — BP 126/82 | HR 75 | Temp 98.2°F | Ht 64.0 in | Wt 222.0 lb

## 2015-10-21 DIAGNOSIS — J40 Bronchitis, not specified as acute or chronic: Secondary | ICD-10-CM

## 2015-10-21 DIAGNOSIS — Z09 Encounter for follow-up examination after completed treatment for conditions other than malignant neoplasm: Secondary | ICD-10-CM

## 2015-10-21 MED ORDER — AZITHROMYCIN 250 MG PO TABS: ORAL_TABLET | ORAL | Status: DC

## 2015-10-21 MED ORDER — HYDROCODONE-HOMATROPINE 5-1.5 MG/5ML PO SYRP: 5.0000 mL | ORAL_SOLUTION | Freq: Every evening | ORAL | Status: DC | PRN

## 2015-10-21 MED ORDER — PREDNISONE 10 MG PO TABS: 10.0000 mg | ORAL_TABLET | Freq: Every day | ORAL | Status: DC

## 2015-10-21 NOTE — Progress Notes (Signed)
Subjective:    Patient ID: Desiree Dunn, female    DOB: February 11, 1971, 44 y.o.   MRN: [id number]  DOS:  10/21/2015 Type of visit - description : Acute visit Interval history: Symptoms started approximately 5 days ago with cough, on and off, very intense at times, having a hard time sleeping. She also heard some wheezing, has albuterol at home and has used that as needed.   Review of Systems  Had fever one time 2 days ago. No chills. + Sinus congestion, + clear nasal discharge. No nausea or vomiting. No myalgias. Mild chest congestion in addition to wheezing.  Past Medical History  Diagnosis Date  . Miscarriage     x 1  . Ectopic pregnancy 1995     x 1 , R side   . Chronic RLQ pain     since 1995 when she had ectopic preg  . Pelvic congestion     h/o   . Diverticulitis   . GERD (gastroesophageal reflux disease)   . Migraine without aura and without status migrainosus, not intractable   . Obesity, unspecified 07/06/2010  . Allergy   . Sleep apnea     uses cpap  . HTN (hypertension) 05/24/2015    Past Surgical History  Procedure Laterality Date  . Tubal ligation  2000  . Breast reduction surgery  12/09  . Abdominal hysterectomy  12/2009    [facility]  . Right ooph[place]omy  12/2009    [facility]  . Colonoscopy  2001    X3 ; diverticulosis    Social History   Social History  . Marital Status: Married    Spouse Name: N/A  . Number of Children: 2  . Years of Education: N/A   Occupational History  . [employer],teacher, PT   . works FT at [employer] Topics  . Smoking status: Never Smoker   . Smokeless tobacco: Never Used  . Alcohol Use: 0.0 oz/week    0 Standard drinks or equivalent per week     Comment: socially - 1 drink every 3 months  . Drug Use: No  . Sexual Activity:    Partners: Male   Other Topics Concern  . Not on file   Social History Narrative   Household-- pt , husband and children   daughter 48   son 79         Medication List       This list is accurate as of: 10/21/15 11:59 PM.  Always use your most recent med list.               azithromycin 250 MG tablet  Commonly known as:  ZITHROMAX Z-PAK  2 tabs a day the first day, then 1 tab a day x 4 days     Evening Primrose Oil 500 MG Caps  Take by mouth 2 (two) times daily.     fexofenadine 180 MG tablet  Commonly known as:  ALLEGRA  Take 180 mg by mouth daily.     HYDROcodone-homatropine 5-1.5 MG/5ML syrup  Commonly known as:  HYCODAN  Take 5 mLs by mouth at bedtime as needed for cough.     multivitamin with minerals Tabs tablet  Take 1 tablet by mouth daily.     omeprazole 20 MG capsule  Commonly known as:  PRILOSEC  Take 1 capsule (20 mg total) by mouth daily.     predniSONE 10 MG tablet  Commonly known as:  DELTASONE  Take 1 tablet (10 mg total) by mouth daily. 2 tabs a day x 5 days     verapamil 120 MG CR tablet  Commonly known as:  CALAN-SR  Take 1 tablet (120 mg total) by mouth at bedtime.           Objective:   Physical Exam BP 126/82 mmHg  Pulse 75  Temp(Src) 98.2 F (36.8 C) (Oral)  Ht 5\' 4"  (1.626 m)  Wt 222 lb (100.699 kg)  BMI 38.09 kg/m2  SpO2 95% General:   Well developed, well nourished . NAD.  HEENT:  Normocephalic . Face symmetric, atraumatic. TMs: Slightly bulge, no red. Nose congested. Sinuses no TTP. Throat is not red. Lungs:  CTA B Normal respiratory effort, no intercostal retractions, no accessory muscle use. Heart: RRR,  no murmur.  No pretibial edema bilaterally  Skin: Not pale. Not jaundice Neurologic:  alert & oriented X3.  Speech normal, gait appropriate for age and unassisted Psych--  Cognition and judgment appear intact.  Cooperative with normal attention span and concentration.  Behavior appropriate. No anxious or depressed appearing.      Assessment & Plan:   Assessment > HTN -- started verapamil 05-2015 OSA on CPAP Reactive airway dz anxiety Chronic RLQ  abdominal pain since 1995 ectopic pregnancy H/o pelvic congestion GI: GERD, diverticulitis Migraines Obesity  BTL  PLAN: Bronchitis: Bronchitis and bronchospasm (by history). Recommend conservative treatment, to take abx only if not improving in few days. See instructions

## 2015-10-21 NOTE — Patient Instructions (Signed)
Rest, fluids , tylenol  For cough: Take Mucinex DM twice a day as needed until better  For nasal congestion Use OTC Nasocort or Flonase : 2 nasal sprays on each side of the nose daily until you feel better   Take prednisone for a few days  Use albuterol up to every 6 hours for cough, wheezing.  Take the antibiotic as prescribed  (Zithromax)  only if you are not improving in the next 3 or 4 days.  Call if not gradually better over the next  10 days  Call anytime if the symptoms are severe

## 2015-10-21 NOTE — Progress Notes (Signed)
Pre visit review using our clinic review tool, if applicable. No additional management support is needed unless otherwise documented below in the visit note. 

## 2015-10-23 NOTE — Assessment & Plan Note (Signed)
Bronchitis: Bronchitis and bronchospasm (by history). Recommend conservative treatment, to take abx only if not improving in few days. See instructions

## 2015-10-26 ENCOUNTER — Telehealth: Payer: Self-pay | Admitting: Internal Medicine

## 2015-10-26 NOTE — Telephone Encounter (Signed)
Caller name: Self   Can be reached:  [phone] ([phone]   Pharmacy:  [id number] - [place], [place] [phone] (Phone) [phone] (Fax)         Reason for call: Patient requesting a different type of syrup for her bronchitis. She states that the HYDROcodone-homatropine [facility]) 5-1.5 MG/5ML syrup [id number] is upsetting her stomach.

## 2015-10-26 NOTE — Telephone Encounter (Signed)
Please advise 

## 2015-10-27 MED ORDER — GUAIFENESIN-CODEINE 100-10 MG/5ML PO SOLN: 5.0000 mL | Freq: Two times a day (BID) | ORAL | Status: DC | PRN

## 2015-10-27 NOTE — Telephone Encounter (Signed)
LMOM informing Pt that Rx has been placed at front desk for pick up at her convenience to replace Hydrocodone syrup.

## 2015-10-27 NOTE — Telephone Encounter (Signed)
Will stop hydrocodone and try codeine. Hopefully will have the same side effects; see rx

## 2015-10-28 NOTE — Telephone Encounter (Signed)
Pt called stating she will be coming to pick up RX but isn't sure if she can get here by 5pm. Per Maudie Mercury we can take RX down to [facility] is pt is not here by 5pm today.

## 2015-11-02 ENCOUNTER — Ambulatory Visit: Payer: BLUE CROSS/BLUE SHIELD | Admitting: Dietician

## 2015-11-04 ENCOUNTER — Telehealth: Payer: Self-pay | Admitting: Internal Medicine

## 2015-11-04 ENCOUNTER — Ambulatory Visit: Payer: BLUE CROSS/BLUE SHIELD | Admitting: Internal Medicine

## 2015-11-08 ENCOUNTER — Other Ambulatory Visit: Payer: Self-pay | Admitting: Internal Medicine

## 2015-11-08 ENCOUNTER — Encounter: Payer: BLUE CROSS/BLUE SHIELD | Admitting: Internal Medicine

## 2015-11-16 ENCOUNTER — Ambulatory Visit (INDEPENDENT_AMBULATORY_CARE_PROVIDER_SITE_OTHER): Payer: BLUE CROSS/BLUE SHIELD

## 2015-11-16 ENCOUNTER — Ambulatory Visit (INDEPENDENT_AMBULATORY_CARE_PROVIDER_SITE_OTHER): Payer: BLUE CROSS/BLUE SHIELD | Admitting: Podiatry

## 2015-11-16 ENCOUNTER — Encounter: Payer: Self-pay | Admitting: Podiatry

## 2015-11-16 DIAGNOSIS — M7662 Achilles tendinitis, left leg: Secondary | ICD-10-CM

## 2015-11-16 DIAGNOSIS — M722 Plantar fascial fibromatosis: Secondary | ICD-10-CM | POA: Diagnosis not present

## 2015-11-16 DIAGNOSIS — M779 Enthesopathy, unspecified: Secondary | ICD-10-CM

## 2015-11-16 DIAGNOSIS — M79672 Pain in left foot: Secondary | ICD-10-CM | POA: Diagnosis not present

## 2015-11-16 DIAGNOSIS — M79671 Pain in right foot: Secondary | ICD-10-CM

## 2015-11-16 MED ORDER — TRIAMCINOLONE ACETONIDE 10 MG/ML IJ SUSP
10.0000 mg | Freq: Once | INTRAMUSCULAR | Status: AC
  Administered 2015-11-16: 10 mg

## 2015-11-16 MED ORDER — DICLOFENAC SODIUM 75 MG PO TBEC: 75.0000 mg | DELAYED_RELEASE_TABLET | Freq: Two times a day (BID) | ORAL | Status: DC

## 2015-11-16 NOTE — Patient Instructions (Signed)

## 2015-11-16 NOTE — Progress Notes (Signed)
Subjective:     Patient ID: Desiree Dunn, female   DOB: [birth year], 45 y.o.   MRN: [id number]  HPI patient presents stating I developed a lot of pain in the back of my left heel and also in the bottom of my heel. I do not remember specific injury and it's been hurting for about a month   Review of Systems  All other systems reviewed and are negative.      Objective:   Physical Exam  Constitutional: She is oriented to person, place, and time.  Cardiovascular: Intact distal pulses.   Musculoskeletal: Normal range of motion.  Neurological: She is oriented to person, place, and time.  Skin: Skin is warm.  Nursing note and vitals reviewed.  neurovascular status intact muscle strength adequate range of motion within normal limits with patient noted to have quite a bit of discomfort on the plantar aspect left heel moderate discomfort on the posterior aspect of the left heel and left calf with a negative Homans sign noted. Patient's found to have good digital perfusion and is well oriented 3     Assessment:     Plantar fasciitis Achilles tendinitis left with difficulty as to which came first but chronic overuse syndrome occurring. No indications of clot    Plan:     H&P and x-rays reviewed of both feet with patient. Today I injected the left plantar fascia 3 Milligan Kenalog 5 mill grams Xylocaine and then applied air fracture walker to completely immobilize and begin heat and cold therapy for the posterior heel. Reappoint to recheck in 3 weeks and utilized oral anti-inflammatories diclofenac 75 mg twice a day

## 2015-11-16 NOTE — Progress Notes (Signed)
   Subjective:    Patient ID: Desiree Dunn, female    DOB: Jun 24, 1971, 45 y.o.   MRN: [id number]  HPI  Pt presents with left foot tendon pain located posterior for 3 weeks, worsens throughout the day and withprolonged walking. Swelling present  Review of Systems  All other systems reviewed and are negative.      Objective:   Physical Exam        Assessment & Plan:

## 2015-11-21 NOTE — Telephone Encounter (Signed)
Pt was no show 11/04/15 2:45pm for acute appt, pt did not reschedule, charge or no charge?

## 2015-11-21 NOTE — Telephone Encounter (Signed)
Charge. 

## 2015-11-22 ENCOUNTER — Telehealth: Payer: Self-pay | Admitting: *Deleted

## 2015-11-22 NOTE — Telephone Encounter (Signed)
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  I told pt, to think about what would best work for her and I would write a note.  I reiterated the fact that the boot was protecting and holding the achilles in a protected and neutral position, so as not to shorten then abruptly stretch and possibly tear.  Pt states understanding and will call again with suggestions.

## 2015-11-25 ENCOUNTER — Telehealth: Payer: Self-pay | Admitting: Internal Medicine

## 2015-11-25 NOTE — Telephone Encounter (Signed)
Called pt to schedule her CPE sooner than (April) scheduled, no answer. LVM for pt.

## 2015-12-06 ENCOUNTER — Encounter: Payer: Self-pay | Admitting: Internal Medicine

## 2015-12-06 ENCOUNTER — Ambulatory Visit (INDEPENDENT_AMBULATORY_CARE_PROVIDER_SITE_OTHER): Payer: BLUE CROSS/BLUE SHIELD | Admitting: Internal Medicine

## 2015-12-06 VITALS — BP 124/76 | HR 99 | Temp 97.8°F | Ht 64.0 in | Wt 223.2 lb

## 2015-12-06 DIAGNOSIS — Z0000 Encounter for general adult medical examination without abnormal findings: Secondary | ICD-10-CM | POA: Diagnosis not present

## 2015-12-06 DIAGNOSIS — Z23 Encounter for immunization: Secondary | ICD-10-CM | POA: Diagnosis not present

## 2015-12-06 DIAGNOSIS — R350 Frequency of micturition: Secondary | ICD-10-CM

## 2015-12-06 DIAGNOSIS — Z114 Encounter for screening for human immunodeficiency virus [HIV]: Secondary | ICD-10-CM

## 2015-12-06 MED ORDER — GUAIFENESIN-CODEINE 100-10 MG/5ML PO SOLN: 5.0000 mL | Freq: Every evening | ORAL | Status: DC | PRN

## 2015-12-06 MED ORDER — AZELASTINE HCL 0.1 % NA SOLN
2.0000 | Freq: Every evening | NASAL | Status: DC | PRN
Start: 2015-12-06 — End: 2016-11-27

## 2015-12-06 NOTE — Patient Instructions (Signed)
BEFORE YOU LEAVE THE OFFICE: GO TO THE FRONT DESK  Schedule labs to be done within few days (fasting)  Schedule a routine office visit or check up to be done in  6 months     AFTER YOU LEAVE THE OFFICE:  For cough: Flonase 2 sprays in each side of the nose every morning Astelin 2 sprays in each side of the nose every night Continue using codeine syrup as needed. Call if not improving in the next 2 weeks.  Call if the knot at the  right axillary area is not improving within 4 weeks

## 2015-12-06 NOTE — Progress Notes (Signed)
Pre visit review using our clinic review tool, if applicable. No additional management support is needed unless otherwise documented below in the visit note. 

## 2015-12-06 NOTE — Progress Notes (Signed)
Subjective:    Patient ID: Desiree Dunn, female    DOB: 1971/08/01, 45 y.o.   MRN: [id number]  DOS:  12/06/2015 Type of visit - description : CPX Interval history: In general doing well except for cough    Review of Systems seen with bronchitis last month, better but still has persisting cough, mostly at night, unable to sleep well, associated with significant postnasal dripping.  occasionally hears wheezing but no frequently. Also mild urinary frequency worse at night without other urinary symptoms This is a slightly hoarse, she thinks related to the frequent cough  Constitutional: No fever. No chills. No unexplained wt changes. No unusual sweats  HEENT: No dental problems, no ear discharge, no facial swelling . No eye discharge, no eye  redness , no  intolerance to light   Respiratory: no  difficulty breathing.    Cardiovascular: No CP, no leg swelling , no  Palpitations  GI: no nausea, no vomiting, no diarrhea , no  abdominal pain.  No blood in the stools. No dysphagia, no odynophagia    Endocrine: No polyphagia, no polyuria , no polydipsia  GU: No dysuria, gross hematuria, difficulty urinating.  .  Musculoskeletal: No joint swellings or unusual aches or pains  Skin: No change in the color of the skin, palor , no  Rash  Allergic, immunologic: No environmental allergies , no  food allergies  Neurological: No dizziness no  syncope. No headaches. No diplopia, no slurred, no slurred speech, no motor deficits, no facial  Numbness  Hematological: No enlarged lymph nodes, no easy bruising , no unusual bleedings  Psychiatry: No suicidal ideas, no hallucinations, no beavior problems, no confusion.  No unusual/severe anxiety, no depression    Past Medical History  Diagnosis Date  . Miscarriage     x 1  . Ectopic pregnancy 1995     x 1 , R side   . Chronic RLQ pain     since 1995 when she had ectopic preg  . Pelvic congestion     h/o   . Diverticulitis   . GERD  (gastroesophageal reflux disease)   . Migraine without aura and without status migrainosus, not intractable   . Obesity, unspecified 07/06/2010  . Allergy   . Sleep apnea     uses cpap  . HTN (hypertension) 05/24/2015    Past Surgical History  Procedure Laterality Date  . Tubal ligation  2000  . Breast reduction surgery  12/09  . Abdominal [facility]  12/2009    [facility]  . Right ooph[place]omy  12/2009    [facility]  . Colonoscopy  2001    X3 ; diverticulosis    Social History   Social History  . Marital Status: Married    Spouse Name: N/A  . Number of Children: 2  . Years of Education: N/A   Occupational History  . [employer],teacher, [employer] time   . works full time at [employer] Topics  . Smoking status: Never Smoker   . Smokeless tobacco: Never Used  . Alcohol Use: 0.0 oz/week    0 Standard drinks or equivalent per week     Comment: socially - 1 drink every 3 months  . Drug Use: No  . Sexual Activity:    Partners: Male   Other Topics Concern  . Not on file   Social History Narrative   Household-- pt , husband and children   daughter 81   son 39  Family History  Problem Relation Age of Onset  . Stroke Father   . Diabetes Father   . Hypertension Father   . Hyperlipidemia Father   . Heart disease Father   . Heart attack Neg Hx   . Hypertension Sister   . Colon cancer Mother 64  . Breast cancer Other     GM  . Colon polyps Brother   . Colitis Brother   . Colon cancer Maternal Aunt 50      Medication List       This list is accurate as of: 12/06/15 11:59 PM.  Always use your most recent med list.               azelastine 0.1 % nasal spray  Commonly known as:  ASTELIN  Place 2 sprays into both nostrils at bedtime as needed for rhinitis. Use in each nostril as directed     diclofenac 75 MG EC tablet  Commonly known as:  VOLTAREN  Take 1 tablet (75 mg total) by mouth 2 (two) times daily.     fexofenadine  180 MG tablet  Commonly known as:  ALLEGRA  Take 180 mg by mouth daily. Reported on 12/06/2015     guaiFENesin-codeine 100-10 MG/5ML syrup  Take 5 mLs by mouth at bedtime as needed for cough.     multivitamin with minerals Tabs tablet  Take 1 tablet by mouth daily. Reported on 12/06/2015     omeprazole 20 MG capsule  Commonly known as:  PRILOSEC  Take 1 capsule (20 mg total) by mouth daily.     verapamil 120 MG CR tablet  Commonly known as:  CALAN-SR  Take 1 tablet (120 mg total) by mouth at bedtime.           Objective:   Physical Exam BP 124/76 mmHg  Pulse 99  Temp(Src) 97.8 F (36.6 C) (Oral)  Ht 5\' 4"  (1.626 m)  Wt 223 lb 4 oz (101.266 kg)  BMI 38.30 kg/m2  SpO2 99% General:   Well developed, well nourished . NAD.  HEENT:  Normocephalic . Face symmetric, atraumatic. Neck: No thyromegaly Lungs:  CTA B Normal respiratory effort, no intercostal retractions, no accessory muscle use. Heart: RRR,  no murmur.  Abdomen:  Not distended, soft, non-tender. No rebound or rigidity. Breast exam: No dominant mass. Nipples normal.  She does have a soft, 1 cm subcutaneous induration at the right axillary area, rather distally. Slightly TTP, no fluctuant, mild redness. Skin: Not pale. Not jaundice Neurologic:  alert & oriented X3.  Speech normal, gait appropriate for age and unassisted Psych--  Cognition and judgment appear intact.  Cooperative with normal attention span and concentration.  Behavior appropriate. No anxious or depressed appearing.    Assessment & Plan:   Assessment > HTN -- started verapamil 05-2015 OSA , poor CPAP tolerance Obesity  Reactive airway dz anxiety Chronic RLQ abdominal pain since 1995 ectopic pregnancy H/o pelvic congestion GI: GERD, diverticulitis Migraines Obesity  BTL  PLAN: Bronchitis, now w/  persistent cough. Associated PND. Recommend Flonase, Astelin, cough suppressants. Call if no better OSA: poor CPAP tolerance, using a full  mask, recommend to call her supplier provider  and try different options. Urinary frequency: Checking a UA RTC 6 months

## 2015-12-06 NOTE — Assessment & Plan Note (Addendum)
Td 2014, pnm 23-- today Had a Flu shot  s/p hysterectomy and unilateral oophorectomy for benign reason. Remote abnormal PAP , subsequent PAPs wnl-- per guidelines, no further cervical screening indicated  MMG 2015 neg, has one schedule for next week Breast exam today negative except for a small induration at the right axillary area. I think is a cyst (reports a similar episode on the other side). It is not better she is to let me know.  FH colon ca and polyps: Cscope 2001, 2006 and again 11-2009 d/t  rectal bleed, neg except for tics, Cscope 01-2015, next 5 years per letter    Diet and exercise discussed Labs: CMP, FLP, CBC, HIV

## 2015-12-07 ENCOUNTER — Encounter: Payer: Self-pay | Admitting: Podiatry

## 2015-12-07 ENCOUNTER — Ambulatory Visit (INDEPENDENT_AMBULATORY_CARE_PROVIDER_SITE_OTHER): Payer: BLUE CROSS/BLUE SHIELD | Admitting: Podiatry

## 2015-12-07 DIAGNOSIS — M722 Plantar fascial fibromatosis: Secondary | ICD-10-CM

## 2015-12-07 DIAGNOSIS — M7662 Achilles tendinitis, left leg: Secondary | ICD-10-CM

## 2015-12-07 MED ORDER — TRIAMCINOLONE ACETONIDE 10 MG/ML IJ SUSP
10.0000 mg | Freq: Once | INTRAMUSCULAR | Status: AC
  Administered 2015-12-07: 10 mg

## 2015-12-07 NOTE — Progress Notes (Signed)
Subjective:     Patient ID: Desiree Dunn, female   DOB: 1971/08/12, 45 y.o.   MRN: [id number]  HPI patient states I'm doing pretty well but I am having some pain in the back of my left heel still and it's hard for me to wear the boot all the time and my bottom my heel can still hurts on   Review of Systems     Objective:   Physical Exam Neurovascular status intact with continued discomfort posterior lateral heel left with inflammation fluid buildup and moderate depression of the arch with mild plantar pain noted    Assessment:     Achilles tendinitis left lateral side along with continue plantar fasciitis and structural arch issues    Plan:     Reviewed all conditions and did careful injection of the lateral Achilles after explaining chances for rupture which she understands and injected with 3 mg dexamethasone Kenalog 5 mill grams Xylocaine and advised on using her boot full-time for the next week and then gradual reduction and scanned for orthotics at this time

## 2015-12-13 ENCOUNTER — Other Ambulatory Visit (INDEPENDENT_AMBULATORY_CARE_PROVIDER_SITE_OTHER): Payer: BLUE CROSS/BLUE SHIELD

## 2015-12-13 DIAGNOSIS — R350 Frequency of micturition: Secondary | ICD-10-CM | POA: Diagnosis not present

## 2015-12-13 DIAGNOSIS — Z0000 Encounter for general adult medical examination without abnormal findings: Secondary | ICD-10-CM

## 2015-12-13 DIAGNOSIS — Z114 Encounter for screening for human immunodeficiency virus [HIV]: Secondary | ICD-10-CM

## 2015-12-13 LAB — COMPREHENSIVE METABOLIC PANEL
ALBUMIN: 4.2 g/dL (ref 3.5–5.2)
ALK PHOS: 60 U/L (ref 39–117)
ALT: 21 U/L (ref 0–35)
AST: 19 U/L (ref 0–37)
BILIRUBIN TOTAL: 0.4 mg/dL (ref 0.2–1.2)
BUN: 10 mg/dL (ref 6–23)
CALCIUM: 9.1 mg/dL (ref 8.4–10.5)
CO2: 25 mEq/L (ref 19–32)
Chloride: 107 mEq/L (ref 96–112)
Creatinine, Ser: 0.87 mg/dL (ref 0.40–1.20)
GFR: 90.15 mL/min (ref >60.00)
Glucose, Bld: 88 mg/dL (ref 70–99)
POTASSIUM: 3.8 meq/L (ref 3.5–5.1)
Sodium: 140 mEq/L (ref 135–145)
TOTAL PROTEIN: 7.4 g/dL (ref 6.0–8.3)

## 2015-12-13 LAB — URINALYSIS, ROUTINE W REFLEX MICROSCOPIC
Bilirubin Urine: NEGATIVE
Hgb urine dipstick: NEGATIVE
KETONES UR: NEGATIVE
Leukocytes, UA: NEGATIVE
Nitrite: NEGATIVE
RBC / HPF: NONE SEEN (ref 0–?)
SPECIFIC GRAVITY, URINE: 1.01 (ref 1.000–1.030)
TOTAL PROTEIN, URINE-UPE24: NEGATIVE
URINE GLUCOSE: NEGATIVE
UROBILINOGEN UA: 0.2 (ref 0.0–1.0)
WBC UA: NONE SEEN (ref 0–?)
pH: 6 (ref 5.0–8.0)

## 2015-12-13 LAB — CBC WITH DIFFERENTIAL/PLATELET
BASOS ABS: 0 10*3/uL (ref 0.0–0.1)
BASOS PCT: 0.6 % (ref 0.0–3.0)
EOS ABS: 0.1 10*3/uL (ref 0.0–0.7)
EOS PCT: 1.5 % (ref 0.0–5.0)
HEMATOCRIT: 39.1 % (ref 36.0–46.0)
HEMOGLOBIN: 12.9 g/dL (ref 12.0–15.0)
LYMPHS ABS: 1.9 10*3/uL (ref 0.7–4.0)
Lymphocytes Relative: 40.6 % (ref 12.0–46.0)
MCHC: 32.9 g/dL (ref 30.0–36.0)
MCV: 90.6 fl (ref 78.0–100.0)
MONO ABS: 0.4 10*3/uL (ref 0.1–1.0)
MONOS PCT: 8.3 % (ref 3.0–12.0)
NEUTROS ABS: 2.4 10*3/uL (ref 1.4–7.7)
Neutrophils Relative %: 49 % (ref 43.0–77.0)
PLATELETS: 207 10*3/uL (ref 150.0–400.0)
RBC: 4.31 Mil/uL (ref 3.87–5.11)
RDW: 13.9 % (ref 11.5–15.5)
WBC: 4.8 10*3/uL (ref 4.0–10.5)

## 2015-12-13 LAB — LIPID PANEL
CHOLESTEROL: 180 mg/dL (ref 0–200)
HDL: 64 mg/dL (ref >39.00)
LDL Cholesterol: 103 mg/dL — ABNORMAL HIGH (ref 0–99)
NONHDL: 115.54
TRIGLYCERIDES: 63 mg/dL (ref 0.0–149.0)
Total CHOL/HDL Ratio: 3
VLDL: 12.6 mg/dL (ref 0.0–40.0)

## 2015-12-13 LAB — HIV ANTIBODY (ROUTINE TESTING W REFLEX): HIV: NONREACTIVE

## 2015-12-28 ENCOUNTER — Ambulatory Visit: Payer: BLUE CROSS/BLUE SHIELD | Admitting: *Deleted

## 2015-12-28 DIAGNOSIS — M722 Plantar fascial fibromatosis: Secondary | ICD-10-CM

## 2015-12-28 NOTE — Progress Notes (Signed)
Patient ID: Desiree Dunn, female   DOB: Jan 02, 1971, 45 y.o.   MRN: [id number] Patient presents for orthotic pick up.  Verbal and written break in and wear instructions given.  Patient will follow up in 4 weeks if symptoms worsen or fail to improve.

## 2015-12-28 NOTE — Patient Instructions (Signed)

## 2016-02-15 ENCOUNTER — Encounter: Payer: BLUE CROSS/BLUE SHIELD | Admitting: Internal Medicine

## 2016-03-01 ENCOUNTER — Ambulatory Visit (INDEPENDENT_AMBULATORY_CARE_PROVIDER_SITE_OTHER): Payer: BLUE CROSS/BLUE SHIELD | Admitting: Podiatry

## 2016-03-01 ENCOUNTER — Encounter: Payer: Self-pay | Admitting: Podiatry

## 2016-03-01 DIAGNOSIS — M722 Plantar fascial fibromatosis: Secondary | ICD-10-CM | POA: Diagnosis not present

## 2016-03-01 DIAGNOSIS — M7662 Achilles tendinitis, left leg: Secondary | ICD-10-CM

## 2016-03-01 MED ORDER — NONFORMULARY OR COMPOUNDED ITEM: 1.0000 g | Freq: Three times a day (TID) | Status: DC

## 2016-03-01 NOTE — Progress Notes (Signed)
Subjective:     Patient ID: Desiree Dunn, female   DOB: [birth year], 45 y.o.   MRN: [id number]  HPI patient states I'm having pain still in the outside of my left foot and my Achilles tendon and it makes it hard for me to wear shoe gear or walk comfortably and it's been going on for a long time   Review of Systems     Objective:   Physical Exam Neurovascular status intact muscle strength adequate with continued discomfort posterior aspect left heel lateral side and into the peroneal complex left    Assessment:     Chronic Achilles tendinitis peroneal tendinitis    Plan:     H&P conditions reviewed with patient. I went ahead today and I discussed treatment options and due to long-standing nature and do not recommend further injections and at this point we'll initiate shockwave therapy on the left lateral Achilles and around the peroneal tendon complex. I educated her on this I dispensed air fracture walker today with instructions on usage and she will be seen back for procedure

## 2016-03-17 ENCOUNTER — Encounter (HOSPITAL_BASED_OUTPATIENT_CLINIC_OR_DEPARTMENT_OTHER): Payer: Self-pay | Admitting: Emergency Medicine

## 2016-03-17 ENCOUNTER — Emergency Department (HOSPITAL_BASED_OUTPATIENT_CLINIC_OR_DEPARTMENT_OTHER)
Admission: EM | Admit: 2016-03-17 | Discharge: 2016-03-17 | Disposition: A | Discharge status: Home / Self Care | Payer: BLUE CROSS/BLUE SHIELD | Attending: Emergency Medicine | Admitting: Emergency Medicine

## 2016-03-17 DIAGNOSIS — L089 Local infection of the skin and subcutaneous tissue, unspecified: Secondary | ICD-10-CM | POA: Diagnosis not present

## 2016-03-17 DIAGNOSIS — Z79899 Other long term (current) drug therapy: Secondary | ICD-10-CM | POA: Diagnosis not present

## 2016-03-17 DIAGNOSIS — I10 Essential (primary) hypertension: Secondary | ICD-10-CM | POA: Diagnosis not present

## 2016-03-17 DIAGNOSIS — E669 Obesity, unspecified: Secondary | ICD-10-CM | POA: Diagnosis not present

## 2016-03-17 DIAGNOSIS — H5711 Ocular pain, right eye: Secondary | ICD-10-CM | POA: Diagnosis present

## 2016-03-17 MED ORDER — IBUPROFEN 800 MG PO TABS: 800.0000 mg | ORAL_TABLET | Freq: Three times a day (TID) | ORAL | Status: DC | PRN

## 2016-03-17 MED ORDER — CEPHALEXIN 500 MG PO CAPS: 1000.0000 mg | ORAL_CAPSULE | Freq: Two times a day (BID) | ORAL | Status: DC

## 2016-03-17 NOTE — ED Notes (Signed)
Red pustule above right eye x2 days. Pain and swelling to the area getting worse.

## 2016-03-17 NOTE — ED Provider Notes (Signed)
CSN: [id number]     Arrival date & time 03/17/16  [id number] History   First MD Initiated Contact with Patient 03/17/16 1023     Chief Complaint  Patient presents with  . Eye Pain     (Consider location/radiation/quality/duration/timing/severity/associated sxs/prior Treatment) HPI Patient presents to the emergency department with a pimple above her right near her eyebrow medially.  It started 2 days ago.  The patient states that she did use some warm compresses on the area, but no other treatment.  She states nothing seems make the condition better or worse.  She states she is not having any blurred vision, eye pain, visual changes or other swelling around the eye.  She states the area is just swollen in the area of the small pimple. The patient denies chest pain, shortness of breath, headache,blurred vision, neck pain, fever, cough, weakness, numbness, dizziness, anorexia, edema, abdominal pain, nausea, vomiting, diarrhea, rash, back pain, dysuria, hematemesis, bloody stool, near syncope, or syncope. Past Medical History  Diagnosis Date  . Miscarriage     x 1  . Ectopic pregnancy 1995     x 1 , R side   . Chronic RLQ pain     since 1995 when she had ectopic preg  . Pelvic congestion     h/o   . Diverticulitis   . GERD (gastroesophageal reflux disease)   . Migraine without aura and without status migrainosus, not intractable   . Obesity, unspecified 07/06/2010  . Allergy   . Sleep apnea     uses cpap  . HTN (hypertension) 05/24/2015   Past Surgical History  Procedure Laterality Date  . Tubal ligation  2000  . Breast reduction surgery  12/09  . Abdominal [facility]  12/2009    [facility]  . Right oophorectomy  12/2009    [facility]  . Colonoscopy  2001    X3 ; diverticulosis   Family History  Problem Relation Age of Onset  . Stroke Father   . Diabetes Father   . Hypertension Father   . Hyperlipidemia Father   . Heart disease Father   . Heart attack Neg Hx   . Hypertension Sister    . Colon cancer Mother 19  . Breast cancer Other     GM  . Colon polyps Brother   . Colitis Brother   . Colon cancer Maternal Aunt 52   Social History  Substance Use Topics  . Smoking status: Never Smoker   . Smokeless tobacco: Never Used  . Alcohol Use: 0.0 oz/week    0 Standard drinks or equivalent per week     Comment: socially - 1 drink every 3 months   OB History    No data available     Review of Systems  All other systems negative except as documented in the HPI. All pertinent positives and negatives as reviewed in the HPI.  Allergies  Amoxicillin  Home Medications   Prior to Admission medications   Medication Sig Start Date End Date Taking? Authorizing Provider  conjugated estrogens (PREMARIN) vaginal cream Place 1 Applicatorful vaginally daily.   Yes Historical Provider, MD  azelastine (ASTELIN) 0.1 % nasal spray Place 2 sprays into both nostrils at bedtime as needed for rhinitis. Use in each nostril as directed 12/06/15   Colon Branch, MD  diclofenac (VOLTAREN) 75 MG EC tablet Take 1 tablet (75 mg total) by mouth 2 (two) times daily. 11/16/15   Wallene Huh, DPM  fexofenadine (ALLEGRA) 180 MG tablet  Take 180 mg by mouth daily. Reported on 12/06/2015    Historical Provider, MD  guaiFENesin-codeine 100-10 MG/5ML syrup Take 5 mLs by mouth at bedtime as needed for cough. 12/06/15   Colon Branch, MD  Multiple Vitamin (MULTIVITAMIN WITH MINERALS) TABS Take 1 tablet by mouth daily. Reported on 12/06/2015    Historical Provider, MD  NONFORMULARY OR COMPOUNDED ITEM Apply 1-2 g topically 3 (three) times daily. Achilles tendonitis cream: 3% diclofenac, 2% Baclofen, 1% Bupivicaine, 6% gabapentin, 3% ibuprofen, 3% pentoxifylline 03/01/16   Wallene Huh, DPM  omeprazole (PRILOSEC) 20 MG capsule Take 1 capsule (20 mg total) by mouth daily. 12/01/12   Colon Branch, MD  verapamil (CALAN-SR) 120 MG CR tablet Take 1 tablet (120 mg total) by mouth at bedtime. 11/08/15   Colon Branch, MD   BP  151/90 mmHg  Pulse 72  Temp(Src) 98.2 F (36.8 C) (Oral)  Resp 16  Ht 5' 4.5" (1.638 m)  Wt 99.791 kg  BMI 37.19 kg/m2  SpO2 97% Physical Exam  Constitutional: She is oriented to person, place, and time.  HENT:  Head: Normocephalic and atraumatic.  Eyes: Conjunctivae and EOM are normal. Pupils are equal, round, and reactive to light. Lids are everted and swept, no foreign bodies found. Right eye exhibits no chemosis, no discharge, no exudate and no hordeolum. Left eye exhibits no chemosis, no discharge, no exudate and no hordeolum.    Neck: Normal range of motion. Neck supple.  Cardiovascular: Normal rate, regular rhythm and normal heart sounds.   Pulmonary/Chest: Effort normal and breath sounds normal.  Neurological: She is alert and oriented to person, place, and time. She exhibits normal muscle tone. Coordination normal.  Nursing note and vitals reviewed.   ED Course  Procedures (including critical care time) Labs Review Labs Reviewed - No data to display  Imaging Review No results found. I have personally reviewed and evaluated these images and lab results as part of my medical decision-making.  The patient be treated for this small pustule and advised to return here if the area gets more swollen, she starts having drainage and redness from the eye or increased redness or warmth around the eye itself.  The patient is advised to use warm compresses much possible on the area   Dalia Heading, PA-C 03/17/16 Tipton Liu, MD 03/17/16 1929

## 2016-03-17 NOTE — Discharge Instructions (Signed)
Return here as needed for any worsening in your condition.  Follow-up with your primary care doctor.  Use warm compresses as much as possible on the area

## 2016-03-23 ENCOUNTER — Telehealth: Payer: Self-pay | Admitting: Internal Medicine

## 2016-03-23 MED ORDER — FLUCONAZOLE 150 MG PO TABS: 150.0000 mg | ORAL_TABLET | Freq: Every day | ORAL | Status: DC

## 2016-03-23 MED ORDER — TERCONAZOLE 0.4 % VA CREA: 1.0000 | TOPICAL_CREAM | Freq: Every day | VAGINAL | Status: DC

## 2016-03-23 NOTE — Telephone Encounter (Signed)
Diflucan and Teraconazole Rx's sent to [facility], tried calling Pt to inform her of below, voicemail box full, unable to leave message.

## 2016-03-23 NOTE — Telephone Encounter (Signed)
Please advise if Pt needs to be seen for ED F/U visit first?

## 2016-03-23 NOTE — Telephone Encounter (Signed)
Diflucan 150 mg one tablet daily for 2 days #2 no refills Also send a refill for terconazole If not better or severe symptoms needs to be seen

## 2016-03-23 NOTE — Telephone Encounter (Signed)
Can be reached: [phone] Pharmacy: [id number] - [place], [place]  Reason for call: Pt was in ER 03/17/16 [facility] for her eye being swollen shut. They put her on ABX. She has a yeast inf and would like diflucan and the cream that starts with T sent in for her if possible.

## 2016-03-26 ENCOUNTER — Ambulatory Visit (INDEPENDENT_AMBULATORY_CARE_PROVIDER_SITE_OTHER): Payer: BLUE CROSS/BLUE SHIELD

## 2016-03-26 DIAGNOSIS — B351 Tinea unguium: Secondary | ICD-10-CM

## 2016-03-26 DIAGNOSIS — M779 Enthesopathy, unspecified: Secondary | ICD-10-CM

## 2016-03-26 NOTE — Progress Notes (Signed)
   Subjective:    Patient ID: Desiree Dunn, female    DOB: 09-28-1971, 45 y.o.   MRN: [id number]  HPI Pt presents stating that she is having a lot of pain and swelling on the lateral side of her left achilles   Review of Systems    Swelling and pain upon palpation noted on lt achilles tendon lateral over medial Objective:   Physical Exam       Tendonitis lt foot with swelling noted on medial and lateral sides Assessment & Plan:  ESWR #1 administered to lateral side of left achilles followed by EPAT treatment to surrounding tissues and calf. 6.6joules administered at 0.10 energy for 3000 pulses. Pt pain at a 7 throughout procedure, but stated pain relief post procedure. Re-appointed in 1 week for ESWR EPAT #2

## 2016-04-02 ENCOUNTER — Ambulatory Visit (INDEPENDENT_AMBULATORY_CARE_PROVIDER_SITE_OTHER): Payer: BLUE CROSS/BLUE SHIELD

## 2016-04-02 DIAGNOSIS — M779 Enthesopathy, unspecified: Secondary | ICD-10-CM

## 2016-04-03 NOTE — Progress Notes (Signed)
   Subjective:    Patient ID: Desiree Dunn, female    DOB: February 26, 1971, 45 y.o.   MRN: [id number]  HPI Pt presents stating that she is having a lot of pain and swelling on the lateral side of her left achilles   Review of Systems    Swelling and pain upon palpation noted on lt achilles tendon lateral over medial Objective:   Physical Exam       Tendonitis lt foot with swelling noted on medial and lateral sides Assessment & Plan:  ESWR #2 administered to lateral side of left achilles followed by EPAT treatment to surrounding tissues and calf. 19joules administered at 0.30 energy for 3000 pulses. Pt pain at a 7 throughout procedure, but stated pain relief post procedure. Re-appointed in 1 week for ESWR EPAT #3

## 2016-04-11 ENCOUNTER — Other Ambulatory Visit: Payer: BLUE CROSS/BLUE SHIELD

## 2016-04-12 ENCOUNTER — Ambulatory Visit (INDEPENDENT_AMBULATORY_CARE_PROVIDER_SITE_OTHER): Payer: BLUE CROSS/BLUE SHIELD | Admitting: Medical

## 2016-04-12 ENCOUNTER — Encounter: Payer: Self-pay | Admitting: Medical

## 2016-04-12 VITALS — BP 136/85 | HR 87 | Temp 98.1°F | Ht 64.0 in | Wt 224.6 lb

## 2016-04-12 DIAGNOSIS — R51 Headache: Secondary | ICD-10-CM

## 2016-04-12 DIAGNOSIS — R030 Elevated blood-pressure reading, without diagnosis of hypertension: Secondary | ICD-10-CM

## 2016-04-12 DIAGNOSIS — R5383 Other fatigue: Secondary | ICD-10-CM

## 2016-04-12 DIAGNOSIS — R519 Headache, unspecified: Secondary | ICD-10-CM

## 2016-04-12 DIAGNOSIS — IMO0001 Reserved for inherently not codable concepts without codable children: Secondary | ICD-10-CM

## 2016-04-12 MED ORDER — CYCLOBENZAPRINE HCL 10 MG PO TABS: ORAL_TABLET | ORAL | Status: DC

## 2016-04-12 NOTE — Progress Notes (Signed)
Subjective:    Patient ID: Desiree Dunn, female    DOB: Feb 11, 1971, 45 y.o.   MRN: [id number]  HPI  Pt in states recently her blood pressure has been high. Pt bp in ED was 151/90. Pt also states this Tuesday checked her bp and bp was 136/91.  Other readings in office area mostly well controlled. Occasional high diastolic in the past about one year ago. Pt is on verapamil for months.   Pt states she has gotten some HA in recent past. Some pressure in her eyes. She is going to see opthalmologist in July. With recent ha not reporting light sensitivity. No vomiting. Pt does not some tightness to her neck/head recently. She has hx of tension HA. 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 Last ha yesterday. Current no gross motor or sensory function deficits.  Pt states overall she is feeling fatigued for very long time. 3-4 months    Pt had small pustule rt eye brow area in ED on 5--04-2016. This eventually did get better. She took antibiotic for that.    Review of Systems  Constitutional: Positive for fatigue. Negative for fever and chills.  Respiratory: Negative for cough, chest tightness, shortness of breath and wheezing.   Cardiovascular: Negative for chest pain and palpitations.  Gastrointestinal: Negative for abdominal pain.  Musculoskeletal: Negative for back pain.  Skin: Negative for rash.  Neurological: Negative for dizziness, seizures, numbness and headaches.       Last ha yesterday.  Hematological: Negative for adenopathy. Does not bruise/bleed easily.  Psychiatric/Behavioral: Negative for behavioral problems. The patient is not nervous/anxious.        Stress.    Past Medical History  Diagnosis Date  . Miscarriage     x 1  . Ectopic pregnancy 1995     x 1 , R side   . Chronic RLQ pain     since 1995 when she had ectopic preg  . Pelvic congestion     h/o   . Diverticulitis   . GERD (gastroesophageal reflux disease)     . Migraine without aura and without status migrainosus, not intractable   . Obesity, unspecified 07/06/2010  . Allergy   . Sleep apnea     uses cpap  . HTN (hypertension) 05/24/2015     Social History   Social History  . Marital Status: Married    Spouse Name: N/A  . Number of Children: 2  . Years of Education: N/A   Occupational History  . [employer],teacher, Par time   . works full time at [employer] Topics  . Smoking status: Never Smoker   . Smokeless tobacco: Never Used  . Alcohol Use: 0.0 oz/week    0 Standard drinks or equivalent per week     Comment: socially - 1 drink every 3 months  . Drug Use: No  . Sexual Activity:    Partners: Male    Birth Control/ Protection: Surgical   Other Topics Concern  . Not on file   Social History Narrative   Household-- pt , husband and children   daughter 44   son 87    Past Surgical History  Procedure Laterality Date  . Tubal ligation  2000  . Breast reduction surgery  12/09  . Abdominal hysterectomy  12/2009    [facility]  . Right oophorectomy  12/2009    [facility]  . Colonoscopy  2001    X3 ; diverticulosis    Family History  Problem Relation Age of Onset  . Stroke Father   . Diabetes Father   . Hypertension Father   . Hyperlipidemia Father   . Heart disease Father   . Heart attack Neg Hx   . Hypertension Sister   . Colon cancer Mother 65  . Breast cancer Other     GM  . Colon polyps Brother   . Colitis Brother   . Colon cancer Maternal Aunt 50    Allergies  Allergen Reactions  . Amoxicillin     Hives    Current Outpatient Prescriptions on File Prior to Visit  Medication Sig Dispense Refill  . azelastine (ASTELIN) 0.1 % nasal spray Place 2 sprays into both nostrils at bedtime as needed for rhinitis. Use in each nostril as directed 30 mL 3  . conjugated estrogens (PREMARIN) vaginal cream Place 1 Applicatorful vaginally daily.    . diclofenac (VOLTAREN) 75 MG EC  tablet Take 1 tablet (75 mg total) by mouth 2 (two) times daily. 50 tablet 2  . fexofenadine (ALLEGRA) 180 MG tablet Take 180 mg by mouth daily. Reported on 12/06/2015    . guaiFENesin-codeine 100-10 MG/5ML syrup Take 5 mLs by mouth at bedtime as needed for cough. 180 mL 0  . ibuprofen (ADVIL,MOTRIN) 800 MG tablet Take 1 tablet (800 mg total) by mouth every 8 (eight) hours as needed. 21 tablet 0  . Multiple Vitamin (MULTIVITAMIN WITH MINERALS) TABS Take 1 tablet by mouth daily. Reported on 12/06/2015    . NONFORMULARY OR COMPOUNDED ITEM Apply 1-2 g topically 3 (three) times daily. Achilles tendonitis cream: 3% diclofenac, 2% Baclofen, 1% Bupivicaine, 6% gabapentin, 3% ibuprofen, 3% pentoxifylline 180 each 3  . omeprazole (PRILOSEC) 20 MG capsule Take 1 capsule (20 mg total) by mouth daily. 30 capsule 6  . terconazole (TERAZOL 7) 0.4 % vaginal cream Place 1 applicator vaginally at bedtime. 45 g 0  . verapamil (CALAN-SR) 120 MG CR tablet Take 1 tablet (120 mg total) by mouth at bedtime. 90 tablet 1   No current facility-administered medications on file prior to visit.    BP 136/85 mmHg  Pulse 87  Temp(Src) 98.1 F (36.7 C) (Oral)  Ht 5\' 4"  (1.626 m)  Wt 224 lb 9.6 oz (101.878 kg)  BMI 38.53 kg/m2  SpO2 98%       Objective:   Physical Exam  General Mental Status- Alert. General Appearance- Not in acute distress.   Skin General: Color- Normal Color. Moisture- Normal Moisture.  Neck Carotid Arteries- Normal color. Moisture- Normal Moisture. No carotid bruits. No JVD. Both trapezius muscles tender to palpation bilateral.  Chest and Lung Exam Auscultation: Breath Sounds:-Normal.  Cardiovascular Auscultation:Rythm- Regular. Murmurs & Other Heart Sounds:Auscultation of the heart reveals- No Murmurs.  Abdomen Inspection:-Inspeection Normal. Palpation/Percussion:Note:No mass. Palpation and Percussion of the abdomen reveal- Non Tender, Non Distended + BS, no rebound or  guarding.   Neurologic Cranial Nerve exam:- CN III-XII intact(No nystagmus), symmetric smile. Drift Test:- No drift. Romberg Exam:- Negative.  Heal to Toe Gait exam:-Normal. Finger to Nose:- Normal/Intact Strength:- 5/5 equal and symmetric strength both upper and lower extremities.      Assessment & Plan:  Your bp is not elevated today but has been in the past. I want you to get otc bp cuff and check daily various times including when relaxed. If you bp is over 140/90 then would add medicine. Considering maybe clonidine.Continue your verapamil.  For your fatigue will get cbc, cmp and tsh.  For your ha that occur intermittently. Will rx flexeril to use at night(when ha present) and can take tylenol. I think your tight and tender trapezius muscles support tension ha diagnosis. If you get ha with worse sign or symptoms(as discussed) then ED evaluation.   Follow up in 3 weeks or as needed

## 2016-04-12 NOTE — Progress Notes (Signed)
Pre visit review using our clinic review tool, if applicable. No additional management support is needed unless otherwise documented below in the visit note. 

## 2016-04-12 NOTE — Patient Instructions (Addendum)
Your bp is not elevated today but has been in the past. I want you to get otc bp cuff and check daily various times including when relaxed. If you bp is over 140/90 then would add medicine. Considering maybe clonidine. Continue your verapamil.  For your fatigue will get cbc, cmp and tsh.  For your ha that occur intermittently. Will rx flexeril to use at night(when ha present) and can take tylenol. I think your tight and tender trapezius muscles support tension ha diagnosis. If you get ha with worse sign or symptoms(as discussed) then ED evaluation.   Follow up in 3 weeks or as needed

## 2016-04-13 LAB — CBC WITH DIFFERENTIAL/PLATELET
BASOS PCT: 1.9 % (ref 0.0–3.0)
Basophils Absolute: 0.1 10*3/uL (ref 0.0–0.1)
EOS ABS: 0.1 10*3/uL (ref 0.0–0.7)
EOS PCT: 1.3 % (ref 0.0–5.0)
HCT: 38.4 % (ref 36.0–46.0)
Hemoglobin: 12.8 g/dL (ref 12.0–15.0)
LYMPHS ABS: 2.3 10*3/uL (ref 0.7–4.0)
Lymphocytes Relative: 43.9 % (ref 12.0–46.0)
MCHC: 33.3 g/dL (ref 30.0–36.0)
MCV: 90.6 fl (ref 78.0–100.0)
MONO ABS: 0.4 10*3/uL (ref 0.1–1.0)
Monocytes Relative: 7.1 % (ref 3.0–12.0)
NEUTROS ABS: 2.4 10*3/uL (ref 1.4–7.7)
NEUTROS PCT: 45.8 % (ref 43.0–77.0)
PLATELETS: 222 10*3/uL (ref 150.0–400.0)
RBC: 4.24 Mil/uL (ref 3.87–5.11)
RDW: 13.9 % (ref 11.5–15.5)
WBC: 5.3 10*3/uL (ref 4.0–10.5)

## 2016-04-13 LAB — COMPREHENSIVE METABOLIC PANEL
ALT: 30 U/L (ref 0–35)
AST: 26 U/L (ref 0–37)
Albumin: 4.3 g/dL (ref 3.5–5.2)
Alkaline Phosphatase: 63 U/L (ref 39–117)
BUN: 11 mg/dL (ref 6–23)
CHLORIDE: 107 meq/L (ref 96–112)
CO2: 25 meq/L (ref 19–32)
CREATININE: 0.87 mg/dL (ref 0.40–1.20)
Calcium: 9.3 mg/dL (ref 8.4–10.5)
GFR: 90.01 mL/min (ref >60.00)
Glucose, Bld: 111 mg/dL — ABNORMAL HIGH (ref 70–99)
POTASSIUM: 3.6 meq/L (ref 3.5–5.1)
SODIUM: 141 meq/L (ref 135–145)
Total Bilirubin: 0.4 mg/dL (ref 0.2–1.2)
Total Protein: 7.4 g/dL (ref 6.0–8.3)

## 2016-04-13 LAB — TSH: TSH: 0.87 u[IU]/mL (ref 0.35–4.50)

## 2016-04-19 ENCOUNTER — Ambulatory Visit: Payer: BLUE CROSS/BLUE SHIELD

## 2016-04-19 DIAGNOSIS — M779 Enthesopathy, unspecified: Secondary | ICD-10-CM

## 2016-04-20 NOTE — Progress Notes (Signed)
   Subjective:    Patient ID: Desiree Dunn, female    DOB: Oct 26, 1971, 45 y.o.   MRN: [id number]  HPI Pt presents stating that she is having a lot of pain and swelling on the lateral side of her left achilles   Review of Systems    Swelling and pain upon palpation noted on lt achilles tendon lateral over medial Objective:   Physical Exam       Tendonitis lt foot with swelling noted on medial and lateral sides Assessment & Plan:  ESWT #3 administered to lateral side of left achilles followed by EPAT treatment to surrounding tissues and calf. 23joules administered at 0.30 energy for 3000 pulses. Pt pain at a 7 throughout procedure, but stated pain relief post procedure. Re-appointed in 4 weeks for re-evaluation

## 2016-05-07 ENCOUNTER — Ambulatory Visit (INDEPENDENT_AMBULATORY_CARE_PROVIDER_SITE_OTHER): Payer: BLUE CROSS/BLUE SHIELD | Admitting: Medical

## 2016-05-07 ENCOUNTER — Encounter: Payer: Self-pay | Admitting: Medical

## 2016-05-07 VITALS — BP 132/81 | HR 78 | Temp 98.1°F | Ht 64.0 in | Wt 224.0 lb

## 2016-05-07 DIAGNOSIS — J3489 Other specified disorders of nose and nasal sinuses: Secondary | ICD-10-CM

## 2016-05-07 DIAGNOSIS — G44209 Tension-type headache, unspecified, not intractable: Secondary | ICD-10-CM

## 2016-05-07 DIAGNOSIS — R030 Elevated blood-pressure reading, without diagnosis of hypertension: Secondary | ICD-10-CM | POA: Diagnosis not present

## 2016-05-07 DIAGNOSIS — G43009 Migraine without aura, not intractable, without status migrainosus: Secondary | ICD-10-CM

## 2016-05-07 DIAGNOSIS — IMO0001 Reserved for inherently not codable concepts without codable children: Secondary | ICD-10-CM

## 2016-05-07 MED ORDER — CYCLOBENZAPRINE HCL 10 MG PO TABS: ORAL_TABLET | ORAL | Status: DC

## 2016-05-07 MED ORDER — FLUTICASONE PROPIONATE 50 MCG/ACT NA SUSP: 2.0000 | Freq: Every day | NASAL | Status: DC

## 2016-05-07 NOTE — Progress Notes (Signed)
Subjective:    Patient ID: Desiree Dunn, female    DOB: May 14, 1971, 45 y.o.   MRN: [id number]  HPI  Pt has been checking her bp at home. Her readings have been about 130/80. She has checked at pharmacy.  Pt fatigue work up was negative. Pt has feels better since has been off from work. She has been off since 16 th.  Pt is still getting some ha on occasion. Not as intense as before. Pt had are getting ha maybe 2 headaches each week. She thinks some pressure around eye and points to ethmoid sinus areas(sneezing at times). Notices pressure at times when about to rain. Pt also mentions she is also going to see optometrist on July 11 th. Both of her parents have glaucoma. I had written her some flexeril on last visit. IN past and even now she had some pressure/pain in neck area. The flexeril helped. It stopped her headache from becoming severe and she slept better.    Review of Systems  Constitutional: Negative for fever, chills, activity change and fatigue.       Energy seems to be some improved.  Respiratory: Negative for cough, chest tightness and shortness of breath.   Cardiovascular: Negative for chest pain, palpitations and leg swelling.  Gastrointestinal: Negative for nausea, vomiting and abdominal pain.  Musculoskeletal: Negative for neck pain and neck stiffness.       See hpi. Some neck pain at times with ha.  Neurological: Positive for headaches. Negative for dizziness, tremors, seizures, syncope, facial asymmetry, speech difficulty, weakness, light-headedness and numbness.       None currently. But see hpi.  Psychiatric/Behavioral: Negative for behavioral problems, confusion and agitation. The patient is not nervous/anxious.     Past Medical History  Diagnosis Date  . Miscarriage     x 1  . Ectopic pregnancy 1995     x 1 , R side   . Chronic RLQ pain     since 1995 when she had ectopic preg  . Pelvic congestion     h/o   . Diverticulitis   . GERD (gastroesophageal  reflux disease)   . Migraine without aura and without status migrainosus, not intractable   . Obesity, unspecified 07/06/2010  . Allergy   . Sleep apnea     uses cpap  . HTN (hypertension) 05/24/2015     Social History   Social History  . Marital Status: Married    Spouse Name: N/A  . Number of Children: 2  . Years of Education: N/A   Occupational History  . [employer],teacher, Par time   . works full time at [employer] Topics  . Smoking status: Never Smoker   . Smokeless tobacco: Never Used  . Alcohol Use: 0.0 oz/week    0 Standard drinks or equivalent per week     Comment: socially - 1 drink every 3 months  . Drug Use: No  . Sexual Activity:    Partners: Male    Birth Control/ Protection: Surgical   Other Topics Concern  . Not on file   Social History Narrative   Household-- pt , husband and children   daughter 22   son 27    Past Surgical History  Procedure Laterality Date  . Tubal ligation  2000  . Breast reduction surgery  12/09  . Abdominal [facility]  12/2009    [facility]  . Right oophorectomy  12/2009    [facility]  .  Colonoscopy  2001    X3 ; diverticulosis    Family History  Problem Relation Age of Onset  . Stroke Father   . Diabetes Father   . Hypertension Father   . Hyperlipidemia Father   . Heart disease Father   . Heart attack Neg Hx   . Hypertension Sister   . Colon cancer Mother 21  . Breast cancer Other     GM  . Colon polyps Brother   . Colitis Brother   . Colon cancer Maternal Aunt 50    Allergies  Allergen Reactions  . Amoxicillin     Hives    Current Outpatient Prescriptions on File Prior to Visit  Medication Sig Dispense Refill  . azelastine (ASTELIN) 0.1 % nasal spray Place 2 sprays into both nostrils at bedtime as needed for rhinitis. Use in each nostril as directed 30 mL 3  . conjugated estrogens (PREMARIN) vaginal cream Place 1 Applicatorful vaginally daily.    . cyclobenzaprine  (FLEXERIL) 10 MG tablet 1 tab po qhs prn muscle spasms or ha 7 tablet 0  . diclofenac (VOLTAREN) 75 MG EC tablet Take 1 tablet (75 mg total) by mouth 2 (two) times daily. 50 tablet 2  . fexofenadine (ALLEGRA) 180 MG tablet Take 180 mg by mouth daily. Reported on 12/06/2015    . guaiFENesin-codeine 100-10 MG/5ML syrup Take 5 mLs by mouth at bedtime as needed for cough. 180 mL 0  . ibuprofen (ADVIL,MOTRIN) 800 MG tablet Take 1 tablet (800 mg total) by mouth every 8 (eight) hours as needed. 21 tablet 0  . Multiple Vitamin (MULTIVITAMIN WITH MINERALS) TABS Take 1 tablet by mouth daily. Reported on 12/06/2015    . NONFORMULARY OR COMPOUNDED ITEM Apply 1-2 g topically 3 (three) times daily. Achilles tendonitis cream: 3% diclofenac, 2% Baclofen, 1% Bupivicaine, 6% gabapentin, 3% ibuprofen, 3% pentoxifylline 180 each 3  . omeprazole (PRILOSEC) 20 MG capsule Take 1 capsule (20 mg total) by mouth daily. 30 capsule 6  . terconazole (TERAZOL 7) 0.4 % vaginal cream Place 1 applicator vaginally at bedtime. 45 g 0  . verapamil (CALAN-SR) 120 MG CR tablet Take 1 tablet (120 mg total) by mouth at bedtime. 90 tablet 1   No current facility-administered medications on file prior to visit.    BP 132/81 mmHg  Pulse 78  Temp(Src) 98.1 F (36.7 C) (Oral)  Ht 5\' 4"  (1.626 m)  Wt 224 lb (101.606 kg)  BMI 38.43 kg/m2  SpO2 98%       Objective:   Physical Exam  General Mental Status- Alert. General Appearance- Not in acute distress.     HEENT Head- Normal. Ear Auditory Canal - Left- Normal. Right - Normal.Tympanic Membrane- Left- Normal. Right- Normal. Eye Sclera/Conjunctiva- Left- Normal. Right- Normal. Nose & Sinuses Nasal Mucosa- Left-  Boggy and Congested. Right-  Boggy and  Congested.Bilateral  No maxillary and no  frontal sinus pressure. Mouth & Throat Lips: Upper Lip- Normal: no dryness, cracking, pallor, cyanosis, or vesicular eruption. Lower Lip-Normal: no dryness, cracking, pallor, cyanosis or  vesicular eruption. Buccal Mucosa- Bilateral- No Aphthous ulcers. Oropharynx- No Discharge or Erythema. Tonsils: Characteristics- Bilateral- No Erythema or Congestion. Size/Enlargement- Bilateral- No enlargement. Discharge- bilateral-None.  Neck Neck- Supple. No Masses. trapezius non-tender presently.   Chest and Lung Exam Auscultation: Breath Sounds:-Normal.  Cardiovascular Auscultation:Rythm- Regular. Murmurs & Other Heart Sounds:Auscultation of the heart reveals- No Murmurs.   Neurologic Cranial Nerve exam:- CN III-XII intact(No nystagmus), symmetric smile. Strength:- 5/5 equal and symmetric  strength both upper and lower extremities.      Assessment & Plan:  Your bp is well controlled today. Continue to check 2-3 times a week. If bp over 140/90 let us know.  For tension headache when you have and at night can use flexeril and (tylenol of ibuprofen).  For occasional sinus pressure in ethmoid sinus region I will rx flonase. Can continue astelin.  Continue verapamil that neurologist wrote you when they evaluated you ha in the past.  Follow up in 2 months or as needed  Cashus Halterman, Percell Miller, [place]

## 2016-05-07 NOTE — Patient Instructions (Addendum)
Your bp is well controlled today. Continue to check 2-3 times a week. If bp over 140/90 let us know.  For tension headache when you have and at night can use flexeril and (tylenol of ibuprofen).  For occasional sinus pressure in ethmoid sinus region I will rx flonase. Can continue astelin.  Continue verapamil that neurologist wrote you when they evaluated you ha in the past.  Follow up in 2 months or as needed

## 2016-05-07 NOTE — Progress Notes (Signed)
Pre visit review using our clinic tool,if applicable. No additional management support is needed unless otherwise documented below in the visit note.  

## 2016-05-17 ENCOUNTER — Ambulatory Visit (INDEPENDENT_AMBULATORY_CARE_PROVIDER_SITE_OTHER): Payer: BLUE CROSS/BLUE SHIELD | Admitting: Podiatry

## 2016-05-17 ENCOUNTER — Encounter: Payer: Self-pay | Admitting: Podiatry

## 2016-05-17 DIAGNOSIS — M722 Plantar fascial fibromatosis: Secondary | ICD-10-CM

## 2016-05-17 DIAGNOSIS — M7662 Achilles tendinitis, left leg: Secondary | ICD-10-CM | POA: Diagnosis not present

## 2016-06-02 NOTE — Progress Notes (Signed)
Subjective:     Patient ID: Desiree Dunn, female   DOB: February 14, 1971, 45 y.o.   MRN: [id number]  HPI patient states I seem to be doing very well with mild discomfort in the posterior heel but quite a bit of improvement which shockwave   Review of Systems     Objective:   Physical Exam Neurovascular status intact with discomfort posterior heel that has improved with inflammation still noted upon deep palpation and mild swelling    Assessment:     Has done well with utilization of shockwave    Plan:     Reviewed condition and at this time dispensed silicone sleeve to try to take stress off posterior heel do physical therapy and wear shoes that do not creating irritation. Reappoint to recheck as needed

## 2016-06-28 ENCOUNTER — Other Ambulatory Visit: Payer: Self-pay | Admitting: Internal Medicine

## 2016-08-27 ENCOUNTER — Ambulatory Visit (INDEPENDENT_AMBULATORY_CARE_PROVIDER_SITE_OTHER): Payer: BLUE CROSS/BLUE SHIELD

## 2016-08-27 DIAGNOSIS — Z23 Encounter for immunization: Secondary | ICD-10-CM

## 2016-09-24 ENCOUNTER — Other Ambulatory Visit: Payer: Self-pay | Admitting: Internal Medicine

## 2016-11-27 ENCOUNTER — Ambulatory Visit (INDEPENDENT_AMBULATORY_CARE_PROVIDER_SITE_OTHER): Payer: BLUE CROSS/BLUE SHIELD | Admitting: Internal Medicine

## 2016-11-27 ENCOUNTER — Encounter: Payer: Self-pay | Admitting: Internal Medicine

## 2016-11-27 VITALS — BP 124/68 | HR 88 | Temp 97.8°F | Resp 14 | Ht 64.0 in | Wt 220.1 lb

## 2016-11-27 DIAGNOSIS — J069 Acute upper respiratory infection, unspecified: Secondary | ICD-10-CM | POA: Diagnosis not present

## 2016-11-27 DIAGNOSIS — B9789 Other viral agents as the cause of diseases classified elsewhere: Secondary | ICD-10-CM

## 2016-11-27 DIAGNOSIS — I10 Essential (primary) hypertension: Secondary | ICD-10-CM

## 2016-11-27 MED ORDER — AZELASTINE HCL 0.1 % NA SOLN: 2.0000 | Freq: Every evening | NASAL | 6 refills | Status: DC | PRN

## 2016-11-27 MED ORDER — FEXOFENADINE HCL 180 MG PO TABS: 180.0000 mg | ORAL_TABLET | Freq: Every day | ORAL | 6 refills | Status: DC

## 2016-11-27 NOTE — Assessment & Plan Note (Signed)
URI: Has upper respiratory sxs for few days, likely a URI with some allergic component. Refill Astelin, Allegra and use Flonase. Call if not improving HTN: Few months ago she d/c verapamil, BP remains normal ( she was taking verapamil mostly for HAs prevention) Migraines: Off CCB's, restart verapamil if needed RTC as recommended use, CPX

## 2016-11-27 NOTE — Progress Notes (Signed)
Subjective:    Patient ID: Desiree Dunn, female    DOB: Feb 01, 1971, 46 y.o.   MRN: [id number]  DOS:  11/27/2016 Type of visit - description : Acute Interval history: Symptoms started 5-6 days ago with sinus pressure, mild nasal discharge, taking Advil, Tylenol and using a humidifier w/o much help. Additionally, she stopped taking verapamil, BPs and doing very well.    Review of Systems  Denies fever chills Some cough with no sputum production + Clear nasal discharge Admits to some sneezing, itchy eyes and itchy nose. No chest congestion.   Past Medical History:  Diagnosis Date  . Allergy   . Chronic RLQ pain    since 1995 when she had ectopic preg  . Diverticulitis   . Ectopic pregnancy 1995    x 1 , R side   . GERD (gastroesophageal reflux disease)   . HTN (hypertension) 05/24/2015  . Migraine without aura and without status migrainosus, not intractable   . Miscarriage    x 1  . Obesity, unspecified 07/06/2010  . Pelvic congestion    h/o   . Sleep apnea    uses cpap    Past Surgical History:  Procedure Laterality Date  . ABDOMINAL HYSTERECTOMY  12/2009   [facility]  . BREAST REDUCTION SURGERY  12/09  . COLONOSCOPY  2001   X3 ; diverticulosis  . RIGHT OOPHORECTOMY  12/2009   [facility]  . TUBAL LIGATION  2000    Social History   Social History  . Marital status: Married    Spouse name: N/A  . Number of children: 2  . Years of education: N/A   Occupational History  . 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 Smoking status: Never Smoker  . Smokeless tobacco: Never Used  . Alcohol use 0.0 oz/week     Comment: socially - 1 drink every 3 months  . Drug use: No  . Sexual activity: Yes    Partners: Male    Birth control/ protection: Surgical   Other Topics Concern  . Not on file   Social History Narrative   Household-- pt , husband and children   daughter 60   son 66       Allergies as of 11/27/2016      Reactions   Amoxicillin    Hives      Medication List       Accurate as of 11/27/16  9:36 PM. Always use your most recent med list.          azelastine 0.1 % nasal spray Commonly known as:  ASTELIN Place 2 sprays into both nostrils at bedtime as needed for rhinitis. Use in each nostril as directed   diclofenac 75 MG EC tablet Commonly known as:  VOLTAREN Take 1 tablet (75 mg total) by mouth 2 (two) times daily.   fexofenadine 180 MG tablet Commonly known as:  ALLEGRA Take 1 tablet (180 mg total) by mouth daily.   fluticasone 50 MCG/ACT nasal spray Commonly known as:  FLONASE Place 2 sprays into both nostrils daily.   ibuprofen 800 MG tablet Commonly known as:  ADVIL,MOTRIN Take 1 tablet (800 mg total) by mouth every 8 (eight) hours as needed.   multivitamin with minerals Tabs tablet Take 1 tablet by mouth daily. Reported on 12/06/2015   omeprazole 20 MG capsule Commonly known as:  PRILOSEC Take 1 capsule (20 mg total) by  mouth daily.          Objective:   Physical Exam BP 124/68 (BP Location: Left Arm, Patient Position: Sitting, Cuff Size: Normal)   Pulse 88   Temp 97.8 F (36.6 C) (Oral)   Resp 14   Ht 5\' 4"  (1.626 m)   Wt 220 lb 2 oz (99.8 kg)   SpO2 98%   BMI 37.78 kg/m  General:   Well developed, well nourished . NAD.  HEENT:  Normocephalic . Face symmetric, atraumatic. TMs slightly bulge but not red. Nose congested, throat normal, sinuses mildly TTP symmetrically at both maxillary and frontal areas Lungs:  CTA B Normal respiratory effort, no intercostal retractions, no accessory muscle use. Heart: RRR,  no murmur.  No pretibial edema bilaterally  Skin: Not pale. Not jaundice Neurologic:  alert & oriented X3.  Speech normal, gait appropriate for age and unassisted Psych--  Cognition and judgment appear intact.  Cooperative with normal attention span and concentration.  Behavior appropriate. No anxious or  depressed appearing.      Assessment & Plan:   Assessment > HTN -- s/p verapamil from 05-2015 to mid 2017 OSA , poor CPAP tolerance Obesity  Reactive airway dz anxiety Chronic RLQ abdominal pain since 1995 ectopic pregnancy H/o pelvic congestion GI: GERD, diverticulitis Migraines - verapamil helped Obesity  BTL  PLAN: URI: Has upper respiratory sxs for few days, likely a URI with some allergic component. Refill Astelin, Allegra and use Flonase. Call if not improving HTN: Few months ago she d/c verapamil, BP remains normal ( she was taking verapamil mostly for HAs prevention) Migraines: Off CCB's, restart verapamil if needed RTC as recommended use, CPX

## 2016-11-27 NOTE — Progress Notes (Signed)
Pre visit review using our clinic review tool, if applicable. No additional management support is needed unless otherwise documented below in the visit note. 

## 2016-11-27 NOTE — Patient Instructions (Addendum)
Rest, fluids , tylenol  For cough:  Take Mucinex DM twice a day as needed until better  For nasal congestion: Use OTC Nasocort or Flonase : 2 nasal sprays on each side of the nose in the morning until you feel better Use ASTELIN a prescribed spray : 2 nasal sprays on each side of the nose at night until you feel better  We are refilling your Allegra for allergies, one tablet daily  Call if not gradually better over the next  10 days  Call anytime if the symptoms are severe   ========== You are due for a physical at your convenience  ========== Take either diclofenac or ibuprofen, they are essentially the same

## 2016-11-30 ENCOUNTER — Other Ambulatory Visit: Payer: Self-pay | Admitting: Obstetrics and Gynecology

## 2016-11-30 DIAGNOSIS — Z9889 Other specified postprocedural states: Secondary | ICD-10-CM

## 2016-11-30 DIAGNOSIS — Z1231 Encounter for screening mammogram for malignant neoplasm of breast: Secondary | ICD-10-CM

## 2016-12-24 ENCOUNTER — Ambulatory Visit: Payer: BLUE CROSS/BLUE SHIELD

## 2017-01-02 ENCOUNTER — Ambulatory Visit
Admission: RE | Admit: 2017-01-02 | Discharge: 2017-01-02 | Disposition: A | Discharge status: Home / Self Care | Payer: BLUE CROSS/BLUE SHIELD | Source: Ambulatory Visit | Attending: Obstetrics and Gynecology | Admitting: Obstetrics and Gynecology

## 2017-01-02 DIAGNOSIS — Z9889 Other specified postprocedural states: Secondary | ICD-10-CM

## 2017-01-02 DIAGNOSIS — Z1231 Encounter for screening mammogram for malignant neoplasm of breast: Secondary | ICD-10-CM

## 2017-01-14 ENCOUNTER — Ambulatory Visit (INDEPENDENT_AMBULATORY_CARE_PROVIDER_SITE_OTHER): Payer: BLUE CROSS/BLUE SHIELD | Admitting: Internal Medicine

## 2017-01-14 ENCOUNTER — Encounter: Payer: Self-pay | Admitting: Internal Medicine

## 2017-01-14 VITALS — BP 124/68 | HR 79 | Temp 98.1°F | Resp 14 | Ht 64.0 in | Wt 223.2 lb

## 2017-01-14 DIAGNOSIS — Z0000 Encounter for general adult medical examination without abnormal findings: Secondary | ICD-10-CM | POA: Diagnosis not present

## 2017-01-14 DIAGNOSIS — R233 Spontaneous ecchymoses: Secondary | ICD-10-CM

## 2017-01-14 DIAGNOSIS — R238 Other skin changes: Secondary | ICD-10-CM

## 2017-01-14 DIAGNOSIS — M2550 Pain in unspecified joint: Secondary | ICD-10-CM

## 2017-01-14 NOTE — Patient Instructions (Signed)
GO TO THE LAB : Get the blood work     GO TO THE FRONT DESK Schedule your next appointment for a   physical exam in one year   Check the  blood pressure 2 or 3 times a month  Be sure your blood pressure is between 110/65 and  140/85. If it is consistently higher or lower, let me know   

## 2017-01-14 NOTE — Assessment & Plan Note (Signed)
Td 2014, pnm 23-- 2017; Had a Flu shot --- to see gyn 03-2017 ---PAPs: , no further cervical screening indicated  ---MMG 12-2016 neg  ---FH colon ca and polyps: Cscope 2001, 2006 and again 11-2009 d/t  rectal bleed, neg except for tics, Cscope 01-2015, next 5 years per letter   Diet and exercise discussed Labs:  BMP, FLP, CBC, A1c, sedimentation rate, PT, PTT

## 2017-01-14 NOTE — Progress Notes (Signed)
Subjective:    Patient ID: Desiree Dunn, female    DOB: [birth year], 46 y.o.   MRN: [id number]  DOS:  01/14/2017 Type of visit - description : CPX  Interval history: In general is doing well although she has some concerns: Aches and pains are more noticeable lately, mostly in the back and shoulders. Denies any fever, chills, rash. No major pain in the wrists or hands, specifically they are not red or warm to touch. She takes occasional NSAIDs and feels better.  Also, has noted easy bruising, when asked admits to occasional gum bleeding. No blood in the stools or blood in the urine.   Review of Systems   Other than above, a 14 point review of systems is negative   Past Medical History:  Diagnosis Date  . Allergy   . Chronic RLQ pain    since 1995 when she had ectopic preg  . Diverticulitis   . Ectopic pregnancy 1995    x 1 , R side   . GERD (gastroesophageal reflux disease)   . HTN (hypertension) 05/24/2015  . Migraine without aura and without status migrainosus, not intractable   . Miscarriage    x 1  . Obesity, unspecified 07/06/2010  . Pelvic congestion    h/o   . Sleep apnea    uses cpap    Past Surgical History:  Procedure Laterality Date  . ABDOMINAL HYSTERECTOMY  12/2009   [facility]  . BREAST REDUCTION SURGERY  12/09  . COLONOSCOPY  2001   X3 ; diverticulosis  . RIGHT OOPHORECTOMY  12/2009   [facility]  . TUBAL LIGATION  2000    Social History   Social History  . Marital status: Married    Spouse name: N/A  . Number of children: 2  . Years of education: N/A   Occupational History  . [employer],teacher, Par time   . works full time at [employer] Topics  . Smoking status: Never Smoker  . Smokeless tobacco: Never Used  . Alcohol use 0.0 oz/week     Comment: socially - 1 drink every 3 months  . Drug use: No  . Sexual activity: Yes    Partners: Male    Birth control/ protection: Surgical   Other Topics Concern    . Not on file   Social History Narrative   Household-- pt , husband and children   daughter 29   son 84     Family History  Problem Relation Age of Onset  . Colon cancer Mother 68  . Colon cancer Maternal Aunt 82  . Stroke Father   . Diabetes Father   . Hypertension Father   . Hyperlipidemia Father   . Heart disease Father   . Hypertension Sister   . Breast cancer Other     GM  . Colon polyps Brother   . Colitis Brother   . Heart attack Neg Hx      Allergies as of 01/14/2017      Reactions   Amoxicillin    Hives      Medication List       Accurate as of 01/14/17 11:59 PM. Always use your most recent med list.          fexofenadine 180 MG tablet Commonly known as:  ALLEGRA Take 1 tablet (180 mg total) by mouth daily.   multivitamin with minerals Tabs tablet Take 1 tablet by mouth daily. Reported on 12/06/2015  omeprazole 20 MG capsule Commonly known as:  PRILOSEC Take 1 capsule (20 mg total) by mouth daily.          Objective:   Physical Exam BP 124/68 (BP Location: Left Arm, Patient Position: Sitting, Cuff Size: Normal)   Pulse 79   Temp 98.1 F (36.7 C) (Oral)   Resp 14   Ht 5\' 4"  (1.626 m)   Wt 223 lb 4 oz (101.3 kg)   SpO2 98%   BMI 38.32 kg/m   General:   Well developed, well nourished . NAD.  Neck: No  thyromegaly  HEENT:  Normocephalic . Face symmetric, atraumatic Lungs:  CTA B Normal respiratory effort, no intercostal retractions, no accessory muscle use. Heart: RRR,  no murmur.  No pretibial edema bilaterally  Abdomen:  Not distended, soft, non-tender. No rebound or rigidity.   Skin: Exposed areas without rash. Not pale. Not jaundice MSK: No synovitis on the hands or wrists Neurologic:  alert & oriented X3.  Speech normal, gait appropriate for age and unassisted Strength symmetric and appropriate for age.  Psych: Cognition and judgment appear intact.  Cooperative with normal attention span and concentration.  Behavior  appropriate. No anxious or depressed appearing.    Assessment & Plan:    Assessment > HTN -- s/p verapamil from 05-2015 to mid 2017 OSA , poor CPAP tolerance Obesity  Reactive airway dz anxiety Chronic RLQ abdominal pain since 1995 ectopic pregnancy H/o pelvic congestion GI: GERD, diverticulitis Migraines - verapamil helped Obesity     PLAN: HTN: Used to be a medication, BP today is very good, continue monitoring BPs Easy bruising: Checkin labs;  CBC, PT and PTT Arthralgias: As described above, no fever chills, rash. Exam without synovitis. Likely early DJD. Recommend to stay active, use nsaids judiciously. Call if not better. Check a sedimentation rate RTC one year

## 2017-01-14 NOTE — Progress Notes (Signed)
Pre visit review using our clinic review tool, if applicable. No additional management support is needed unless otherwise documented below in the visit note. 

## 2017-01-15 LAB — BASIC METABOLIC PANEL
BUN: 11 mg/dL (ref 6–23)
CALCIUM: 9 mg/dL (ref 8.4–10.5)
CO2: 28 mEq/L (ref 19–32)
Chloride: 107 mEq/L (ref 96–112)
Creatinine, Ser: 0.78 mg/dL (ref 0.40–1.20)
GFR: 101.77 mL/min (ref >60.00)
GLUCOSE: 66 mg/dL — AB (ref 70–99)
POTASSIUM: 3.4 meq/L — AB (ref 3.5–5.1)
SODIUM: 140 meq/L (ref 135–145)

## 2017-01-15 LAB — PROTIME-INR
INR: 1.2 ratio — AB (ref 0.8–1.0)
PROTHROMBIN TIME: 12.2 s (ref 9.6–13.1)

## 2017-01-15 LAB — CBC WITH DIFFERENTIAL/PLATELET
BASOS PCT: 0.7 % (ref 0.0–3.0)
Basophils Absolute: 0 10*3/uL (ref 0.0–0.1)
EOS PCT: 1.3 % (ref 0.0–5.0)
Eosinophils Absolute: 0.1 10*3/uL (ref 0.0–0.7)
HCT: 38 % (ref 36.0–46.0)
HEMOGLOBIN: 12.9 g/dL (ref 12.0–15.0)
Lymphocytes Relative: 38.8 % (ref 12.0–46.0)
Lymphs Abs: 2.3 10*3/uL (ref 0.7–4.0)
MCHC: 33.9 g/dL (ref 30.0–36.0)
MCV: 90.3 fl (ref 78.0–100.0)
MONO ABS: 0.6 10*3/uL (ref 0.1–1.0)
MONOS PCT: 9.9 % (ref 3.0–12.0)
Neutro Abs: 2.9 10*3/uL (ref 1.4–7.7)
Neutrophils Relative %: 49.3 % (ref 43.0–77.0)
Platelets: 219 10*3/uL (ref 150.0–400.0)
RBC: 4.21 Mil/uL (ref 3.87–5.11)
RDW: 13.3 % (ref 11.5–15.5)
WBC: 5.9 10*3/uL (ref 4.0–10.5)

## 2017-01-15 LAB — SEDIMENTATION RATE: Sed Rate: 5 mm/hr (ref 0–20)

## 2017-01-15 LAB — LIPID PANEL
CHOLESTEROL: 173 mg/dL (ref 0–200)
HDL: 59.5 mg/dL (ref >39.00)
LDL Cholesterol: 76 mg/dL (ref 0–99)
NONHDL: 113.48
Total CHOL/HDL Ratio: 3
Triglycerides: 186 mg/dL — ABNORMAL HIGH (ref 0.0–149.0)
VLDL: 37.2 mg/dL (ref 0.0–40.0)

## 2017-01-15 LAB — APTT: aPTT: 29 s (ref 23.4–32.7)

## 2017-01-15 LAB — HEMOGLOBIN A1C: Hgb A1c MFr Bld: 5.6 % (ref 4.6–6.5)

## 2017-01-15 NOTE — Assessment & Plan Note (Signed)
HTN: Used to be a medication, BP today is very good, continue monitoring BPs Easy bruising: Checkin labs;  CBC, PT and PTT Arthralgias: As described above, no fever chills, rash. Exam without synovitis. Likely early DJD. Recommend to stay active, use nsaids judiciously. Call if not better. Check a sedimentation rate RTC one year

## 2017-01-29 ENCOUNTER — Encounter: Payer: Self-pay | Admitting: Medical

## 2017-01-29 ENCOUNTER — Ambulatory Visit (INDEPENDENT_AMBULATORY_CARE_PROVIDER_SITE_OTHER): Payer: BLUE CROSS/BLUE SHIELD | Admitting: Medical

## 2017-01-29 VITALS — BP 130/90 | HR 81 | Temp 98.9°F | Ht 64.0 in | Wt 228.2 lb

## 2017-01-29 DIAGNOSIS — J029 Acute pharyngitis, unspecified: Secondary | ICD-10-CM

## 2017-01-29 DIAGNOSIS — J3489 Other specified disorders of nose and nasal sinuses: Secondary | ICD-10-CM | POA: Diagnosis not present

## 2017-01-29 DIAGNOSIS — J301 Allergic rhinitis due to pollen: Secondary | ICD-10-CM

## 2017-01-29 DIAGNOSIS — R0981 Nasal congestion: Secondary | ICD-10-CM | POA: Diagnosis not present

## 2017-01-29 MED ORDER — AZITHROMYCIN 250 MG PO TABS: ORAL_TABLET | ORAL | 0 refills | Status: DC

## 2017-01-29 MED ORDER — FLUTICASONE PROPIONATE 50 MCG/ACT NA SUSP: 2.0000 | Freq: Every day | NASAL | 1 refills | Status: DC

## 2017-01-29 MED ORDER — BENZONATATE 100 MG PO CAPS: 100.0000 mg | ORAL_CAPSULE | Freq: Three times a day (TID) | ORAL | 0 refills | Status: DC | PRN

## 2017-01-29 NOTE — Progress Notes (Signed)
Pre visit review using our clinic review tool, if applicable. No additional management support is needed unless otherwise documented below in the visit note. 

## 2017-01-29 NOTE — Patient Instructions (Addendum)
You appear to have early allergic rhinitis flare with sinus infection.  Continue astelin and allegra.  Adding flonase nasal spray.  Rx benzonatate for cough  Rx azithromycin antibiotic for sinus infection.   Follow up 7-10 days or as needed

## 2017-01-29 NOTE — Progress Notes (Signed)
Subjective:    Patient ID: Desiree Dunn, female    DOB: 08/21/1971, 46 y.o.   MRN: [id number]  HPI  Pt states Friday started feel litte sick with faint st, ear pain, mild fatigue, stuffy nose, nasal congestion, faint hoarse. Cough little at night. No wheezing. Pt feels mild sweats since Saturday. Faint mild soreness since Saturday.  Pt is using her allegra and astelin. Hx of spring alleries.   Review of Systems  Constitutional: Positive for fever. Negative for chills and fatigue.  HENT: Positive for congestion, postnasal drip, sinus pressure, sore throat and voice change. Negative for sneezing.   Respiratory: Negative for chest tightness and wheezing.   Cardiovascular: Negative for chest pain and palpitations.  Gastrointestinal: Negative for abdominal pain.  Musculoskeletal: Negative for back pain, neck pain and neck stiffness.       Fait sore body but greater than 48 hours.  Neurological: Negative for dizziness, seizures, syncope, weakness and headaches.  Hematological: Negative for adenopathy. Does not bruise/bleed easily.  Psychiatric/Behavioral: Negative for confusion.    Past Medical History:  Diagnosis Date  . Allergy   . Chronic RLQ pain    since 1995 when she had ectopic preg  . Diverticulitis   . Ectopic pregnancy 1995    x 1 , R side   . GERD (gastroesophageal reflux disease)   . HTN (hypertension) 05/24/2015  . Migraine without aura and without status migrainosus, not intractable   . Miscarriage    x 1  . Obesity, unspecified 07/06/2010  . Pelvic congestion    h/o   . Sleep apnea    uses cpap     Social History   Social History  . Marital status: Married    Spouse name: N/A  . Number of children: 2  . Years of education: N/A   Occupational History  . [employer],teacher, [employer] time   . works full time at [employer] Topics  . Smoking status: Never Smoker  . Smokeless tobacco: Never Used  . Alcohol use 0.0 oz/week       Comment: socially - 1 drink every 3 months  . Drug use: No  . Sexual activity: Yes    Partners: Male    Birth control/ protection: Surgical   Other Topics Concern  . Not on file   Social History Narrative   Household-- pt , husband and children   daughter 73   son 2    Past Surgical History:  Procedure Laterality Date  . ABDOMINAL HYSTERECTOMY  12/2009   [facility]  . BREAST REDUCTION SURGERY  12/09  . COLONOSCOPY  2001   X3 ; diverticulosis  . RIGHT OOPHORECTOMY  12/2009   [facility]  . TUBAL LIGATION  2000    Family History  Problem Relation Age of Onset  . Colon cancer Mother 11  . Colon cancer Maternal Aunt 47  . Stroke Father   . Diabetes Father   . Hypertension Father   . Hyperlipidemia Father   . Heart disease Father   . Hypertension Sister   . Breast cancer Other     GM  . Colon polyps Brother   . Colitis Brother   . Heart attack Neg Hx     Allergies  Allergen Reactions  . Amoxicillin     Hives    Current Outpatient Prescriptions on File Prior to Visit  Medication Sig Dispense Refill  . fexofenadine (ALLEGRA) 180 MG tablet Take 1 tablet (  180 mg total) by mouth daily. 30 tablet 6  . Multiple Vitamin (MULTIVITAMIN WITH MINERALS) TABS Take 1 tablet by mouth daily. Reported on 12/06/2015    . omeprazole (PRILOSEC) 20 MG capsule Take 1 capsule (20 mg total) by mouth daily. 30 capsule 6   No current facility-administered medications on file prior to visit.     BP 130/90 (BP Location: Right Arm, Patient Position: Sitting, Cuff Size: Large)   Pulse 81   Temp 98.9 F (37.2 C) (Oral)   Ht 5\' 4"  (1.626 m)   Wt 228 lb 3.2 oz (103.5 kg)   SpO2 97%   BMI 39.17 kg/m       Objective:   Physical Exam  General  Mental Status - Alert. General Appearance - Well groomed. Not in acute distress.  Skin Rashes- No Rashes.  HEENT Head- Normal. Ear Auditory Canal - Left- Normal. Right - Normal.Tympanic Membrane- Left- Normal. Right- Normal. Eye  Sclera/Conjunctiva- Left- Normal. Right- Normal. Nose & Sinuses Nasal Mucosa- Left-  Boggy and Congested. Right-  Boggy and  Congested.Bilateral maxillary and frontal sinus pressure. Mouth & Throat Lips: Upper Lip- Normal: no dryness, cracking, pallor, cyanosis, or vesicular eruption. Lower Lip-Normal: no dryness, cracking, pallor, cyanosis or vesicular eruption. Buccal Mucosa- Bilateral- No Aphthous ulcers. Oropharynx- No Discharge or Erythema. +pnd Tonsils: Characteristics- Bilateral- No Erythema or Congestion. Size/Enlargement- Bilateral- No enlargement. Discharge- bilateral-None.  Neck Neck- Supple. No Masses.   Chest and Lung Exam Auscultation: Breath Sounds:-Clear even and unlabored.  Cardiovascular Auscultation:Rythm- Regular, rate and rhythm. Murmurs & Other Heart Sounds:Ausculatation of the heart reveal- No Murmurs.  Lymphatic Head & Neck General Head & Neck Lymphatics: Bilateral: Description- shoddy mild tender submandibular nodes.      Assessment & Plan:  You appear to have early allergic rhinitis flare with sinus infection.  Continue astelin and allegra.  Adding flonase nasal spray.  Rx benzonatate for cough  Rx azithromycin antibiotic for sinus infection.   Follow up 7-10 days or as needed  Tanee Henery, Percell Miller, [employer]

## 2017-02-06 ENCOUNTER — Telehealth: Payer: Self-pay | Admitting: Internal Medicine

## 2017-02-06 MED ORDER — FLUCONAZOLE 150 MG PO TABS: 150.0000 mg | ORAL_TABLET | Freq: Once | ORAL | 0 refills | Status: AC

## 2017-02-06 NOTE — Telephone Encounter (Signed)
Pt called in because she seen provider and was prescribed a antibiotic. She says that she now have a uti. Pt would like to have something called in to the pharmacy for her    [id number] - [place], [address]

## 2017-02-06 NOTE — Telephone Encounter (Signed)
Rx of diflucan sent to pt pharmacy.

## 2017-02-06 NOTE — Telephone Encounter (Signed)
Spoke with pt. She states she normally gets a yeast infection after taking antibiotic. Currently has white, vaginal discharge and vaginal itching. [facility] [place].  Please advise?

## 2017-02-06 NOTE — Telephone Encounter (Signed)
Notified pt. 

## 2017-08-09 ENCOUNTER — Ambulatory Visit (INDEPENDENT_AMBULATORY_CARE_PROVIDER_SITE_OTHER): Payer: BLUE CROSS/BLUE SHIELD | Admitting: Internal Medicine

## 2017-08-09 ENCOUNTER — Encounter: Payer: Self-pay | Admitting: Internal Medicine

## 2017-08-09 VITALS — BP 132/70 | HR 78 | Temp 98.2°F | Resp 14 | Ht 64.0 in | Wt 231.4 lb

## 2017-08-09 DIAGNOSIS — M545 Low back pain: Secondary | ICD-10-CM

## 2017-08-09 MED ORDER — KETOROLAC TROMETHAMINE 10 MG PO TABS: 10.0000 mg | ORAL_TABLET | Freq: Four times a day (QID) | ORAL | 0 refills | Status: DC | PRN

## 2017-08-09 MED ORDER — PREDNISONE 10 MG PO TABS: ORAL_TABLET | ORAL | 0 refills | Status: DC

## 2017-08-09 MED ORDER — CYCLOBENZAPRINE HCL 10 MG PO TABS: 10.0000 mg | ORAL_TABLET | Freq: Every evening | ORAL | 0 refills | Status: DC | PRN

## 2017-08-09 NOTE — Patient Instructions (Signed)
Rest  Heat or cold compresses  Take prednisone as prescribed for few days  Flexeril 10 mg one tablet at bedtime, this is a muscle relaxant, will make you sleepy  For pain: Tylenol 500 mg one or 2 tablets every 8 hours Additionally , you can take Toradol (ketorolac) one tablet 4 times a day as needed  Call if not gradually better  Call if symptoms severe  After you improve start doing some gentle back stretching. See information below    Back Exercises If you have pain in your back, do these exercises 2-3 times each day or as told by your doctor. When the pain goes away, do the exercises once each day, but repeat the steps more times for each exercise (do more repetitions). If you do not have pain in your back, do these exercises once each day or as told by your doctor. Exercises Single Knee to Chest  Do these steps 3-5 times in a row for each leg: 1. Lie on your back on a firm bed or the floor with your legs stretched out. 2. Bring one knee to your chest. 3. Hold your knee to your chest by grabbing your knee or thigh. 4. Pull on your knee until you feel a gentle stretch in your lower back. 5. Keep doing the stretch for 10-30 seconds. 6. Slowly let go of your leg and straighten it.  Pelvic Tilt  Do these steps 5-10 times in a row: 1. Lie on your back on a firm bed or the floor with your legs stretched out. 2. Bend your knees so they point up to the ceiling. Your feet should be flat on the floor. 3. Tighten your lower belly (abdomen) muscles to press your lower back against the floor. This will make your tailbone point up to the ceiling instead of pointing down to your feet or the floor. 4. Stay in this position for 5-10 seconds while you gently tighten your muscles and breathe evenly.  Cat-Cow  Do these steps until your lower back bends more easily: 1. Get on your hands and knees on a firm surface. Keep your hands under your shoulders, and keep your knees under your hips.  You may put padding under your knees. 2. Let your head hang down, and make your tailbone point down to the floor so your lower back is round like the back of a cat. 3. Stay in this position for 5 seconds. 4. Slowly lift your head and make your tailbone point up to the ceiling so your back hangs low (sags) like the back of a cow. 5. Stay in this position for 5 seconds.  Press-Ups  Do these steps 5-10 times in a row: 1. Lie on your belly (face-down) on the floor. 2. Place your hands near your head, about shoulder-width apart. 3. While you keep your back relaxed and keep your hips on the floor, slowly straighten your arms to raise the top half of your body and lift your shoulders. Do not use your back muscles. To make yourself more comfortable, you may change where you place your hands. 4. Stay in this position for 5 seconds. 5. Slowly return to lying flat on the floor.  Bridges  Do these steps 10 times in a row: 1. Lie on your back on a firm surface. 2. Bend your knees so they point up to the ceiling. Your feet should be flat on the floor. 3. Tighten your butt muscles and lift your butt off of the  floor until your waist is almost as high as your knees. If you do not feel the muscles working in your butt and the back of your thighs, slide your feet 1-2 inches farther away from your butt. 4. Stay in this position for 3-5 seconds. 5. Slowly lower your butt to the floor, and let your butt muscles relax.  If this exercise is too easy, try doing it with your arms crossed over your chest. Belly Crunches  Do these steps 5-10 times in a row: 1. Lie on your back on a firm bed or the floor with your legs stretched out. 2. Bend your knees so they point up to the ceiling. Your feet should be flat on the floor. 3. Cross your arms over your chest. 4. Tip your chin a little bit toward your chest but do not bend your neck. 5. Tighten your belly muscles and slowly raise your chest just enough to lift  your shoulder blades a tiny bit off of the floor. 6. Slowly lower your chest and your head to the floor.  Back Lifts Do these steps 5-10 times in a row: 1. Lie on your belly (face-down) with your arms at your sides, and rest your forehead on the floor. 2. Tighten the muscles in your legs and your butt. 3. Slowly lift your chest off of the floor while you keep your hips on the floor. Keep the back of your head in line with the curve in your back. Look at the floor while you do this. 4. Stay in this position for 3-5 seconds. 5. Slowly lower your chest and your face to the floor.  Contact a doctor if:  Your back pain gets a lot worse when you do an exercise.  Your back pain does not lessen 2 hours after you exercise. If you have any of these problems, stop doing the exercises. Do not do them again unless your doctor says it is okay. Get help right away if:  You have sudden, very bad back pain. If this happens, stop doing the exercises. Do not do them again unless your doctor says it is okay. This information is not intended to replace advice given to you by your health care provider. Make sure you discuss any questions you have with your health care provider. Document Released: 12/01/2010 Document Revised: 04/05/2016 Document Reviewed: 12/23/2014 Elsevier Interactive Patient Education  [employer].

## 2017-08-09 NOTE — Progress Notes (Signed)
Subjective:    Patient ID: Desiree Dunn, female    DOB: 1971-06-21, 46 y.o.   MRN: [id number]  DOS:  08/09/2017 Type of visit - description : Acute visit Interval history: Symptoms started 1 week ago with back pain, the pain is throughout the mid and lower back bilaterally but much more noticeable  at the right low back with some radiation to the buttock. She is using Tylenol, heat, ice and over-the-counter topical cream. No much improvement.  She has occasional back pain but this is lasting longer than usual.  Review of Systems Denies any injury or overdoing No acute lower extremity tingling and paresthesias ( she has experienced such symptoms in the past bilaterally). No fever chills No bladder or bowel incontinence   Past Medical History:  Diagnosis Date  . Allergy   . Chronic RLQ pain    since 1995 when she had ectopic preg  . Diverticulitis   . Ectopic pregnancy 1995    x 1 , R side   . GERD (gastroesophageal reflux disease)   . HTN (hypertension) 05/24/2015  . Migraine without aura and without status migrainosus, not intractable   . Miscarriage    x 1  . Obesity, unspecified 07/06/2010  . Pelvic congestion    h/o   . Sleep apnea    uses cpap    Past Surgical History:  Procedure Laterality Date  . ABDOM[facility]AL [facility]Y  12/2009   [facility]  . BREAST REDUCTION SURGERY  12/09  . COLONOSCOPY  2001   X3 ; diverticulosis  . RIGHT OOPHORECTOMY  12/2009   [facility]  . TUBAL LIGATION  2000    Social History   Social History  . Marital status: Married    Spouse name: N/A  . Number of children: 2  . Years of education: N/A   Occupational History  . [employer],teacher, [employer] time   . works full time at [employer] Topics  . Smoking status: Never Smoker  . Smokeless tobacco: Never Used  . Alcohol use 0.0 oz/week     Comment: socially - 1 drink every 3 months  . Drug use: No  . Sexual activity: Yes    Partners: Male   Birth control/ protection: Surgical   Other Topics Concern  . Not on file   Social History Narrative   Household-- pt , husband and children   daughter 75   son 21      Allergies as of 08/09/2017      Reactions   Amoxicillin    Hives      Medication List       Accurate as of 08/09/17 11:59 PM. Always use your most recent med list.          azelastine 0.1 % nasal spray Commonly known as:  ASTELIN As needed at bedtime.   cyclobenzaprine 10 MG tablet Commonly known as:  FLEXERIL Take 1 tablet (10 mg total) by mouth at bedtime as needed for muscle spasms.   fexofenadine 180 MG tablet Commonly known as:  ALLEGRA Take 1 tablet (180 mg total) by mouth daily.   fluticasone 50 MCG/ACT nasal spray Commonly known as:  FLONASE Place 2 sprays into both nostrils daily.   ketorolac 10 MG tablet Commonly known as:  TORADOL Take 1 tablet (10 mg total) by mouth every 6 (six) hours as needed.   lisinopril 10 MG tablet Commonly known as:  PRINIVIL,ZESTRIL Take 1 tablet by mouth daily.  multivitamin with minerals Tabs tablet Take 1 tablet by mouth daily. Reported on 12/06/2015   omeprazole 20 MG capsule Commonly known as:  PRILOSEC Take 1 capsule (20 mg total) by mouth daily.   predniSONE 10 MG tablet Commonly known as:  DELTASONE 4 tablets x 2 days, 3 tabs x 2 days, 2 tabs x 2 days, 1 tab x 2 days   verapamil 120 MG CR tablet Commonly known as:  CALAN-SR Take 1 tablet by mouth daily.            Discharge Care Instructions        Start     Ordered   08/09/17 0000  predniSONE (DELTASONE) 10 MG tablet     08/09/17 1621   08/09/17 0000  cyclobenzaprine (FLEXERIL) 10 MG tablet  At bedtime PRN     08/09/17 1621   08/09/17 0000  ketorolac (TORADOL) 10 MG tablet  Every 6 hours PRN     08/09/17 1621         Objective:   Physical Exam BP 132/70 (BP Location: Left Arm, Patient Position: Sitting, Cuff Size: Normal)   Pulse 78   Temp 98.2 F (36.8 C) (Oral)    Resp 14   Ht 5\' 4"  (1.626 m)   Wt 231 lb 6 oz (105 kg)   SpO2 97%   BMI 39.72 kg/m  General:   Well developed, well nourished. Not in acute distress but mild antalgic gait and posture not HEENT:  Normocephalic . Face symmetric, atraumatic MSK: Slightly TTP at the lower back more so on the right side.  Skin: Not pale. Not jaundice Neurologic:  alert & oriented X3.  Speech normal, gait appropriate for age and unassisted. Motor and  DTRs symmetric. Straight leg test : question of mild right side lower extremity pain with leg elevation Psych--  Cognition and judgment appear intact.  Cooperative with normal attention span and concentration.  Behavior appropriate. No anxious or depressed appearing.      Assessment & Plan:    Assessment > HTN -- s/p verapamil from 05-2015 to mid 2017 OSA , poor CPAP tolerance Obesity  Reactive airway dz anxiety Chronic RLQ abdominal pain since 1995 ectopic pregnancy H/o pelvic congestion GI: GERD, diverticulitis Migraines - verapamil helped Obesity     PLAN: Acute back pain: No recent injury, question of right-sided radiculopathy. See HPI. Recommend rest,  round of prednisone, Tylenol, Toradol, Flexeril. Once better, she should start gentle stretching. Call if not improving. See AVS

## 2017-08-09 NOTE — Progress Notes (Signed)
Pre visit review using our clinic review tool, if applicable. No additional management support is needed unless otherwise documented below in the visit note. 

## 2017-08-10 NOTE — Assessment & Plan Note (Signed)
Acute back pain: No recent injury, question of right-sided radiculopathy. See HPI. Recommend rest,  round of prednisone, Tylenol, Toradol, Flexeril. Once better, she should start gentle stretching. Call if not improving. See AVS

## 2017-08-20 ENCOUNTER — Ambulatory Visit: Payer: BLUE CROSS/BLUE SHIELD

## 2017-08-29 ENCOUNTER — Ambulatory Visit (INDEPENDENT_AMBULATORY_CARE_PROVIDER_SITE_OTHER): Payer: BLUE CROSS/BLUE SHIELD

## 2017-08-29 DIAGNOSIS — Z23 Encounter for immunization: Secondary | ICD-10-CM

## 2017-09-13 ENCOUNTER — Ambulatory Visit (INDEPENDENT_AMBULATORY_CARE_PROVIDER_SITE_OTHER): Payer: BLUE CROSS/BLUE SHIELD | Admitting: Podiatry

## 2017-09-13 ENCOUNTER — Other Ambulatory Visit: Payer: Self-pay | Admitting: Podiatry

## 2017-09-13 ENCOUNTER — Encounter: Payer: Self-pay | Admitting: Podiatry

## 2017-09-13 ENCOUNTER — Ambulatory Visit (INDEPENDENT_AMBULATORY_CARE_PROVIDER_SITE_OTHER): Payer: BLUE CROSS/BLUE SHIELD

## 2017-09-13 DIAGNOSIS — M2041 Other hammer toe(s) (acquired), right foot: Secondary | ICD-10-CM

## 2017-09-13 DIAGNOSIS — M779 Enthesopathy, unspecified: Secondary | ICD-10-CM

## 2017-09-13 DIAGNOSIS — M79674 Pain in right toe(s): Secondary | ICD-10-CM

## 2017-09-13 DIAGNOSIS — S99921A Unspecified injury of right foot, initial encounter: Secondary | ICD-10-CM

## 2017-09-13 MED ORDER — TRIAMCINOLONE ACETONIDE 10 MG/ML IJ SUSP
10.0000 mg | Freq: Once | INTRAMUSCULAR | Status: AC
  Administered 2017-09-13: 10 mg

## 2017-09-17 NOTE — Progress Notes (Signed)
Subjective:    Patient ID: Desiree Dunn, female   DOB: 46 y.o.   MRN: [id number]   HPI patient states she started to develop a lot of pain on her fourth digit of her right foot and it's making it hard to wear shoe gear comfortably. States does not remember injury and it's been present for several months    Review of Systems  All other systems reviewed and are negative.       Objective:  Physical Exam  Constitutional: She appears well-developed and well-nourished.  Cardiovascular: Intact distal pulses.  Musculoskeletal: Normal range of motion.  Neurological: She is alert.  Skin: Skin is warm.  Nursing note and vitals reviewed.  neurovascular status intact muscle strength adequate range of motion is found to be within normal limits. 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 At this point I did a proximal nerve block I was able to do a small interphalangeal injection of 2 mg dexamethasone Kenalog 2 mg Xylocaine and debrided lesion applied padding and discussed possible digital hammertoe procedure at one point in the future  X-ray indicates there is rotational component problem of the fourth toe right foot

## 2017-10-02 ENCOUNTER — Other Ambulatory Visit: Payer: Self-pay | Admitting: Internal Medicine

## 2018-01-20 ENCOUNTER — Other Ambulatory Visit: Payer: Self-pay | Admitting: Obstetrics and Gynecology

## 2018-01-20 DIAGNOSIS — Z1231 Encounter for screening mammogram for malignant neoplasm of breast: Secondary | ICD-10-CM

## 2018-01-29 ENCOUNTER — Ambulatory Visit: Payer: BLUE CROSS/BLUE SHIELD | Admitting: Podiatry

## 2018-01-29 ENCOUNTER — Encounter: Payer: BLUE CROSS/BLUE SHIELD | Admitting: Podiatry

## 2018-01-30 ENCOUNTER — Encounter: Payer: Self-pay | Admitting: Podiatry

## 2018-01-30 ENCOUNTER — Other Ambulatory Visit: Payer: Self-pay | Admitting: Podiatry

## 2018-01-30 ENCOUNTER — Ambulatory Visit (INDEPENDENT_AMBULATORY_CARE_PROVIDER_SITE_OTHER): Payer: BLUE CROSS/BLUE SHIELD

## 2018-01-30 ENCOUNTER — Ambulatory Visit: Payer: BLUE CROSS/BLUE SHIELD | Admitting: Podiatry

## 2018-01-30 DIAGNOSIS — M2042 Other hammer toe(s) (acquired), left foot: Secondary | ICD-10-CM

## 2018-01-30 DIAGNOSIS — M79671 Pain in right foot: Secondary | ICD-10-CM

## 2018-01-30 NOTE — Progress Notes (Signed)
Subjective:   Patient ID: Desiree Dunn, female   DOB: 47 y.o.   MRN: [id number]   HPI Patient presents stating the left fifth toe and fourth toe are bothering her again and the medication only helped short-term.  I noted my note from last visit which stated right foot but that was actually a mistake in the left foot that we worked on last week.  States that she had relief for about 10 weeks with significant reoccurrence with extreme pain over the last 6-8 weeks   ROS      Objective:  Physical Exam  Neurovascular status intact muscle strength adequate with patient's left fourth and fifth digit showing keratotic lesions are very tender when pressed and make shoe gear difficult.  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  Patient wants surgery and at this time I allowed her to go over consent form reviewing alternative treatments complications associated with this procedure and the fact there is no long-term guarantees.  Patient wants surgery signed consent form and I reviewed the total recovery can take approximately 6 months and may be a significant period of time before she gets back into certain shoes.  Patient is scheduled for outpatient surgery at this time

## 2018-01-30 NOTE — Patient Instructions (Signed)
Pre-Operative Instructions  Congratulations, you have decided to take an important step towards improving your quality of life.  You can be assured that the doctors and staff at [facility] will be with you every step of the way.  Here are some important things you should know:  1. Plan to be at the surgery center/hospital at least 1 (one) hour prior to your scheduled time, unless otherwise directed by the surgical center/hospital staff.  You must have a responsible adult accompany you, remain during the surgery and drive you home.  Make sure you have directions to the surgical center/hospital to ensure you arrive on time. 2. If you are having surgery at [facility] or [facility], you will need a copy of your medical history and physical form from your family physician within one month prior to the date of surgery. We will give you a form for your primary physician to complete.  3. We make every effort to accommodate the date you request for surgery.  However, there are times where surgery dates or times have to be moved.  We will contact you as soon as possible if a change in schedule is required.   4. No aspirin/ibuprofen for one week before surgery.  If you are on aspirin, any non-steroidal anti-inflammatory medications (Mobic, Aleve, Ibuprofen) should not be taken seven (7) days prior to your surgery.  You make take Tylenol for pain prior to surgery.  5. Medications - If you are taking daily heart and blood pressure medications, seizure, reflux, allergy, asthma, anxiety, pain or diabetes medications, make sure you notify the surgery center/hospital before the day of surgery so they can tell you which medications you should take or avoid the day of surgery. 6. No food or drink after midnight the night before surgery unless directed otherwise by surgical center/hospital staff. 7. No alcoholic beverages 24-hours prior to surgery.  No smoking 24-hours prior or 24-hours after  surgery. 8. Wear loose pants or shorts. They should be loose enough to fit over bandages, boots, and casts. 9. Don't wear slip-on shoes. Sneakers are preferred. 10. Bring your boot with you to the surgery center/hospital.  Also bring crutches or a walker if your physician has prescribed it for you.  If you do not have this equipment, it will be provided for you after surgery. 11. If you have not been contacted by the surgery center/hospital by the day before your surgery, call to confirm the date and time of your surgery. 12. Leave-time from work may vary depending on the type of surgery you have.  Appropriate arrangements should be made prior to surgery with your employer. 13. Prescriptions will be provided immediately following surgery by your doctor.  Fill these as soon as possible after surgery and take the medication as directed. Pain medications will not be refilled on weekends and must be approved by the doctor. 14. Remove nail polish on the operative foot and avoid getting pedicures prior to surgery. 15. Wash the night before surgery.  The night before surgery wash the foot and leg well with water and the antibacterial soap provided. Be sure to pay special attention to beneath the toenails and in between the toes.  Wash for at least three (3) minutes. Rinse thoroughly with water and dry well with a towel.  Perform this wash unless told not to do so by your physician.  Enclosed: 1 Ice pack (please put in freezer the night before surgery)   1 Hibiclens skin cleaner     Pre-op instructions  If you have any questions regarding the instructions, please do not hesitate to call our office.  [place]: [address], [place], [place] [address] -- [phone]  [place]: [address] [place], [place] [address] -- [phone]  [place]: [address]  [place], [place] [address] -- [phone]  [place]: [address], [address], [place],  [address] -- [phone]  Website:  [email or web address] 

## 2018-02-06 ENCOUNTER — Ambulatory Visit
Admission: RE | Admit: 2018-02-06 | Discharge: 2018-02-06 | Disposition: A | Discharge status: Home / Self Care | Payer: BLUE CROSS/BLUE SHIELD | Source: Ambulatory Visit | Attending: Obstetrics and Gynecology | Admitting: Obstetrics and Gynecology

## 2018-02-06 DIAGNOSIS — Z1231 Encounter for screening mammogram for malignant neoplasm of breast: Secondary | ICD-10-CM

## 2018-02-25 ENCOUNTER — Encounter: Payer: Self-pay | Admitting: Podiatry

## 2018-02-25 ENCOUNTER — Telehealth: Payer: Self-pay | Admitting: Podiatry

## 2018-02-25 DIAGNOSIS — M2042 Other hammer toe(s) (acquired), left foot: Secondary | ICD-10-CM | POA: Diagnosis not present

## 2018-02-25 MED ORDER — TRAMADOL HCL 50 MG PO TABS: ORAL_TABLET | ORAL | 0 refills | Status: DC

## 2018-02-25 NOTE — Telephone Encounter (Signed)
Pt states she can not tolerate vicodin, and doesn't know any other pain medications, but can take Tylenol. I told pt I would check with another doctor in the office for a change of medication.

## 2018-02-25 NOTE — Telephone Encounter (Signed)
Dr. Jacqualyn Posey ordered Tramadol. I informed pt of the new orders. Called rx to [phone].

## 2018-02-25 NOTE — Addendum Note (Signed)
Addended by: Harriett Sine D on: 02/25/2018 05:09 PM   Modules accepted: Orders

## 2018-02-25 NOTE — Telephone Encounter (Signed)
I had surgery on my left foot this morning with Dr. Paulla Dolly. I cannot take hydrocodone as it is just too strong for my stomach. I was calling to see if there was another prescription that could be called in for pain. The best number to reach me is [phone]. Thank you.

## 2018-03-05 ENCOUNTER — Ambulatory Visit (INDEPENDENT_AMBULATORY_CARE_PROVIDER_SITE_OTHER): Payer: BLUE CROSS/BLUE SHIELD | Admitting: Podiatry

## 2018-03-05 ENCOUNTER — Ambulatory Visit (INDEPENDENT_AMBULATORY_CARE_PROVIDER_SITE_OTHER): Payer: BLUE CROSS/BLUE SHIELD

## 2018-03-05 ENCOUNTER — Encounter: Payer: Self-pay | Admitting: Podiatry

## 2018-03-05 DIAGNOSIS — M2042 Other hammer toe(s) (acquired), left foot: Secondary | ICD-10-CM

## 2018-03-05 NOTE — Progress Notes (Signed)
Subjective:   Patient ID: Desiree Dunn, female   DOB: 47 y.o.   MRN: [id number]   HPI Patient presents stating doing very well with her surgery with minimal discomfort   ROS      Objective:  Physical Exam  Neurovascular status intact with digits 4 5 left is healing well with wound edges well coapted stitches in place     Assessment:  Doing well post arthroplasty digit 4 5 left     Plan:  Reviewed condition and reapplied sterile dressing instructed on continued elevation at times and reappoint 2 weeks suture removal or earlier if needed  X-rays indicate the bone has been satisfactory resected

## 2018-03-19 ENCOUNTER — Encounter: Payer: Self-pay | Admitting: Podiatry

## 2018-03-19 ENCOUNTER — Ambulatory Visit (INDEPENDENT_AMBULATORY_CARE_PROVIDER_SITE_OTHER): Payer: BLUE CROSS/BLUE SHIELD

## 2018-03-19 ENCOUNTER — Ambulatory Visit (INDEPENDENT_AMBULATORY_CARE_PROVIDER_SITE_OTHER): Payer: BLUE CROSS/BLUE SHIELD | Admitting: Podiatry

## 2018-03-19 DIAGNOSIS — M2042 Other hammer toe(s) (acquired), left foot: Secondary | ICD-10-CM | POA: Diagnosis not present

## 2018-03-19 DIAGNOSIS — M79676 Pain in unspecified toe(s): Secondary | ICD-10-CM

## 2018-03-19 NOTE — Progress Notes (Signed)
Subjective:   Patient ID: Desiree Dunn, female   DOB: 47 y.o.   MRN: [id number]   HPI Patient presents stating she is doing real well with her stitches intact   ROS      Objective:  Physical Exam  Digits 4 5 left in good alignment with good digital position and wound edges well coapted stitches intact     Assessment:  Doing well post arthroplasty digits 4 5 left     Plan:  Reviewed x-rays stitches removed and advised patient on wider shoes and patient will be seen back as needed but this should heal uneventfully  X-rays indicate satisfactory resection of bone digits 4 5 left proximal phalanx

## 2018-03-26 ENCOUNTER — Encounter: Payer: Self-pay | Admitting: Podiatry

## 2018-03-27 NOTE — Progress Notes (Signed)
This encounter was created in error - please disregard.

## 2018-05-14 ENCOUNTER — Ambulatory Visit
Admission: RE | Admit: 2018-05-14 | Discharge: 2018-05-14 | Disposition: A | Discharge status: Home / Self Care | Payer: BLUE CROSS/BLUE SHIELD | Source: Ambulatory Visit | Attending: Obstetrics and Gynecology | Admitting: Obstetrics and Gynecology

## 2018-05-14 ENCOUNTER — Other Ambulatory Visit: Payer: Self-pay | Admitting: Obstetrics and Gynecology

## 2018-05-14 ENCOUNTER — Other Ambulatory Visit (HOSPITAL_COMMUNITY)
Admit: 2018-05-14 | Discharge: 2018-05-14 | Disposition: A | Discharge status: Home / Self Care | Payer: BLUE CROSS/BLUE SHIELD | Source: Other Acute Inpatient Hospital | Attending: Diagnostic Radiology | Admitting: Diagnostic Radiology

## 2018-05-14 DIAGNOSIS — N611 Abscess of the breast and nipple: Secondary | ICD-10-CM

## 2018-05-21 ENCOUNTER — Other Ambulatory Visit: Payer: Self-pay | Admitting: Obstetrics and Gynecology

## 2018-05-21 ENCOUNTER — Ambulatory Visit
Admission: RE | Admit: 2018-05-21 | Discharge: 2018-05-21 | Disposition: A | Discharge status: Home / Self Care | Payer: BLUE CROSS/BLUE SHIELD | Source: Ambulatory Visit | Attending: Obstetrics and Gynecology | Admitting: Obstetrics and Gynecology

## 2018-05-21 DIAGNOSIS — N611 Abscess of the breast and nipple: Secondary | ICD-10-CM

## 2018-05-21 LAB — AEROBIC/ANAEROBIC CULTURE (SURGICAL/DEEP WOUND)

## 2018-05-21 LAB — AEROBIC/ANAEROBIC CULTURE W GRAM STAIN (SURGICAL/DEEP WOUND)

## 2018-06-12 ENCOUNTER — Encounter: Payer: Self-pay | Admitting: Neurology

## 2018-06-16 ENCOUNTER — Encounter: Payer: Self-pay | Admitting: Neurology

## 2018-06-16 ENCOUNTER — Ambulatory Visit (INDEPENDENT_AMBULATORY_CARE_PROVIDER_SITE_OTHER): Payer: BLUE CROSS/BLUE SHIELD | Admitting: Neurology

## 2018-06-16 VITALS — BP 136/94 | HR 86 | Ht 64.0 in | Wt 227.0 lb

## 2018-06-16 DIAGNOSIS — E6609 Other obesity due to excess calories: Secondary | ICD-10-CM | POA: Diagnosis not present

## 2018-06-16 DIAGNOSIS — G4733 Obstructive sleep apnea (adult) (pediatric): Secondary | ICD-10-CM

## 2018-06-16 DIAGNOSIS — G43009 Migraine without aura, not intractable, without status migrainosus: Secondary | ICD-10-CM

## 2018-06-16 DIAGNOSIS — G4452 New daily persistent headache (NDPH): Secondary | ICD-10-CM

## 2018-06-16 DIAGNOSIS — J3489 Other specified disorders of nose and nasal sinuses: Secondary | ICD-10-CM | POA: Diagnosis not present

## 2018-06-16 DIAGNOSIS — Z6838 Body mass index (BMI) 38.0-38.9, adult: Secondary | ICD-10-CM

## 2018-06-16 DIAGNOSIS — I10 Essential (primary) hypertension: Secondary | ICD-10-CM

## 2018-06-16 NOTE — Progress Notes (Signed)
SLEEP MEDICINE CLINIC   Provider:  Larey Seat, M D  Primary Care Physician:  Glendale Chard, MD   Referring Provider: Glendale Chard, MD    Chief Complaint  Patient presents with  . New Patient (Initial Visit)    pt alone, rm 11 pt states that she was diagnosed with sleep apnea over 5 years ago. she states her sleep has become more interupted. she is suffering with headaches, high blood pressure, and restlessness with sleep. willing to look at restarting treatment if needed     HPI:  Desiree Dunn is a 47 y.o. female patient who had been evaluated for sleep apnea in 2012 ,after a referral by Dr. Erling Cruz, and was diagnosed with OSA, given CPAP but couldn't get used to it.  She is seen here in a referral from Dr. Baird Cancer. She has a partial hysterectomy 2-/2011, left one ovary and has now still menopausal symptoms at night.  Chief complaint according to patient : She feels her energy has decreased greatly.    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  Average time on week days to return home is between 6 and 7 PM, dinner at home bedtime at 11.00 - her bedroom is cool, quiet and dark. Sleeps on her right side, on 2 pillows, she is usually asleep within 30 minutes. Her husband reports her to snore loudly, and she goes to the bathroom 3-4 times each night.  Stays asleep not longer than 2 hours en bloc. sometimes she finds herself unable to return to sleep. Aches and pains and worries . She dreams less she feels. She rises at 6.30 and feels not refreshed nor rested. Has headaches in AM!! Dry mouth, Heartburn, not short of breath, no dizziness, palpitations , but diaphoresis is positively a factor.    Sleep medical history and family sleep history:  OSA not diagnosed in parents, one brother has OSA.  [name] as she was known in 2012 underwent a sleep study ordered by Dr. Morene Antu on 14 September of that year, she had a history of morning migraine headaches, obesity, allergic rhinitis fatigue and was noted to snore.  She was not excessively daytime sleepy at the time but she did endorse depression symptoms.  BMI at the time was 35.  I do not have her baseline results available.  The patient was titrated treated to CPAP so obviously the have to be a study before diagnosing her.  During the night of the titration it was evident that she woke up very frequently, she had mostly central apneas on the CPAP residual AHI was 4.8, RDI 7.9 which indicates that she snores loudly.  Supine AHI was 3.2 there were no PLM's and her heart rate was regular.  It was noted that the previous polysomnography showed mild apnea.  Improvement was under CPAP pressure of 9 cmH2O, snoring treatment was not fully successful the patient required 15 cmH2O for that, but under these high pressures central apneas emerged.  I had written for a nasal mask or nasal pillow at the time.  The order was issued on 07 August 2011.   Social history:  Psychologist, educational, working many side jobs.  Non smoker -  Social drinker, 2 / month.  Caffeine:  coffee  3-5 times a week, not iced tea, no black tea, sodas without caffeine.   Review of Systems: Out of a complete 14 system review, the patient complains of only the following symptoms, and all other reviewed systems are negative. Snoring, fatigue , nasal congestion, chronic sinus pressure, headaches.   Epworth score 9  , Fatigue severity score 62  , depression score 5/15    Social History   Socioeconomic History  . Marital status: Married    Spouse name: Not on file  . Number of children: 2  . Years of education: Not on file  . Highest education level: Not on file  Occupational History  . Occupation: Counsellor, Par time  . Occupation: works full time at [employer]: [place]  . Financial resource strain: Not on file  . Food insecurity:    Worry: Not on file    Inability: Not on file  . Transportation needs:    Medical: Not on file    Non-medical: Not on file  Tobacco Use  . Smoking status: Never Smoker  . Smokeless tobacco: Never Used  Substance and Sexual Activity  . Alcohol use: Yes    Alcohol/week: 0.0 oz    Comment: socially - 1 drink every 3 months  . Drug use: No  . Sexual activity: Yes    Partners: Male    Birth control/protection: Surgical  Lifestyle  . Physical activity:    Days per week: Not on file    Minutes per session: Not on file  . Stress: Not on file  Relationships  . Social connections:    Talks on phone: Not on file    Gets together: Not on file    Attends religious service: Not on file    Active member of club or organization: Not on file    Attends meetings of clubs or organizations: Not on file    Relationship status: Not on file  . Intimate partner violence:    Fear of current or ex partner: Not on file    Emotionally abused: Not on file    Physically abused: Not on file    Forced sexual activity: Not on file  Other Topics Concern  . Not on file  Social History Narrative   Household-- pt , husband and children   daughter 53   son 32    Family History  Problem Relation Age of Onset  . Colon cancer Mother 85  . Colon cancer Maternal Aunt 9  . Stroke Father   . Diabetes Father   . Hypertension Father   . Hyperlipidemia Father   . Heart disease Father   . Hypertension Sister   . Breast cancer Other        GM  . Colon polyps Brother   . Colitis Brother   . Heart attack Neg Hx     Past Medical History:  Diagnosis Date  . Allergy   . Chronic RLQ pain    since 1995 when she had ectopic preg  . Diverticulitis   . Ectopic pregnancy 1995    x 1 , R side   . GERD (gastroesophageal reflux disease)   . HTN (hypertension) 05/24/2015  . Migraine without  aura and without status migrainosus, not intractable   . Miscarriage    x 1  . Obesity, unspecified 07/06/2010  . Pelvic congestion    h/o   . Sleep apnea    uses cpap  Past Surgical History:  Procedure Laterality Date  . ABDOMINAL HYSTERECTOMY  12/2009   [facility]  . BREAST REDUCTION SURGERY  12/09  . COLONOSCOPY  2001   X3 ; diverticulosis  . REDUCTION MAMMAPLASTY Bilateral   . RIGHT OOPHORECTOMY  12/2009   [facility]  . TUBAL LIGATION  2000    Current Outpatient Medications  Medication Sig Dispense Refill  . azelastine (ASTELIN) 0.1 % nasal spray As needed at bedtime.    . cyclobenzaprine (FLEXERIL) 10 MG tablet Take 1 tablet (10 mg total) by mouth at bedtime as needed for muscle spasms. 21 tablet 0  . fexofenadine (ALLEGRA) 180 MG tablet Take 1 tablet (180 mg total) by mouth daily. 30 tablet 5  . fluticasone (FLONASE) 50 MCG/ACT nasal spray Place 2 sprays into both nostrils daily. 16 g 1  . ketorolac (TORADOL) 10 MG tablet Take 1 tablet (10 mg total) by mouth every 6 (six) hours as needed. 20 tablet 0  . lisinopril (PRINIVIL,ZESTRIL) 10 MG tablet Take 1 tablet by mouth daily.    . Multiple Vitamin (MULTIVITAMIN WITH MINERALS) TABS Take 1 tablet by mouth daily. Reported on 12/06/2015    . omeprazole (PRILOSEC) 20 MG capsule Take 1 capsule (20 mg total) by mouth daily. 30 capsule 6  . predniSONE (DELTASONE) 10 MG tablet 4 tablets x 2 days, 3 tabs x 2 days, 2 tabs x 2 days, 1 tab x 2 days 20 tablet 0  . traMADol (ULTRAM) 50 MG tablet Take one tablet every 8 hours prn foot pain. 30 tablet 0  . verapamil (CALAN-SR) 120 MG CR tablet Take 1 tablet by mouth daily.     No current facility-administered medications for this visit.     Allergies as of 06/16/2018 - Review Complete 06/16/2018  Allergen Reaction Noted  . Amoxicillin  09/09/2007    Vitals: BP (!) 136/94   Pulse 86   Ht 5\' 4"  (1.626 m)   Wt 227 lb (103 kg)   BMI 38.96 kg/m  Last Weight:  Wt Readings from Last 1  Encounters:  06/16/18 227 lb (103 kg)   NAT:FTDD mass index is 38.96 kg/m.     Last Height:   Ht Readings from Last 1 Encounters:  06/16/18 5\' 4"  (1.626 m)    Physical exam:  General: The patient is awake, alert and appears not in acute distress. The patient is well groomed. Head: Normocephalic, atraumatic. Neck is supple. Mallampati 5  neck circumference:17 ". Nasal airflow today patent on the right, congested on the left , Retrognathia is  seen.  Cardiovascular:  Regular rate and rhythm , without  murmurs or carotid bruit, and without distended neck veins. Respiratory: Lungs are clear to auscultation. Skin:  Without evidence of edema, or rash Trunk: BMI is 39. The patient's posture is erect   Neurologic exam : The patient is awake and alert, oriented to place and time.   Attention span & concentration ability appears normal.  Speech is fluent,  without  dysarthria, dysphonia or aphasia.  Mood and affect are appropriate.  Cranial nerves: Pupils are equal and briskly reactive to light. Funduscopic exam without evidence of pallor or edema. Extraocular movements  in vertical and horizontal planes intact and without nystagmus. Visual fields by finger perimetry are intact. Hearing to finger rub intact.  Facial sensation intact to fine touch. Trigger point for sinus headaches are positive, retro-orbital pressure.   Facial motor strength is symmetric and tongue and uvula move midline. Shoulder shrug was symmetrical.  Motor exam: Normal tone, muscle bulk and symmetric strength in all extremities.Sensory:  Fine touch, pinprick and vibration were tested in all extremities. Proprioception tested in the upper extremities was normal. Coordination: Rapid alternating movements in the fingers/hands was normal. Finger-to-nose maneuver  normal without evidence of ataxia, dysmetria or tremor. Gait and station: Patient walks without assistive device .Tandem gait is unfragmented. Turns with 3 Steps.  Deep  tendon reflexes: in the upper and lower extremities are symmetric and intact. Babinski maneuver response is downgoing.   Assessment:  After physical and neurologic examination, review of laboratory studies,  Personal review of imaging studies, reports of other /same  Imaging studies, results of polysomnography and / or neurophysiology testing and pre-existing records as far as provided in visit., my assessment is   1) Sinusitis- sinus pressure headaches, and retro- orbital pressure. Headaches cause blurred vision, light nausea, photophobia is endorsed. She wakes with these headaches, often has these for 3 days.  CT sinus order.   2) Capnography needed for morning headaches, and Split study for OSA. She had central treatment emergent sleep apnea in 2012, lets go slow ! She would like a FFM to try this time.     3)   The patient was advised of the nature of the diagnosed disorder , the treatment options and the  risks for general health and wellness arising from not treating the condition.   I spent more than 45 minutes of face to face time with the patient.  Greater than 50% of time was spent in counseling and coordination of care. We have discussed the diagnosis and differential and I answered the patient's questions.   Plan:  Treatment plan and additional workup :  1) Sinus films, ENT evaluation.  2) SPLIT with capnography, SPLIT at AHI 20 - 3% BCBS.  3) obesity - low carb diet - regular meal time.   4) multivitamin b complex.    Larey Seat, MD 11/17/1759, 6:07 PM  Certified in Neurology by ABPN Certified in [place] by [facility] [address] [place], [place] [address]

## 2018-06-17 ENCOUNTER — Encounter (HOSPITAL_BASED_OUTPATIENT_CLINIC_OR_DEPARTMENT_OTHER): Payer: Self-pay

## 2018-06-17 ENCOUNTER — Emergency Department (HOSPITAL_BASED_OUTPATIENT_CLINIC_OR_DEPARTMENT_OTHER)
Admission: EM | Admit: 2018-06-17 | Discharge: 2018-06-18 | Disposition: A | Discharge status: Home / Self Care | Payer: BLUE CROSS/BLUE SHIELD | Attending: Emergency Medicine | Admitting: Emergency Medicine

## 2018-06-17 ENCOUNTER — Emergency Department (HOSPITAL_BASED_OUTPATIENT_CLINIC_OR_DEPARTMENT_OTHER): Payer: BLUE CROSS/BLUE SHIELD

## 2018-06-17 ENCOUNTER — Other Ambulatory Visit: Payer: Self-pay

## 2018-06-17 DIAGNOSIS — R1084 Generalized abdominal pain: Secondary | ICD-10-CM | POA: Diagnosis not present

## 2018-06-17 DIAGNOSIS — I10 Essential (primary) hypertension: Secondary | ICD-10-CM | POA: Insufficient documentation

## 2018-06-17 DIAGNOSIS — N61 Mastitis without abscess: Secondary | ICD-10-CM | POA: Diagnosis not present

## 2018-06-17 DIAGNOSIS — R600 Localized edema: Secondary | ICD-10-CM | POA: Diagnosis present

## 2018-06-17 DIAGNOSIS — Z79899 Other long term (current) drug therapy: Secondary | ICD-10-CM | POA: Insufficient documentation

## 2018-06-17 LAB — CBC WITH DIFFERENTIAL/PLATELET
Basophils Absolute: 0 10*3/uL (ref 0.0–0.1)
Basophils Relative: 0 %
EOS PCT: 1 %
Eosinophils Absolute: 0.1 10*3/uL (ref 0.0–0.7)
HCT: 39.4 % (ref 36.0–46.0)
HEMOGLOBIN: 13.4 g/dL (ref 12.0–15.0)
LYMPHS ABS: 2.9 10*3/uL (ref 0.7–4.0)
LYMPHS PCT: 34 %
MCH: 30.5 pg (ref 26.0–34.0)
MCHC: 34 g/dL (ref 30.0–36.0)
MCV: 89.5 fL (ref 78.0–100.0)
MONOS PCT: 10 %
Monocytes Absolute: 0.8 10*3/uL (ref 0.1–1.0)
NEUTROS PCT: 55 %
Neutro Abs: 4.8 10*3/uL (ref 1.7–7.7)
Platelets: 218 10*3/uL (ref 150–400)
RBC: 4.4 MIL/uL (ref 3.87–5.11)
RDW: 13.4 % (ref 11.5–15.5)
WBC: 8.7 10*3/uL (ref 4.0–10.5)

## 2018-06-17 LAB — URINALYSIS, ROUTINE W REFLEX MICROSCOPIC
Glucose, UA: NEGATIVE mg/dL
HGB URINE DIPSTICK: NEGATIVE
Ketones, ur: 15 mg/dL — AB
Leukocytes, UA: NEGATIVE
Nitrite: NEGATIVE
PH: 6 (ref 5.0–8.0)
Protein, ur: 30 mg/dL — AB

## 2018-06-17 LAB — COMPREHENSIVE METABOLIC PANEL
ALK PHOS: 68 U/L (ref 38–126)
ALT: 32 U/L (ref 0–44)
AST: 33 U/L (ref 15–41)
Albumin: 4.4 g/dL (ref 3.5–5.0)
Anion gap: 12 (ref 5–15)
BILIRUBIN TOTAL: 0.7 mg/dL (ref 0.3–1.2)
BUN: 10 mg/dL (ref 6–20)
CALCIUM: 9.1 mg/dL (ref 8.9–10.3)
CO2: 25 mmol/L (ref 22–32)
CREATININE: 1.16 mg/dL — AB (ref 0.44–1.00)
Chloride: 100 mmol/L (ref 98–111)
GFR calc non Af Amer: 55 mL/min — ABNORMAL LOW (ref >60)
GLUCOSE: 97 mg/dL (ref 70–99)
Potassium: 3.4 mmol/L — ABNORMAL LOW (ref 3.5–5.1)
Sodium: 137 mmol/L (ref 135–145)
TOTAL PROTEIN: 8.2 g/dL — AB (ref 6.5–8.1)

## 2018-06-17 LAB — URINALYSIS, MICROSCOPIC (REFLEX)

## 2018-06-17 LAB — LIPASE, BLOOD: Lipase: 27 U/L (ref 11–51)

## 2018-06-17 MED ORDER — SODIUM CHLORIDE 0.9 % IV BOLUS
1000.0000 mL | Freq: Once | INTRAVENOUS | Status: AC
  Administered 2018-06-17: 1000 mL via INTRAVENOUS

## 2018-06-17 MED ORDER — MORPHINE SULFATE (PF) 4 MG/ML IV SOLN
4.0000 mg | Freq: Once | INTRAVENOUS | Status: AC
  Administered 2018-06-17: 4 mg via INTRAVENOUS
  Filled 2018-06-17: qty 1

## 2018-06-17 MED ORDER — IOPAMIDOL (ISOVUE-300) INJECTION 61%
100.0000 mL | Freq: Once | INTRAVENOUS | Status: AC | PRN
  Administered 2018-06-17: 100 mL via INTRAVENOUS

## 2018-06-17 MED ORDER — IOPAMIDOL (ISOVUE-300) INJECTION 61%
30.0000 mL | Freq: Once | INTRAVENOUS | Status: AC | PRN
  Administered 2018-06-17: 15 mL via ORAL

## 2018-06-17 NOTE — ED Triage Notes (Signed)
Pt c/o abd pain and decreased appetite that started today and gradually became worse. Pt also c/o R breast swelling and tenderness.

## 2018-06-17 NOTE — ED Notes (Signed)
Breast area exam performed with Cyril Mourning, EMT to chaperone and EDP present.

## 2018-06-18 MED ORDER — DOXYCYCLINE HYCLATE 100 MG PO CAPS: 100.0000 mg | ORAL_CAPSULE | Freq: Two times a day (BID) | ORAL | 0 refills | Status: AC

## 2018-06-18 MED ORDER — DICYCLOMINE HCL 10 MG PO CAPS: 10.0000 mg | ORAL_CAPSULE | Freq: Two times a day (BID) | ORAL | 0 refills | Status: DC

## 2018-06-18 MED ORDER — ONDANSETRON 4 MG PO TBDP: 4.0000 mg | ORAL_TABLET | Freq: Three times a day (TID) | ORAL | 0 refills | Status: DC | PRN

## 2018-06-18 NOTE — Discharge Instructions (Signed)
The inflammation and changes to your right breast are very concerning today.  Please take the antibiotic doxycycline as prescribed for coverage of potential bacterial infections.  It is very important that you are evaluated by your OB/GYN or at the breast center to evaluate for the possibility of breast cancer. Please also follow-up with your primary care provider regarding her visit today. Please return to the emergency department for any new or worsening symptoms. You may use the antinausea medication Zofran as prescribed. You may use the medication Bentyl as prescribed for abdominal pain.  Please do not drive or operate heavy machinery while taking this medication because it can make you drowsy. Your urine was sent for culture today to see if it grows bacteria requiring antibiotic treatment. You will be contacted by [facility] regarding positive results. Please be sure to drink plenty of water to avoid dehydration.  Contact a health care provider if: Your abdominal pain changes or gets worse. You are not hungry or you lose weight without trying. You are constipated or have diarrhea for more than 2-3 days. You have pain when you urinate or have a bowel movement. Your abdominal pain wakes you up at night. Your pain gets worse with meals, after eating, or with certain foods. You are throwing up and cannot keep anything down. You have a fever. Get help right away if: Your pain does not go away as soon as your health care provider told you to expect. You cannot stop throwing up. Your pain is only in areas of the abdomen, such as the right side or the left lower portion of the abdomen. You have bloody or black stools, or stools that look like tar. You have severe pain, cramping, or bloating in your abdomen. You have signs of dehydration, such as: Dark urine, very little urine, or no urine. Cracked lips. Dry mouth. Sunken eyes. Sleepiness. Weakness.

## 2018-06-18 NOTE — ED Provider Notes (Signed)
[facility] Provider Note   CSN: [id number] Arrival date & time: 06/17/18  2032     History   Chief Complaint Chief Complaint  Patient presents with  . Abdominal Pain    HPI Desiree Dunn is a 47 y.o. female presenting for 1 day of left-sided abdominal pain.  Patient describes the pain as a throbbing 5/10 in severity that is made worse with trying to eat food.  Patient states that she has not taken any medication for her pain.  She denies vomiting however she does endorse nausea and decreased appetite due to pain.  Patient denies constipation or diarrhea/blood in her stool.  She states her last bowel movement was this morning and was normal.  Patient states that she feels like her belly is larger than normal, particularly on the left side.  In addition patient states that she has had right breast tenderness and swelling for the past few months.  She states that she was seen by a medical provider in May and told she had cellulitis of the breast and was given doxycycline.  She states that this did help with the pain and swelling and she was asymptomatic for a few weeks however she states that the pain and swelling returned approximately 2 months ago however it has not been as severe as the initial presentation. HPI  Past Medical History:  Diagnosis Date  . Allergy   . Chronic RLQ pain    since 1995 when she had ectopic preg  . Diverticulitis   . Ectopic pregnancy 1995    x 1 , R side   . GERD (gastroesophageal reflux disease)   . HTN (hypertension) 05/24/2015  . Migraine without aura and without status migrainosus, not intractable   . Miscarriage    x 1  . Obesity, unspecified 07/06/2010  . Pelvic congestion    h/o   . Sleep apnea    uses cpap    Patient Active Problem List   Diagnosis Date Noted  . PCP NOTES >>> 08/25/2015  . HTN (hypertension) 05/24/2015  . Episodic tension-type headache, not intractable 02/11/2015  . Migraine without  aura and without status migrainosus, not intractable 06/02/2014  . GERD (gastroesophageal reflux disease) 12/01/2012  . CTS (carpal tunnel syndrome) 12/01/2012  . Dermatitis 12/01/2012  . Annual physical exam 03/19/2012  . HEADACHE 12/25/2010  . Obesity 07/06/2010  . Abdominal pain, chronic, right lower quadrant 06/09/2009  . Nausea alone 04/27/2009    Past Surgical History:  Procedure Laterality Date  . ABDOMINAL HYSTERECTOMY  12/2009   [facility]  . BREAST REDUCTION SURGERY  12/09  . COLONOSCOPY  2001   X3 ; diverticulosis  . FOOT SURGERY    . REDUCTION MAMMAPLASTY Bilateral   . RIGHT OOPHORECTOMY  12/2009   [facility]  . TUBAL LIGATION  2000     OB History   None      Home Medications    Prior to Admission medications   Medication Sig Start Date End Date Taking? Authorizing Provider  azelastine (ASTELIN) 0.1 % nasal spray As needed at bedtime. 11/27/16   [provider]  cyclobenzaprine (FLEXERIL) 10 MG tablet Take 1 tablet (10 mg total) by mouth at bedtime as needed for muscle spasms. 08/09/17   Colon Branch, MD  dicyclomine (BENTYL) 10 MG capsule Take 1 capsule (10 mg total) by mouth 2 (two) times daily. 06/18/18   Nuala Alpha A, PA-C  doxycycline (VIBRAMYCIN) 100 MG capsule Take 1 capsule (100  mg total) by mouth 2 (two) times daily for 7 days. 06/18/18 06/25/18  Nuala Alpha A, PA-C  fexofenadine (ALLEGRA) 180 MG tablet Take 1 tablet (180 mg total) by mouth daily. 10/02/17   Colon Branch, MD  fluticasone [facility]) 50 MCG/ACT nasal spray Place 2 sprays into both nostrils daily. 01/29/17   Saguier, Percell Miller, PA-C  ketorolac (TORADOL) 10 MG tablet Take 1 tablet (10 mg total) by mouth every 6 (six) hours as needed. 08/09/17   Colon Branch, MD  lisinopril (PRINIVIL,ZESTRIL) 10 MG tablet Take 1 tablet by mouth daily. 07/30/17   [provider]  Multiple Vitamin (MULTIVITAMIN WITH MINERALS) TABS Take 1 tablet by mouth daily. Reported on 12/06/2015    [provider]  omeprazole (PRILOSEC) 20 MG capsule Take 1 capsule (20 mg total) by mouth daily. 12/01/12   Colon Branch, MD  ondansetron (ZOFRAN ODT) 4 MG disintegrating tablet Take 1 tablet (4 mg total) by mouth every 8 (eight) hours as needed for nausea or vomiting. 06/18/18   Nuala Alpha A, PA-C  predniSONE (DELTASONE) 10 MG tablet 4 tablets x 2 days, 3 tabs x 2 days, 2 tabs x 2 days, 1 tab x 2 days 08/09/17   Colon Branch, MD  traMADol (ULTRAM) 50 MG tablet Take one tablet every 8 hours prn foot pain. 02/25/18   Trula Slade, DPM  verapamil (CALAN-SR) 120 MG CR tablet Take 1 tablet by mouth daily. 07/30/17   [provider]    Family History Family History  Problem Relation Age of Onset  . Colon cancer Mother 80  . Colon cancer Maternal Aunt 76  . Stroke Father   . Diabetes Father   . Hypertension Father   . Hyperlipidemia Father   . Heart disease Father   . Hypertension Sister   . Breast cancer Other        GM  . Colon polyps Brother   . Colitis Brother   . Heart attack Neg Hx     Social History Social History   Tobacco Use  . Smoking status: Never Smoker  . Smokeless tobacco: Never Used  Substance Use Topics  . Alcohol use: Yes    Alcohol/week: 0.0 oz    Comment: socially - 1 drink every 3 months  . Drug use: No     Allergies   Amoxicillin   Review of Systems Review of Systems  Constitutional: Negative.  Negative for chills, fatigue and fever.  HENT: Negative.  Negative for rhinorrhea and sore throat.   Eyes: Negative.  Negative for visual disturbance.  Respiratory: Negative.  Negative for cough and shortness of breath.   Cardiovascular: Negative.  Negative for chest pain.  Gastrointestinal: Positive for abdominal distention, abdominal pain and nausea. Negative for blood in stool, diarrhea and vomiting.  Genitourinary: Negative.  Negative for difficulty urinating, dysuria, hematuria, pelvic pain, urgency, vaginal bleeding and vaginal discharge.    Musculoskeletal: Negative.  Negative for arthralgias and myalgias.  Skin: Positive for color change. Negative for rash.  Neurological: Negative.  Negative for dizziness, weakness, numbness and headaches.    Physical Exam Updated Vital Signs BP (!) 136/92   Pulse 89   Temp 99 F (37.2 C) (Oral)   Resp 16   Ht 5\' 4"  (1.626 m)   Wt 103 kg (227 lb)   SpO2 96%   BMI 38.96 kg/m   Physical Exam  Constitutional: She is oriented to person, place, and time. She appears well-developed and well-nourished.  She does not appear ill. No distress.  HENT:  Head: Normocephalic and atraumatic.  Right Ear: External ear normal.  Left Ear: External ear normal.  Nose: Nose normal.  Eyes: Pupils are equal, round, and reactive to light. EOM are normal.  Neck: Trachea normal and normal range of motion. No tracheal deviation present.  Cardiovascular: Normal rate, regular rhythm, normal heart sounds and intact distal pulses.  Pulmonary/Chest: Effort normal and breath sounds normal. No respiratory distress. Right breast exhibits inverted nipple, skin change and tenderness. Right breast exhibits no nipple discharge. There is breast swelling.    Abdominal: Soft. Normal appearance and bowel sounds are normal. There is tenderness in the left upper quadrant and left lower quadrant. There is no rigidity, no rebound, no guarding, no CVA tenderness, no tenderness at McBurney's point and negative Murphy's sign.  Genitourinary:  Genitourinary Comments: Deferred by patient  Musculoskeletal: Normal range of motion.  Neurological: She is alert and oriented to person, place, and time.  Skin: Skin is warm and dry.  Psychiatric: She has a normal mood and affect. Her behavior is normal.    Breast examination chaperoned by Cyril Mourning, EMT. ED Treatments / Results  Labs (all labs ordered are listed, but only abnormal results are displayed) Labs Reviewed  URINALYSIS, ROUTINE W REFLEX MICROSCOPIC - Abnormal; Notable for  the following components:      Result Value   APPearance HAZY (*)    Specific Gravity, Urine >1.030 (*)    Bilirubin Urine SMALL (*)    Ketones, ur 15 (*)    Protein, ur 30 (*)    All other components within normal limits  COMPREHENSIVE METABOLIC PANEL - Abnormal; Notable for the following components:   Potassium 3.4 (*)    Creatinine, Ser 1.16 (*)    Total Protein 8.2 (*)    GFR calc non Af Amer 55 (*)    All other components within normal limits  URINALYSIS, MICROSCOPIC (REFLEX) - Abnormal; Notable for the following components:   Bacteria, UA MANY (*)    All other components within normal limits  URINE CULTURE  CBC WITH DIFFERENTIAL/PLATELET  LIPASE, BLOOD    EKG None  Radiology Ct Abdomen Pelvis W Contrast  Result Date: 06/17/2018 CLINICAL DATA:  Acute onset of left-sided abdominal pain and generalized abdominal distention. Decreased appetite. EXAM: CT ABDOMEN AND PELVIS WITH CONTRAST TECHNIQUE: Multidetector CT imaging of the abdomen and pelvis was performed using the standard protocol following bolus administration of intravenous contrast. CONTRAST:  195mL ISOVUE-300 IOPAMIDOL (ISOVUE-300) INJECTION 61% COMPARISON:  CT of the abdomen and pelvis performed 04/29/2013, and abdominal ultrasound performed 05/31/2014 FINDINGS: Lower chest: The visualized lung bases are grossly clear. The visualized portions of the mediastinum are unremarkable. Hepatobiliary: A small hepatic cyst is noted, decreased in size from 2014. The liver is otherwise unremarkable. The gallbladder is unremarkable in appearance. The common bile duct remains normal in caliber. Pancreas: The pancreas is within normal limits. Spleen: The spleen is unremarkable in appearance. Adrenals/Urinary Tract: The adrenal glands are unremarkable in appearance. The kidneys are within normal limits. There is no evidence of hydronephrosis. No renal or ureteral stones are identified. No perinephric stranding is seen. Stomach/Bowel: The  stomach is unremarkable in appearance. The small bowel is within normal limits. The appendix is normal in caliber, without evidence of appendicitis. The colon is unremarkable in appearance. Vascular/Lymphatic: The abdominal aorta is unremarkable in appearance. The inferior vena cava is grossly unremarkable. No retroperitoneal lymphadenopathy is seen. No pelvic sidewall lymphadenopathy is  identified. Reproductive: The bladder is mildly distended and unremarkable in appearance. The patient is status post hysterectomy. A small amount of free fluid is noted within the pelvis. The left ovary is unremarkable in appearance. No suspicious adnexal masses are seen. Other: Skin thickening is noted at the right breast, of uncertain significance. Musculoskeletal: No acute osseous abnormalities are identified. The visualized musculature is unremarkable in appearance. IMPRESSION: 1. No acute abnormality seen within the abdomen or pelvis. 2. Small amount of free fluid within the pelvis, likely physiologic in nature. 3. Skin thickening at the right breast, of uncertain significance. Would correlate clinically for evidence of cellulitis and to exclude inflammatory breast cancer. Electronically Signed   By: Garald Balding M.D.   On: 06/17/2018 23:17    Procedures Procedures (including critical care time)  Medications Ordered in ED Medications  sodium chloride 0.9 % bolus 1,000 mL ( Intravenous Stopped 06/17/18 2223)  iopamidol (ISOVUE-300) 61 % injection 30 mL (15 mLs Oral Contrast Given 06/17/18 2238)  iopamidol (ISOVUE-300) 61 % injection 100 mL (100 mLs Intravenous Contrast Given 06/17/18 2239)  morphine 4 MG/ML injection 4 mg (4 mg Intravenous Given 06/17/18 2340)     Initial Impression / Assessment and Plan / ED Course  I have reviewed the triage vital signs and the nursing notes.  Pertinent labs & imaging results that were available during my care of the patient were reviewed by me and considered in my medical decision  making (see chart for details).    47 yo femal presents with one day of left sided abdominal pain. Patient is afebrile, non-toxic appearing, sitting comfortably on examination table. Patient's pain and other symptoms adequately managed in emergency department. Fluid bolus given. Labs, imaging and vitals reviewed.  Patient does not meet the SIRS or Sepsis criteria.  On repeat exam patient does not have a surgical abdomen and there are no peritoneal signs.  CT abdomen pelvis negative for acute abnormalities.  Patient's microscopic urinalysis shows bacteria, this was sent for culture.  Patient denying urinary symptoms including dysuria and increased frequency, patient be contacted by [facility] for positive urine culture requiring antibiotics.  Patient tolerating p.o. fluids here in department without difficulty/vomiting.  Patient with creatinine of 1.16, fluid bolus given in department and patient encouraged to increase water intake at home.  I have encouraged patient to follow-up with her primary care provider regarding her abdominal pain today.  I have prescribed the patient Bentyl and Zofran for her nausea and abdominal pain.  I have informed her not to drive while taking Bentyl because it can make her sleepy.  She states she has taken both these medications in the past and has tolerated them well.  Additionally patient presenting for recurrent right breast inflammation.  Patient states she was treated in May for similar symptoms that partially resolved after treatment of doxycycline.  She states that the right breast has been slightly tender and swollen for the past few months but has worsened over the past few weeks.  CT of the abdomen and pelvis did reveal skin thickening of the right breast.  Physical examination today is concerning for cellulitis of the right breast versus inflammatory breast cancer.  I have informed the patient of my concerns and have encouraged her to follow-up with her OB/GYN or the breast  center as soon as possible for further evaluation of her right breast.  I have prescribed the patient doxycycline for possible infection however I believe that follow-up with OB/GYN for further evaluation regarding  inflammatory breast cancer is of great importance.  Patient is afebrile, no acute distress at this time, states understanding of work-up today and importance of prompt OB/GYN or breast clinic follow-up.  At this time there does not appear to be any evidence of an acute emergency medical condition and the patient appears stable for discharge with appropriate outpatient follow up. Diagnosis was discussed with patient who verbalizes understanding of care plan and is agreeable to discharge. I have discussed return precautions with patient who verbalizes understanding of return precautions. Patient strongly encouraged to follow-up with their PCP. All questions answered.  Patient's case discussed with Dr. Darl Householder who agrees with plan to discharge with follow-up.     Note: Portions of this report may have been transcribed using voice recognition software. Every effort was made to ensure accuracy; however, inadvertent computerized transcription errors may still be present.    Final Clinical Impressions(s) / ED Diagnoses   Final diagnoses:  Generalized abdominal pain  Breast inflammation    ED Discharge Orders        Ordered    doxycycline (VIBRAMYCIN) 100 MG capsule  2 times daily     06/18/18 0052    dicyclomine (BENTYL) 10 MG capsule  2 times daily     06/18/18 0052    ondansetron (ZOFRAN ODT) 4 MG disintegrating tablet  Every 8 hours PRN     06/18/18 0052       Deliah Boston, PA-C 06/18/18 0131    Drenda Freeze, MD 06/18/18 1407

## 2018-06-19 ENCOUNTER — Other Ambulatory Visit: Payer: Self-pay | Admitting: Obstetrics and Gynecology

## 2018-06-19 LAB — URINE CULTURE

## 2018-06-20 ENCOUNTER — Other Ambulatory Visit: Payer: Self-pay

## 2018-06-20 ENCOUNTER — Emergency Department (HOSPITAL_BASED_OUTPATIENT_CLINIC_OR_DEPARTMENT_OTHER)
Admission: EM | Admit: 2018-06-20 | Discharge: 2018-06-20 | Disposition: A | Discharge status: Home / Self Care | Payer: BLUE CROSS/BLUE SHIELD | Attending: Emergency Medicine | Admitting: Emergency Medicine

## 2018-06-20 ENCOUNTER — Encounter (HOSPITAL_BASED_OUTPATIENT_CLINIC_OR_DEPARTMENT_OTHER): Payer: Self-pay | Admitting: *Deleted

## 2018-06-20 DIAGNOSIS — R600 Localized edema: Secondary | ICD-10-CM | POA: Diagnosis present

## 2018-06-20 DIAGNOSIS — I10 Essential (primary) hypertension: Secondary | ICD-10-CM | POA: Diagnosis not present

## 2018-06-20 DIAGNOSIS — Z79899 Other long term (current) drug therapy: Secondary | ICD-10-CM | POA: Insufficient documentation

## 2018-06-20 DIAGNOSIS — R220 Localized swelling, mass and lump, head: Secondary | ICD-10-CM

## 2018-06-20 DIAGNOSIS — N63 Unspecified lump in unspecified breast: Secondary | ICD-10-CM

## 2018-06-20 MED ORDER — PREDNISONE 10 MG PO TABS: 10.0000 mg | ORAL_TABLET | Freq: Every day | ORAL | 0 refills | Status: AC

## 2018-06-20 NOTE — ED Provider Notes (Signed)
[facility] DEPARTMENT Provider Note  CSN: [id number] Arrival date & time: 06/20/18  1911  History   Chief Complaint Chief Complaint  Patient presents with  . Facial Swelling    HPI Desiree Dunn is a 47 y.o. female with a medical history of GERD, HTN, migraine and OSA who presented to the ED for unilateral facial swelling x6 hours. She reports swelling of her left face and lip after eating. She also reports starting a new medication, Bentyl, today. Denies itching, skin rashes/lesions, tongue swelling, dyspnea or dysphagia.  Patient also expresses concern over breast changes which have been present since 05/2018. She endorses breast redness, tenderness and induration. She states that she was treated for abscess, but never underwent an I&D. Patient states she is supposed to have follow-up breast imaging in a couple of weeks. Denies weight loss, fatigue, chest pain or SOB.  Past Medical History:  Diagnosis Date  . Allergy   . Chronic RLQ pain    since 1995 when she had ectopic preg  . Diverticulitis   . Ectopic pregnancy 1995    x 1 , R side   . GERD (gastroesophageal reflux disease)   . HTN (hypertension) 05/24/2015  . Migraine without aura and without status migrainosus, not intractable   . Miscarriage    x 1  . Obesity, unspecified 07/06/2010  . Pelvic congestion    h/o   . Sleep apnea    uses cpap    Patient Active Problem List   Diagnosis Date Noted  . PCP NOTES >>> 08/25/2015  . HTN (hypertension) 05/24/2015  . Episodic tension-type headache, not intractable 02/11/2015  . Migraine without aura and without status migrainosus, not intractable 06/02/2014  . GERD (gastroesophageal reflux disease) 12/01/2012  . CTS (carpal tunnel syndrome) 12/01/2012  . Dermatitis 12/01/2012  . Annual physical exam 03/19/2012  . HEADACHE 12/25/2010  . Obesity 07/06/2010  . Abdominal pain, chronic, right lower quadrant 06/09/2009  . Nausea alone 04/27/2009     Past Surgical History:  Procedure Laterality Date  . ABDOMINAL HYSTERECTOMY  12/2009   [facility]  . BREAST REDUCTION SURGERY  12/09  . COLONOSCOPY  2001   X3 ; diverticulosis  . FOOT SURGERY    . REDUCTION MAMMAPLASTY Bilateral   . RIGHT OOPHORECTOMY  12/2009   [facility]  . TUBAL LIGATION  2000     OB History   None      Home Medications    Prior to Admission medications   Medication Sig Start Date End Date Taking? Authorizing Provider  azelastine (ASTELIN) 0.1 % nasal spray As needed at bedtime. 11/27/16   [provider]  cyclobenzaprine (FLEXERIL) 10 MG tablet Take 1 tablet (10 mg total) by mouth at bedtime as needed for muscle spasms. 08/09/17   Colon Branch, MD  dicyclomine (BENTYL) 10 MG capsule Take 1 capsule (10 mg total) by mouth 2 (two) times daily. 06/18/18   Nuala Alpha A, PA-C  doxycycline (VIBRAMYCIN) 100 MG capsule Take 1 capsule (100 mg total) by mouth 2 (two) times daily for 7 days. 06/18/18 06/25/18  Nuala Alpha A, PA-C  fexofenadine (ALLEGRA) 180 MG tablet Take 1 tablet (180 mg total) by mouth daily. 10/02/17   Colon Branch, MD  fluticasone [facility]) 50 MCG/ACT nasal spray Place 2 sprays into both nostrils daily. 01/29/17   Saguier, Percell Miller, PA-C  ketorolac (TORADOL) 10 MG tablet Take 1 tablet (10 mg total) by mouth every 6 (six) hours as needed. 08/09/17  Paz, Jacqulyn Bath E, MD  lisinopril (PRINIVIL,ZESTRIL) 10 MG tablet Take 1 tablet by mouth daily. 07/30/17   [provider]  Multiple Vitamin (MULTIVITAMIN WITH MINERALS) TABS Take 1 tablet by mouth daily. Reported on 12/06/2015    [provider]  omeprazole (PRILOSEC) 20 MG capsule Take 1 capsule (20 mg total) by mouth daily. 12/01/12   Colon Branch, MD  ondansetron (ZOFRAN ODT) 4 MG disintegrating tablet Take 1 tablet (4 mg total) by mouth every 8 (eight) hours as needed for nausea or vomiting. 06/18/18   Nuala Alpha A, PA-C  predniSONE (DELTASONE) 10 MG tablet Take 1 tablet (10 mg  total) by mouth daily for 5 days. 06/20/18 06/25/18  Tekoa Amon, Alvie Heidelberg I, PA-C  traMADol (ULTRAM) 50 MG tablet Take one tablet every 8 hours prn foot pain. 02/25/18   Trula Slade, DPM  verapamil (CALAN-SR) 120 MG CR tablet Take 1 tablet by mouth daily. 07/30/17   [provider]    Family History Family History  Problem Relation Age of Onset  . Colon cancer Mother 69  . Colon cancer Maternal Aunt 45  . Stroke Father   . Diabetes Father   . Hypertension Father   . Hyperlipidemia Father   . Heart disease Father   . Hypertension Sister   . Breast cancer Other        GM  . Colon polyps Brother   . Colitis Brother   . Heart attack Neg Hx     Social History Social History   Tobacco Use  . Smoking status: Never Smoker  . Smokeless tobacco: Never Used  Substance Use Topics  . Alcohol use: Yes    Alcohol/week: 0.0 standard drinks    Comment: socially - 1 drink every 3 months  . Drug use: No     Allergies   Amoxicillin   Review of Systems Review of Systems  Constitutional: Negative for chills and fever.  HENT: Positive for facial swelling. Negative for congestion, dental problem, ear pain, sore throat and trouble swallowing.   Eyes: Negative for visual disturbance.  Respiratory: Negative for cough, choking, chest tightness and shortness of breath.   Cardiovascular: Positive for chest pain. Negative for palpitations and leg swelling.  Gastrointestinal: Negative.   Genitourinary:       Breast mass  Skin: Positive for color change.  Neurological: Negative.   Hematological: Negative.      Physical Exam Updated Vital Signs BP (!) 145/89   Pulse 91   Temp 98.2 F (36.8 C)   Resp 18   Ht 5\' 4"  (1.626 m)   Wt 102.9 kg   SpO2 98%   BMI 38.94 kg/m   Physical Exam  Constitutional: She appears well-developed and well-nourished. She is cooperative. No distress.  HENT:  Head: Normocephalic and atraumatic.  Right Ear: Tympanic membrane, external ear and ear  canal normal.  Left Ear: Tympanic membrane, external ear and ear canal normal.  Nose: Nose normal. Right sinus exhibits no maxillary sinus tenderness and no frontal sinus tenderness. Left sinus exhibits no maxillary sinus tenderness and no frontal sinus tenderness.  Mouth/Throat: Uvula is midline, oropharynx is clear and moist and mucous membranes are normal. No oral lesions. No trismus in the jaw.  Appreciable exterior swelling of left cheek and lower lip. Facial tissue is soft, non-erythematous and non-tender. No areas of induration or fluctuance. Oral and buccal mucosa is normal.  Eyes: Pupils are equal, round, and reactive to light. Conjunctivae, EOM and lids are normal.  Neck: Normal range of motion and full passive range of motion without pain. Neck supple.  Cardiovascular: Normal rate, regular rhythm and normal heart sounds.  Pulmonary/Chest: Effort normal and breath sounds normal. Right breast exhibits inverted nipple, mass, skin change and tenderness. There is breast swelling.  Right breast is swollen and erythematous especially in lower quadrants. Nipple is inverted and induration is palpated on RLQ of breast. ~2cm area of fluctuance at 9 o'clock position.  Neurological: She is alert.  Skin: There is erythema.  Nursing note and vitals reviewed.    ED Treatments / Results  Labs (all labs ordered are listed, but only abnormal results are displayed) Labs Reviewed - No data to display  EKG None  Radiology No results found.  Procedures Procedures (including critical care time)  Medications Ordered in ED Medications - No data to display   Initial Impression / Assessment and Plan / ED Course  Triage vital signs and the nursing notes have been reviewed.  Pertinent labs & imaging results that were available during care of the patient were reviewed and considered in medical decision making (see chart for details).  Patient presents for acute onset facial swelling after new  exposure which is consistent with an allergic reaction. There are no accompanying complaints that would suggest anaphylaxis such as SOB, chest tightness, throat or tongue swelling. There are no accompanying rashes. Patient also does not have any dental pain, eye complaints or overlying derm changes that would suggest an infectious etiology.  Patient also very worried about breast complaints. Per latest breast imaging on 05/21/18, it states that previous breast abscess has resolved, but there was an ill-defined mass seen. Patient has had no acute changes in her breast since issues first began in 05/2018. There are no constitutional symptoms that warrant further work-up today. Patient scheduled to speak with her PCP on Monday regarding scheduling for additional breast imaging.    Final Clinical Impressions(s) / ED Diagnoses  1. Facial Swelling. Likely allergic reaction. Prednisone 10mg  x5 days prescribed to assist with swelling. 2. Breast Swelling. Concern for malignancy vs abscess given appearance today. Followed closely by her PCP for this and there have been no acute changes or consitutional complaints that warrant further work-up today. Next imaging scheduled for 07/02/18.  Dispo: Home. After thorough clinical evaluation, this patient is determined to be medically stable and can be safely discharged with the previously mentioned treatment and/or outpatient follow-up/referral(s). At this time, there are no other apparent medical conditions that require further screening, evaluation or treatment.   Final diagnoses:  Left facial swelling  Breast swelling    ED Discharge Orders         Ordered    predniSONE (DELTASONE) 10 MG tablet  Daily     06/20/18 2108            Junita Push 06/20/18 2320    Blanchie Dessert, MD 06/20/18 2349

## 2018-06-20 NOTE — ED Notes (Signed)
Pt. R breast tissue is red and edematous around the nipple with pain.  Pt. Reports she has been seen and treated for this with no relief.  Pt. Has hard areas around the aureolus with redness.

## 2018-06-20 NOTE — ED Triage Notes (Signed)
Swelling in the left side of her face today. She denies dental pain.

## 2018-06-20 NOTE — ED Notes (Signed)
Pt. L side Face is edematous not to include the inner oral space when assessed.

## 2018-06-20 NOTE — Discharge Instructions (Addendum)
I think your facial swelling is due to an allergic reaction. I have prescribed you Prednisone to take for the next 5 days to help with the swelling. You may also add Benadryl if you have any itchiness.  Please stay diligent about following up with your breast concerns. Continue taking the antibiotics that they prescribed for you.  I will be praying for you as you go through this process.

## 2018-06-30 ENCOUNTER — Ambulatory Visit (INDEPENDENT_AMBULATORY_CARE_PROVIDER_SITE_OTHER): Payer: BLUE CROSS/BLUE SHIELD | Admitting: Neurology

## 2018-06-30 DIAGNOSIS — J3489 Other specified disorders of nose and nasal sinuses: Secondary | ICD-10-CM

## 2018-06-30 DIAGNOSIS — G4733 Obstructive sleep apnea (adult) (pediatric): Secondary | ICD-10-CM

## 2018-06-30 DIAGNOSIS — G4452 New daily persistent headache (NDPH): Secondary | ICD-10-CM

## 2018-06-30 DIAGNOSIS — Z6838 Body mass index (BMI) 38.0-38.9, adult: Secondary | ICD-10-CM

## 2018-06-30 DIAGNOSIS — G43009 Migraine without aura, not intractable, without status migrainosus: Secondary | ICD-10-CM

## 2018-06-30 DIAGNOSIS — I10 Essential (primary) hypertension: Secondary | ICD-10-CM

## 2018-06-30 DIAGNOSIS — E6609 Other obesity due to excess calories: Secondary | ICD-10-CM

## 2018-07-02 ENCOUNTER — Ambulatory Visit
Admission: RE | Admit: 2018-07-02 | Discharge: 2018-07-02 | Disposition: A | Discharge status: Home / Self Care | Payer: BLUE CROSS/BLUE SHIELD | Source: Ambulatory Visit | Attending: Obstetrics and Gynecology | Admitting: Obstetrics and Gynecology

## 2018-07-02 DIAGNOSIS — N611 Abscess of the breast and nipple: Secondary | ICD-10-CM

## 2018-07-04 ENCOUNTER — Telehealth: Payer: Self-pay | Admitting: Neurology

## 2018-07-04 ENCOUNTER — Other Ambulatory Visit: Payer: Self-pay | Admitting: Neurology

## 2018-07-04 DIAGNOSIS — G4733 Obstructive sleep apnea (adult) (pediatric): Secondary | ICD-10-CM

## 2018-07-04 NOTE — Telephone Encounter (Signed)
Called the patient and reviewed the sleep study results with her.  I advised pt that Dr. Brett Fairy reviewed their sleep study results and found that pt moderate sleep apnea. Dr. Brett Fairy recommends that pt starts a auto CPAP 5-16 cm water pressure. I reviewed PAP compliance expectations with the pt. Pt is agreeable to starting a CPAP. I advised pt that an order will be sent to a DME, [facility], and [facility] will call the pt within about one week after they file with the pt's insurance. [facility] will show the pt how to use the machine, fit for masks, and troubleshoot the CPAP if needed. A follow up appt was made for insurance purposes with Dr. Brett Fairy on Nov 27,2019 at 8:30 am. Pt verbalized understanding to arrive 15 minutes early and bring their CPAP. A letter with all of this information in it will be mailed to the pt as a reminder. I verified with the pt that the address we have on file is correct. Pt verbalized understanding of results. Pt had no questions at this time but was encouraged to call back if questions arise.

## 2018-07-05 NOTE — Procedures (Signed)
PATIENT'S NAME:  Desiree Dunn, Desiree Dunn DOB:      1971/03/02      MRN:    [id number]     DATE OF RECORDING: 06/30/2018 REFERRING M.D.:  Glendale Chard, M.D. Study Performed:  Split-Night Titration Study HISTORY:  Desiree Dunn is a 47 y.o. female patient who had been evaluated for sleep apnea in 2012 (Dr. Erling Cruz). She was diagnosed with OSA and given a CPAP but couldn't get used to it.  She is seen here in a new referral from Dr. Baird Cancer. She had a partial hysterectomy 2-/2011 and reports having still menopausal symptoms at night. She feels her energy level has decreased greatly.  Her husband reports her to snore loudly, and she goes to the bathroom 3-4 times each night, stays asleep for no longer than 2 hours en bloc. She dreams less, rises at 6.30 AM and feels not refreshed nor rested, and she has headaches in AM. Wakes up with a dry mouth, heartburn, and diaphoresis.    Allergies, Chronic RLQ pain, Diverticulitis, GERD, HTN, Migraines, Obesity, Sleep apnea.  The patient endorsed the Epworth Sleepiness Scale at 9/24 points, FSS at 62/63 points.   The patient's weight 227 pounds with a height of 64 (inches), resulting in a BMI of 38.8 kg/m2. The patient's neck circumference measured 17 inches.  CURRENT MEDICATIONS: Astelin, Flexeril, Allegra, Flonase, Toradol, Prinivil, Multivitamin, Prilosec, Deltasone, Ultram, Calan-SR  PROCEDURE:  This is a multichannel digital polysomnogram utilizing the Somnostar 11.2 system.  Electrodes and sensors were applied and monitored per AASM Specifications.   EEG, EOG, Chin and Limb EMG, were sampled at 200 Hz.  ECG, Snore and Nasal Pressure, Thermal Airflow, Respiratory Effort, CPAP Flow and Pressure, Oximetry was sampled at 50 Hz. Digital video and audio were recorded.      BASELINE STUDY WITHOUT CPAP RESULTS:  Lights Out was at 22:15 and Lights On at 05:15.  Total recording time (TRT) was 223.5, with a total sleep time (TST) of 150.5 minutes.  The patient's  sleep latency was 48.5 minutes.  REM latency was 0 minutes.  The sleep efficiency was 67.3 %. SLEEP ARCHITECTURE: WASO (Wake after sleep onset) was 25 minutes, Stage N1 was 6 minutes, Stage N2 was 108 minutes, Stage N3 was 36.5 minutes and Stage R (REM sleep) was 0 minutes.  The percentages were Stage N1 4.%, Stage N2 71.8%, Stage N3 24.3% and Stage R (REM sleep) 0%.   RESPIRATORY ANALYSIS:  There were a total of 52 respiratory events:  0 obstructive apneas, 0 central apneas and 0 mixed apneas with a total of 0 apneas and an apnea index (AI) of 0. There were 52 hypopneas with a hypopnea index of 20.7. The patient also had 0 respiratory event related arousals (RERAs).  The total APNEA/HYPOPNEA INDEX (AHI) was 20.7 /hour and the total RESPIRATORY DISTURBANCE INDEX was 20.7 /hour.  No REM sleep was observed and all 104 events were seen in NREM, with a non-REM AHI of 20.7 /hour. The patient spent 116.5 minutes sleep time in the supine and 201 minutes in non-supine sleep position. The supine AHI was 29.6 /hour versus a non-supine AHI of 17.5 /hour.  OXYGEN SATURATION & C02:  The wake baseline 02 saturation was 98%, with the lowest being 84%. Time spent below 89% saturation equaled 3 minutes.  Average End Tidal CO2 during sleep was 34 torr, peak End Tidal CO2 during NREM sleep was 40 torr.  AROUSALS: The patient had a total of 0 Periodic Limb Movements.  The arousals  were noted as: 18 were spontaneous, 0 were associated with PLMs, and 7 were associated with respiratory events. Audio and video analysis did not show any abnormal or unusual movements, behaviors, phonations or vocalizations. Sleep was very fragmented. The patient took one bathroom break. Snoring was noted EKG was in keeping with normal sinus rhythm (NSR).  TITRATION STUDY WITH CPAP RESULTS: CPAP was initiated at 5 cmH20 with heated humidity per AASM split night standards, a Simplus small sized FFM was given to the patient based on the  technician's assessment of the patient "being a mouth breather". Pressure was advanced to 14 cmH20 because of hypopneas, apneas and desaturations.  At a PAP pressure of 14 cmH20, there was a reduction of the AHI to 0.0 /hour, but the observed sleep time at that pressure was only 12 minutes.   Total recording time (TRT) was 197 minutes, with a total sleep time (TST) of 166.5 minutes. The patient's sleep latency was 4.0 minutes. REM was not recorded.  The sleep efficiency was 84.5 %.    SLEEP ARCHITECTURE: Wake after sleep was 20.5 minutes, Stage N1 9 minutes, Stage N2 157.5 minutes, Stage N3 0 minutes and Stage R (REM sleep) 0 minutes. The percentages were: Stage N1 5.4%, Stage N2 94.6%, Stage N3 0% and Stage R (REM sleep) 0%.   RESPIRATORY ANALYSIS:  There were a total of 55 respiratory events: 3 obstructive apneas, 10 central apneas and 0 mixed apneas with a total of 13 apneas and 42 hypopneas. The patient also had respiratory event related arousals (RERAs).      The total APNEA/HYPOPNEA INDEX (AHI) was 19.8 /hour and the total RESPIRATORY DISTURBANCE INDEX was 19.8 /hour.  0 events occurred in REM sleep and 55 events in NREM. The REM AHI was 0 /hour versus a non-REM AHI of 19.8 /hour. The patient spent 46% of total sleep time in the supine position. The supine AHI was 22.9 /hour, versus a non-supine AHI of 17.2/hour.  OXYGEN SATURATION & C02:  The wake baseline 02 saturation was 94%, with the lowest being 78%. Time spent below 89% saturation equaled 4 minutes.  AROUSALS:   The patient had a total of 21 Periodic Limb Movements. The Periodic Limb Movement (PLM) index was 7.6 /hour and the PLM Arousal index was 0.7 /hour. The arousals were noted as: 16 were spontaneous, 2 were associated with PLMs, and 11 were associated with respiratory events.  POLYSOMNOGRAPHY IMPRESSION :   1. Moderate Obstructive Sleep Apnea (OSA) at AHI 20.7, in supine AHI was 29/h.  2. Primary Snoring 3. Dysfunctions  associated with sleep stages or arousals from sleep- very fragmented sleep and REM void- likely due to medication. 4. Restless Sleep - Periodic Limb Movement Disorder (PLMD).   RECOMMENDATIONS: 1) OSA did not respond to lower pressure on CPAP therapy, and the CPAP titration was incomplete as central apneas emerged and desaturations continued under much of the explored pressure settings. 2) Auto-titration CPAP between 5-16 cm water pressure with 3 cm EPR is recommended. 3) Medically supported and supervised weight loss is recommended. 4) OSA was supine sleep accentuated and the patient should be fitted with a mask that supports sleeping on her side or even prone.  5) Limb movements followed some arousals, few PLMs caused arousals. PLMD is not a primary sleep problem.     Post-study, the patient indicated that sleep in the test situation was similar to home.  The patient was given a Simplus, FFM, in small size. I found no evidence  that other interfaces had been tried.  A follow up appointment will be scheduled in the Sleep Clinic at [facility].      I certify that I have reviewed the entire raw data recording prior to the issuance of this report in accordance with the Standards of Accreditation of the American Academy of Sleep Medicine (AASM)     Larey Seat, M.D.  07-03-2018  Diplomat, American Board of Psychiatry and Neurology  Diplomat, [place] [employer] [facility] Medical Director, [place] [facility]

## 2018-07-10 ENCOUNTER — Encounter: Payer: Self-pay | Admitting: Neurology

## 2018-07-11 NOTE — Telephone Encounter (Signed)
Called patient at work this AM , Has had regular colonoscopies since age 47- I asked her to speak to Dr Baird Cancer for a referral= she has seen Dr..F. Byerly- abdominal surgeon in [facility], in regards to weight loss surgery , and her internist and surgeon can work together. She understood and agreed. Desiree Dunn.

## 2018-07-16 DIAGNOSIS — Z6839 Body mass index (BMI) 39.0-39.9, adult: Secondary | ICD-10-CM

## 2018-07-16 DIAGNOSIS — J342 Deviated nasal septum: Secondary | ICD-10-CM | POA: Diagnosis not present

## 2018-07-16 DIAGNOSIS — I10 Essential (primary) hypertension: Secondary | ICD-10-CM

## 2018-07-16 DIAGNOSIS — G4733 Obstructive sleep apnea (adult) (pediatric): Secondary | ICD-10-CM | POA: Diagnosis not present

## 2018-08-12 ENCOUNTER — Other Ambulatory Visit: Payer: Self-pay

## 2018-08-12 MED ORDER — VERAPAMIL HCL ER 120 MG PO TBCR: 120.0000 mg | EXTENDED_RELEASE_TABLET | Freq: Every day | ORAL | 0 refills | Status: DC

## 2018-08-12 MED ORDER — LISINOPRIL 10 MG PO TABS: 10.0000 mg | ORAL_TABLET | Freq: Every day | ORAL | 0 refills | Status: DC

## 2018-08-12 NOTE — Telephone Encounter (Signed)
Left the pt a message that her Lisinopril and Verapamil refills has been faxed to [facility].

## 2018-08-14 ENCOUNTER — Encounter (INDEPENDENT_AMBULATORY_CARE_PROVIDER_SITE_OTHER): Payer: BLUE CROSS/BLUE SHIELD

## 2018-08-27 ENCOUNTER — Encounter (INDEPENDENT_AMBULATORY_CARE_PROVIDER_SITE_OTHER): Payer: Self-pay | Admitting: Bariatrics

## 2018-08-27 ENCOUNTER — Ambulatory Visit (INDEPENDENT_AMBULATORY_CARE_PROVIDER_SITE_OTHER): Payer: BLUE CROSS/BLUE SHIELD | Admitting: Bariatrics

## 2018-08-27 VITALS — BP 122/59 | HR 66 | Temp 97.3°F | Ht 65.0 in | Wt 224.0 lb

## 2018-08-27 DIAGNOSIS — Z9189 Other specified personal risk factors, not elsewhere classified: Secondary | ICD-10-CM

## 2018-08-27 DIAGNOSIS — G4733 Obstructive sleep apnea (adult) (pediatric): Secondary | ICD-10-CM

## 2018-08-27 DIAGNOSIS — I10 Essential (primary) hypertension: Secondary | ICD-10-CM

## 2018-08-27 DIAGNOSIS — E559 Vitamin D deficiency, unspecified: Secondary | ICD-10-CM

## 2018-08-27 DIAGNOSIS — R5383 Other fatigue: Secondary | ICD-10-CM | POA: Diagnosis not present

## 2018-08-27 DIAGNOSIS — Z1331 Encounter for screening for depression: Secondary | ICD-10-CM

## 2018-08-27 DIAGNOSIS — R0602 Shortness of breath: Secondary | ICD-10-CM | POA: Diagnosis not present

## 2018-08-27 DIAGNOSIS — Z6837 Body mass index (BMI) 37.0-37.9, adult: Secondary | ICD-10-CM

## 2018-08-27 DIAGNOSIS — Z0289 Encounter for other administrative examinations: Secondary | ICD-10-CM

## 2018-08-28 LAB — VITAMIN B12: Vitamin B-12: 327 pg/mL (ref 232–1245)

## 2018-08-28 LAB — HEMOGLOBIN A1C
Est. average glucose Bld gHb Est-mCnc: 111 mg/dL
HEMOGLOBIN A1C: 5.5 % (ref 4.8–5.6)

## 2018-08-28 LAB — T3: T3 TOTAL: 115 ng/dL (ref 71–180)

## 2018-08-28 LAB — LIPID PANEL WITH LDL/HDL RATIO
CHOLESTEROL TOTAL: 205 mg/dL — AB (ref 100–199)
HDL: 68 mg/dL (ref >39)
LDL Calculated: 116 mg/dL — ABNORMAL HIGH (ref 0–99)
LDL/HDL RATIO: 1.7 ratio (ref 0.0–3.2)
TRIGLYCERIDES: 105 mg/dL (ref 0–149)
VLDL Cholesterol Cal: 21 mg/dL (ref 5–40)

## 2018-08-28 LAB — T4, FREE: FREE T4: 1.11 ng/dL (ref 0.82–1.77)

## 2018-08-28 LAB — VITAMIN D 25 HYDROXY (VIT D DEFICIENCY, FRACTURES): Vit D, 25-Hydroxy: 22.5 ng/mL — ABNORMAL LOW (ref 30.0–100.0)

## 2018-08-28 LAB — TSH: TSH: 1.14 u[IU]/mL (ref 0.450–4.500)

## 2018-08-28 LAB — INSULIN, RANDOM: INSULIN: 18 u[IU]/mL (ref 2.6–24.9)

## 2018-09-01 DIAGNOSIS — G4733 Obstructive sleep apnea (adult) (pediatric): Secondary | ICD-10-CM | POA: Insufficient documentation

## 2018-09-01 NOTE — Progress Notes (Signed)
Office: [phone]  /  Fax: [phone]   Dear Dr. Garwin Brothers,   Thank you for referring Desiree Dunn to our clinic. The following note includes my evaluation and treatment recommendations.  HPI:   Chief Complaint: OBESITY    [name] has been referred by Sheronette A. Garwin Brothers, MD for consultation regarding her obesity and obesity related comorbidities.    Desiree Dunn (MR# [id number]) is a 47 y.o. female who presents on 09/01/2018 for obesity evaluation and treatment. Current BMI is Body mass index is 37.28 kg/m.[name] Desiree Dunn has been struggling with her weight for many years and has been unsuccessful in either losing weight, maintaining weight loss, or reaching her healthy weight goal.     Etheline attended our information session and states she is currently in the action stage of change and ready to dedicate time achieving and maintaining a healthier weight. [name] is interested in becoming our patient and working on intensive lifestyle modifications including (but not limited to) diet, exercise and weight loss.    [name] states her family eats meals together she thinks her family will eat healthier with her her desired weight loss is 46 lbs she started gaining weight in 2011 after partial hysterectomy her heaviest weight ever was 226 lbs. she has significant food cravings issues  she snacks frequently in the evenings She is a "mindless eater" she frequently makes poor food choices she frequently eats larger portions than normal  she struggles with emotional eating    Fatigue Desiree Dunn feels her energy is lower than it should be. This has worsened with weight gain and has not worsened recently. [name] admits to daytime somnolence and admits to waking up still tired. Patient has a history of obstructive sleep apnea, which may contribute to her fatigue. Patent has a history of symptoms of daytime fatigue, morning fatigue, morning headache and hypertension. Patient  generally gets 6 or 7 hours of sleep per night, and states she is doing much better with sleep, now that she uses CPAP. Snoring is present. Apneic episodes are present. Epworth Sleepiness Score is 8  Dyspnea on exertion [name] notes increasing shortness of breath with exercising and seems to be worsening over time with weight gain. She notes getting out of breath sooner with activity than she used to. This has not gotten worse recently. Desiree Dunn denies orthopnea.  Hypertension Desiree Dunn is a 47 y.o. female with hypertension. She is currently taking Verapamil and Lisinopril  [name] denies lightheadedness. She is working weight loss to help control her blood pressure with the goal of decreasing her risk of heart attack and stroke. [name] blood pressure is well controlled.  Vitamin D deficiency Desiree Dunn has a diagnosis of vitamin D deficiency. She is currently taking multi vitamin and she has vit D and has taken high dose vit D in the past. Andreia denies nausea, vomiting or muscle weakness.  At risk for osteopenia and osteoporosis Desiree Dunn is at higher risk of osteopenia and osteoporosis due to vitamin D deficiency.   Sleep Apnea Desiree Dunn has a history of sleep apnea. Desiree Dunn has been wearing her CPAP nightly and she feels more rested.  Depression Screen [name]'s Food and Mood (modified PHQ-9) score was  Depression screen PHQ 2/9 08/27/2018  Decreased Interest 3  Down, Depressed, Hopeless 2  PHQ - 2 Score 5  Altered sleeping 3  Tired, decreased energy 3  Change in appetite 1  Feeling bad or failure about yourself  1  Trouble concentrating 1  Moving slowly or fidgety/restless 0  Suicidal thoughts 0  PHQ-9 Score 14    ALLERGIES: Allergies  Allergen Reactions  . Amoxicillin     Hives    MEDICATIONS: Current Outpatient Medications on File Prior to Visit  Medication Sig Dispense Refill  . azelastine (ASTELIN) 0.1 % nasal spray As needed at bedtime.    . famotidine  (PEPCID) 20 MG tablet Take 20 mg by mouth 2 (two) times daily.    . fluticasone (FLONASE) 50 MCG/ACT nasal spray Place 2 sprays into both nostrils daily. 16 g 1  . lisinopril (PRINIVIL,ZESTRIL) 10 MG tablet Take 1 tablet (10 mg total) by mouth daily. 90 tablet 0  . Multiple Vitamin (MULTIVITAMIN WITH MINERALS) TABS Take 1 tablet by mouth daily. Reported on 12/06/2015    . omeprazole (PRILOSEC) 20 MG capsule Take 1 capsule (20 mg total) by mouth daily. 30 capsule 6  . traMADol (ULTRAM) 50 MG tablet Take one tablet every 8 hours prn foot pain. 30 tablet 0  . verapamil (CALAN-SR) 120 MG CR tablet Take 1 tablet (120 mg total) by mouth daily. 90 tablet 0   No current facility-administered medications on file prior to visit.     PAST MEDICAL HISTORY: Past Medical History:  Diagnosis Date  . Allergy   . Back pain   . Chronic RLQ pain    since 1995 when she had ectopic preg  . Constipation   . Deviated septum   . Diverticulitis   . Ectopic pregnancy 1995    x 1 , R side   . GERD (gastroesophageal reflux disease)   . HTN (hypertension) 05/24/2015  . Migraine without aura and without status migrainosus, not intractable   . Miscarriage    x 1  . Obesity, unspecified 07/06/2010  . Pelvic congestion    h/o   . Sleep apnea    uses cpap    PAST SURGICAL HISTORY: Past Surgical History:  Procedure Laterality Date  . ABDOMINAL HYSTERECTOMY  12/2009   [facility]  . BREAST REDUCTION SURGERY  12/09  . COLONOSCOPY  2001   X3 ; diverticulosis  . FOOT SURGERY    . PARTIAL HYSTERECTOMY    . REDUCTION MAMMAPLASTY Bilateral   . RIGHT OOPHORECTOMY  12/2009   [facility]  . TUBAL LIGATION  2000    SOCIAL HISTORY: Social History   Tobacco Use  . Smoking status: Never Smoker  . Smokeless tobacco: Never Used  Substance Use Topics  . Alcohol use: Yes    Alcohol/week: 0.0 standard drinks    Comment: socially - 1 drink every 3 months  . Drug use: No    FAMILY HISTORY: Family History  Problem  Relation Age of Onset  . Colon cancer Mother 75  . Colon cancer Maternal Aunt 56  . Stroke Father   . Diabetes Father   . Hypertension Father   . Hyperlipidemia Father   . Heart disease Father   . Obesity Father   . Hypertension Sister   . Breast cancer Other        GM  . Colon polyps Brother   . Colitis Brother   . Heart attack Neg Hx     ROS: Review of Systems  Constitutional: Positive for malaise/fatigue.  HENT: Positive for congestion (nasal stuffiness) and sinus pain.        + Dentures + Hoarseness  Eyes:       + Wear Glasses or Contacts  Respiratory: Positive for cough and shortness of breath (on exertion).   Cardiovascular: Negative  for orthopnea.       + Sudden Awakening from Sleep with Shortness of Breath  Gastrointestinal: Positive for constipation. Negative for nausea and vomiting.  Genitourinary: Positive for frequency.  Musculoskeletal: Positive for back pain.       Negative for muscle weakness  Skin: Positive for itching and rash.       + Dryness   Neurological: Positive for headaches.       Negative for lightheadedness  Endo/Heme/Allergies: Bruises/bleeds easily (bruising).  Psychiatric/Behavioral:       +Stress     PHYSICAL EXAM: Blood pressure (!) 122/59, pulse 66, temperature (!) 97.3 F (36.3 C), temperature source Oral, height 5\' 5"  (1.651 m), weight 224 lb (101.6 kg), SpO2 98 %. Body mass index is 37.28 kg/m. Physical Exam  Constitutional: She is oriented to person, place, and time. She appears well-developed and well-nourished.  HENT:  Head: Normocephalic and atraumatic.  Nose: Nose normal.  Mallanpati = 3  Eyes: EOM are normal. No scleral icterus.  Neck: Normal range of motion. Neck supple. No thyromegaly present.  Cardiovascular: Normal rate and regular rhythm.  Pulmonary/Chest: No respiratory distress.  Abdominal: Soft. There is no tenderness.  + Obesity  Musculoskeletal: Normal range of motion.  Range of Motion normal in all 4  extremities   Neurological: She is alert and oriented to person, place, and time. Coordination normal.  Skin: Skin is warm and dry.  Psychiatric: She has a normal mood and affect.  Vitals reviewed.   RECENT LABS AND TESTS: BMET    Component Value Date/Time   NA 137 06/17/2018 2110   K 3.4 (L) 06/17/2018 2110   CL 100 06/17/2018 2110   CO2 25 06/17/2018 2110   GLUCOSE 97 06/17/2018 2110   BUN 10 06/17/2018 2110   CREATININE 1.16 (H) 06/17/2018 2110   CALCIUM 9.1 06/17/2018 2110   GFRNONAA 55 (L) 06/17/2018 2110   GFRAA >60 06/17/2018 2110   Lab Results  Component Value Date   HGBA1C 5.5 08/27/2018   Lab Results  Component Value Date   INSULIN 18.0 08/27/2018   CBC    Component Value Date/Time   WBC 8.7 06/17/2018 2110   RBC 4.40 06/17/2018 2110   HGB 13.4 06/17/2018 2110   HCT 39.4 06/17/2018 2110   PLT 218 06/17/2018 2110   MCV 89.5 06/17/2018 2110   MCH 30.5 06/17/2018 2110   MCHC 34.0 06/17/2018 2110   RDW 13.4 06/17/2018 2110   LYMPHSABS 2.9 06/17/2018 2110   MONOABS 0.8 06/17/2018 2110   EOSABS 0.1 06/17/2018 2110   BASOSABS 0.0 06/17/2018 2110   Iron/TIBC/Ferritin/ %Sat No results found for: IRON, TIBC, FERRITIN, IRONPCTSAT Lipid Panel     Component Value Date/Time   CHOL 205 (H) 08/27/2018 1124   TRIG 105 08/27/2018 1124   HDL 68 08/27/2018 1124   CHOLHDL 3 01/14/2017 1529   VLDL 37.2 01/14/2017 1529   LDLCALC 116 (H) 08/27/2018 1124   Hepatic Function Panel     Component Value Date/Time   PROT 8.2 (H) 06/17/2018 2110   ALBUMIN 4.4 06/17/2018 2110   AST 33 06/17/2018 2110   ALT 32 06/17/2018 2110   ALKPHOS 68 06/17/2018 2110   BILITOT 0.7 06/17/2018 2110   BILIDIR 0.0 04/23/2013 1442      Component Value Date/Time   TSH 1.140 08/27/2018 1124   TSH 0.87 04/12/2016 1515   TSH 0.66 04/23/2013 1442    ECG  shows NSR with a rate of 67 INDIRECT CALORIMETER done today  shows a VO2 of 353 and a REE of 2456.  Her calculated basal metabolic  rate is 7035 thus her basal metabolic rate is better than expected.    ASSESSMENT AND PLAN: Other fatigue - Plan: EKG 12-Lead, Hemoglobin A1c, Insulin, random, Vitamin B12, T3, T4, free, TSH  Shortness of breath on exertion - Plan: Lipid Panel With LDL/HDL Ratio  Obstructive sleep apnea syndrome  Essential hypertension  Vitamin D deficiency - Plan: VITAMIN D 25 Hydroxy (Vit-D Deficiency, Fractures)  At risk for osteoporosis  Depression screening  Class 2 severe obesity with serious comorbidity and body mass index (BMI) of 37.0 to 37.9 in adult, unspecified obesity type ([place])  PLAN: Fatigue Florella was informed that her fatigue may be related to obesity, depression or many other causes. Labs will be ordered, and in the meanwhile Eathel has agreed to work on diet, exercise and weight loss to help with fatigue. Proper sleep hygiene was discussed including the need for 7-8 hours of quality sleep each night. Khalila will continue to use CPAP nightly.  Dyspnea on exertion Arelys's shortness of breath appears to be obesity related and exercise induced. She has agreed to work on weight loss and gradually increase exercise to treat her exercise induced shortness of breath. If Masey follows our instructions and loses weight without improvement of her shortness of breath, we will plan to refer to pulmonology. We will monitor this condition regularly. Simren agrees to this plan.  Hypertension We discussed sodium restriction, working on healthy weight loss, and a regular exercise program as the means to achieve improved blood pressure control. Dempsey agreed with this plan and agreed to follow up as directed. We will continue to monitor her blood pressure as well as her progress with the above lifestyle modifications. She will continue her medications as prescribed and will watch for signs of hypotension as she continues her lifestyle modifications.  Vitamin D Deficiency Willisha was informed that low  vitamin D levels contributes to fatigue and are associated with obesity, breast, and colon cancer. She will continue to take multi vitamin and will follow up for routine testing of vitamin D, at least 2-3 times per year. She was informed of the risk of over-replacement of vitamin D and agrees to not increase her dose unless she discusses this with [place] first. We will check vitamin D level and Ailana will follow up as directed.  At risk for osteopenia and osteoporosis Dayannara was given extended  (15 minutes) osteoporosis prevention counseling today. Zadaya is at risk for osteopenia and osteoporosis due to her vitamin D deficiency. She was encouraged to take her vitamin D and follow her higher calcium diet and increase strengthening exercise to help strengthen her bones and decrease her risk of osteopenia and osteoporosis.  Sleep Apnea Dystany will continue to wear her CPAP nightly. She will follow up with our clinic in 2 weeks.  Depression Screen Addalyn had a moderately positive depression screening. Depression is commonly associated with obesity and often results in emotional eating behaviors. We will monitor this closely and work on CBT to help improve the non-hunger eating patterns. Referral to Psychology may be required if no improvement is seen as she continues in our clinic.  Obesity Ambera is currently in the action stage of change and her goal is to continue with weight loss efforts. I recommend Coralie begin the structured treatment plan as follows:  She has agreed to follow the Category 3 plan Saidah has been instructed to eventually work up to  a goal of 150 minutes of combined cardio and strengthening exercise per week for weight loss and overall health benefits. 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   She was informed of the importance of frequent follow up visits to maximize her success with intensive lifestyle modifications for her multiple health conditions. She was informed we would discuss her lab results at her next visit unless there is a critical issue that needs to be addressed sooner. Merrit agreed to keep her next visit at the agreed upon time to discuss these results.    OBESITY BEHAVIORAL INTERVENTION VISIT  Today's visit was # 1   Starting weight: 224 lbs Starting date: 08/27/18 Today's weight : 224 lbs  Today's date: 08/27/2018 Total lbs lost to date: 0   ASK: We discussed the diagnosis of obesity with Angela Nevin Popiel today and Ivonne agreed to give [place] permission to discuss obesity behavioral modification therapy today.  ASSESS: Leonda has the diagnosis of obesity and her BMI today is 37.28 Shamarie is in the action stage of change   ADVISE: Christl was educated on the multiple health risks of obesity as well as the benefit of weight loss to improve her health. She was advised of the need for long term treatment and the importance of lifestyle modifications to improve her current health and to decrease her risk of future health problems.  AGREE: Multiple dietary modification options and treatment options were discussed and  Kashmere agreed to follow the recommendations documented in the above note.  ARRANGE: Paylin was educated on the importance of frequent visits to treat obesity as outlined per CMS and USPSTF guidelines and agreed to schedule her next follow up appointment today.  Corey Skains, am acting as Location manager for [employer]. [employer]ark  I have reviewed the above documentation for accuracy and completeness, and I agree with the above. -Jearld Lesch, DO

## 2018-09-02 ENCOUNTER — Ambulatory Visit: Payer: BLUE CROSS/BLUE SHIELD | Admitting: Internal Medicine

## 2018-09-02 ENCOUNTER — Encounter (INDEPENDENT_AMBULATORY_CARE_PROVIDER_SITE_OTHER): Payer: Self-pay | Admitting: Bariatrics

## 2018-09-03 ENCOUNTER — Other Ambulatory Visit: Payer: Self-pay | Admitting: Internal Medicine

## 2018-09-08 ENCOUNTER — Other Ambulatory Visit: Payer: Self-pay | Admitting: Otolaryngology

## 2018-09-10 ENCOUNTER — Ambulatory Visit: Payer: BLUE CROSS/BLUE SHIELD | Admitting: Internal Medicine

## 2018-09-10 ENCOUNTER — Ambulatory Visit (INDEPENDENT_AMBULATORY_CARE_PROVIDER_SITE_OTHER): Payer: BLUE CROSS/BLUE SHIELD | Admitting: Bariatrics

## 2018-09-10 ENCOUNTER — Encounter: Payer: Self-pay | Admitting: Internal Medicine

## 2018-09-10 VITALS — BP 110/74 | HR 82 | Temp 98.1°F | Ht 65.0 in | Wt 230.6 lb

## 2018-09-10 DIAGNOSIS — Z23 Encounter for immunization: Secondary | ICD-10-CM

## 2018-09-10 DIAGNOSIS — R21 Rash and other nonspecific skin eruption: Secondary | ICD-10-CM

## 2018-09-10 MED ORDER — CRISABOROLE 2 % EX OINT: 2.5000 g | TOPICAL_OINTMENT | Freq: Every day | CUTANEOUS | Status: DC

## 2018-09-11 ENCOUNTER — Ambulatory Visit (INDEPENDENT_AMBULATORY_CARE_PROVIDER_SITE_OTHER): Payer: BLUE CROSS/BLUE SHIELD | Admitting: Bariatrics

## 2018-09-11 ENCOUNTER — Encounter (INDEPENDENT_AMBULATORY_CARE_PROVIDER_SITE_OTHER): Payer: Self-pay | Admitting: Bariatrics

## 2018-09-11 VITALS — BP 123/83 | HR 89 | Temp 98.3°F | Ht 65.0 in | Wt 222.0 lb

## 2018-09-11 DIAGNOSIS — E88819 Insulin resistance, unspecified: Secondary | ICD-10-CM

## 2018-09-11 DIAGNOSIS — Z6837 Body mass index (BMI) 37.0-37.9, adult: Secondary | ICD-10-CM

## 2018-09-11 DIAGNOSIS — Z9189 Other specified personal risk factors, not elsewhere classified: Secondary | ICD-10-CM

## 2018-09-11 DIAGNOSIS — E66812 Obesity, class 2: Secondary | ICD-10-CM

## 2018-09-11 DIAGNOSIS — E8881 Metabolic syndrome: Secondary | ICD-10-CM | POA: Diagnosis not present

## 2018-09-11 DIAGNOSIS — E559 Vitamin D deficiency, unspecified: Secondary | ICD-10-CM | POA: Diagnosis not present

## 2018-09-11 MED ORDER — VITAMIN D (ERGOCALCIFEROL) 1.25 MG (50000 UNIT) PO CAPS: 50000.0000 [IU] | ORAL_CAPSULE | ORAL | 0 refills | Status: DC

## 2018-09-14 ENCOUNTER — Encounter: Payer: Self-pay | Admitting: Internal Medicine

## 2018-09-14 NOTE — Progress Notes (Signed)
Subjective:     Patient ID: Desiree Dunn , female    DOB: 1971/03/26 , 47 y.o.   MRN: [id number]   Chief Complaint  Patient presents with  . Rash    HPI  Rash  This is a new problem. The current episode started 1 to 4 weeks ago. The problem has been gradually improving since onset. The affected locations include the right hand. The rash is characterized by scaling and dryness. She was exposed to nothing. Past treatments include anti-itch cream. The treatment provided mild relief.     Past Medical History:  Diagnosis Date  . Allergy   . Back pain   . Chronic RLQ pain    since 1995 when she had ectopic preg  . Constipation   . Deviated septum   . Diverticulitis   . Ectopic pregnancy 1995    x 1 , R side   . GERD (gastroesophageal reflux disease)   . HTN (hypertension) 05/24/2015  . Migraine without aura and without status migrainosus, not intractable   . Miscarriage    x 1  . Obesity, unspecified 07/06/2010  . Pelvic congestion    h/o   . Sleep apnea    uses cpap     Family History  Problem Relation Age of Onset  . Colon cancer Mother 4  . Colon cancer Maternal Aunt 61  . Stroke Father   . Diabetes Father   . Hypertension Father   . Hyperlipidemia Father   . Heart disease Father   . Obesity Father   . Hypertension Sister   . Breast cancer Other        GM  . Colon polyps Brother   . Colitis Brother   . Heart attack Neg Hx      Current Outpatient Medications:  .  azelastine (ASTELIN) 0.1 % nasal spray, As needed at bedtime., Disp: , Rfl:  .  famotidine (PEPCID) 20 MG tablet, Take 20 mg by mouth 2 (two) times daily., Disp: , Rfl:  .  fluticasone (FLONASE) 50 MCG/ACT nasal spray, Place 2 sprays into both nostrils daily., Disp: 16 g, Rfl: 1 .  lisinopril (PRINIVIL,ZESTRIL) 10 MG tablet, Take 1 tablet (10 mg total) by mouth daily., Disp: 90 tablet, Rfl: 0 .  Multiple Vitamin (MULTIVITAMIN WITH MINERALS) TABS, Take 1 tablet by mouth daily. Reported on  12/06/2015, Disp: , Rfl:  .  omeprazole (PRILOSEC) 20 MG capsule, Take 1 capsule (20 mg total) by mouth daily., Disp: 30 capsule, Rfl: 6 .  traMADol (ULTRAM) 50 MG tablet, Take one tablet every 8 hours prn foot pain., Disp: 30 tablet, Rfl: 0 .  verapamil (CALAN-SR) 120 MG CR tablet, TAKE 1 TABLET BY MOUTH EVERY DAY, Disp: 90 tablet, Rfl: 1 .  Crisaborole (EUCRISA) 2 % OINT, Apply 2.5 g topically daily., Disp: , Rfl:  .  Vitamin D, Ergocalciferol, (DRISDOL) 50000 units CAPS capsule, Take 1 capsule (50,000 Units total) by mouth every 7 (seven) days., Disp: 4 capsule, Rfl: 0   Allergies  Allergen Reactions  . Amoxicillin     Hives     Review of Systems  Constitutional: Negative.   HENT: Negative.   Respiratory: Negative.   Cardiovascular: Negative.   Gastrointestinal: Negative.   Genitourinary: Negative.   Skin: Positive for rash.  Neurological: Negative.   Psychiatric/Behavioral: Negative.      Today's Vitals   09/10/18 1446  BP: 110/74  Pulse: 82  Temp: 98.1 F (36.7 C)  TempSrc: Oral  Weight: 230 lb  9.6 oz (104.6 kg)  Height: 5\' 5"  (1.651 m)   Body mass index is 38.37 kg/m.   Objective:  Physical Exam  Constitutional: She appears well-developed and well-nourished.  HENT:  Head: Normocephalic and atraumatic.  Eyes: EOM are normal.  Skin: Skin is warm and dry.  FLESH-COLORED MACULOPAPULAR RASH ON DORSUM OF R HAND, NO OVERLYING ERYTHEMA. NO VESICULAR LESIONS NOTED.   Nursing note and vitals reviewed.       Assessment And Plan:     1. Rash  SHE WAS GIVEN RX LOTRISONE CREAM TO APPLY TO AFFECTED AREA TWICE DAILY AS NEEDED. SHE WILL LET ME KNOW IF HER SX PERSIST.  2. Need for vaccination  - Flu Vaccine QUAD 6+ mos PF IM (Fluarix Quad PF)        [name], MD

## 2018-09-15 DIAGNOSIS — E8881 Metabolic syndrome: Secondary | ICD-10-CM | POA: Insufficient documentation

## 2018-09-15 NOTE — Progress Notes (Signed)
Office: [phone]  /  Fax: [phone]   HPI:   Chief Complaint: OBESITY Desiree Dunn is here to discuss her progress with her obesity treatment plan. She is on the Category 2 plan and is following her eating plan approximately 0 % of the time. She states she is exercising 0 minutes 0 times per week. Desiree Dunn has not started the plan due to not going to the grocery store. She states that she will go and buy the appropriate food. She has been following the portion control and smarter choices plan.  Her weight is 222 lb (100.7 kg) today and has had a weight loss of 2 pounds over a period of 2 weeks since her last visit. She has lost 2 lbs since starting treatment with [place].  Vitamin D deficiency Desiree Dunn has a diagnosis of vitamin D deficiency. Her vitamin D level was 22.5 on 08/27/18. She is not currently taking vit D and denies nausea, vomiting, or muscle weakness.  Insulin Resistance Desiree Dunn has a diagnosis of insulin resistance based on her elevated fasting insulin level >5. Although Desiree Dunn's blood glucose readings are still under good control, insulin resistance puts her at greater risk of metabolic syndrome and diabetes. Her A1c was 5.5 and her Insulin was 18.0 on 08/27/18. She is not taking metformin currently and continues to work on diet and exercise to decrease risk of diabetes. She admits to inappropriate hunger.  At risk for diabetes Desiree Dunn is at higher than average risk for developing diabetes due to her insulin resistance and obesity.   ALLERGIES: Allergies  Allergen Reactions  . Amoxicillin     Hives    MEDICATIONS: Current Outpatient Medications on File Prior to Visit  Medication Sig Dispense Refill  . azelastine (ASTELIN) 0.1 % nasal spray As needed at bedtime.    [name] (EUCRISA) 2 % OINT Apply 2.5 g topically daily.    . famotidine (PEPCID) 20 MG tablet Take 20 mg by mouth 2 (two) times daily.    . fluticasone (FLONASE) 50 MCG/ACT nasal spray Place 2 sprays into both  nostrils daily. 16 g 1  . lisinopril (PRINIVIL,ZESTRIL) 10 MG tablet Take 1 tablet (10 mg total) by mouth daily. 90 tablet 0  . Multiple Vitamin (MULTIVITAMIN WITH MINERALS) TABS Take 1 tablet by mouth daily. Reported on 12/06/2015    . omeprazole (PRILOSEC) 20 MG capsule Take 1 capsule (20 mg total) by mouth daily. 30 capsule 6  . traMADol (ULTRAM) 50 MG tablet Take one tablet every 8 hours prn foot pain. 30 tablet 0  . verapamil (CALAN-SR) 120 MG CR tablet TAKE 1 TABLET BY MOUTH EVERY DAY 90 tablet 1   No current facility-administered medications on file prior to visit.     PAST MEDICAL HISTORY: Past Medical History:  Diagnosis Date  . Allergy   . Back pain   . Chronic RLQ pain    since 1995 when she had ectopic preg  . Constipation   . Deviated septum   . Diverticulitis   . Ectopic pregnancy 1995    x 1 , R side   . GERD (gastroesophageal reflux disease)   . HTN (hypertension) 05/24/2015  . Migraine without aura and without status migrainosus, not intractable   . Miscarriage    x 1  . Obesity, unspecified 07/06/2010  . Pelvic congestion    h/o   . Sleep apnea    uses cpap    PAST SURGICAL HISTORY: Past Surgical History:  Procedure Laterality Date  . ABDOMINAL  HYSTERECTOMY  12/2009   [facility]  . BREAST REDUCTION SURGERY  12/09  . COLONOSCOPY  2001   X3 ; diverticulosis  . FOOT SURGERY    . PARTIAL HYSTERECTOMY    . REDUCTION MAMMAPLASTY Bilateral   . RIGHT OOPHORECTOMY  12/2009   [facility]  . TUBAL LIGATION  2000    SOCIAL HISTORY: Social History   Tobacco Use  . Smoking status: Never Smoker  . Smokeless tobacco: Never Used  Substance Use Topics  . Alcohol use: Yes    Alcohol/week: 0.0 standard drinks    Comment: socially - 1 drink every 3 months  . Drug use: No    FAMILY HISTORY: Family History  Problem Relation Age of Onset  . Colon cancer Mother 52  . Colon cancer Maternal Aunt 69  . Stroke Father   . Diabetes Father   . Hypertension Father   .  Hyperlipidemia Father   . Heart disease Father   . Obesity Father   . Hypertension Sister   . Breast cancer Other        GM  . Colon polyps Brother   . Colitis Brother   . Heart attack Neg Hx     ROS: Review of Systems  Constitutional: Positive for weight loss.  Gastrointestinal: Negative for nausea and vomiting.  Musculoskeletal:       Negative for muscle weakness.    PHYSICAL EXAM: Blood pressure 123/83, pulse 89, temperature 98.3 F (36.8 C), temperature source Oral, height 5\' 5"  (1.651 m), weight 222 lb (100.7 kg), SpO2 98 %. Body mass index is 36.94 kg/m. Physical Exam  Constitutional: She is oriented to person, place, and time. She appears well-developed and well-nourished.  Cardiovascular: Normal rate.  Pulmonary/Chest: Effort normal.  Musculoskeletal: Normal range of motion.  Neurological: She is oriented to person, place, and time.  Skin: Skin is warm and dry.  Psychiatric: She has a normal mood and affect. Her behavior is normal.  Vitals reviewed.   RECENT LABS AND TESTS: BMET    Component Value Date/Time   NA 137 06/17/2018 2110   K 3.4 (L) 06/17/2018 2110   CL 100 06/17/2018 2110   CO2 25 06/17/2018 2110   GLUCOSE 97 06/17/2018 2110   BUN 10 06/17/2018 2110   CREATININE 1.16 (H) 06/17/2018 2110   CALCIUM 9.1 06/17/2018 2110   GFRNONAA 55 (L) 06/17/2018 2110   GFRAA >60 06/17/2018 2110   Lab Results  Component Value Date   HGBA1C 5.5 08/27/2018   HGBA1C 5.6 01/14/2017   Lab Results  Component Value Date   INSULIN 18.0 08/27/2018   CBC    Component Value Date/Time   WBC 8.7 06/17/2018 2110   RBC 4.40 06/17/2018 2110   HGB 13.4 06/17/2018 2110   HCT 39.4 06/17/2018 2110   PLT 218 06/17/2018 2110   MCV 89.5 06/17/2018 2110   MCH 30.5 06/17/2018 2110   MCHC 34.0 06/17/2018 2110   RDW 13.4 06/17/2018 2110   LYMPHSABS 2.9 06/17/2018 2110   MONOABS 0.8 06/17/2018 2110   EOSABS 0.1 06/17/2018 2110   BASOSABS 0.0 06/17/2018 2110    Iron/TIBC/Ferritin/ %Sat No results found for: IRON, TIBC, FERRITIN, IRONPCTSAT Lipid Panel     Component Value Date/Time   CHOL 205 (H) 08/27/2018 1124   TRIG 105 08/27/2018 1124   HDL 68 08/27/2018 1124   CHOLHDL 3 01/14/2017 1529   VLDL 37.2 01/14/2017 1529   LDLCALC 116 (H) 08/27/2018 1124   Hepatic Function Panel  Component Value Date/Time   PROT 8.2 (H) 06/17/2018 2110   ALBUMIN 4.4 06/17/2018 2110   AST 33 06/17/2018 2110   ALT 32 06/17/2018 2110   ALKPHOS 68 06/17/2018 2110   BILITOT 0.7 06/17/2018 2110   BILIDIR 0.0 04/23/2013 1442      Component Value Date/Time   TSH 1.140 08/27/2018 1124   TSH 0.87 04/12/2016 1515   TSH 0.66 04/23/2013 1442   Results for CINNAMON, MORENCY (MRN [id number]) as of 09/15/2018 10:19  Ref. Range 08/27/2018 11:24  Vitamin D, 25-Hydroxy Latest Ref Range: 30.0 - 100.0 ng/mL 22.5 (L)   ASSESSMENT AND PLAN: Vitamin D deficiency - Plan: Vitamin D, Ergocalciferol, (DRISDOL) 50000 units CAPS capsule  Insulin resistance  At risk for diabetes mellitus  Class 2 severe obesity with serious comorbidity and body mass index (BMI) of 37.0 to 37.9 in adult, unspecified obesity type ([place])  PLAN:  Vitamin D Deficiency Dajai was informed that low vitamin D levels contributes to fatigue and are associated with obesity, breast, and colon cancer. She agrees to start to take prescription Vit D @50 ,000 IU,1 weekly #4 with no refills and will follow up for routine testing of vitamin D, at least 2-3 times per year. She was informed of the risk of over-replacement of vitamin D and agrees to not increase her dose unless she discusses this with [place] first. She agrees to follow up in 2 weeks.  Insulin Resistance We discussed insulin resistance in detail. Tyhesha will continue to work on weight loss, exercise, and decreasing simple carbohydrates in her diet to help decrease the risk of diabetes. We discussed metformin including benefits and risks. She was  informed that eating too many simple carbohydrates or too many calories at one sitting increases the likelihood of GI side effects. Issis will defer metformin for now and prescription was not written today. Aliena agreed to follow up with [place] as directed to monitor her progress.  Diabetes risk counseling Ilissa was given extended (15 minutes) diabetes prevention counseling today. She is 47 y.o. female and has risk factors for diabetes including insulin resistance and obesity. We discussed intensive lifestyle modifications today with an emphasis on weight loss as well as increasing exercise and decreasing simple carbohydrates in her diet.  Obesity Altha is currently in the action stage of change. As such, her goal is to continue with weight loss efforts She has agreed to follow the Category 2 plan. She agrees to increase protein and do more meal planning. Thia has been instructed to work up to a goal of 150 minutes of combined cardio and strengthening exercise per week for weight loss and overall health benefits. We discussed the following Behavioral Modification Strategies today: increasing lean protein intake, increasing vegetables, increase H2O intake, decrease eating out, and no skipping meals.  Patryce has agreed to follow up with our clinic in 2 weeks. She was informed of the importance of frequent follow up visits to maximize her success with intensive lifestyle modifications for her multiple health conditions.   OBESITY BEHAVIORAL INTERVENTION VISIT  Today's visit was # 2   Starting weight: 224 lbs Starting date: 08/27/18 Today's weight : Weight: 222 lb (100.7 kg)  Today's date: 09/11/2018 Total lbs lost to date: 2  ASK: We discussed the diagnosis of obesity with Angela Nevin Debski today and Jacinta agreed to give [place] permission to discuss obesity behavioral modification therapy today.  ASSESS: Pakou has the diagnosis of obesity and her BMI today is 36.94. Kyleah is in the action  stage of change   ADVISE: Kaydense was educated on the multiple health risks of obesity as well as the benefit of weight loss to improve her health. She was advised of the need for long term treatment and the importance of lifestyle modifications to improve her current health and to decrease her risk of future health problems.  AGREE: Multiple dietary modification options and treatment options were discussed and Adelin agreed to follow the recommendations documented in the above note.  ARRANGE: Tanishka was educated on the importance of frequent visits to treat obesity as outlined per CMS and USPSTF guidelines and agreed to schedule her next follow up appointment today.  I, Marcille Blanco, am acting as Location manager for [employer]. Owens Shark, DO  I have reviewed the above documentation for accuracy and completeness, and I agree with the above. -Jearld Lesch, DO

## 2018-09-25 ENCOUNTER — Ambulatory Visit (INDEPENDENT_AMBULATORY_CARE_PROVIDER_SITE_OTHER): Payer: BLUE CROSS/BLUE SHIELD | Admitting: Bariatrics

## 2018-09-25 ENCOUNTER — Encounter (INDEPENDENT_AMBULATORY_CARE_PROVIDER_SITE_OTHER): Payer: Self-pay | Admitting: Bariatrics

## 2018-09-25 VITALS — BP 122/79 | HR 84 | Temp 98.1°F | Ht 65.0 in | Wt 221.0 lb

## 2018-09-25 DIAGNOSIS — E8881 Metabolic syndrome: Secondary | ICD-10-CM | POA: Diagnosis not present

## 2018-09-25 DIAGNOSIS — E559 Vitamin D deficiency, unspecified: Secondary | ICD-10-CM

## 2018-09-25 DIAGNOSIS — Z6836 Body mass index (BMI) 36.0-36.9, adult: Secondary | ICD-10-CM | POA: Diagnosis not present

## 2018-10-01 NOTE — Progress Notes (Signed)
Office: [phone]  /  Fax: [phone]   HPI:   Chief Complaint: OBESITY Desiree Dunn is here to discuss her progress with her obesity treatment plan. She is on the Category 2 plan and is following her eating plan approximately 70 % of the time. She states she is exercising 0 minutes 0 times per week. [name] has been struggling with the diet plan as snacking was difficult. She is adjusting to different portions and variety. She has only struggled with dinner.  Her weight is 221 lb (100.2 kg) today and has had a weight loss of 1 pound over a period of 2 weeks since her last visit. She has lost 3 lbs since starting treatment with [place].  Insulin Resistance [name] has a diagnosis of insulin resistance based on her elevated fasting insulin level >5. Although [name]'s blood glucose readings are still under good control, insulin resistance puts her at greater risk of metabolic syndrome and diabetes. Her Hgb A1c was 5.5 and Insulin was 18.0 on 08/27/18. We discussed metformin at her last visit and she declined.   Vitamin D deficiency [name] has a diagnosis of vitamin D deficiency. She is currently taking vit D and denies nausea, vomiting, or muscle weakness.  ALLERGIES: Allergies  Allergen Reactions  . Amoxicillin     Hives    MEDICATIONS: Current Outpatient Medications on File Prior to Visit  Medication Sig Dispense Refill  . azelastine (ASTELIN) 0.1 % nasal spray As needed at bedtime.    [name] (EUCRISA) 2 % OINT Apply 2.5 g topically daily.    . famotidine (PEPCID) 20 MG tablet Take 20 mg by mouth 2 (two) times daily.    . fluticasone (FLONASE) 50 MCG/ACT nasal spray Place 2 sprays into both nostrils daily. 16 g 1  . lisinopril (PRINIVIL,ZESTRIL) 10 MG tablet Take 1 tablet (10 mg total) by mouth daily. 90 tablet 0  . Multiple Vitamin (MULTIVITAMIN WITH MINERALS) TABS Take 1 tablet by mouth daily. Reported on 12/06/2015    . omeprazole (PRILOSEC) 20 MG capsule Take 1 capsule (20 mg total)  by mouth daily. 30 capsule 6  . traMADol (ULTRAM) 50 MG tablet Take one tablet every 8 hours prn foot pain. 30 tablet 0  . verapamil (CALAN-SR) 120 MG CR tablet TAKE 1 TABLET BY MOUTH EVERY DAY 90 tablet 1  . Vitamin D, Ergocalciferol, (DRISDOL) 50000 units CAPS capsule Take 1 capsule (50,000 Units total) by mouth every 7 (seven) days. 4 capsule 0   No current facility-administered medications on file prior to visit.     PAST MEDICAL HISTORY: Past Medical History:  Diagnosis Date  . Allergy   . Back pain   . Chronic RLQ pain    since 1995 when she had ectopic preg  . Constipation   . Deviated septum   . Diverticulitis   . Ectopic pregnancy 1995    x 1 , R side   . GERD (gastroesophageal reflux disease)   . HTN (hypertension) 05/24/2015  . Migraine without aura and without status migrainosus, not intractable   . Miscarriage    x 1  . Obesity, unspecified 07/06/2010  . Pelvic congestion    h/o   . Sleep apnea    uses cpap    PAST SURGICAL HISTORY: Past Surgical History:  Procedure Laterality Date  . ABDOMINAL HYSTERECTOMY  12/2009   [facility]  . BREAST REDUCTION SURGERY  12/09  . COLONOSCOPY  2001   X3 ; diverticulosis  . FOOT SURGERY    .  PARTIAL HYSTERECTOMY    . REDUCTION MAMMAPLASTY Bilateral   . RIGHT OOPHORECTOMY  12/2009   [facility]  . TUBAL LIGATION  2000    SOCIAL HISTORY: Social History   Tobacco Use  . Smoking status: Never Smoker  . Smokeless tobacco: Never Used  Substance Use Topics  . Alcohol use: Yes    Alcohol/week: 0.0 standard drinks    Comment: socially - 1 drink every 3 months  . Drug use: No    FAMILY HISTORY: Family History  Problem Relation Age of Onset  . Colon cancer Mother 69  . Colon cancer Maternal Aunt 77  . Stroke Father   . Diabetes Father   . Hypertension Father   . Hyperlipidemia Father   . Heart disease Father   . Obesity Father   . Hypertension Sister   . Breast cancer Other        GM  . Colon polyps Brother   .  Colitis Brother   . Heart attack Neg Hx     ROS: Review of Systems  Constitutional: Positive for weight loss.  Gastrointestinal: Negative for nausea and vomiting.  Musculoskeletal:       Negative for muscle weakness.    PHYSICAL EXAM: Blood pressure 122/79, pulse 84, temperature 98.1 F (36.7 C), temperature source Oral, height 5\' 5"  (1.651 m), weight 221 lb (100.2 kg), SpO2 98 %. Body mass index is 36.78 kg/m. Physical Exam  Constitutional: She is oriented to person, place, and time. She appears well-developed and well-nourished.  Cardiovascular: Normal rate.  Pulmonary/Chest: Effort normal.  Musculoskeletal: Normal range of motion.  Neurological: She is oriented to person, place, and time.  Skin: Skin is warm and dry.  Psychiatric: She has a normal mood and affect. Her behavior is normal.  Vitals reviewed.   RECENT LABS AND TESTS: BMET    Component Value Date/Time   NA 137 06/17/2018 2110   K 3.4 (L) 06/17/2018 2110   CL 100 06/17/2018 2110   CO2 25 06/17/2018 2110   GLUCOSE 97 06/17/2018 2110   BUN 10 06/17/2018 2110   CREATININE 1.16 (H) 06/17/2018 2110   CALCIUM 9.1 06/17/2018 2110   GFRNONAA 55 (L) 06/17/2018 2110   GFRAA >60 06/17/2018 2110   Lab Results  Component Value Date   HGBA1C 5.5 08/27/2018   HGBA1C 5.6 01/14/2017   Lab Results  Component Value Date   INSULIN 18.0 08/27/2018   CBC    Component Value Date/Time   WBC 8.7 06/17/2018 2110   RBC 4.40 06/17/2018 2110   HGB 13.4 06/17/2018 2110   HCT 39.4 06/17/2018 2110   PLT 218 06/17/2018 2110   MCV 89.5 06/17/2018 2110   MCH 30.5 06/17/2018 2110   MCHC 34.0 06/17/2018 2110   RDW 13.4 06/17/2018 2110   LYMPHSABS 2.9 06/17/2018 2110   MONOABS 0.8 06/17/2018 2110   EOSABS 0.1 06/17/2018 2110   BASOSABS 0.0 06/17/2018 2110   Iron/TIBC/Ferritin/ %Sat No results found for: IRON, TIBC, FERRITIN, IRONPCTSAT Lipid Panel     Component Value Date/Time   CHOL 205 (H) 08/27/2018 1124   TRIG  105 08/27/2018 1124   HDL 68 08/27/2018 1124   CHOLHDL 3 01/14/2017 1529   VLDL 37.2 01/14/2017 1529   LDLCALC 116 (H) 08/27/2018 1124   Hepatic Function Panel     Component Value Date/Time   PROT 8.2 (H) 06/17/2018 2110   ALBUMIN 4.4 06/17/2018 2110   AST 33 06/17/2018 2110   ALT 32 06/17/2018 2110  ALKPHOS 68 06/17/2018 2110   BILITOT 0.7 06/17/2018 2110   BILIDIR 0.0 04/23/2013 1442      Component Value Date/Time   TSH 1.140 08/27/2018 1124   TSH 0.87 04/12/2016 1515   TSH 0.66 04/23/2013 1442   Results for SORAYA, PAQUETTE (MRN [id number]) as of 10/01/2018 13:22  Ref. Range 08/27/2018 11:24  Vitamin D, 25-Hydroxy Latest Ref Range: 30.0 - 100.0 ng/mL 22.5 (L)   ASSESSMENT AND PLAN: Insulin resistance  Vitamin D deficiency  Class 2 severe obesity with serious comorbidity and body mass index (BMI) of 36.0 to 36.9 in adult, unspecified obesity type ([place])  PLAN:  Insulin Resistance Arliene will continue to work on weight loss, exercise, and decreasing simple carbohydrates in her diet to help decrease the risk of diabetes. We discussed metformin including benefits and risks. She was informed that eating too many simple carbohydrates or too many calories at one sitting increases the likelihood of GI side effects. Jashay agreed to continue decreasing carbohydrates and increasing protein in her diet and follow up with [place] as directed to monitor her progress in 2 weeks.  Vitamin D Deficiency Aliscia was informed that low vitamin D levels contributes to fatigue and are associated with obesity, breast, and colon cancer. She agrees to continue to take prescription Vit D @50 ,000 IU every week and will follow up for routine testing of vitamin D, at least 2-3 times per year. She was informed of the risk of over-replacement of vitamin D and agrees to not increase her dose unless she discusses this with [place] first. Jenavie agrees to follow up as directed.  I spent > than 50% of the 15 minute  visit on counseling as documented in the note.  Obesity Michaelina is currently in the action stage of change. As such, her goal is to continue with weight loss efforts. She has agreed to follow the Category 2 plan. She was given the information handouts "100 calorie snack ideas" and "Recipes". Tenicia has been instructed to work up to a goal of 150 minutes of combined cardio and strengthening exercise per week for weight loss and overall health benefits. We discussed the following Behavioral Modification Strategies today: increasing lean protein intake, decreasing simple carbohydrates, increasing vegetables, increase H2O intake, decrease eating out, no skipping meals, and work on meal planning and easy cooking plans.  Jonte has agreed to follow up with our clinic in 2 weeks. She was informed of the importance of frequent follow up visits to maximize her success with intensive lifestyle modifications for her multiple health conditions.   OBESITY BEHAVIORAL INTERVENTION VISIT  Today's visit was # 3   Starting weight: 224 lbs Starting date: 08/27/18 Today's weight : Weight: 221 lb (100.2 kg)  Today's date: 09/25/2018 Total lbs lost to date: 3  ASK: We discussed the diagnosis of obesity with Angela Nevin Alamo today and Lakita agreed to give [place] permission to discuss obesity behavioral modification therapy today.  ASSESS: Margretta has the diagnosis of obesity and her BMI today is 36.78. Athalia is in the action stage of change.   ADVISE: Dustie was educated on the multiple health risks of obesity as well as the benefit of weight loss to improve her health. She was advised of the need for long term treatment and the importance of lifestyle modifications to improve her current health and to decrease her risk of future health problems.  AGREE: Multiple dietary modification options and treatment options were discussed and Kynisha agreed to follow the recommendations documented in the  above  note.  ARRANGE: Tifany was educated on the importance of frequent visits to treat obesity as outlined per CMS and USPSTF guidelines and agreed to schedule her next follow up appointment today.  I, Marcille Blanco, am acting as Location manager for [employer]. Owens Shark, DO  I have reviewed the above documentation for accuracy and completeness, and I agree with the above. -Jearld Lesch, DO

## 2018-10-08 ENCOUNTER — Ambulatory Visit (INDEPENDENT_AMBULATORY_CARE_PROVIDER_SITE_OTHER): Payer: BLUE CROSS/BLUE SHIELD | Admitting: Neurology

## 2018-10-08 ENCOUNTER — Encounter: Payer: Self-pay | Admitting: Neurology

## 2018-10-08 VITALS — BP 131/88 | HR 74 | Ht 64.0 in | Wt 222.0 lb

## 2018-10-08 DIAGNOSIS — J343 Hypertrophy of nasal turbinates: Secondary | ICD-10-CM | POA: Diagnosis not present

## 2018-10-08 DIAGNOSIS — G4733 Obstructive sleep apnea (adult) (pediatric): Secondary | ICD-10-CM | POA: Insufficient documentation

## 2018-10-08 DIAGNOSIS — Z9989 Dependence on other enabling machines and devices: Secondary | ICD-10-CM

## 2018-10-08 DIAGNOSIS — J342 Deviated nasal septum: Secondary | ICD-10-CM | POA: Insufficient documentation

## 2018-10-08 DIAGNOSIS — Q674 Other congenital deformities of skull, face and jaw: Secondary | ICD-10-CM | POA: Diagnosis not present

## 2018-10-08 MED ORDER — FLUTICASONE PROPIONATE 50 MCG/ACT NA SUSP: 2.0000 | Freq: Every day | NASAL | 1 refills | Status: DC

## 2018-10-08 NOTE — Progress Notes (Signed)
SLEEP MEDICINE CLINIC   Provider:  Larey Seat, MD   Primary Care Physician:  Desiree Chard, MD  Referring Provider: Glendale Chard, MD   Chief Complaint  Patient presents with  . Follow-up    pt alone, rm 10 pt states that CPAP is working pretty good. 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  There was primary loud snoring and oral breathing.  She appeared rather restless.  The study included a attempt to treat her with CPAP therapy and the CPAP titration part was incomplete as central apneas emerged.  I therefore ordered an auto titration CPAP I also referred her for ENT surgery and recommended a medically supervised weight loss program. She received an auto titration CPAP between 5 and 16 cmH2O with 3 cm EPR, her average user time on days used is 5 hours and 43 minutes but she was unable to use the CPAP machine since the 15th of this month due to nasal congestion and sinusitis.  She had minor air leaks the 95th percentile pressure was only 10 cmH2O and the AHI was reduced to 2.1.  I consider the settings as working very well for her, but she certainly will benefit from a nasal septum surgical correction.  In addition we reviewed today her current fatigue and sleepiness score her Epworth sleepiness score on CPAP is down to 5 points from previously 9 her fatigue severity however remains high which also may be stress-induced.  HPI:  Desiree Dunn is a 47 y.o. female patient who had been evaluated for sleep apnea in 2012, following a referral by Desiree Dunn, and was diagnosed with OSA, given CPAP but couldn't get used to it.   She is seen here in a re- referral from Desiree Dunn. She had  a partial hysterectomy 2-/2011, left one ovary and has now still menopausal symptoms at night.  Chief complaint according to patient : She feels her energy has decreased greatly.   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  Average time on week days to return home is between 6 and 7 PM, dinner at home bedtime at 11.00 - her bedroom is cool, quiet and dark. Sleeps on her right side, on 2 pillows, she is usually asleep within 30 minutes. Her husband reports her to snore loudly, and she goes to the bathroom 3-4 times each night.  Stays asleep not longer than 2 hours en bloc. sometimes she finds herself unable to return to sleep. Aches and pains and worries . She dreams less she feels. She rises at 6.30 and feels not refreshed nor rested. Has headaches in AM!! Dry mouth, Heartburn, not short of breath, no dizziness, palpitations , but diaphoresis is positively a factor.    Sleep medical history and family sleep history:  OSA not diagnosed in parents, one brother has OSA.  [name] as she was known in 2012 underwent a sleep study ordered by Desiree Dunn on 14 September  of that year, she had a history of morning migraine headaches, obesity, allergic rhinitis fatigue and was noted to snore.  She was not excessively daytime sleepy at the time but she did endorse depression symptoms.  BMI at the time was 35.  I do not have her baseline results available.  The patient was titrated treated to CPAP so obviously the have to be a study before diagnosing her.  During the night of the titration it was evident that she woke up very frequently, she had mostly central apneas on the CPAP residual AHI was 4.8, RDI 7.9 which indicates that she snores loudly.  Supine AHI was 3.2 there were no PLM's and her  heart rate was regular.  It was noted that the previous polysomnography showed mild apnea.  Improvement was under CPAP pressure of 9 cmH2O, snoring treatment was not fully successful the patient required 15 cmH2O for that, but under these high pressures central apneas emerged.  I had written for a nasal mask or nasal pillow at the time.  The order was issued on 07 August 2011.  Social history:  Psychologist, educational, [employer]  She is working many side jobs.  Non smoker -  Social drinker, 2 / month. Caffeine:  coffee 3-5 times a week, not iced tea, no black tea, sodas without caffeine.   Review of Systems: Out of a complete 14 system review, the patient complains of only the following symptoms, and all other reviewed systems are negative. Snoring, fatigue , nasal congestion, chronic sinus pressure, headaches.   Epworth Sleepiness score on CPAP , now    from 9 points , Fatigue severity score 62  , depression score 5/15    Social History   Socioeconomic History  . Marital status: Married    Spouse name: Desiree Dunn  . Number of children: 2  . Years of education: Not on file  . Highest education level: Not on file  Occupational History  . Occupation: Counsellor, Par time  . Occupation: works full time at [employer]: [place]  . Financial resource strain: Not on file  . Food insecurity:    Worry: Not on file    Inability: Not on file  . Transportation needs:    Medical: Not on file    Non-medical: Not on file  Tobacco Use  . Smoking status: Never Smoker  . Smokeless tobacco: Never Used  Substance and Sexual Activity  . Alcohol use: Yes    Alcohol/week: 0.0 standard drinks    Comment: socially - 1 drink every 3 months  . Drug use: No  . Sexual activity: Yes    Partners: Male    Birth control/protection: Surgical  Lifestyle  . Physical activity:    Days per week: Not on file    Minutes per session: Not on file  . Stress: Not  on file  Relationships  . Social connections:    Talks on phone: Not on file    Gets together: Not on file    Attends religious service: Not on file    Active member of club or organization: Not on file    Attends meetings of clubs or organizations: Not on file    Relationship status: Not on file  . Intimate partner violence:    Fear of current or ex partner: Not on file    Emotionally abused: Not on file    Physically abused: Not on file  Forced sexual activity: Not on file  Other Topics Concern  . Not on file  Social History Narrative   Household-- pt , husband and children   daughter 23   son 31    Family History  Problem Relation Age of Onset  . Colon Dunn Mother 59  . Colon Dunn Maternal Aunt 94  . Stroke Father   . Diabetes Father   . Hypertension Father   . Hyperlipidemia Father   . Heart disease Father   . Obesity Father   . Hypertension Sister   . Breast Dunn Other        GM  . Colon polyps Brother   . Colitis Brother   . Heart attack Neg Hx     Past Medical History:  Diagnosis Date  . Allergy   . Back pain   . Chronic RLQ pain    since 1995 when she had ectopic preg  . Constipation   . Deviated septum   . Diverticulitis   . Ectopic pregnancy 1995    x 1 , R side   . GERD (gastroesophageal reflux disease)   . HTN (hypertension) 05/24/2015  . Migraine without aura and without status migrainosus, not intractable   . Miscarriage    x 1  . Obesity, unspecified 07/06/2010  . Pelvic congestion    h/o   . Sleep apnea    uses cpap    Past Surgical History:  Procedure Laterality Date  . ABDOMINAL HYSTERECTOMY  12/2009   [facility]  . BREAST REDUCTION SURGERY  12/09  . COLONOSCOPY  2001   X3 ; diverticulosis  . FOOT SURGERY    . PARTIAL HYSTERECTOMY    . REDUCTION MAMMAPLASTY Bilateral   . RIGHT OOPHORECTOMY  12/2009   [facility]  . TUBAL LIGATION  2000    Current Outpatient Medications  Medication Sig Dispense Refill  . azelastine  (ASTELIN) 0.1 % nasal spray As needed at bedtime.    . famotidine (PEPCID) 20 MG tablet Take 20 mg by mouth 2 (two) times daily.    . fluticasone (FLONASE) 50 MCG/ACT nasal spray Place 2 sprays into both nostrils daily. 16 g 1  . lisinopril (PRINIVIL,ZESTRIL) 10 MG tablet Take 1 tablet (10 mg total) by mouth daily. 90 tablet 0  . Multiple Vitamin (MULTIVITAMIN WITH MINERALS) TABS Take 1 tablet by mouth daily. Reported on 12/06/2015    . omeprazole (PRILOSEC) 20 MG capsule Take 1 capsule (20 mg total) by mouth daily. 30 capsule 6  . traMADol (ULTRAM) 50 MG tablet Take one tablet every 8 hours prn foot pain. 30 tablet 0  . verapamil (CALAN-SR) 120 MG CR tablet TAKE 1 TABLET BY MOUTH EVERY DAY 90 tablet 1  . Vitamin D, Ergocalciferol, (DRISDOL) 50000 units CAPS capsule Take 1 capsule (50,000 Units total) by mouth every 7 (seven) days. 4 capsule 0   No current facility-administered medications for this visit.     Allergies as of 10/08/2018 - Review Complete 10/08/2018  Allergen Reaction Noted  . Amoxicillin  09/09/2007    Vitals: BP 131/88   Pulse 74   Ht 5\' 4"  (1.626 m)   Wt 222 lb (100.7 kg)   BMI 38.11 kg/m  Last Weight:  Wt Readings from Last 1 Encounters:  10/08/18 222 lb (100.7 kg)   ENI:DPOE mass index is 38.11 kg/m.     Last Height:   Ht Readings from Last 1 Encounters:  10/08/18 5\' 4"  (1.626 m)    Physical exam:  Patient lost 5 pounds.   General: The patient is awake, alert and appears not in acute distress. The patient is well groomed. Head: Normocephalic, atraumatic. Neck is supple. Mallampati 4  neck circumference:17 ". Nasal airflow completely restricted- swollen lining of the nasal airway, nasal voice. today patent on the right, congested on the left side, Retrognathia is seen.  Cardiovascular:  Regular rate and rhythm, without  murmurs or carotid bruit, and without distended neck veins. Respiratory: Lungs are clear to auscultation. Skin:  Without evidence of  edema, or rash Trunk: BMI is 38.11 from 39 last visit. The patient's posture is erect   Neurologic exam : The patient is awake and alert, oriented to place and time.   Attention span & concentration ability appears normal.  Speech is fluent,  with dysphonia or aphasia.  Mood and affect are appropriate.  Cranial nerves: Pupils are equal and briskly reactive to light. Funduscopic exam without evidence of pallor or edema. Extraocular movements  in vertical and horizontal planes intact and without nystagmus. Visual fields by finger perimetry are intact. Hearing to finger rub intact. Trigger point for sinus headaches are positive, retro-orbital pressure.   Facial motor strength is symmetric , her tongue and uvula move in midline. Shoulder shrug was symmetrical.   Motor exam: Normal tone, muscle bulk and symmetric strength in all extremities.Sensory:  Fine touch, pinprick and vibration were normal. Coordination:  Finger-to-nose maneuver  normal without evidence of ataxia, dysmetria or tremor. Gait and station: Patient walks without assistive device.Tandem gait is unfragmented. Turns with 3 Steps.  Deep tendon reflexes: in the upper and lower extremities are symmetric and intact.  Assessment:  After physical and neurologic examination, review of the results of polysomnography and / or neurophysiology testing and pre-existing records as far as provided in visit., my assessment is   1)OSA - confirmed as of moderate severity- CPAP works in reducing the AHI to  2.1/h.  2) Compliance was complicated by nasal congestion. ENT consult took place with dr. Wilburn Cornelia, he plans surgery for 10-31-2018 .  Sinusitis- sinus pressure headaches, and retro- orbital pressure. Headaches cause blurred vision, light nausea, photophobia is endorsed.   The patient was advised of the nature of the diagnosed disorder , the treatment options and the  risks for general health and wellness arising from not treating the  condition.   I spent more than 15 minutes of face to face time with the patient.  Greater than 50% of time was spent in counseling and coordination of care. We have discussed the diagnosis and differential and I answered the patient's questions.   Plan:  Treatment plan and additional workup :  Continue CPAP 10 days after nasal septum surgery/ turbinate reduction.   Good luck !  Rv in 4  month with NP or me   Larey Seat, MD 62/22/9798, 9:21 AM  Certified in Neurology by ABPN Certified in [place] by [facility] [address], [place], [place] [address]

## 2018-10-08 NOTE — Addendum Note (Signed)
Addended by: Larey Seat on: 10/08/2018 08:46 AM   Modules accepted: Orders

## 2018-10-14 ENCOUNTER — Ambulatory Visit (INDEPENDENT_AMBULATORY_CARE_PROVIDER_SITE_OTHER): Payer: BLUE CROSS/BLUE SHIELD | Admitting: Bariatrics

## 2018-10-15 ENCOUNTER — Other Ambulatory Visit: Payer: Self-pay | Admitting: Otolaryngology

## 2018-10-15 ENCOUNTER — Ambulatory Visit: Payer: BLUE CROSS/BLUE SHIELD | Admitting: Internal Medicine

## 2018-10-22 NOTE — Pre-Procedure Instructions (Signed)
Desiree Dunn  10/22/2018      [id number] [place], [place] [address] [place] [place] [address] Phone: [phone] Fax: [phone]    Your procedure is scheduled on December 2nth.  Report to [facility] Admitting at 0700 A.M.  Call this number if you have problems the morning of surgery:  [phone]   Remember:  Do not eat or drink after midnight.    Take these medicines the morning of surgery with A SIP OF WATER  famotidine (PEPCID)  fluticasone (FLONASE) omeprazole (PRILOSEC) traMADol (ULTRAM) if needed  7 days prior to surgery STOP taking any Aspirin(unless otherwise instructed by your surgeon), Aleve, Naproxen, Ibuprofen, Motrin, Advil, Goody's, BC's, all herbal medications, fish oil, and all vitamins     Do not wear jewelry, make-up or nail polish.  Do not wear lotions, powders, or perfumes, or deodorant.  Do not shave 48 hours prior to surgery.  Men may shave face and neck.  Do not bring valuables to the hospital.  [facility] is not responsible for any belongings or valuables.  Contacts, dentures or bridgework may not be worn into surgery.  Leave your suitcase in the car.  After surgery it may be brought to your room.  For patients admitted to the hospital, discharge time will be determined by your treatment team.  Patients discharged the day of surgery will not be allowed to drive home.    [place]- Preparing For Surgery  Before surgery, you can play an important role. Because skin is not sterile, your skin needs to be as free of germs as possible. You can reduce the number of germs on your skin by washing with CHG (chlorahexidine gluconate) Soap before surgery.  CHG is an antiseptic cleaner which kills germs and bonds with the skin to continue killing germs even after washing.    Oral Hygiene is also important to reduce your risk of infection.  Remember - BRUSH YOUR TEETH THE MORNING OF SURGERY WITH  YOUR REGULAR TOOTHPASTE  Please do not use if you have an allergy to CHG or antibacterial soaps. If your skin becomes reddened/irritated stop using the CHG.  Do not shave (including legs and underarms) for at least 48 hours prior to first CHG shower. It is OK to shave your face.  Please follow these instructions carefully.   1. Shower the NIGHT BEFORE SURGERY and the MORNING OF SURGERY with CHG.   2. If you chose to wash your hair, wash your hair first as usual with your normal shampoo.  3. After you shampoo, rinse your hair and body thoroughly to remove the shampoo.  4. Use CHG as you would any other liquid soap. You can apply CHG directly to the skin and wash gently with a scrungie or a clean washcloth.   5. Apply the CHG Soap to your body ONLY FROM THE NECK DOWN.  Do not use on open wounds or open sores. Avoid contact with your eyes, ears, mouth and genitals (private parts). Wash Face and genitals (private parts)  with your normal soap.  6. Wash thoroughly, paying special attention to the area where your surgery will be performed.  7. Thoroughly rinse your body with warm water from the neck down.  8. DO NOT shower/wash with your normal soap after using and rinsing off the CHG Soap.  9. Pat yourself dry with a CLEAN TOWEL.  10. Wear CLEAN PAJAMAS to bed the night before surgery, wear comfortable clothes  the morning of surgery  11. Place CLEAN SHEETS on your bed the night of your first shower and DO NOT SLEEP WITH PETS.    Day of Surgery:  Do not apply any deodorants/lotions.  Please wear clean clothes to the hospital/surgery center.   Remember to brush your teeth WITH YOUR REGULAR TOOTHPASTE.    Please read over the following fact sheets that you were given.

## 2018-10-23 ENCOUNTER — Encounter (HOSPITAL_COMMUNITY)
Admission: RE | Admit: 2018-10-23 | Discharge: 2018-10-23 | Disposition: A | Discharge status: Home / Self Care | Payer: BLUE CROSS/BLUE SHIELD | Source: Ambulatory Visit | Attending: Otolaryngology | Admitting: Otolaryngology

## 2018-10-23 ENCOUNTER — Other Ambulatory Visit: Payer: Self-pay | Admitting: Internal Medicine

## 2018-10-23 ENCOUNTER — Encounter (INDEPENDENT_AMBULATORY_CARE_PROVIDER_SITE_OTHER): Payer: Self-pay | Admitting: Bariatrics

## 2018-10-23 ENCOUNTER — Encounter (HOSPITAL_COMMUNITY): Payer: Self-pay

## 2018-10-23 ENCOUNTER — Ambulatory Visit (INDEPENDENT_AMBULATORY_CARE_PROVIDER_SITE_OTHER): Payer: BLUE CROSS/BLUE SHIELD | Admitting: Bariatrics

## 2018-10-23 ENCOUNTER — Other Ambulatory Visit: Payer: Self-pay

## 2018-10-23 VITALS — BP 126/81 | HR 79 | Temp 97.7°F | Ht 64.0 in | Wt 217.0 lb

## 2018-10-23 DIAGNOSIS — Z6836 Body mass index (BMI) 36.0-36.9, adult: Secondary | ICD-10-CM

## 2018-10-23 DIAGNOSIS — Q674 Other congenital deformities of skull, face and jaw: Secondary | ICD-10-CM | POA: Insufficient documentation

## 2018-10-23 DIAGNOSIS — E559 Vitamin D deficiency, unspecified: Secondary | ICD-10-CM | POA: Diagnosis not present

## 2018-10-23 DIAGNOSIS — G4733 Obstructive sleep apnea (adult) (pediatric): Secondary | ICD-10-CM | POA: Insufficient documentation

## 2018-10-23 DIAGNOSIS — Z9989 Dependence on other enabling machines and devices: Secondary | ICD-10-CM | POA: Insufficient documentation

## 2018-10-23 DIAGNOSIS — I10 Essential (primary) hypertension: Secondary | ICD-10-CM

## 2018-10-23 DIAGNOSIS — E8881 Metabolic syndrome: Secondary | ICD-10-CM | POA: Diagnosis not present

## 2018-10-23 DIAGNOSIS — J343 Hypertrophy of nasal turbinates: Secondary | ICD-10-CM | POA: Diagnosis present

## 2018-10-23 DIAGNOSIS — Z9189 Other specified personal risk factors, not elsewhere classified: Secondary | ICD-10-CM

## 2018-10-23 LAB — CBC
HCT: 41.8 % (ref 36.0–46.0)
Hemoglobin: 13.2 g/dL (ref 12.0–15.0)
MCH: 29.2 pg (ref 26.0–34.0)
MCHC: 31.6 g/dL (ref 30.0–36.0)
MCV: 92.5 fL (ref 80.0–100.0)
Platelets: 243 10*3/uL (ref 150–400)
RBC: 4.52 MIL/uL (ref 3.87–5.11)
RDW: 13.4 % (ref 11.5–15.5)
WBC: 4.9 10*3/uL (ref 4.0–10.5)
nRBC: 0 % (ref 0.0–0.2)

## 2018-10-23 LAB — BASIC METABOLIC PANEL
Anion gap: 11 (ref 5–15)
BUN: 9 mg/dL (ref 6–20)
CO2: 22 mmol/L (ref 22–32)
Calcium: 9.2 mg/dL (ref 8.9–10.3)
Chloride: 107 mmol/L (ref 98–111)
Creatinine, Ser: 0.92 mg/dL (ref 0.44–1.00)
GFR calc Af Amer: >60 mL/min (ref >60)
GFR calc non Af Amer: >60 mL/min (ref >60)
Glucose, Bld: 91 mg/dL (ref 70–99)
Potassium: 4 mmol/L (ref 3.5–5.1)
Sodium: 140 mmol/L (ref 135–145)

## 2018-10-23 MED ORDER — VITAMIN D (ERGOCALCIFEROL) 1.25 MG (50000 UNIT) PO CAPS: 50000.0000 [IU] | ORAL_CAPSULE | ORAL | 0 refills | Status: DC

## 2018-10-23 NOTE — Progress Notes (Signed)
PCP: Glendale Chard  DM: denies  SA: yes, wears CPAP  Pt denies SOB, cough, fever, chest pain  Pt stated understanding of instructions given for DOS.

## 2018-10-23 NOTE — Pre-Procedure Instructions (Addendum)
Yizel Douglas-McMillan  10/23/2018      [id number] - [place], [place] [address] [place] [place] [address] Phone: [phone] Fax: [phone]    Your procedure is scheduled on December 20th.  Report to [facility] Admitting at 7:00 A.M.  Call this number if you have problems the morning of surgery:  [phone]   Remember:  Do not eat or drink after midnight.    Take these medicines the morning of surgery with A SIP OF WATER  famotidine (PEPCID)  fluticasone (FLONASE) omeprazole (PRILOSEC) traMADol (ULTRAM) if needed  7 days prior to surgery STOP taking any Aspirin(unless otherwise instructed by your surgeon), Aleve, Naproxen, Ibuprofen, Motrin, Advil, Goody's, BC's, all herbal medications, fish oil, and all vitamins     Do not wear jewelry, make-up or nail polish.  Do not wear lotions, powders, or perfumes, or deodorant.  Do not shave 48 hours prior to surgery.  Men may shave face and neck.  Do not bring valuables to the hospital.  [facility] is not responsible for any belongings or valuables.  Contacts, dentures or bridgework may not be worn into surgery.  Leave your suitcase in the car.  After surgery it may be brought to your room.  For patients admitted to the hospital, discharge time will be determined by your treatment team.  Patients discharged the day of surgery will not be allowed to drive home.    [place]- Preparing For Surgery  Before surgery, you can play an important role. Because skin is not sterile, your skin needs to be as free of germs as possible. You can reduce the number of germs on your skin by washing with CHG (chlorahexidine gluconate) Soap before surgery.  CHG is an antiseptic cleaner which kills germs and bonds with the skin to continue killing germs even after washing.    Oral Hygiene is also important to reduce your risk of infection.  Remember - BRUSH YOUR TEETH THE MORNING OF SURGERY WITH  YOUR REGULAR TOOTHPASTE  Please do not use if you have an allergy to CHG or antibacterial soaps. If your skin becomes reddened/irritated stop using the CHG.  Do not shave (including legs and underarms) for at least 48 hours prior to first CHG shower. It is OK to shave your face.  Please follow these instructions carefully.   1. Shower the NIGHT BEFORE SURGERY and the MORNING OF SURGERY with CHG.   2. If you chose to wash your hair, wash your hair first as usual with your normal shampoo.  3. After you shampoo, rinse your hair and body thoroughly to remove the shampoo.  4. Use CHG as you would any other liquid soap. You can apply CHG directly to the skin and wash gently with a scrungie or a clean washcloth.   5. Apply the CHG Soap to your body ONLY FROM THE NECK DOWN.  Do not use on open wounds or open sores. Avoid contact with your eyes, ears, mouth and genitals (private parts). Wash Face and genitals (private parts)  with your normal soap.  6. Wash thoroughly, paying special attention to the area where your surgery will be performed.  7. Thoroughly rinse your body with warm water from the neck down.  8. DO NOT shower/wash with your normal soap after using and rinsing off the CHG Soap.  9. Pat yourself dry with a CLEAN TOWEL.  10. Wear CLEAN PAJAMAS to bed the night before surgery, wear comfortable clothes  the morning of surgery  11. Place CLEAN SHEETS on your bed the night of your first shower and DO NOT SLEEP WITH PETS.    Day of Surgery:  Do not apply any deodorants/lotions.  Please wear clean clothes to the hospital/surgery center.   Remember to brush your teeth WITH YOUR REGULAR TOOTHPASTE.    Please read over the following fact sheets that you were given.

## 2018-10-27 NOTE — Progress Notes (Signed)
Office: [phone]  /  Fax: [phone]   HPI:   Chief Complaint: OBESITY Desiree Dunn is here to discuss her progress with her obesity treatment plan. She is on the Category 2 plan and is following her eating plan approximately 90 % of the time. She states she is walking 3700 steps daily 5 times per week. Desiree Dunn is doing well on the category 2 plan in addition to doing portion control. She is doing well with  drinking water. She will be having surgery for a deviated septum (ENT) on 10/31/18.  Her weight is 217 lb (98.4 kg) today and has had a weight loss of 4 pounds over a period of 4 weeks since her last visit. She has lost 7 lbs since starting treatment with [place].  Vitamin D deficiency Desiree Dunn has a diagnosis of vitamin D deficiency. She is currently taking vit D and denies nausea, vomiting or muscle weakness.  Hypertension Desiree Dunn is a 47 y.o. female with hypertension. She is taking Verapamil and Lisinopril. Desiree Dunn denies chest pain or shortness of breath on exertion. She is working weight loss to help control her blood pressure with the goal of decreasing her risk of heart attack and stroke. Desiree Dunn blood pressure is reasonably well controlled.   Insulin Resistance Desiree Dunn has a diagnosis of insulin resistance based on her elevated fasting insulin level of 18.0. Her last A1C was at 5.5. Although Larkyn's blood glucose readings are still under good control, insulin resistance puts her at greater risk of metabolic syndrome and diabetes. She is not taking medications currently and continues to work on diet and exercise to decrease risk of diabetes.  At risk for diabetes Desiree Dunn is at higher than average risk for developing diabetes due to her obesity and insulin resistance. She currently denies polyuria or polydipsia.  ALLERGIES: Allergies  Allergen Reactions  . Amoxicillin     Hives    MEDICATIONS: Current Outpatient Medications on File Prior to Visit  Medication  Sig Dispense Refill  . acetaminophen (TYLENOL) 500 MG tablet Take 1,000 mg by mouth as needed for mild pain or headache.    [name] azelastine (ASTELIN) 0.1 % nasal spray Place 1 spray into both nostrils at bedtime. As needed at bedtime.    . famotidine (PEPCID) 20 MG tablet Take 20 mg by mouth daily.     . fexofenadine (ALLEGRA) 180 MG tablet Take 180 mg by mouth daily.    . fluticasone (FLONASE) 50 MCG/ACT nasal spray Place 2 sprays into both nostrils daily. 16 g 1  . ibuprofen (ADVIL,MOTRIN) 200 MG tablet Take 400 mg by mouth as needed for moderate pain.    . Multiple Vitamin (MULTIVITAMIN WITH MINERALS) TABS Take 1 tablet by mouth daily. Reported on 12/06/2015    . omeprazole (PRILOSEC) 20 MG capsule Take 1 capsule (20 mg total) by mouth daily. 30 capsule 6  . traMADol (ULTRAM) 50 MG tablet Take one tablet every 8 hours prn foot pain. 30 tablet 0  . verapamil (CALAN-SR) 120 MG CR tablet TAKE 1 TABLET BY MOUTH EVERY DAY 90 tablet 1   No current facility-administered medications on file prior to visit.     PAST MEDICAL HISTORY: Past Medical History:  Diagnosis Date  . Allergy   . Back pain   . Chronic RLQ pain    since 1995 when she had ectopic preg  . Constipation   . Deviated septum   . Diverticulitis   . Ectopic pregnancy 1995    x 1 ,  R side   . GERD (gastroesophageal reflux disease)   . HTN (hypertension) 05/24/2015  . Migraine without aura and without status migrainosus, not intractable   . Miscarriage    x 1  . Obesity, unspecified 07/06/2010  . Pelvic congestion    h/o   . Sleep apnea    uses cpap    PAST SURGICAL HISTORY: Past Surgical History:  Procedure Laterality Date  . ABDOMINAL HYSTERECTOMY  12/2009   [facility]  . BREAST REDUCTION SURGERY  12/09  . COLONOSCOPY  2001   X3 ; diverticulosis  . FOOT SURGERY    . PARTIAL HYSTERECTOMY    . REDUCTION MAMMAPLASTY Bilateral   . RIGHT OOPHORECTOMY  12/2009   [facility]  . TUBAL LIGATION  2000    SOCIAL  HISTORY: Social History   Tobacco Use  . Smoking status: Never Smoker  . Smokeless tobacco: Never Used  Substance Use Topics  . Alcohol use: Yes    Alcohol/week: 0.0 standard drinks    Comment: socially - 1 drink every 3 months  . Drug use: No    FAMILY HISTORY: Family History  Problem Relation Age of Onset  . Colon cancer Mother 26  . Colon cancer Maternal Aunt 60  . Stroke Father   . Diabetes Father   . Hypertension Father   . Hyperlipidemia Father   . Heart disease Father   . Obesity Father   . Hypertension Sister   . Breast cancer Other        GM  . Colon polyps Brother   . Colitis Brother   . Heart attack Neg Hx     ROS: Review of Systems  Constitutional: Positive for weight loss.  Respiratory: Negative for shortness of breath (on exertion).   Cardiovascular: Negative for chest pain.  Gastrointestinal: Negative for nausea and vomiting.  Genitourinary: Negative for frequency.  Musculoskeletal:       Negative for muscle weakness.  Endo/Heme/Allergies: Negative for polydipsia.    PHYSICAL EXAM: Blood pressure 126/81, pulse 79, temperature 97.7 F (36.5 C), temperature source Oral, height 5\' 4"  (1.626 m), weight 217 lb (98.4 kg), SpO2 98 %. Body mass index is 37.25 kg/m. Physical Exam Vitals signs reviewed.  Constitutional:      Appearance: Normal appearance. She is well-developed. She is obese.  Cardiovascular:     Rate and Rhythm: Normal rate.  Pulmonary:     Effort: Pulmonary effort is normal.  Musculoskeletal: Normal range of motion.  Skin:    General: Skin is warm and dry.  Neurological:     Mental Status: She is alert and oriented to person, place, and time.  Psychiatric:        Mood and Affect: Mood normal.        Behavior: Behavior normal.     RECENT LABS AND TESTS: BMET    Component Value Date/Time   NA 140 10/23/2018 0831   K 4.0 10/23/2018 0831   CL 107 10/23/2018 0831   CO2 22 10/23/2018 0831   GLUCOSE 91 10/23/2018 0831   BUN  9 10/23/2018 0831   CREATININE 0.92 10/23/2018 0831   CALCIUM 9.2 10/23/2018 0831   GFRNONAA >60 10/23/2018 0831   GFRAA >60 10/23/2018 0831   Lab Results  Component Value Date   HGBA1C 5.5 08/27/2018   HGBA1C 5.6 01/14/2017   Lab Results  Component Value Date   INSULIN 18.0 08/27/2018   CBC    Component Value Date/Time   WBC 4.9 10/23/2018 4174  RBC 4.52 10/23/2018 0838   HGB 13.2 10/23/2018 0838   HCT 41.8 10/23/2018 0838   PLT 243 10/23/2018 0838   MCV 92.5 10/23/2018 0838   MCH 29.2 10/23/2018 0838   MCHC 31.6 10/23/2018 0838   RDW 13.4 10/23/2018 0838   LYMPHSABS 2.9 06/17/2018 2110   MONOABS 0.8 06/17/2018 2110   EOSABS 0.1 06/17/2018 2110   BASOSABS 0.0 06/17/2018 2110   Iron/TIBC/Ferritin/ %Sat No results found for: IRON, TIBC, FERRITIN, IRONPCTSAT Lipid Panel     Component Value Date/Time   CHOL 205 (H) 08/27/2018 1124   TRIG 105 08/27/2018 1124   HDL 68 08/27/2018 1124   CHOLHDL 3 01/14/2017 1529   VLDL 37.2 01/14/2017 1529   LDLCALC 116 (H) 08/27/2018 1124   Hepatic Function Panel     Component Value Date/Time   PROT 8.2 (H) 06/17/2018 2110   ALBUMIN 4.4 06/17/2018 2110   AST 33 06/17/2018 2110   ALT 32 06/17/2018 2110   ALKPHOS 68 06/17/2018 2110   BILITOT 0.7 06/17/2018 2110   BILIDIR 0.0 04/23/2013 1442      Component Value Date/Time   TSH 1.140 08/27/2018 1124   TSH 0.87 04/12/2016 1515   TSH 0.66 04/23/2013 1442    Ref. Range 08/27/2018 11:24  Vitamin D, 25-Hydroxy Latest Ref Range: 30.0 - 100.0 ng/mL 22.5 (L)    ASSESSMENT AND PLAN: Vitamin D deficiency - Plan: Vitamin D, Ergocalciferol, (DRISDOL) 1.25 MG (50000 UT) CAPS capsule  Essential hypertension  Insulin resistance  At risk for diabetes mellitus  Class 2 severe obesity with serious comorbidity and body mass index (BMI) of 36.0 to 36.9 in adult, unspecified obesity type ([place])  PLAN:  Vitamin D Deficiency Desiree Dunn was informed that low vitamin D levels contributes to  fatigue and are associated with obesity, breast, and colon cancer. She agrees to continue to take prescription Vit D @50 ,000 IU every week #4 with no refills and will follow up for routine testing of vitamin D, at least 2-3 times per year. She was informed of the risk of over-replacement of vitamin D and agrees to not increase her dose unless she discusses this with [place] first. Desiree Dunn agrees to follow up with our clinic in 2 weeks.  Hypertension We discussed sodium restriction, working on healthy weight loss, and a regular exercise program as the means to achieve improved blood pressure control. Desiree Dunn agreed with this plan and agreed to follow up as directed. We will continue to monitor her blood pressure as well as her progress with the above lifestyle modifications. She will continue her medications as prescribed and will watch for signs of hypotension as she continues her lifestyle modifications.  Insulin Resistance Desiree Dunn will continue to work on weight loss, exercise, and decreasing simple carbohydrates in her diet to help decrease the risk of diabetes. She was informed that eating too many simple carbohydrates or too many calories at one sitting increases the likelihood of GI side effects. Desiree Dunn will use her snacking appropriately. Desiree Dunn agreed to follow up with [place] as directed to monitor her progress.  Diabetes risk counseling Desiree Dunn was given extended (15 minutes) diabetes prevention counseling today. She is 47 y.o. female and has risk factors for diabetes including obesity and insulin resistance. We discussed intensive lifestyle modifications today with an emphasis on weight loss as well as increasing exercise and decreasing simple carbohydrates in her diet.  OBESITY Desiree Dunn is currently in the action stage of change. As such, her goal is to continue with weight loss  efforts She has agreed to follow the Category 2 plan Desiree Dunn has been instructed to work up to a goal of 150 minutes of combined cardio  and strengthening exercise per week for weight loss and overall health benefits. We discussed the following Behavioral Modification Strategies today: increase H20 intake, keeping healthy foods in the home, mindful eating strategies. increasing lean protein intake, decreasing simple carbohydrates, decreasing sodium intake, work on meal planning and easy cooking plans and holiday eating strategies   Desiree Dunn has agreed to follow up with our clinic in 2 weeks fasting. She was informed of the importance of frequent follow up visits to maximize her success with intensive lifestyle modifications for her multiple health conditions.   OBESITY BEHAVIORAL INTERVENTION VISIT  Today's visit was # 4   Starting weight:224 lbs Starting date: 08/27/18 Today's weight : 217 lbs Today's date: 10/23/2018 Total lbs lost to date: 7   ASK: We discussed the diagnosis of obesity with Desiree Dunn today and Desiree Dunn agreed to give [place] permission to discuss obesity behavioral modification therapy today.  ASSESS: Desiree Dunn has the diagnosis of obesity and her BMI today is 37.23 Desiree Dunn is in the action stage of change   ADVISE: Desiree Dunn was educated on the multiple health risks of obesity as well as the benefit of weight loss to improve her health. She was advised of the need for long term treatment and the importance of lifestyle modifications to improve her current health and to decrease her risk of future health problems.  AGREE: Multiple dietary modification options and treatment options were discussed and  Desiree Dunn agreed to follow the recommendations documented in the above note.  ARRANGE: Desiree Dunn was educated on the importance of frequent visits to treat obesity as outlined per CMS and USPSTF guidelines and agreed to schedule her next follow up appointment today.  Corey Skains, am acting as Location manager for [employer]. Owens Shark, DO  I have reviewed the above documentation for accuracy and completeness, and I  agree with the above. -Jearld Lesch, DO

## 2018-10-30 NOTE — Anesthesia Preprocedure Evaluation (Addendum)
Anesthesia Evaluation  Patient identified by MRN, date of birth, ID band Patient awake    Reviewed: Allergy & Precautions, NPO status , Patient's Chart, lab work & pertinent test results  History of Anesthesia Complications Negative for: history of anesthetic complications  Airway Mallampati: I  TM Distance: >3 FB Neck ROM: Full    Dental  (+) Dental Advisory Given, Teeth Intact   Pulmonary sleep apnea and Continuous Positive Airway Pressure Ventilation ,    breath sounds clear to auscultation       Cardiovascular hypertension, Pt. on medications (-) angina Rhythm:Regular Rate:Normal     Neuro/Psych  Headaches, negative psych ROS   GI/Hepatic Neg liver ROS, GERD  Medicated and Controlled,  Endo/Other   Obesity   Renal/GU negative Renal ROS     Musculoskeletal negative musculoskeletal ROS (+)   Abdominal   Peds  Hematology negative hematology ROS (+)   Anesthesia Other Findings Anaphylaxis to Amoxicillin   Reproductive/Obstetrics  S/p tubal ligation                             Anesthesia Physical Anesthesia Plan  ASA: II  Anesthesia Plan: General   Post-op Pain Management:    Induction: Intravenous  PONV Risk Score and Plan: 4 or greater and Treatment may vary due to age or medical condition, Ondansetron, Dexamethasone and Midazolam  Airway Management Planned: Oral ETT  Additional Equipment: None  Intra-op Plan:   Post-operative Plan: Extubation in OR  Informed Consent: I have reviewed the patients History and Physical, chart, labs and discussed the procedure including the risks, benefits and alternatives for the proposed anesthesia with the patient or authorized representative who has indicated his/her understanding and acceptance.   Dental advisory given  Plan Discussed with: CRNA and Anesthesiologist  Anesthesia Plan Comments:        Anesthesia Quick  Evaluation

## 2018-10-31 ENCOUNTER — Ambulatory Visit (HOSPITAL_COMMUNITY): Payer: BLUE CROSS/BLUE SHIELD | Admitting: Anesthesiology

## 2018-10-31 ENCOUNTER — Encounter (HOSPITAL_COMMUNITY)
Admission: RE | Disposition: A | Discharge status: Home / Self Care | Payer: Self-pay | Source: Ambulatory Visit | Attending: Otolaryngology

## 2018-10-31 ENCOUNTER — Ambulatory Visit (HOSPITAL_COMMUNITY)
Admission: RE | Admit: 2018-10-31 | Discharge: 2018-10-31 | Disposition: A | Discharge status: Home / Self Care | Payer: BLUE CROSS/BLUE SHIELD | Source: Ambulatory Visit | Attending: Otolaryngology | Admitting: Otolaryngology

## 2018-10-31 DIAGNOSIS — G4730 Sleep apnea, unspecified: Secondary | ICD-10-CM | POA: Insufficient documentation

## 2018-10-31 DIAGNOSIS — J343 Hypertrophy of nasal turbinates: Secondary | ICD-10-CM | POA: Diagnosis not present

## 2018-10-31 DIAGNOSIS — Z880 Allergy status to penicillin: Secondary | ICD-10-CM | POA: Insufficient documentation

## 2018-10-31 DIAGNOSIS — Z803 Family history of malignant neoplasm of breast: Secondary | ICD-10-CM | POA: Insufficient documentation

## 2018-10-31 DIAGNOSIS — K5900 Constipation, unspecified: Secondary | ICD-10-CM | POA: Insufficient documentation

## 2018-10-31 DIAGNOSIS — J3489 Other specified disorders of nose and nasal sinuses: Secondary | ICD-10-CM | POA: Diagnosis not present

## 2018-10-31 DIAGNOSIS — Z90710 Acquired absence of both cervix and uterus: Secondary | ICD-10-CM | POA: Diagnosis not present

## 2018-10-31 DIAGNOSIS — Z791 Long term (current) use of non-steroidal anti-inflammatories (NSAID): Secondary | ICD-10-CM | POA: Insufficient documentation

## 2018-10-31 DIAGNOSIS — K219 Gastro-esophageal reflux disease without esophagitis: Secondary | ICD-10-CM | POA: Diagnosis not present

## 2018-10-31 DIAGNOSIS — I10 Essential (primary) hypertension: Secondary | ICD-10-CM | POA: Insufficient documentation

## 2018-10-31 DIAGNOSIS — Z8371 Family history of colonic polyps: Secondary | ICD-10-CM | POA: Insufficient documentation

## 2018-10-31 DIAGNOSIS — Z823 Family history of stroke: Secondary | ICD-10-CM | POA: Diagnosis not present

## 2018-10-31 DIAGNOSIS — J342 Deviated nasal septum: Secondary | ICD-10-CM | POA: Diagnosis not present

## 2018-10-31 DIAGNOSIS — E669 Obesity, unspecified: Secondary | ICD-10-CM | POA: Diagnosis not present

## 2018-10-31 DIAGNOSIS — Z8249 Family history of ischemic heart disease and other diseases of the circulatory system: Secondary | ICD-10-CM | POA: Diagnosis not present

## 2018-10-31 DIAGNOSIS — Z833 Family history of diabetes mellitus: Secondary | ICD-10-CM | POA: Diagnosis not present

## 2018-10-31 DIAGNOSIS — Z79899 Other long term (current) drug therapy: Secondary | ICD-10-CM | POA: Insufficient documentation

## 2018-10-31 DIAGNOSIS — Z800 Family history of malignant neoplasm of digestive organs: Secondary | ICD-10-CM | POA: Diagnosis not present

## 2018-10-31 HISTORY — PX: NASAL SEPTOPLASTY W/ TURBINOPLASTY: SHX2070

## 2018-10-31 SURGERY — SEPTOPLASTY, NOSE, WITH NASAL TURBINATE REDUCTION
Anesthesia: General | Laterality: Bilateral

## 2018-10-31 MED ORDER — OXYCODONE HCL 5 MG/5ML PO SOLN: 5.0000 mg | Freq: Once | ORAL | Status: DC | PRN

## 2018-10-31 MED ORDER — LIDOCAINE 2% (20 MG/ML) 5 ML SYRINGE
INTRAMUSCULAR | Status: DC | PRN
  Administered 2018-10-31: 60 mg via INTRAVENOUS

## 2018-10-31 MED ORDER — ONDANSETRON HCL 4 MG/2ML IJ SOLN
INTRAMUSCULAR | Status: DC | PRN
  Administered 2018-10-31: 4 mg via INTRAVENOUS

## 2018-10-31 MED ORDER — CEPHALEXIN 500 MG PO CAPS: 500.0000 mg | ORAL_CAPSULE | Freq: Three times a day (TID) | ORAL | 1 refills | Status: DC

## 2018-10-31 MED ORDER — ROCURONIUM BROMIDE 100 MG/10ML IV SOLN
INTRAVENOUS | Status: DC | PRN
  Administered 2018-10-31: 50 mg via INTRAVENOUS

## 2018-10-31 MED ORDER — CEFAZOLIN SODIUM-DEXTROSE 2-4 GM/100ML-% IV SOLN: 2000.0000 mg | Freq: Once | INTRAVENOUS | Status: DC

## 2018-10-31 MED ORDER — PROPOFOL 10 MG/ML IV BOLUS
INTRAVENOUS | Status: AC
  Filled 2018-10-31: qty 20

## 2018-10-31 MED ORDER — DEXAMETHASONE SODIUM PHOSPHATE 10 MG/ML IJ SOLN
10.0000 mg | Freq: Once | INTRAMUSCULAR | Status: DC
  Filled 2018-10-31: qty 1

## 2018-10-31 MED ORDER — SUGAMMADEX SODIUM 200 MG/2ML IV SOLN
INTRAVENOUS | Status: DC | PRN
  Administered 2018-10-31: 200 mg via INTRAVENOUS

## 2018-10-31 MED ORDER — CHLORHEXIDINE GLUCONATE CLOTH 2 % EX PADS: 6.0000 | MEDICATED_PAD | Freq: Once | CUTANEOUS | Status: DC

## 2018-10-31 MED ORDER — 0.9 % SODIUM CHLORIDE (POUR BTL) OPTIME
TOPICAL | Status: DC | PRN
  Administered 2018-10-31: 1000 mL

## 2018-10-31 MED ORDER — OXYCODONE HCL 5 MG PO TABS: 5.0000 mg | ORAL_TABLET | Freq: Once | ORAL | Status: DC | PRN

## 2018-10-31 MED ORDER — OXYMETAZOLINE HCL 0.05 % NA SOLN
1.0000 | Freq: Two times a day (BID) | NASAL | Status: DC
  Administered 2018-10-31: 1 via NASAL
  Filled 2018-10-31: qty 15

## 2018-10-31 MED ORDER — LIDOCAINE-EPINEPHRINE 1 %-1:100000 IJ SOLN
INTRAMUSCULAR | Status: AC
  Filled 2018-10-31: qty 1

## 2018-10-31 MED ORDER — LACTATED RINGERS IV SOLN
INTRAVENOUS | Status: DC
  Administered 2018-10-31: 08:00:00 via INTRAVENOUS

## 2018-10-31 MED ORDER — PROMETHAZINE HCL 25 MG/ML IJ SOLN: 6.2500 mg | INTRAMUSCULAR | Status: DC | PRN

## 2018-10-31 MED ORDER — FENTANYL CITRATE (PF) 100 MCG/2ML IJ SOLN
INTRAMUSCULAR | Status: AC
  Administered 2018-10-31: 25 ug via INTRAVENOUS
  Filled 2018-10-31: qty 2

## 2018-10-31 MED ORDER — HYDROCODONE-ACETAMINOPHEN 5-325 MG PO TABS: 1.0000 | ORAL_TABLET | Freq: Four times a day (QID) | ORAL | 0 refills | Status: AC | PRN

## 2018-10-31 MED ORDER — OXYMETAZOLINE HCL 0.05 % NA SOLN
NASAL | Status: DC | PRN
  Administered 2018-10-31: 1 via TOPICAL

## 2018-10-31 MED ORDER — DEXAMETHASONE SODIUM PHOSPHATE 10 MG/ML IJ SOLN
10.0000 mg | Freq: Once | INTRAMUSCULAR | Status: AC
  Administered 2018-10-31: 10 mg via INTRAVENOUS

## 2018-10-31 MED ORDER — FENTANYL CITRATE (PF) 250 MCG/5ML IJ SOLN
INTRAMUSCULAR | Status: AC
  Filled 2018-10-31: qty 5

## 2018-10-31 MED ORDER — FENTANYL CITRATE (PF) 100 MCG/2ML IJ SOLN
25.0000 ug | INTRAMUSCULAR | Status: DC | PRN
  Administered 2018-10-31 (×2): 25 ug via INTRAVENOUS

## 2018-10-31 MED ORDER — PROPOFOL 10 MG/ML IV BOLUS
INTRAVENOUS | Status: DC | PRN
  Administered 2018-10-31: 200 mg via INTRAVENOUS

## 2018-10-31 MED ORDER — MUPIROCIN 2 % EX OINT
TOPICAL_OINTMENT | CUTANEOUS | Status: AC
  Filled 2018-10-31: qty 22

## 2018-10-31 MED ORDER — LIDOCAINE-EPINEPHRINE 1 %-1:100000 IJ SOLN
INTRAMUSCULAR | Status: DC | PRN
  Administered 2018-10-31: 4 mL

## 2018-10-31 MED ORDER — CEFAZOLIN SODIUM-DEXTROSE 2-4 GM/100ML-% IV SOLN
2.0000 g | INTRAVENOUS | Status: AC
  Administered 2018-10-31: 2 g via INTRAVENOUS
  Filled 2018-10-31: qty 100

## 2018-10-31 MED ORDER — MIDAZOLAM HCL 5 MG/5ML IJ SOLN
INTRAMUSCULAR | Status: DC | PRN
  Administered 2018-10-31: 2 mg via INTRAVENOUS

## 2018-10-31 MED ORDER — OXYMETAZOLINE HCL 0.05 % NA SOLN
NASAL | Status: AC
  Filled 2018-10-31: qty 15

## 2018-10-31 MED ORDER — MIDAZOLAM HCL 2 MG/2ML IJ SOLN
INTRAMUSCULAR | Status: AC
  Filled 2018-10-31: qty 2

## 2018-10-31 MED ORDER — FENTANYL CITRATE (PF) 100 MCG/2ML IJ SOLN
INTRAMUSCULAR | Status: DC | PRN
  Administered 2018-10-31: 25 ug via INTRAVENOUS
  Administered 2018-10-31: 50 ug via INTRAVENOUS
  Administered 2018-10-31 (×3): 25 ug via INTRAVENOUS
  Administered 2018-10-31: 50 ug via INTRAVENOUS

## 2018-10-31 MED ORDER — MUPIROCIN 2 % EX OINT
TOPICAL_OINTMENT | CUTANEOUS | Status: DC | PRN
  Administered 2018-10-31: 1 via NASAL

## 2018-10-31 SURGICAL SUPPLY — 30 items
BLADE SURG 15 STRL LF DISP TIS (BLADE) ×1 IMPLANT
BLADE SURG 15 STRL SS (BLADE) ×3
CANISTER SUCT 3000ML PPV (MISCELLANEOUS) ×3 IMPLANT
COAGULATOR SUCT 8FR VV (MISCELLANEOUS) IMPLANT
COVER WAND RF STERILE (DRAPES) ×1 IMPLANT
DRAPE HALF SHEET 40X57 (DRAPES) ×2 IMPLANT
ELECT REM PT RETURN 9FT ADLT (ELECTROSURGICAL) ×3
ELECTRODE REM PT RTRN 9FT ADLT (ELECTROSURGICAL) IMPLANT
GAUZE SPONGE 2X2 8PLY STRL LF (GAUZE/BANDAGES/DRESSINGS) ×1 IMPLANT
GLOVE BIOGEL M 7.0 STRL (GLOVE) ×6 IMPLANT
GLOVE BIOGEL PI IND STRL 6.5 (GLOVE) IMPLANT
GLOVE BIOGEL PI INDICATOR 6.5 (GLOVE) ×2
GLOVE SURG SS PI 6.0 STRL IVOR (GLOVE) ×2 IMPLANT
GOWN STRL REUS W/ TWL LRG LVL3 (GOWN DISPOSABLE) ×2 IMPLANT
GOWN STRL REUS W/TWL LRG LVL3 (GOWN DISPOSABLE) ×6
KIT BASIN OR (CUSTOM PROCEDURE TRAY) ×3 IMPLANT
KIT TURNOVER KIT B (KITS) ×3 IMPLANT
NDL HYPO 25GX1X1/2 BEV (NEEDLE) ×1 IMPLANT
NEEDLE HYPO 25GX1X1/2 BEV (NEEDLE) ×3 IMPLANT
NS IRRIG 1000ML POUR BTL (IV SOLUTION) ×3 IMPLANT
PAD ARMBOARD 7.5X6 YLW CONV (MISCELLANEOUS) ×3 IMPLANT
SPLINT NASAL DOYLE BI-VL (GAUZE/BANDAGES/DRESSINGS) ×3 IMPLANT
SPONGE GAUZE 2X2 STER 10/PKG (GAUZE/BANDAGES/DRESSINGS)
SPONGE NEURO XRAY DETECT 1X3 (DISPOSABLE) ×3 IMPLANT
SUT ETHILON 3 0 PS 1 (SUTURE) ×3 IMPLANT
SUT PLAIN 4 0 ~~LOC~~ 1 (SUTURE) ×3 IMPLANT
TOWEL OR 17X24 6PK STRL BLUE (TOWEL DISPOSABLE) ×3 IMPLANT
TRAY ENT MC OR (CUSTOM PROCEDURE TRAY) ×3 IMPLANT
TUBE SALEM SUMP 16 FR W/ARV (TUBING) ×3 IMPLANT
TUBING EXTENTION W/L.L. (IV SETS) ×3 IMPLANT

## 2018-10-31 NOTE — Anesthesia Postprocedure Evaluation (Signed)
Anesthesia Post Note  Patient: Desiree Dunn  Procedure(s) Performed: NASAL SEPTOPLASTY WITH TURBINATE REDUCTION (Bilateral )     Patient location during evaluation: PACU Anesthesia Type: General Level of consciousness: awake and alert Pain management: pain level controlled Vital Signs Assessment: post-procedure vital signs reviewed and stable Respiratory status: spontaneous breathing, nonlabored ventilation and respiratory function stable Cardiovascular status: blood pressure returned to baseline and stable Postop Assessment: no apparent nausea or vomiting Anesthetic complications: no    Last Vitals:  Vitals:   10/31/18 1036 10/31/18 1042  BP: (!) 145/84 (!) 157/79  Pulse: 70 69  Resp: 11 12  Temp: (!) 36.3 C   SpO2: 96% 98%    Last Pain:  Vitals:   10/31/18 1042  PainSc: 0-No pain                 Audry Pili

## 2018-10-31 NOTE — Op Note (Signed)
Operative Note: SEPTOPLASTY AND INFERIOR TURBINATE REDUCTION  Patient: Desiree Dunn  Medical record number: [id number]  Date:10/31/2018  Pre-operative Indications: 1. Deviated nasal septum with nasal airway obstruction     2.  Bilateral inferior turbinate hypertrophy  Postoperative Indications: Same  Surgical Procedure: 1.  Nasal Septoplasty    2.  Bilateral Inferior Turbinate Reduction  Anesthesia: GET  Surgeon: Delsa Bern, M.D.  Complications: None  EBL: 50 cc  Findings: Severely deviated nasal septum with airway obstruction and bilateral inferior turbinate hypertrophy.   Brief History: The patient is a 47 y.o. female with a history of progressive nasal airway obstruction. The patient has been on medical therapy to reduce nasal mucosal edema including saline nasal spray and topical nasal steroids. Despite appropriate medical therapy the patient continues to have ongoing symptoms. Given the patient's history and findings, the above surgical procedures were recommended, risks and benefits were discussed in detail with the patient may understand and agree with our plan for surgery which is scheduled at [facility] under general anesthesia as an outpatient.  Surgical Procedure: The patient is brought to the operating room on 10/31/2018 and placed in supine position on the operating table. General endotracheal anesthesia was established without difficulty. When the patient was adequately anesthetized, surgical timeout was performed and correct identification of the patient and the surgical procedure. The patient's nose was then injected with 5 cc of 1% lidocaine 1 100,000 dilution epinephrine which was injected in a submucosal fashion. The patient's nose was then packed with Afrin-soaked cottonoid pledgets were left in place for approximately 10 minutes lateral vasoconstriction and hemostasis. .  With the patient prepped draped and prepared for surgery, nasal  septoplasty was begun.  A left anterior hemitransfixion incision was created and a mucoperichondrial flap was elevated from anterior to posterior on the left-hand side. The anterior cartilaginous septum was crossed at the midline and a mucoperichondrial flap was elevated on the patient's right.  Swivel knife was then used to resect the anterior and mid cartilaginous portion of the nasal septum.  Resected cartilage was morcellized and returned to the mucoperichondrial pocket at the occlusion of the surgical procedure.  Dissection was then carried out from anterior to posterior removing deviated bone and cartilage including a large septal spur the overlying mucosa was preserved.  With the septum brought to good midline position, the morselized cartilage was returned to the mucoperichondrial pocket and the soft tissue/mucosal flaps were reapproximated with interrupted 4-0 gut suture on a Keith needle in a horizontal mattressing fashion.  Anterior hemitransfixion incision was closed with the same stitch.  Bilateral Doyle nasal septal splints were then placed after the application of Bactroban ointment and sutured in position with a 3-0 Ethilon suture.  Attention was then turned to the inferior turbinates, bilateral inferior turbinate intramural cautery was performed with cautery setting at 75 W.  2 submucosal passes were made in each inferior turbinate.  After completing cautery, anterior vertical incisions were created and overlying soft tissue was elevated, a small amount of turbinate bone was resected.  The turbinates were then outfractured to create a more patent nasal passageway.  Surgical sponge count was correct. An oral gastric tube was passed and the stomach contents were aspirated. Patient was awakened from anesthetic and transferred from the operating room to the recovery room in stable condition. There were no complications and blood loss was 50cc.   Delsa Bern, M.D. [facility]  ENT 10/31/2018

## 2018-10-31 NOTE — H&P (Addendum)
Desiree Dunn is an 47 y.o. female.   Chief Complaint: Nasal Obstruction HPI: Hx of prg nasal obstruction  Past Medical History:  Diagnosis Date  . Allergy   . Back pain   . Chronic RLQ pain    since 1995 when she had ectopic preg  . Constipation   . Deviated septum   . Diverticulitis   . Ectopic pregnancy 1995    x 1 , R side   . GERD (gastroesophageal reflux disease)   . HTN (hypertension) 05/24/2015  . Migraine without aura and without status migrainosus, not intractable   . Miscarriage    x 1  . Obesity, unspecified 07/06/2010  . Pelvic congestion    h/o   . Sleep apnea    uses cpap    Past Surgical History:  Procedure Laterality Date  . ABDOMINAL HYSTERECTOMY  12/2009   [facility]  . BREAST REDUCTION SURGERY  12/09  . COLONOSCOPY  2001   X3 ; diverticulosis  . FOOT SURGERY    . PARTIAL HYSTERECTOMY    . REDUCTION MAMMAPLASTY Bilateral   . RIGHT OOPHORECTOMY  12/2009   [facility]  . TUBAL LIGATION  2000    Family History  Problem Relation Age of Onset  . Colon cancer Mother 41  . Colon cancer Maternal Aunt 56  . Stroke Father   . Diabetes Father   . Hypertension Father   . Hyperlipidemia Father   . Heart disease Father   . Obesity Father   . Hypertension Sister   . Breast cancer Other        GM  . Colon polyps Brother   . Colitis Brother   . Heart attack Neg Hx    Social History:  reports that she has never smoked. She has never used smokeless tobacco. She reports current alcohol use. She reports that she does not use drugs.  Allergies:  Allergies  Allergen Reactions  . Amoxicillin Hives    Hives    Medications Prior to Admission  Medication Sig Dispense Refill  . acetaminophen (TYLENOL) 500 MG tablet Take 1,000 mg by mouth as needed for mild pain or headache.    [name] azelastine (ASTELIN) 0.1 % nasal spray Place 1 spray into both nostrils at bedtime. As needed at bedtime.    . famotidine (PEPCID) 20 MG tablet Take 20 mg by mouth daily.     .  fexofenadine (ALLEGRA) 180 MG tablet Take 180 mg by mouth daily.    . fluticasone (FLONASE) 50 MCG/ACT nasal spray Place 2 sprays into both nostrils daily. 16 g 1  . ibuprofen (ADVIL,MOTRIN) 200 MG tablet Take 400 mg by mouth as needed for moderate pain.    [name] lisinopril (PRINIVIL,ZESTRIL) 10 MG tablet TAKE 1 TABLET BY MOUTH EVERY DAY 90 tablet 0  . Multiple Vitamin (MULTIVITAMIN WITH MINERALS) TABS Take 1 tablet by mouth daily. Reported on 12/06/2015    . omeprazole (PRILOSEC) 20 MG capsule Take 1 capsule (20 mg total) by mouth daily. 30 capsule 6  . verapamil (CALAN-SR) 120 MG CR tablet TAKE 1 TABLET BY MOUTH EVERY DAY 90 tablet 1  . Vitamin D, Ergocalciferol, (DRISDOL) 1.25 MG (50000 UT) CAPS capsule Take 1 capsule (50,000 Units total) by mouth every 7 (seven) days. 4 capsule 0  . traMADol (ULTRAM) 50 MG tablet Take one tablet every 8 hours prn foot pain. 30 tablet 0    No results found for this or any previous visit (from the past 48 hour(s)). No results  found.  Review of Systems  Constitutional: Negative.   HENT: Positive for congestion.   Respiratory: Negative.   Cardiovascular: Negative.     Blood pressure 136/85, pulse 68, temperature 98.1 F (36.7 C), resp. rate 18, SpO2 95 %. Physical Exam  Constitutional: She appears well-developed and well-nourished.  HENT:  Deviated septum  Neck: Normal range of motion. Neck supple.  Cardiovascular: Normal rate.  Respiratory: Effort normal.     Assessment/Plan Adm for OP septoplasty and IT  Jerrell Belfast, MD 10/31/2018, 8:45 AM

## 2018-10-31 NOTE — Transfer of Care (Signed)
Immediate Anesthesia Transfer of Care Note  Patient: Desiree Dunn  Procedure(s) Performed: NASAL SEPTOPLASTY WITH TURBINATE REDUCTION (Bilateral )  Patient Location: PACU  Anesthesia Type:General  Level of Consciousness: awake, alert  and oriented  Airway & Oxygen Therapy: Patient Spontanous Breathing and Patient connected to face mask oxygen  Post-op Assessment: Report given to RN and Post -op Vital signs reviewed and stable  Post vital signs: Reviewed and stable  Last Vitals:  Vitals Value Taken Time  BP 160/99 10/31/2018 10:07 AM  Temp    Pulse 74 10/31/2018 10:08 AM  Resp 17 10/31/2018 10:08 AM  SpO2 100 % 10/31/2018 10:08 AM  Vitals shown include unvalidated device data.  Last Pain: There were no vitals filed for this visit.       Complications: No apparent anesthesia complications

## 2018-10-31 NOTE — Anesthesia Procedure Notes (Signed)
Procedure Name: Intubation Date/Time: 10/31/2018 9:03 AM Performed by: Gwyndolyn Saxon, CRNA Pre-anesthesia Checklist: Patient identified, Emergency Drugs available, Suction available and Patient being monitored Patient Re-evaluated:Patient Re-evaluated prior to induction Oxygen Delivery Method: Circle system utilized Preoxygenation: Pre-oxygenation with 100% oxygen Induction Type: IV induction Ventilation: Mask ventilation without difficulty and Oral airway inserted - appropriate to patient size Laryngoscope Size: Sabra Heck and 2 Grade View: Grade I Tube type: Oral Tube size: 7.0 mm Number of attempts: 1 Airway Equipment and Method: Patient positioned with wedge pillow and Stylet Placement Confirmation: ETT inserted through vocal cords under direct vision,  positive ETCO2 and breath sounds checked- equal and bilateral Secured at: 21 cm Tube secured with: Tape Dental Injury: Teeth and Oropharynx as per pre-operative assessment

## 2018-11-01 ENCOUNTER — Encounter (HOSPITAL_COMMUNITY): Payer: Self-pay | Admitting: Otolaryngology

## 2018-11-05 ENCOUNTER — Encounter: Payer: Self-pay | Admitting: Internal Medicine

## 2018-11-13 ENCOUNTER — Other Ambulatory Visit: Payer: Self-pay | Admitting: Internal Medicine

## 2018-11-13 MED ORDER — FLUCONAZOLE 150 MG PO TABS: 150.0000 mg | ORAL_TABLET | Freq: Every day | ORAL | 0 refills | Status: DC

## 2018-11-17 ENCOUNTER — Encounter (INDEPENDENT_AMBULATORY_CARE_PROVIDER_SITE_OTHER): Payer: Self-pay | Admitting: Bariatrics

## 2018-11-17 ENCOUNTER — Ambulatory Visit (INDEPENDENT_AMBULATORY_CARE_PROVIDER_SITE_OTHER): Payer: BLUE CROSS/BLUE SHIELD | Admitting: Bariatrics

## 2018-11-17 VITALS — BP 125/86 | HR 85 | Temp 98.9°F | Ht 64.0 in | Wt 218.0 lb

## 2018-11-17 DIAGNOSIS — Z9189 Other specified personal risk factors, not elsewhere classified: Secondary | ICD-10-CM | POA: Diagnosis not present

## 2018-11-17 DIAGNOSIS — E559 Vitamin D deficiency, unspecified: Secondary | ICD-10-CM | POA: Diagnosis not present

## 2018-11-17 DIAGNOSIS — I10 Essential (primary) hypertension: Secondary | ICD-10-CM | POA: Diagnosis not present

## 2018-11-17 DIAGNOSIS — Z6837 Body mass index (BMI) 37.0-37.9, adult: Secondary | ICD-10-CM

## 2018-11-17 MED ORDER — VITAMIN D (ERGOCALCIFEROL) 1.25 MG (50000 UNIT) PO CAPS: 50000.0000 [IU] | ORAL_CAPSULE | ORAL | 0 refills | Status: DC

## 2018-11-18 NOTE — Progress Notes (Signed)
Office: [phone]  /  Fax: [phone]   HPI:   Chief Complaint: OBESITY Desiree Dunn is here to discuss her progress with her obesity treatment plan. She is on the Category 2 plan and is following her eating plan approximately 60 % of the time. She states she is walking 3,700 steps per day 5 times per week. Desiree Dunn doing well overall. She gained one pound over the holidays. Her weight is 218 lb (98.9 kg) today and has had a weight gain of 1 pound over a period of 3 to 4 weeks since her last visit. She has lost 6 lbs since starting treatment with [place].  Vitamin D deficiency Desiree Dunn has a diagnosis of vitamin D deficiency. She is currently taking vit D and denies nausea, vomiting or muscle weakness.  Hypertension Desiree Dunn is a 48 y.o. female with hypertension. She is taking Verapamil and Lisinopril. Desiree Dunn denies chest pain or shortness of breath on exertion. She is working weight loss to help control her blood pressure with the goal of decreasing her risk of heart attack and stroke. [name] blood pressure is well controlled.  At risk for cardiovascular disease Desiree Dunn is at a higher than average risk for cardiovascular disease due to obesity and hypertension. She currently denies any chest pain.  ASSESSMENT AND PLAN:  Vitamin D deficiency - Plan: Vitamin D, Ergocalciferol, (DRISDOL) 1.25 MG (50000 UT) CAPS capsule  Essential hypertension  At risk for heart disease  Class 2 severe obesity with serious comorbidity and body mass index (BMI) of 37.0 to 37.9 in adult, unspecified obesity type ([place])  PLAN:  Vitamin D Deficiency Desiree Dunn was informed that low vitamin D levels contributes to fatigue and are associated with obesity, breast, and colon cancer. She agrees to continue to take prescription Vit D @50 ,000 IU every week #4 with no refills and will follow up for routine testing of vitamin D, at least 2-3 times per year. She was informed of the risk of  over-replacement of vitamin D and agrees to not increase her dose unless she discusses this with [place] first. Desiree Dunn agrees to follow up with our clinic in 2 weeks.  Hypertension We discussed sodium restriction, working on healthy weight loss, and a regular exercise program as the means to achieve improved blood pressure control. Desiree Dunn agreed with this plan and agreed to follow up as directed. We will continue to monitor her blood pressure as well as her progress with the above lifestyle modifications. She will continue her medications as prescribed and will watch for signs of hypotension as she continues her lifestyle modifications.  Cardiovascular risk counseling Desiree Dunn was given extended (15 minutes) coronary artery disease prevention counseling today. She is 48 y.o. female and has risk factors for heart disease including obesity and hypertension. We discussed intensive lifestyle modifications today with an emphasis on specific weight loss instructions and strategies. Pt was also informed of the importance of increasing exercise and decreasing saturated fats to help prevent heart disease.  Obesity Desiree Dunn is currently in the action stage of change. As such, her goal is to continue with weight loss efforts She has agreed to follow the Category 2 plan Desiree Dunn has been instructed to continue walking 3,700 steps per day 5 times per week for weight loss and overall health benefits. We discussed the following Behavioral Modification Strategies today: increase H2O intake, no skipping meals, increasing lean protein intake, decreasing simple carbohydrates, increasing vegetables and work on meal planning and easy cooking plans Handouts for homemade seasonings and  snacks were provided to patient today.  Desiree Dunn has agreed to follow up with our clinic in 2 weeks. She was informed of the importance of frequent follow up visits to maximize her success with intensive lifestyle modifications for her multiple health  conditions.  ALLERGIES: Allergies  Allergen Reactions  . Amoxicillin Hives    Hives    MEDICATIONS: Current Outpatient Medications on File Prior to Visit  Medication Sig Dispense Refill  . acetaminophen (TYLENOL) 500 MG tablet Take 1,000 mg by mouth as needed for mild pain or headache.    . cephALEXin (KEFLEX) 500 MG capsule Take 1 capsule (500 mg total) by mouth 3 (three) times daily. 30 capsule 1  . famotidine (PEPCID) 20 MG tablet Take 20 mg by mouth daily.     . fexofenadine (ALLEGRA) 180 MG tablet Take 180 mg by mouth daily.    . fluconazole (DIFLUCAN) 150 MG tablet Take 1 tablet (150 mg total) by mouth daily. 1 tablet 0  . ibuprofen (ADVIL,MOTRIN) 200 MG tablet Take 400 mg by mouth as needed for moderate pain.    [name] lisinopril (PRINIVIL,ZESTRIL) 10 MG tablet TAKE 1 TABLET BY MOUTH EVERY DAY 90 tablet 0  . Multiple Vitamin (MULTIVITAMIN WITH MINERALS) TABS Take 1 tablet by mouth daily. Reported on 12/06/2015    . omeprazole (PRILOSEC) 20 MG capsule Take 1 capsule (20 mg total) by mouth daily. 30 capsule 6  . traMADol (ULTRAM) 50 MG tablet Take one tablet every 8 hours prn foot pain. 30 tablet 0  . verapamil (CALAN-SR) 120 MG CR tablet TAKE 1 TABLET BY MOUTH EVERY DAY 90 tablet 1   No current facility-administered medications on file prior to visit.     PAST MEDICAL HISTORY: Past Medical History:  Diagnosis Date  . Allergy   . Back pain   . Chronic RLQ pain    since 1995 when she had ectopic preg  . Constipation   . Deviated septum   . Diverticulitis   . Ectopic pregnancy 1995    x 1 , R side   . GERD (gastroesophageal reflux disease)   . HTN (hypertension) 05/24/2015  . Migraine without aura and without status migrainosus, not intractable   . Miscarriage    x 1  . Obesity, unspecified 07/06/2010  . Pelvic congestion    h/o   . Sleep apnea    uses cpap    PAST SURGICAL HISTORY: Past Surgical History:  Procedure Laterality Date  . ABDOMINAL HYSTERECTOMY  12/2009    [facility]  . BREAST REDUCTION SURGERY  12/09  . COLONOSCOPY  2001   X3 ; diverticulosis  . FOOT SURGERY    . NASAL SEPTOPLASTY W/ TURBINOPLASTY Bilateral 10/31/2018   Procedure: NASAL SEPTOPLASTY WITH TURBINATE REDUCTION;  Surgeon: Jerrell Belfast, MD;  Location: [facility];  Service: ENT;  Laterality: Bilateral;  . PARTIAL HYSTERECTOMY    . REDUCTION MAMMAPLASTY Bilateral   . RIGHT OOPHORECTOMY  12/2009   [place]  . TUBAL LIGATION  2000    SOCIAL HISTORY: Social History   Tobacco Use  . Smoking status: Never Smoker  . Smokeless tobacco: Never Used  Substance Use Topics  . Alcohol use: Yes    Alcohol/week: 0.0 standard drinks    Comment: socially - 1 drink every 3 months  . Drug use: No    FAMILY HISTORY: Family History  Problem Relation Age of Onset  . Colon cancer Mother 38  . Colon cancer Maternal Aunt 56  . Stroke Father   .  Diabetes Father   . Hypertension Father   . Hyperlipidemia Father   . Heart disease Father   . Obesity Father   . Hypertension Sister   . Breast cancer Other        GM  . Colon polyps Brother   . Colitis Brother   . Heart attack Neg Hx     ROS: Review of Systems  Constitutional: Negative for weight loss.  Respiratory: Negative for shortness of breath (on exertion).   Cardiovascular: Negative for chest pain.  Gastrointestinal: Negative for nausea and vomiting.  Musculoskeletal:       Negative for muscle weakness    PHYSICAL EXAM: Pulse 85, temperature 98.9 F (37.2 C), temperature source Oral, height 5\' 4"  (1.626 m), weight 218 lb (98.9 kg), SpO2 95 %. Body mass index is 37.42 kg/m. Physical Exam Vitals signs reviewed.  Constitutional:      Appearance: Normal appearance. She is well-developed. She is obese.  Cardiovascular:     Rate and Rhythm: Normal rate.  Pulmonary:     Effort: Pulmonary effort is normal.  Musculoskeletal: Normal range of motion.  Skin:    General: Skin is warm and dry.  Neurological:     Mental Status:  She is alert and oriented to person, place, and time.  Psychiatric:        Mood and Affect: Mood normal.        Behavior: Behavior normal.     RECENT LABS AND TESTS: BMET    Component Value Date/Time   NA 140 10/23/2018 0831   K 4.0 10/23/2018 0831   CL 107 10/23/2018 0831   CO2 22 10/23/2018 0831   GLUCOSE 91 10/23/2018 0831   BUN 9 10/23/2018 0831   CREATININE 0.92 10/23/2018 0831   CALCIUM 9.2 10/23/2018 0831   GFRNONAA >60 10/23/2018 0831   GFRAA >60 10/23/2018 0831   Lab Results  Component Value Date   HGBA1C 5.5 08/27/2018   HGBA1C 5.6 01/14/2017   Lab Results  Component Value Date   INSULIN 18.0 08/27/2018   CBC    Component Value Date/Time   WBC 4.9 10/23/2018 0838   RBC 4.52 10/23/2018 0838   HGB 13.2 10/23/2018 0838   HCT 41.8 10/23/2018 0838   PLT 243 10/23/2018 0838   MCV 92.5 10/23/2018 0838   MCH 29.2 10/23/2018 0838   MCHC 31.6 10/23/2018 0838   RDW 13.4 10/23/2018 0838   LYMPHSABS 2.9 06/17/2018 2110   MONOABS 0.8 06/17/2018 2110   EOSABS 0.1 06/17/2018 2110   BASOSABS 0.0 06/17/2018 2110   Iron/TIBC/Ferritin/ %Sat No results found for: IRON, TIBC, FERRITIN, IRONPCTSAT Lipid Panel     Component Value Date/Time   CHOL 205 (H) 08/27/2018 1124   TRIG 105 08/27/2018 1124   HDL 68 08/27/2018 1124   CHOLHDL 3 01/14/2017 1529   VLDL 37.2 01/14/2017 1529   LDLCALC 116 (H) 08/27/2018 1124   Hepatic Function Panel     Component Value Date/Time   PROT 8.2 (H) 06/17/2018 2110   ALBUMIN 4.4 06/17/2018 2110   AST 33 06/17/2018 2110   ALT 32 06/17/2018 2110   ALKPHOS 68 06/17/2018 2110   BILITOT 0.7 06/17/2018 2110   BILIDIR 0.0 04/23/2013 1442      Component Value Date/Time   TSH 1.140 08/27/2018 1124   TSH 0.87 04/12/2016 1515   TSH 0.66 04/23/2013 1442    Ref. Range 08/27/2018 11:24  Vitamin D, 25-Hydroxy Latest Ref Range: 30.0 - 100.0 ng/mL 22.5 (L)  OBESITY BEHAVIORAL INTERVENTION VISIT  Today's visit was # 5   Starting  weight: 224 lbs Starting date: 08/27/2018 Today's weight : 218 lbs Today's date: 11/17/2018 Total lbs lost to date: 6    ASK: We discussed the diagnosis of obesity with [name] today and Cova agreed to give [place] permission to discuss obesity behavioral modification therapy today.  ASSESS: Gabryel has the diagnosis of obesity and her BMI today is 37.4 Catlin is in the action stage of change   ADVISE: Elzabeth was educated on the multiple health risks of obesity as well as the benefit of weight loss to improve her health. She was advised of the need for long term treatment and the importance of lifestyle modifications to improve her current health and to decrease her risk of future health problems.  AGREE: Multiple dietary modification options and treatment options were discussed and  Ailee agreed to follow the recommendations documented in the above note.  ARRANGE: Doriana was educated on the importance of frequent visits to treat obesity as outlined per CMS and USPSTF guidelines and agreed to schedule her next follow up appointment today.  Corey Skains, am acting as Location manager for [employer]. Owens Shark, DO  I have reviewed the above documentation for accuracy and completeness, and I agree with the above. -Jearld Lesch, DO

## 2018-11-21 ENCOUNTER — Ambulatory Visit: Payer: BLUE CROSS/BLUE SHIELD | Admitting: Nurse Practitioner

## 2018-11-21 ENCOUNTER — Encounter: Payer: Self-pay | Admitting: Nurse Practitioner

## 2018-11-21 VITALS — BP 122/78 | HR 84 | Temp 98.2°F | Ht 65.0 in | Wt 222.4 lb

## 2018-11-21 DIAGNOSIS — H6121 Impacted cerumen, right ear: Secondary | ICD-10-CM

## 2018-11-21 DIAGNOSIS — J3089 Other allergic rhinitis: Secondary | ICD-10-CM

## 2018-11-21 DIAGNOSIS — R21 Rash and other nonspecific skin eruption: Secondary | ICD-10-CM | POA: Insufficient documentation

## 2018-11-21 MED ORDER — HYDROCODONE-HOMATROPINE 5-1.5 MG/5ML PO SYRP: 5.0000 mL | ORAL_SOLUTION | Freq: Four times a day (QID) | ORAL | 0 refills | Status: DC | PRN

## 2018-11-21 MED ORDER — PREDNISONE 10 MG (21) PO TBPK: ORAL_TABLET | ORAL | 0 refills | Status: DC

## 2018-11-21 NOTE — Progress Notes (Addendum)
Subjective:     Patient ID: Desiree Dunn , female    DOB: 10-12-1971 , 48 y.o.   MRN: [id number]   Chief Complaint  Patient presents with  . Rash    Patient states she has a rash on her face that goes to her ears and sometimes she has itching. patient states the rash has been there for a few days and she noticed it more after dying her hair.  . Cough    HPI  She dyed her hair on New Year's Day now has rash to face.    She had septoplasty Dec 7th which was successful, has already had post op appt.    Rash  This is a new problem. The current episode started 1 to 4 weeks ago. The problem has been waxing and waning since onset. The affected locations include the face. The rash is characterized by redness, swelling and itchiness. Associated symptoms include coughing. Pertinent negatives include no fatigue, fever or rhinorrhea. (Post nasal drainage) Past treatments include antihistamine (takes allegra). The treatment provided no relief. There is no history of allergies.  Cough  This is a new problem. The current episode started 1 to 4 weeks ago. The problem has been unchanged. Pertinent negatives include no fever, headaches or rhinorrhea. Rash: to face. She has tried steroid inhaler (salt water nasal rinse and flonase) for the symptoms. The treatment provided no relief.     Past Medical History:  Diagnosis Date  . Allergy   . Back pain   . Chronic RLQ pain    since 1995 when she had ectopic preg  . Constipation   . Deviated septum   . Diverticulitis   . Ectopic pregnancy 1995    x 1 , R side   . GERD (gastroesophageal reflux disease)   . HTN (hypertension) 05/24/2015  . Migraine without aura and without status migrainosus, not intractable   . Miscarriage    x 1  . Obesity, unspecified 07/06/2010  . Pelvic congestion    h/o   . Sleep apnea    uses cpap     Family History  Problem Relation Age of Onset  . Colon cancer Mother 1  . Colon cancer Maternal Aunt 91  .  Stroke Father   . Diabetes Father   . Hypertension Father   . Hyperlipidemia Father   . Heart disease Father   . Obesity Father   . Hypertension Sister   . Breast cancer Other        GM  . Colon polyps Brother   . Colitis Brother   . Heart attack Neg Hx      Current Outpatient Medications:  .  acetaminophen (TYLENOL) 500 MG tablet, Take 1,000 mg by mouth as needed for mild pain or headache., Disp: , Rfl:  .  cephALEXin (KEFLEX) 500 MG capsule, Take 1 capsule (500 mg total) by mouth 3 (three) times daily., Disp: 30 capsule, Rfl: 1 .  famotidine (PEPCID) 20 MG tablet, Take 20 mg by mouth daily. , Disp: , Rfl:  .  fexofenadine (ALLEGRA) 180 MG tablet, Take 180 mg by mouth daily., Disp: , Rfl:  .  ibuprofen (ADVIL,MOTRIN) 200 MG tablet, Take 400 mg by mouth as needed for moderate pain., Disp: , Rfl:  .  lisinopril (PRINIVIL,ZESTRIL) 10 MG tablet, TAKE 1 TABLET BY MOUTH EVERY DAY, Disp: 90 tablet, Rfl: 0 .  traMADol (ULTRAM) 50 MG tablet, Take one tablet every 8 hours prn foot pain., Disp: 30 tablet, Rfl:  0 .  verapamil (CALAN-SR) 120 MG CR tablet, TAKE 1 TABLET BY MOUTH EVERY DAY, Disp: 90 tablet, Rfl: 1 .  Vitamin D, Ergocalciferol, (DRISDOL) 1.25 MG (50000 UT) CAPS capsule, Take 1 capsule (50,000 Units total) by mouth every 7 (seven) days., Disp: 4 capsule, Rfl: 0   Allergies  Allergen Reactions  . Amoxicillin Hives    Hives     Review of Systems  Constitutional: Negative for fatigue and fever.  HENT: Negative for rhinorrhea.   Respiratory: Positive for cough.   Skin: Rash: to face.  Neurological: Negative.  Negative for dizziness and headaches.     Today's Vitals   11/21/18 1441  BP: 122/78  Pulse: 84  Temp: 98.2 F (36.8 C)  TempSrc: Oral  SpO2: 98%  Weight: 222 lb 6.4 oz (100.9 kg)  Height: 5\' 5"  (1.651 m)  PainSc: 0-No pain   Body mass index is 37.01 kg/m.   Objective:  Physical Exam Vitals signs reviewed.  Constitutional:      Appearance: Normal  appearance.  HENT:     Head: Normocephalic and atraumatic.     Right Ear: There is impacted cerumen (hard cerumen noted). Tympanic membrane is not bulging.     Left Ear: Tympanic membrane is bulging.  Cardiovascular:     Rate and Rhythm: Normal rate and regular rhythm.     Pulses: Normal pulses.     Heart sounds: Normal heart sounds. No murmur.  Pulmonary:     Effort: Pulmonary effort is normal.     Breath sounds: Normal breath sounds.  Skin:    General: Skin is warm.     Capillary Refill: Capillary refill takes less than 2 seconds.     Findings: Rash (to bilateral sides of face near forehead and cheeks) present.  Neurological:     Mental Status: She is alert.         Assessment And Plan:     1. Rash  Erythematous rash noted to forehead and cheeks, likely allergic reaction to possible hair color  Will treat with prednisone  2. Non-seasonal allergic rhinitis, unspecified trigger  She has nasal congestion and bilateral TM bulging  She is encouraged to use over the counter antihistamine  3. Impacted cerumen of right ear  Hard cerumen to right ear, patient does not complain of any pain or discomfort  Removed cerumen with a lighted curette        Minette Brine, FNP

## 2018-12-01 ENCOUNTER — Ambulatory Visit (INDEPENDENT_AMBULATORY_CARE_PROVIDER_SITE_OTHER): Payer: BLUE CROSS/BLUE SHIELD | Admitting: Bariatrics

## 2018-12-01 ENCOUNTER — Encounter (INDEPENDENT_AMBULATORY_CARE_PROVIDER_SITE_OTHER): Payer: Self-pay | Admitting: Bariatrics

## 2018-12-01 VITALS — BP 104/78 | HR 90 | Temp 98.3°F | Ht 65.0 in | Wt 219.0 lb

## 2018-12-01 DIAGNOSIS — F3289 Other specified depressive episodes: Secondary | ICD-10-CM | POA: Diagnosis not present

## 2018-12-01 DIAGNOSIS — E559 Vitamin D deficiency, unspecified: Secondary | ICD-10-CM

## 2018-12-01 DIAGNOSIS — I10 Essential (primary) hypertension: Secondary | ICD-10-CM | POA: Diagnosis not present

## 2018-12-01 DIAGNOSIS — Z6837 Body mass index (BMI) 37.0-37.9, adult: Secondary | ICD-10-CM

## 2018-12-01 DIAGNOSIS — Z9189 Other specified personal risk factors, not elsewhere classified: Secondary | ICD-10-CM | POA: Diagnosis not present

## 2018-12-01 MED ORDER — BUPROPION HCL ER (SR) 150 MG PO TB12: 150.0000 mg | ORAL_TABLET | Freq: Every day | ORAL | 0 refills | Status: DC

## 2018-12-02 NOTE — Progress Notes (Signed)
Office: [phone]  /  Fax: [phone]   HPI:   Chief Complaint: OBESITY Desiree Dunn is here to discuss her progress with her obesity treatment plan. She is on the Category 2 plan and is following her eating plan approximately 30 % of the time. She states she is walking 3,700 steps 5 times per week. Desiree Dunn is doing well overall. She has been doing some stress eating, especially with job stress. Her weight is 219 lb (99.3 kg) today and has had a weight gain of 1 pound over a period of 2 weeks since her last visit. She has lost 5 lbs since starting treatment with [place].  Vitamin D deficiency Desiree Dunn has a diagnosis of vitamin D deficiency. She is currently taking high dose vit D and denies nausea, vomiting or muscle weakness.  At risk for osteopenia and osteoporosis Desiree Dunn is at higher risk of osteopenia and osteoporosis due to vitamin D deficiency.   Hypertension Desiree Dunn is a 48 y.o. female with hypertension. She is taking Lisinopril and Calan SR. Desiree Dunn denies chest pain or shortness of breath on exertion. She is working weight loss to help control her blood pressure with the goal of decreasing her risk of heart attack and stroke. Desiree Dunn blood pressure is well controlled.  Depression with emotional eating behaviors Desiree Dunn is struggling with emotional eating and using food for comfort to the extent that it is negatively impacting her health. She often snacks when she is not hungry. Desiree Dunn sometimes feels she is out of control and then feels guilty that she made poor food choices. She has been working on behavior modification techniques to help reduce her emotional eating and has been somewhat successful. She shows no sign of suicidal or homicidal ideations.  Depression screen [facility] 2/9 11/21/2018 09/10/2018 08/27/2018 05/07/2016 09/27/2015  Decreased Interest 1 0 3 0 0  Down, Depressed, Hopeless 1 0 2 0 0  PHQ - 2 Score 2 0 5 0 0  Altered sleeping 0 - 3 - -  Tired,  decreased energy 0 - 3 - -  Change in appetite 0 - 1 - -  Feeling bad or failure about yourself  0 - 1 - -  Trouble concentrating 0 - 1 - -  Moving slowly or fidgety/restless 0 - 0 - -  Suicidal thoughts 0 - 0 - -  PHQ-9 Score 2 - 14 - -  Difficult doing work/chores Not difficult at all - - - -     ASSESSMENT AND PLAN:  Vitamin D deficiency  Essential hypertension  Other depression - with emotional eating - Plan: buPROPion (WELLBUTRIN SR) 150 MG 12 hr tablet  At risk for osteoporosis  Class 2 severe obesity with serious comorbidity and body mass index (BMI) of 37.0 to 37.9 in adult, unspecified obesity type (HCC)  PLAN:  Vitamin D Deficiency Desiree Dunn was informed that low vitamin D levels contributes to fatigue and are associated with obesity, breast, and colon cancer. She agrees to continue to take prescription Vit D @50 ,000 IU every week and will follow up for routine testing of vitamin D, at least 2-3 times per year. She was informed of the risk of over-replacement of vitamin D and agrees to not increase her dose unless she discusses this with [place] first.  At risk for osteopenia and osteoporosis Desiree Dunn was given extended  (15 minutes) osteoporosis prevention counseling today. Desiree Dunn is at risk for osteopenia and osteoporosis due to her vitamin D deficiency. She was encouraged to take her vitamin  D and follow her higher calcium diet and increase strengthening exercise to help strengthen her bones and decrease her risk of osteopenia and osteoporosis.  Hypertension We discussed sodium restriction, working on healthy weight loss, and a regular exercise program as the means to achieve improved blood pressure control. Desiree Dunn agreed with this plan and agreed to follow up as directed. We will continue to monitor her blood pressure as well as her progress with the above lifestyle modifications. She will continue her medications as prescribed and will watch for signs of hypotension as she continues  her lifestyle modifications.  Depression with Emotional Eating Behaviors We discussed behavior modification techniques today to help Desiree Dunn deal with her emotional eating and depression. We will refer to Dr. Mallie Mussel our bariatric psychologist. Desiree Dunn agrees to take Wellbutrin 150 mg once daily #30 with no refills.  Obesity Desiree Dunn is currently in the action stage of change. As such, her goal is to continue with weight loss efforts She has agreed to follow the Category 2 plan Desiree Dunn has been instructed to work up to a goal of 150 minutes of combined cardio and strengthening exercise per week for weight loss and overall health benefits. We discussed the following Behavioral Modification Strategies today: increase H2O intake, keeping healthy foods in the home, increasing lean protein intake, decreasing simple carbohydrates, increasing vegetables and work on meal planning and easy cooking plans  Desiree Dunn has agreed to follow up with our clinic in 2 weeks. She was informed of the importance of frequent follow up visits to maximize her success with intensive lifestyle modifications for her multiple health conditions.  ALLERGIES: Allergies  Allergen Reactions  . Amoxicillin Hives    Hives    MEDICATIONS: Current Outpatient Medications on File Prior to Visit  Medication Sig Dispense Refill  . acetaminophen (TYLENOL) 500 MG tablet Take 1,000 mg by mouth as needed for mild pain or headache.    . cephALEXin (KEFLEX) 500 MG capsule Take 1 capsule (500 mg total) by mouth 3 (three) times daily. 30 capsule 1  . famotidine (PEPCID) 20 MG tablet Take 20 mg by mouth daily.     . fexofenadine (ALLEGRA) 180 MG tablet Take 180 mg by mouth daily.    [name] HYDROcodone-homatropine (HYDROMET) 5-1.5 MG/5ML syrup Take 5 mLs by mouth every 6 (six) hours as needed for cough. 120 mL 0  . ibuprofen (ADVIL,MOTRIN) 200 MG tablet Take 400 mg by mouth as needed for moderate pain.    [name] lisinopril (PRINIVIL,ZESTRIL) 10 MG tablet TAKE 1  TABLET BY MOUTH EVERY DAY 90 tablet 0  . predniSONE (STERAPRED UNI-PAK 21 TAB) 10 MG (21) TBPK tablet Take as directed 21 tablet 0  . traMADol (ULTRAM) 50 MG tablet Take one tablet every 8 hours prn foot pain. 30 tablet 0  . verapamil (CALAN-SR) 120 MG CR tablet TAKE 1 TABLET BY MOUTH EVERY DAY 90 tablet 1  . Vitamin D, Ergocalciferol, (DRISDOL) 1.25 MG (50000 UT) CAPS capsule Take 1 capsule (50,000 Units total) by mouth every 7 (seven) days. 4 capsule 0   No current facility-administered medications on file prior to visit.     PAST MEDICAL HISTORY: Past Medical History:  Diagnosis Date  . Allergy   . Back pain   . Chronic RLQ pain    since 1995 when she had ectopic preg  . Constipation   . Deviated septum   . Diverticulitis   . Ectopic pregnancy 1995    x 1 , R side   . GERD (  gastroesophageal reflux disease)   . HTN (hypertension) 05/24/2015  . Migraine without aura and without status migrainosus, not intractable   . Miscarriage    x 1  . Obesity, unspecified 07/06/2010  . Pelvic congestion    h/o   . Sleep apnea    uses cpap    PAST SURGICAL HISTORY: Past Surgical History:  Procedure Laterality Date  . ABDOMINAL HYSTERECTOMY  12/2009   [facility]  . BREAST REDUCTION SURGERY  12/09  . COLONOSCOPY  2001   X3 ; diverticulosis  . FOOT SURGERY    . NASAL SEPTOPLASTY W/ TURBINOPLASTY Bilateral 10/31/2018   Procedure: NASAL SEPTOPLASTY WITH TURBINATE REDUCTION;  Surgeon: Jerrell Belfast, MD;  Location: [facility];  Service: ENT;  Laterality: Bilateral;  . PARTIAL HYSTERECTOMY    . REDUCTION MAMMAPLASTY Bilateral   . RIGHT OOPHORECTOMY  12/2009   [facility]  . TUBAL LIGATION  2000    SOCIAL HISTORY: Social History   Tobacco Use  . Smoking status: Never Smoker  . Smokeless tobacco: Never Used  Substance Use Topics  . Alcohol use: Yes    Alcohol/week: 0.0 standard drinks    Comment: socially - 1 drink every 3 months  . Drug use: No    FAMILY HISTORY: Family History    Problem Relation Age of Onset  . Colon cancer Mother 70  . Colon cancer Maternal Aunt 77  . Stroke Father   . Diabetes Father   . Hypertension Father   . Hyperlipidemia Father   . Heart disease Father   . Obesity Father   . Hypertension Sister   . Breast cancer Other        GM  . Colon polyps Brother   . Colitis Brother   . Heart attack Neg Hx     ROS: Review of Systems  Constitutional: Negative for weight loss.  Respiratory: Negative for shortness of breath (on exertion).   Cardiovascular: Negative for chest pain.  Gastrointestinal: Negative for nausea and vomiting.  Musculoskeletal:       Negative for muscle weakness  Psychiatric/Behavioral: Positive for depression. Negative for suicidal ideas.    PHYSICAL EXAM: Blood pressure 104/78, pulse 90, temperature 98.3 F (36.8 C), temperature source Oral, height 5\' 5"  (1.651 m), weight 219 lb (99.3 kg), SpO2 98 %. Body mass index is 36.44 kg/m. Physical Exam Vitals signs reviewed.  Constitutional:      Appearance: Normal appearance. She is well-developed. She is obese.  Cardiovascular:     Rate and Rhythm: Normal rate.  Pulmonary:     Effort: Pulmonary effort is normal.  Musculoskeletal: Normal range of motion.  Skin:    General: Skin is warm and dry.  Neurological:     Mental Status: She is alert and oriented to person, place, and time.  Psychiatric:        Mood and Affect: Mood normal.        Behavior: Behavior normal.        Thought Content: Thought content does not include homicidal or suicidal ideation.     RECENT LABS AND TESTS: BMET    Component Value Date/Time   NA 140 10/23/2018 0831   K 4.0 10/23/2018 0831   CL 107 10/23/2018 0831   CO2 22 10/23/2018 0831   GLUCOSE 91 10/23/2018 0831   BUN 9 10/23/2018 0831   CREATININE 0.92 10/23/2018 0831   CALCIUM 9.2 10/23/2018 0831   GFRNONAA >60 10/23/2018 0831   GFRAA >60 10/23/2018 0831   Lab Results  Component Value Date   HGBA1C 5.5 08/27/2018    HGBA1C 5.6 01/14/2017   Lab Results  Component Value Date   INSULIN 18.0 08/27/2018   CBC    Component Value Date/Time   WBC 4.9 10/23/2018 0838   RBC 4.52 10/23/2018 0838   HGB 13.2 10/23/2018 0838   HCT 41.8 10/23/2018 0838   PLT 243 10/23/2018 0838   MCV 92.5 10/23/2018 0838   MCH 29.2 10/23/2018 0838   MCHC 31.6 10/23/2018 0838   RDW 13.4 10/23/2018 0838   LYMPHSABS 2.9 06/17/2018 2110   MONOABS 0.8 06/17/2018 2110   EOSABS 0.1 06/17/2018 2110   BASOSABS 0.0 06/17/2018 2110   Iron/TIBC/Ferritin/ %Sat No results found for: IRON, TIBC, FERRITIN, IRONPCTSAT Lipid Panel     Component Value Date/Time   CHOL 205 (H) 08/27/2018 1124   TRIG 105 08/27/2018 1124   HDL 68 08/27/2018 1124   CHOLHDL 3 01/14/2017 1529   VLDL 37.2 01/14/2017 1529   LDLCALC 116 (H) 08/27/2018 1124   Hepatic Function Panel     Component Value Date/Time   PROT 8.2 (H) 06/17/2018 2110   ALBUMIN 4.4 06/17/2018 2110   AST 33 06/17/2018 2110   ALT 32 06/17/2018 2110   ALKPHOS 68 06/17/2018 2110   BILITOT 0.7 06/17/2018 2110   BILIDIR 0.0 04/23/2013 1442      Component Value Date/Time   TSH 1.140 08/27/2018 1124   TSH 0.87 04/12/2016 1515   TSH 0.66 04/23/2013 1442     Ref. Range 08/27/2018 11:24  Vitamin D, 25-Hydroxy Latest Ref Range: 30.0 - 100.0 ng/mL 22.5 (L)     OBESITY BEHAVIORAL INTERVENTION VISIT  Today's visit was # 6   Starting weight: 224 lbs Starting date: 08/27/2018 Today's weight : 219 lbs Today's date: 12/01/2018 Total lbs lost to date: 5   ASK: We discussed the diagnosis of obesity with [name] today and Leola agreed to give [place] permission to discuss obesity behavioral modification therapy today.  ASSESS: Rylea has the diagnosis of obesity and her BMI today is 36.44 Lynnel is in the action stage of change   ADVISE: Makenzey was educated on the multiple health risks of obesity as well as the benefit of weight loss to improve her health. She was  advised of the need for long term treatment and the importance of lifestyle modifications to improve her current health and to decrease her risk of future health problems.  AGREE: Multiple dietary modification options and treatment options were discussed and  Alfretta agreed to follow the recommendations documented in the above note.  ARRANGE: Nargis was educated on the importance of frequent visits to treat obesity as outlined per CMS and USPSTF guidelines and agreed to schedule her next follow up appointment today.  Corey Skains, am acting as Location manager for [employer]. Owens Shark, DO  I have reviewed the above documentation for accuracy and completeness, and I agree with the above. -Jearld Lesch, DO

## 2018-12-09 ENCOUNTER — Encounter: Payer: Self-pay | Admitting: Nurse Practitioner
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
## 2018-12-21 ENCOUNTER — Other Ambulatory Visit (INDEPENDENT_AMBULATORY_CARE_PROVIDER_SITE_OTHER): Payer: Self-pay | Admitting: Bariatrics

## 2018-12-21 DIAGNOSIS — F3289 Other specified depressive episodes: Secondary | ICD-10-CM

## 2018-12-22 ENCOUNTER — Encounter (INDEPENDENT_AMBULATORY_CARE_PROVIDER_SITE_OTHER): Payer: Self-pay | Admitting: Bariatrics

## 2018-12-22 ENCOUNTER — Ambulatory Visit (INDEPENDENT_AMBULATORY_CARE_PROVIDER_SITE_OTHER): Payer: BLUE CROSS/BLUE SHIELD | Admitting: Psychology

## 2018-12-22 ENCOUNTER — Ambulatory Visit (INDEPENDENT_AMBULATORY_CARE_PROVIDER_SITE_OTHER): Payer: BLUE CROSS/BLUE SHIELD | Admitting: Bariatrics

## 2018-12-22 ENCOUNTER — Ambulatory Visit: Payer: BLUE CROSS/BLUE SHIELD | Admitting: Internal Medicine

## 2018-12-22 VITALS — BP 121/64 | HR 90 | Temp 98.5°F | Ht 65.0 in | Wt 222.0 lb

## 2018-12-22 DIAGNOSIS — I10 Essential (primary) hypertension: Secondary | ICD-10-CM | POA: Diagnosis not present

## 2018-12-22 DIAGNOSIS — F3289 Other specified depressive episodes: Secondary | ICD-10-CM

## 2018-12-22 DIAGNOSIS — E559 Vitamin D deficiency, unspecified: Secondary | ICD-10-CM | POA: Diagnosis not present

## 2018-12-22 DIAGNOSIS — Z6837 Body mass index (BMI) 37.0-37.9, adult: Secondary | ICD-10-CM

## 2018-12-23 ENCOUNTER — Encounter (INDEPENDENT_AMBULATORY_CARE_PROVIDER_SITE_OTHER): Payer: Self-pay

## 2018-12-23 NOTE — Progress Notes (Signed)
Office: [phone]  /  Fax: [phone]   HPI:   Chief Complaint: OBESITY Desiree Dunn is here to discuss her progress with her obesity treatment plan. She is on the Category 2 plan and is following her eating plan approximately 20 % of the time. She states she is exercising 0 minutes 0 times per week. Desiree Dunn has done well overall, but she has struggled as she has been in [employer] [facility] for work. Her weight is 222 lb (100.7 kg) today and has had a weight gain of 3 pounds over a period of 3 weeks since her last visit. She has lost 2 lbs since starting treatment with [place].  Vitamin D deficiency Desiree Dunn has a diagnosis of vitamin D deficiency. She is currently taking high dose vit D and denies nausea, vomiting or muscle weakness.  Hypertension Desiree Dunn is a 48 y.o. female with hypertension. She is taking Lisinopril and Calan. Desiree Dunn denies chest pain. She is working weight loss to help control her blood pressure with the goal of decreasing her risk of heart attack and stroke. [name] blood pressure is well controlled.  Depression with emotional eating behaviors Desiree Dunn was prescribed Wellbutrin, but she is not taking Wellbutrin at this time. She is concerned about problems, if she drinks alcohol. Desiree Dunn struggles with emotional eating and using food for comfort to the extent that it is negatively impacting her health. She often snacks when she is not hungry. Desiree Dunn sometimes feels she is out of control and then feels guilty that she made poor food choices. She has been working on behavior modification techniques to help reduce her emotional eating and has been somewhat successful. She shows no sign of suicidal or homicidal ideations.  Depression screen Desiree Dunn 2/9 12/22/2018 11/21/2018 09/10/2018 08/27/2018 05/07/2016  Decreased Interest 2 1 0 3 0  Down, Depressed, Hopeless 2 1 0 2 0  PHQ - 2 Score 4 2 0 5 0  Altered sleeping 1 0 - 3 -  Tired, decreased energy 3 0 - 3 -  Change in  appetite 3 0 - 1 -  Feeling bad or failure about yourself  3 0 - 1 -  Trouble concentrating 1 0 - 1 -  Moving slowly or fidgety/restless 0 0 - 0 -  Suicidal thoughts 0 0 - 0 -  PHQ-9 Score 15 2 - 14 -  Difficult doing work/chores - Not difficult at all - - -     ASSESSMENT AND PLAN:  Vitamin D deficiency  Essential hypertension  Other depression - with emotional eating  Class 2 severe obesity with serious comorbidity and body mass index (BMI) of 37.0 to 37.9 in adult, unspecified obesity type (HCC)  PLAN:  Vitamin D Deficiency Desiree Dunn was informed that low vitamin D levels contributes to fatigue and are associated with obesity, breast, and colon cancer. She agrees to continue to take prescription Vit D @50 ,000 IU every week and will follow up for routine testing of vitamin D, at least 2-3 times per year. She was informed of the risk of over-replacement of vitamin D and agrees to not increase her dose unless she discusses this with [place] first.  Hypertension We discussed sodium restriction, working on healthy weight loss, and a regular exercise program as the means to achieve improved blood pressure control. Desiree Dunn agreed with this plan and agreed to follow up as directed. We will continue to monitor her blood pressure as well as her progress with the above lifestyle modifications. She will continue her medications  as prescribed and will watch for signs of hypotension as she continues her lifestyle modifications.  Depression with Emotional Eating Behaviors We discussed behavior modification techniques today to help Desiree Dunn deal with her emotional eating and depression. Desiree Dunn will follow up with Dr. Mallie Mussel. She has an prescription for Wellbutrin at home. Desiree Dunn  agreed to follow up as directed.  I spent > than 50% of the 15 minute visit on counseling as documented in the note.  Obesity Desiree Dunn is currently in the action stage of change. As such, her goal is to continue with weight loss  efforts She has agreed to follow the Category 2 plan Desiree Dunn has been instructed to work up to a goal of 150 minutes of combined cardio and strengthening exercise per week for weight loss and overall health benefits. We discussed the following Behavioral Modification Strategies today: increase H2O intake, no skipping meals, increasing lean protein intake, decreasing simple carbohydrates, increasing vegetables and work on meal planning and easy cooking plans Desiree Dunn will take good foods to work and she will pack her lunch.  Desiree Dunn has agreed to follow up with our clinic in 2 weeks. She was informed of the importance of frequent follow up visits to maximize her success with intensive lifestyle modifications for her multiple health conditions.  ALLERGIES: Allergies  Allergen Reactions  . Amoxicillin Hives    Hives    MEDICATIONS: Current Outpatient Medications on File Prior to Visit  Medication Sig Dispense Refill  . acetaminophen (TYLENOL) 500 MG tablet Take 1,000 mg by mouth as needed for mild pain or headache.    [name] buPROPion (WELLBUTRIN SR) 150 MG 12 hr tablet Take 1 tablet (150 mg total) by mouth daily. 30 tablet 0  . famotidine (PEPCID) 20 MG tablet Take 20 mg by mouth daily.     . fexofenadine (ALLEGRA) 180 MG tablet Take 180 mg by mouth daily.    [name] HYDROcodone-homatropine (HYDROMET) 5-1.5 MG/5ML syrup Take 5 mLs by mouth every 6 (six) hours as needed for cough. 120 mL 0  . ibuprofen (ADVIL,MOTRIN) 200 MG tablet Take 400 mg by mouth as needed for moderate pain.    [name] lisinopril (PRINIVIL,ZESTRIL) 10 MG tablet TAKE 1 TABLET BY MOUTH EVERY DAY 90 tablet 0  . traMADol (ULTRAM) 50 MG tablet Take one tablet every 8 hours prn foot pain. 30 tablet 0  . verapamil (CALAN-SR) 120 MG CR tablet TAKE 1 TABLET BY MOUTH EVERY DAY 90 tablet 1  . Vitamin D, Ergocalciferol, (DRISDOL) 1.25 MG (50000 UT) CAPS capsule Take 1 capsule (50,000 Units total) by mouth every 7 (seven) days. 4 capsule 0   No current  facility-administered medications on file prior to visit.     PAST MEDICAL HISTORY: Past Medical History:  Diagnosis Date  . Allergy   . Back pain   . Chronic RLQ pain    since 1995 when she had ectopic preg  . Constipation   . Deviated septum   . Diverticulitis   . Ectopic pregnancy 1995    x 1 , R side   . GERD (gastroesophageal reflux disease)   . HTN (hypertension) 05/24/2015  . Migraine without aura and without status migrainosus, not intractable   . Miscarriage    x 1  . Obesity, unspecified 07/06/2010  . Pelvic congestion    h/o   . Sleep apnea    uses cpap    PAST SURGICAL HISTORY: Past Surgical History:  Procedure Laterality Date  . ABDOMINAL HYSTERECTOMY  12/2009  [place]  . BREAST REDUCTION SURGERY  12/09  . COLONOSCOPY  2001   X3 ; diverticulosis  . FOOT SURGERY    . NASAL SEPTOPLASTY W/ TURBINOPLASTY Bilateral 10/31/2018   Procedure: NASAL SEPTOPLASTY WITH TURBINATE REDUCTION;  Surgeon: Jerrell Belfast, MD;  Location: [facility];  Service: ENT;  Laterality: Bilateral;  . PARTIAL HYSTERECTOMY    . REDUCTION MAMMAPLASTY Bilateral   . RIGHT OOPHORECTOMY  12/2009   [place]  . TUBAL LIGATION  2000    SOCIAL HISTORY: Social History   Tobacco Use  . Smoking status: Never Smoker  . Smokeless tobacco: Never Used  Substance Use Topics  . Alcohol use: Yes    Alcohol/week: 0.0 standard drinks    Comment: socially - 1 drink every 3 months  . Drug use: No    FAMILY HISTORY: Family History  Problem Relation Age of Onset  . Colon cancer Mother 65  . Colon cancer Maternal Aunt 39  . Stroke Father   . Diabetes Father   . Hypertension Father   . Hyperlipidemia Father   . Heart disease Father   . Obesity Father   . Hypertension Sister   . Breast cancer Other        GM  . Colon polyps Brother   . Colitis Brother   . Heart attack Neg Hx     ROS: Review of Systems  Constitutional: Negative for weight loss.  Cardiovascular: Negative for chest pain.    Gastrointestinal: Negative for nausea and vomiting.  Musculoskeletal:       Negative for muscle weakness  Psychiatric/Behavioral: Positive for depression. Negative for suicidal ideas.    PHYSICAL EXAM: Blood pressure 121/64, pulse 90, temperature 98.5 F (36.9 C), temperature source Oral, height 5\' 5"  (1.651 m), weight 222 lb (100.7 kg), SpO2 97 %. Body mass index is 36.94 kg/m. Physical Exam Vitals signs reviewed.  Constitutional:      Appearance: Normal appearance. She is well-developed. She is obese.  Cardiovascular:     Rate and Rhythm: Normal rate.  Pulmonary:     Effort: Pulmonary effort is normal.  Musculoskeletal: Normal range of motion.  Skin:    General: Skin is warm and dry.  Neurological:     Mental Status: She is alert and oriented to person, place, and time.  Psychiatric:        Mood and Affect: Mood normal.        Behavior: Behavior normal.        Thought Content: Thought content does not include homicidal or suicidal ideation.     RECENT LABS AND TESTS: BMET    Component Value Date/Time   NA 140 10/23/2018 0831   K 4.0 10/23/2018 0831   CL 107 10/23/2018 0831   CO2 22 10/23/2018 0831   GLUCOSE 91 10/23/2018 0831   BUN 9 10/23/2018 0831   CREATININE 0.92 10/23/2018 0831   CALCIUM 9.2 10/23/2018 0831   GFRNONAA >60 10/23/2018 0831   GFRAA >60 10/23/2018 0831   Lab Results  Component Value Date   HGBA1C 5.5 08/27/2018   HGBA1C 5.6 01/14/2017   Lab Results  Component Value Date   INSULIN 18.0 08/27/2018   CBC    Component Value Date/Time   WBC 4.9 10/23/2018 0838   RBC 4.52 10/23/2018 0838   HGB 13.2 10/23/2018 0838   HCT 41.8 10/23/2018 0838   PLT 243 10/23/2018 0838   MCV 92.5 10/23/2018 0838   MCH 29.2 10/23/2018 0838   MCHC 31.6 10/23/2018 6203  RDW 13.4 10/23/2018 0838   LYMPHSABS 2.9 06/17/2018 2110   MONOABS 0.8 06/17/2018 2110   EOSABS 0.1 06/17/2018 2110   BASOSABS 0.0 06/17/2018 2110   Iron/TIBC/Ferritin/ %Sat No  results found for: IRON, TIBC, FERRITIN, IRONPCTSAT Lipid Panel     Component Value Date/Time   CHOL 205 (H) 08/27/2018 1124   TRIG 105 08/27/2018 1124   HDL 68 08/27/2018 1124   CHOLHDL 3 01/14/2017 1529   VLDL 37.2 01/14/2017 1529   LDLCALC 116 (H) 08/27/2018 1124   Hepatic Function Panel     Component Value Date/Time   PROT 8.2 (H) 06/17/2018 2110   ALBUMIN 4.4 06/17/2018 2110   AST 33 06/17/2018 2110   ALT 32 06/17/2018 2110   ALKPHOS 68 06/17/2018 2110   BILITOT 0.7 06/17/2018 2110   BILIDIR 0.0 04/23/2013 1442      Component Value Date/Time   TSH 1.140 08/27/2018 1124   TSH 0.87 04/12/2016 1515   TSH 0.66 04/23/2013 1442     Ref. Range 08/27/2018 11:24  Vitamin D, 25-Hydroxy Latest Ref Range: 30.0 - 100.0 ng/mL 22.5 (L)     OBESITY BEHAVIORAL INTERVENTION VISIT  Today's visit was # 7   Starting weight: 224 lbs Starting date: 08/27/2018 Today's weight : 222 lbs  Today's date: 12/22/2018 Total lbs lost to date: 2   ASK: We discussed the diagnosis of obesity with Desiree Dunn today and Desiree Dunn agreed to give [place] permission to discuss obesity behavioral modification therapy today.  ASSESS: Desiree Dunn has the diagnosis of obesity and her BMI today is 36.94 Desiree Dunn is in the action stage of change   ADVISE: Desiree Dunn was educated on the multiple health risks of obesity as well as the benefit of weight loss to improve her health. She was advised of the need for long term treatment and the importance of lifestyle modifications to improve her current health and to decrease her risk of future health problems.  AGREE: Multiple dietary modification options and treatment options were discussed and  Tandrea agreed to follow the recommendations documented in the above note.  ARRANGE: Desiree Dunn was educated on the importance of frequent visits to treat obesity as outlined per CMS and USPSTF guidelines and agreed to schedule her next follow up appointment today.  Corey Skains, am acting as Location manager for [employer]. Owens Shark, DO  I have reviewed the above documentation for accuracy and completeness, and I agree with the above. -Jearld Lesch, DO

## 2018-12-28 ENCOUNTER — Other Ambulatory Visit: Payer: Self-pay | Admitting: Internal Medicine

## 2019-01-06 ENCOUNTER — Ambulatory Visit (INDEPENDENT_AMBULATORY_CARE_PROVIDER_SITE_OTHER): Payer: BLUE CROSS/BLUE SHIELD | Admitting: Bariatrics

## 2019-01-06 ENCOUNTER — Encounter (INDEPENDENT_AMBULATORY_CARE_PROVIDER_SITE_OTHER): Payer: Self-pay | Admitting: Bariatrics

## 2019-01-06 VITALS — BP 133/79 | HR 91 | Temp 98.4°F | Ht 65.0 in | Wt 220.0 lb

## 2019-01-06 DIAGNOSIS — Z6836 Body mass index (BMI) 36.0-36.9, adult: Secondary | ICD-10-CM

## 2019-01-06 DIAGNOSIS — F3289 Other specified depressive episodes: Secondary | ICD-10-CM | POA: Diagnosis not present

## 2019-01-06 DIAGNOSIS — I10 Essential (primary) hypertension: Secondary | ICD-10-CM

## 2019-01-06 DIAGNOSIS — E559 Vitamin D deficiency, unspecified: Secondary | ICD-10-CM | POA: Diagnosis not present

## 2019-01-06 NOTE — Progress Notes (Signed)
Office: [phone]  /  Fax: [phone]   HPI:   Chief Complaint: OBESITY Desiree Dunn is here to discuss her progress with her obesity treatment plan. She is on the Category 2 plan and is following her eating plan approximately 60 % of the time. She states she is exercising 0 minutes 0 times per week. Desiree Dunn has done well over the last two weeks. Her spouse bought her a "stationary bike". Desiree Dunn is getting adequate fiber, vegetables and water. Her weight is 220 lb (99.8 kg) today and has had a weight loss of 2 pounds over a period of 2 weeks since her last visit. She has lost 4 lbs since starting treatment with [place].  Vitamin D deficiency Desiree Dunn has a diagnosis of vitamin D deficiency. She is currently taking high dose vit D and denies nausea, vomiting or muscle weakness.  Hypertension Desiree Dunn is a 48 y.o. female with hypertension. She is taking Lisinopril and Calan. Desiree Dunn denies chest pain or shortness of breath on exertion. She is working weight loss to help control her blood pressure with the goal of decreasing her risk of heart attack and stroke. Desiree Dunn blood pressure is well controlled.  Depression with emotional eating behaviors Desiree Dunn was prescribed Wellbutrin, but she did not start the Wellbutrin. Desiree Dunn struggles with emotional eating and using food for comfort to the extent that it is negatively impacting her health. She often snacks when she is not hungry. Desiree Dunn sometimes feels she is out of control and then feels guilty that she made poor food choices. She has been working on behavior modification techniques to help reduce her emotional eating and has been somewhat successful. She shows no sign of suicidal or homicidal ideations.  Depression screen [facility] 2/9 12/22/2018 11/21/2018 09/10/2018 08/27/2018 05/07/2016  Decreased Interest 2 1 0 3 0  Down, Depressed, Hopeless 2 1 0 2 0  PHQ - 2 Score 4 2 0 5 0  Altered sleeping 1 0 - 3 -  Tired, decreased energy 3 0 -  3 -  Change in appetite 3 0 - 1 -  Feeling bad or failure about yourself  3 0 - 1 -  Trouble concentrating 1 0 - 1 -  Moving slowly or fidgety/restless 0 0 - 0 -  Suicidal thoughts 0 0 - 0 -  PHQ-9 Score 15 2 - 14 -  Difficult doing work/chores - Not difficult at all - - -    ASSESSMENT AND PLAN:  Vitamin D deficiency  Essential hypertension  Other depression - with emotional eating  Class 2 severe obesity with serious comorbidity and body mass index (BMI) of 36.0 to 36.9 in adult, unspecified obesity type (HCC)  PLAN:  Vitamin D Deficiency Desiree Dunn was informed that low vitamin D levels contributes to fatigue and are associated with obesity, breast, and colon cancer. She agrees to continue to take prescription Vit D @50 ,000 IU every week and will follow up for routine testing of vitamin D, at least 2-3 times per year. She was informed of the risk of over-replacement of vitamin D and agrees to not increase her dose unless she discusses this with [place] first.  Hypertension We discussed sodium restriction, working on healthy weight loss, and a regular exercise program as the means to achieve improved blood pressure control. Desiree Dunn agreed with this plan and agreed to follow up as directed. We will continue to monitor her blood pressure as well as her progress with the above lifestyle modifications. She will continue her medications  as prescribed and will watch for signs of hypotension as she continues her lifestyle modifications.  Depression with Emotional Eating Behaviors We discussed behavior modification techniques today to help Desiree Dunn deal with her emotional eating and depression. She has agreed to follow up as directed.  I spent > than 50% of the 15 minute visit on counseling as documented in the note.  Obesity Desiree Dunn is currently in the action stage of change. As such, her goal is to continue with weight loss efforts She has agreed to follow the Category 2 plan Desiree Dunn will start using  the stationary bike for weight loss and overall health benefits. We discussed the following Behavioral Modification Strategies today: increase H2O intake, keeping healthy foods in the home, better snacking choices, increasing lean protein intake (alternate sources-black beans, cottage cheese), decreasing simple carbohydrates, increasing vegetables, work on meal planning and easy cooking plans and emotional eating strategies Additional lunch options were given to patient today.  Desiree Dunn has agreed to follow up with our clinic in 2 weeks. She was informed of the importance of frequent follow up visits to maximize her success with intensive lifestyle modifications for her multiple health conditions.  ALLERGIES: Allergies  Allergen Reactions  . Amoxicillin Hives    Hives    MEDICATIONS: Current Outpatient Medications on File Prior to Visit  Medication Sig Dispense Refill  . acetaminophen (TYLENOL) 500 MG tablet Take 1,000 mg by mouth as needed for mild pain or headache.    [name] buPROPion (WELLBUTRIN SR) 150 MG 12 hr tablet Take 1 tablet (150 mg total) by mouth daily. 30 tablet 0  . famotidine (PEPCID) 20 MG tablet Take 20 mg by mouth daily.     . fexofenadine (ALLEGRA) 180 MG tablet TAKE 1 TABLET BY MOUTH EVERY DAY 30 tablet 5  . HYDROcodone-homatropine (HYDROMET) 5-1.5 MG/5ML syrup Take 5 mLs by mouth every 6 (six) hours as needed for cough. 120 mL 0  . ibuprofen (ADVIL,MOTRIN) 200 MG tablet Take 400 mg by mouth as needed for moderate pain.    [name] lisinopril (PRINIVIL,ZESTRIL) 10 MG tablet TAKE 1 TABLET BY MOUTH EVERY DAY 90 tablet 0  . traMADol (ULTRAM) 50 MG tablet Take one tablet every 8 hours prn foot pain. 30 tablet 0  . verapamil (CALAN-SR) 120 MG CR tablet TAKE 1 TABLET BY MOUTH EVERY DAY 90 tablet 1  . Vitamin D, Ergocalciferol, (DRISDOL) 1.25 MG (50000 UT) CAPS capsule Take 1 capsule (50,000 Units total) by mouth every 7 (seven) days. 4 capsule 0   No current facility-administered  medications on file prior to visit.     PAST MEDICAL HISTORY: Past Medical History:  Diagnosis Date  . Allergy   . Back pain   . Chronic RLQ pain    since 1995 when she had ectopic preg  . Constipation   . Deviated septum   . Diverticulitis   . Ectopic pregnancy 1995    x 1 , R side   . GERD (gastroesophageal reflux disease)   . HTN (hypertension) 05/24/2015  . Migraine without aura and without status migrainosus, not intractable   . Miscarriage    x 1  . Obesity, unspecified 07/06/2010  . Pelvic congestion    h/o   . Sleep apnea    uses cpap    PAST SURGICAL HISTORY: Past Surgical History:  Procedure Laterality Date  . ABDOMINAL HYSTERECTOMY  12/2009   [facility]  . BREAST REDUCTION SURGERY  12/09  . COLONOSCOPY  2001   X3 ;  diverticulosis  . FOOT SURGERY    . NASAL SEPTOPLASTY W/ TURBINOPLASTY Bilateral 10/31/2018   Procedure: NASAL SEPTOPLASTY WITH TURBINATE REDUCTION;  Surgeon: Jerrell Belfast, MD;  Location: [facility];  Service: ENT;  Laterality: Bilateral;  . PARTIAL HYSTERECTOMY    . REDUCTION MAMMAPLASTY Bilateral   . RIGHT OOPHORECTOMY  12/2009   [place]  . TUBAL LIGATION  2000    SOCIAL HISTORY: Social History   Tobacco Use  . Smoking status: Never Smoker  . Smokeless tobacco: Never Used  Substance Use Topics  . Alcohol use: Yes    Alcohol/week: 0.0 standard drinks    Comment: socially - 1 drink every 3 months  . Drug use: No    FAMILY HISTORY: Family History  Problem Relation Age of Onset  . Colon cancer Mother 21  . Colon cancer Maternal Aunt 36  . Stroke Father   . Diabetes Father   . Hypertension Father   . Hyperlipidemia Father   . Heart disease Father   . Obesity Father   . Hypertension Sister   . Breast cancer Other        GM  . Colon polyps Brother   . Colitis Brother   . Heart attack Neg Hx     ROS: Review of Systems  Constitutional: Positive for weight loss.  Respiratory: Negative for shortness of breath (on exertion).     Cardiovascular: Negative for chest pain.  Gastrointestinal: Negative for nausea and vomiting.  Musculoskeletal:       Negative for muscle weakness  Psychiatric/Behavioral: Positive for depression. Negative for suicidal ideas.    PHYSICAL EXAM: Blood pressure 133/79, pulse 91, temperature 98.4 F (36.9 C), temperature source Oral, height 5\' 5"  (1.651 m), weight 220 lb (99.8 kg), SpO2 97 %. Body mass index is 36.61 kg/m. Physical Exam Vitals signs reviewed.  Constitutional:      Appearance: Normal appearance. She is well-developed. She is obese.  Cardiovascular:     Rate and Rhythm: Normal rate.  Pulmonary:     Effort: Pulmonary effort is normal.  Musculoskeletal: Normal range of motion.  Skin:    General: Skin is warm and dry.  Neurological:     Mental Status: She is alert and oriented to person, place, and time.  Psychiatric:        Mood and Affect: Mood normal.        Behavior: Behavior normal.        Thought Content: Thought content does not include homicidal or suicidal ideation.     RECENT LABS AND TESTS: BMET    Component Value Date/Time   NA 140 10/23/2018 0831   K 4.0 10/23/2018 0831   CL 107 10/23/2018 0831   CO2 22 10/23/2018 0831   GLUCOSE 91 10/23/2018 0831   BUN 9 10/23/2018 0831   CREATININE 0.92 10/23/2018 0831   CALCIUM 9.2 10/23/2018 0831   GFRNONAA >60 10/23/2018 0831   GFRAA >60 10/23/2018 0831   Lab Results  Component Value Date   HGBA1C 5.5 08/27/2018   HGBA1C 5.6 01/14/2017   Lab Results  Component Value Date   INSULIN 18.0 08/27/2018   CBC    Component Value Date/Time   WBC 4.9 10/23/2018 0838   RBC 4.52 10/23/2018 0838   HGB 13.2 10/23/2018 0838   HCT 41.8 10/23/2018 0838   PLT 243 10/23/2018 0838   MCV 92.5 10/23/2018 0838   MCH 29.2 10/23/2018 0838   MCHC 31.6 10/23/2018 0838   RDW 13.4 10/23/2018 9629  LYMPHSABS 2.9 06/17/2018 2110   MONOABS 0.8 06/17/2018 2110   EOSABS 0.1 06/17/2018 2110   BASOSABS 0.0 06/17/2018  2110   Iron/TIBC/Ferritin/ %Sat No results found for: IRON, TIBC, FERRITIN, IRONPCTSAT Lipid Panel     Component Value Date/Time   CHOL 205 (H) 08/27/2018 1124   TRIG 105 08/27/2018 1124   HDL 68 08/27/2018 1124   CHOLHDL 3 01/14/2017 1529   VLDL 37.2 01/14/2017 1529   LDLCALC 116 (H) 08/27/2018 1124   Hepatic Function Panel     Component Value Date/Time   PROT 8.2 (H) 06/17/2018 2110   ALBUMIN 4.4 06/17/2018 2110   AST 33 06/17/2018 2110   ALT 32 06/17/2018 2110   ALKPHOS 68 06/17/2018 2110   BILITOT 0.7 06/17/2018 2110   BILIDIR 0.0 04/23/2013 1442      Component Value Date/Time   TSH 1.140 08/27/2018 1124   TSH 0.87 04/12/2016 1515   TSH 0.66 04/23/2013 1442   Results for ASSYRIA, MORREALE (MRN [id number]) as of 01/06/2019 20:55  Ref. Range 08/27/2018 11:24  Vitamin D, 25-Hydroxy Latest Ref Range: 30.0 - 100.0 ng/mL 22.5 (L)     OBESITY BEHAVIORAL INTERVENTION VISIT  Today's visit was # 8   Starting weight: 224 lbs Starting date: 08/27/2018 Today's weight : 220 lbs Today's date: 01/06/2019 Total lbs lost to date: 4    01/06/2019  Height 5\' 5"  (1.651 m)  Weight 220 lb (99.8 kg)  BMI (Calculated) 36.61  BLOOD PRESSURE - SYSTOLIC 599  BLOOD PRESSURE - DIASTOLIC 79   Body Fat % 43 %  Total Body Water (lbs) 86.8 lbs    ASK: We discussed the diagnosis of obesity with Desiree Dunn today and Desiree Dunn agreed to give [place] permission to discuss obesity behavioral modification therapy today.  ASSESS: Desiree Dunn has the diagnosis of obesity and her BMI today is 36.61 Desiree Dunn is in the action stage of change   ADVISE: Desiree Dunn was educated on the multiple health risks of obesity as well as the benefit of weight loss to improve her health. She was advised of the need for long term treatment and the importance of lifestyle modifications to improve her current health and to decrease her risk of future health problems.  AGREE: Multiple dietary modification options  and treatment options were discussed and  Desiree Dunn agreed to follow the recommendations documented in the above note.  ARRANGE: Desiree Dunn was educated on the importance of frequent visits to treat obesity as outlined per CMS and USPSTF guidelines and agreed to schedule her next follow up appointment today.  Corey Skains, am acting as Location manager for [employer]. Owens Shark, DO  I have reviewed the above documentation for accuracy and completeness, and I agree with the above. -Jearld Lesch, DO

## 2019-01-10 ENCOUNTER — Other Ambulatory Visit (INDEPENDENT_AMBULATORY_CARE_PROVIDER_SITE_OTHER): Payer: Self-pay | Admitting: Bariatrics

## 2019-01-10 DIAGNOSIS — E559 Vitamin D deficiency, unspecified: Secondary | ICD-10-CM

## 2019-01-13 ENCOUNTER — Encounter (INDEPENDENT_AMBULATORY_CARE_PROVIDER_SITE_OTHER): Payer: Self-pay

## 2019-01-24 ENCOUNTER — Ambulatory Visit: Payer: BLUE CROSS/BLUE SHIELD | Admitting: Internal Medicine

## 2019-01-24 ENCOUNTER — Other Ambulatory Visit (INDEPENDENT_AMBULATORY_CARE_PROVIDER_SITE_OTHER): Payer: Self-pay | Admitting: Bariatrics

## 2019-01-24 DIAGNOSIS — F3289 Other specified depressive episodes: Secondary | ICD-10-CM

## 2019-01-26 ENCOUNTER — Ambulatory Visit (INDEPENDENT_AMBULATORY_CARE_PROVIDER_SITE_OTHER): Payer: BLUE CROSS/BLUE SHIELD | Admitting: Bariatrics

## 2019-01-26 ENCOUNTER — Other Ambulatory Visit: Payer: Self-pay

## 2019-01-26 ENCOUNTER — Encounter (INDEPENDENT_AMBULATORY_CARE_PROVIDER_SITE_OTHER): Payer: Self-pay | Admitting: Bariatrics

## 2019-01-26 ENCOUNTER — Encounter (INDEPENDENT_AMBULATORY_CARE_PROVIDER_SITE_OTHER): Payer: Self-pay

## 2019-01-26 ENCOUNTER — Ambulatory Visit (INDEPENDENT_AMBULATORY_CARE_PROVIDER_SITE_OTHER): Payer: Self-pay | Admitting: Bariatrics

## 2019-01-26 VITALS — BP 147/76 | HR 76 | Temp 98.0°F | Ht 65.0 in | Wt 218.0 lb

## 2019-01-26 DIAGNOSIS — I10 Essential (primary) hypertension: Secondary | ICD-10-CM | POA: Diagnosis not present

## 2019-01-26 DIAGNOSIS — Z6836 Body mass index (BMI) 36.0-36.9, adult: Secondary | ICD-10-CM | POA: Diagnosis not present

## 2019-01-26 DIAGNOSIS — E559 Vitamin D deficiency, unspecified: Secondary | ICD-10-CM | POA: Diagnosis not present

## 2019-01-27 NOTE — Progress Notes (Signed)
Office: [phone]  /  Fax: [phone]   HPI:   Chief Complaint: OBESITY Harriett is here to discuss her progress with her obesity treatment plan. She is on the Category 2 plan and is following her eating plan approximately 50 % of the time. She states she is exercising 0 minutes 0 times per week. Avriel is doing well overall. She is not struggling. "Steady wins the race". Her weight is 218 lb (98.9 kg) today and has had a weight loss of 2 pounds over a period of 3 weeks since her last visit. She has lost 6 lbs since starting treatment with [place].  Vitamin D deficiency Lynniah has a diagnosis of vitamin D deficiency. She is currently taking vit D and denies nausea, vomiting or muscle weakness.  Hypertension Noor Witte is a 48 y.o. female with hypertension. She is currently taking Lisinopril and Calan-SR. Pricila Douglas-McMillan denies lightheadedness. She is working weight loss to help control her blood pressure with the goal of decreasing her risk of heart attack and stroke. [name] blood pressure is currently controlled.  ASSESSMENT AND PLAN:  Vitamin D deficiency  Essential hypertension  Class 2 severe obesity with serious comorbidity and body mass index (BMI) of 36.0 to 36.9 in adult, unspecified obesity type ([place])  PLAN:  Vitamin D Deficiency Deoni was informed that low vitamin D levels contributes to fatigue and are associated with obesity, breast, and colon cancer. She agrees to continue to take prescription Vit D @50 ,000 IU every week and will follow up for routine testing of vitamin D, at least 2-3 times per year. She was informed of the risk of over-replacement of vitamin D and agrees to not increase her dose unless she discusses this with [place] first.  Hypertension We discussed sodium restriction, working on healthy weight loss, and a regular exercise program as the means to achieve improved blood pressure control. Nazifa agreed with this plan and agreed to follow up as  directed. We will continue to monitor her blood pressure as well as her progress with the above lifestyle modifications. She will continue her medications as prescribed and will watch for signs of hypotension as she continues her lifestyle modifications.  I spent > than 50% of the 15 minute visit on counseling as documented in the note.  Obesity Pelagia is currently in the action stage of change. As such, her goal is to continue with weight loss efforts She has agreed to follow the Category 2 plan Akayla has been instructed to work up to a goal of 150 minutes of combined cardio and strengthening exercise per week for weight loss and overall health benefits. We discussed the following Behavioral Modification Strategies today: increase H2O intake, no skipping meals, keeping healthy foods in the home, increasing lean protein intake, decreasing simple carbohydrates, increasing vegetables, decrease eating out and work on meal planning and easy cooking plans  Darcie has agreed to follow up with our clinic in 2 weeks fasting. She was informed of the importance of frequent follow up visits to maximize her success with intensive lifestyle modifications for her multiple health conditions.  ALLERGIES: Allergies  Allergen Reactions  . Amoxicillin Hives    Hives    MEDICATIONS: Current Outpatient Medications on File Prior to Visit  Medication Sig Dispense Refill  . acetaminophen (TYLENOL) 500 MG tablet Take 1,000 mg by mouth as needed for mild pain or headache.    [name] buPROPion (WELLBUTRIN SR) 150 MG 12 hr tablet Take 1 tablet (150 mg total) by mouth  daily. 30 tablet 0  . famotidine (PEPCID) 20 MG tablet Take 20 mg by mouth daily.     . fexofenadine (ALLEGRA) 180 MG tablet TAKE 1 TABLET BY MOUTH EVERY DAY 30 tablet 5  . HYDROcodone-homatropine (HYDROMET) 5-1.5 MG/5ML syrup Take 5 mLs by mouth every 6 (six) hours as needed for cough. 120 mL 0  . ibuprofen (ADVIL,MOTRIN) 200 MG tablet Take 400 mg by mouth as  needed for moderate pain.    [name] lisinopril (PRINIVIL,ZESTRIL) 10 MG tablet TAKE 1 TABLET BY MOUTH EVERY DAY 90 tablet 0  . traMADol (ULTRAM) 50 MG tablet Take one tablet every 8 hours prn foot pain. 30 tablet 0  . verapamil (CALAN-SR) 120 MG CR tablet TAKE 1 TABLET BY MOUTH EVERY DAY 90 tablet 1  . Vitamin D, Ergocalciferol, (DRISDOL) 1.25 MG (50000 UT) CAPS capsule TAKE 1 CAPSULE (50,000 UNITS TOTAL) BY MOUTH EVERY 7 (SEVEN) DAYS. 2 capsule 0   No current facility-administered medications on file prior to visit.     PAST MEDICAL HISTORY: Past Medical History:  Diagnosis Date  . Allergy   . Back pain   . Chronic RLQ pain    since 1995 when she had ectopic preg  . Constipation   . Deviated septum   . Diverticulitis   . Ectopic pregnancy 1995    x 1 , R side   . GERD (gastroesophageal reflux disease)   . HTN (hypertension) 05/24/2015  . Migraine without aura and without status migrainosus, not intractable   . Miscarriage    x 1  . Obesity, unspecified 07/06/2010  . Pelvic congestion    h/o   . Sleep apnea    uses cpap    PAST SURGICAL HISTORY: Past Surgical History:  Procedure Laterality Date  . ABDOMINAL HYSTERECTOMY  12/2009   [facility]  . BREAST REDUCTION SURGERY  12/09  . COLONOSCOPY  2001   X3 ; diverticulosis  . FOOT SURGERY    . NASAL SEPTOPLASTY W/ TURBINOPLASTY Bilateral 10/31/2018   Procedure: NASAL SEPTOPLASTY WITH TURBINATE REDUCTION;  Surgeon: Jerrell Belfast, MD;  Location: [facility];  Service: ENT;  Laterality: Bilateral;  . PARTIAL HYSTERECTOMY    . REDUCTION MAMMAPLASTY Bilateral   . RIGHT OOPHORECTOMY  12/2009   [facility]  . TUBAL LIGATION  2000    SOCIAL HISTORY: Social History   Tobacco Use  . Smoking status: Never Smoker  . Smokeless tobacco: Never Used  Substance Use Topics  . Alcohol use: Yes    Alcohol/week: 0.0 standard drinks    Comment: socially - 1 drink every 3 months  . Drug use: No    FAMILY HISTORY: Family History  Problem  Relation Age of Onset  . Colon cancer Mother 71  . Colon cancer Maternal Aunt 74  . Stroke Father   . Diabetes Father   . Hypertension Father   . Hyperlipidemia Father   . Heart disease Father   . Obesity Father   . Hypertension Sister   . Breast cancer Other        GM  . Colon polyps Brother   . Colitis Brother   . Heart attack Neg Hx     ROS: Review of Systems  Constitutional: Positive for weight loss.  Gastrointestinal: Negative for nausea and vomiting.  Musculoskeletal:       Negative for muscle weakness  Neurological:       Negative for lightheadedness    PHYSICAL EXAM: Blood pressure (!) 147/76, pulse 76, temperature 98  F (36.7 C), temperature source Oral, height 5\' 5"  (1.651 m), weight 218 lb (98.9 kg), SpO2 96 %. Body mass index is 36.28 kg/m. Physical Exam Vitals signs reviewed.  Constitutional:      Appearance: Normal appearance. She is well-developed. She is obese.  Cardiovascular:     Rate and Rhythm: Normal rate.  Pulmonary:     Effort: Pulmonary effort is normal.  Musculoskeletal: Normal range of motion.  Skin:    General: Skin is warm and dry.  Neurological:     Mental Status: She is alert and oriented to person, place, and time.  Psychiatric:        Mood and Affect: Mood normal.        Behavior: Behavior normal.     RECENT LABS AND TESTS: BMET    Component Value Date/Time   NA 140 10/23/2018 0831   K 4.0 10/23/2018 0831   CL 107 10/23/2018 0831   CO2 22 10/23/2018 0831   GLUCOSE 91 10/23/2018 0831   BUN 9 10/23/2018 0831   CREATININE 0.92 10/23/2018 0831   CALCIUM 9.2 10/23/2018 0831   GFRNONAA >60 10/23/2018 0831   GFRAA >60 10/23/2018 0831   Lab Results  Component Value Date   HGBA1C 5.5 08/27/2018   HGBA1C 5.6 01/14/2017   Lab Results  Component Value Date   INSULIN 18.0 08/27/2018   CBC    Component Value Date/Time   WBC 4.9 10/23/2018 0838   RBC 4.52 10/23/2018 0838   HGB 13.2 10/23/2018 0838   HCT 41.8 10/23/2018  0838   PLT 243 10/23/2018 0838   MCV 92.5 10/23/2018 0838   MCH 29.2 10/23/2018 0838   MCHC 31.6 10/23/2018 0838   RDW 13.4 10/23/2018 0838   LYMPHSABS 2.9 06/17/2018 2110   MONOABS 0.8 06/17/2018 2110   EOSABS 0.1 06/17/2018 2110   BASOSABS 0.0 06/17/2018 2110   Iron/TIBC/Ferritin/ %Sat No results found for: IRON, TIBC, FERRITIN, IRONPCTSAT Lipid Panel     Component Value Date/Time   CHOL 205 (H) 08/27/2018 1124   TRIG 105 08/27/2018 1124   HDL 68 08/27/2018 1124   CHOLHDL 3 01/14/2017 1529   VLDL 37.2 01/14/2017 1529   LDLCALC 116 (H) 08/27/2018 1124   Hepatic Function Panel     Component Value Date/Time   PROT 8.2 (H) 06/17/2018 2110   ALBUMIN 4.4 06/17/2018 2110   AST 33 06/17/2018 2110   ALT 32 06/17/2018 2110   ALKPHOS 68 06/17/2018 2110   BILITOT 0.7 06/17/2018 2110   BILIDIR 0.0 04/23/2013 1442      Component Value Date/Time   TSH 1.140 08/27/2018 1124   TSH 0.87 04/12/2016 1515   TSH 0.66 04/23/2013 1442     Ref. Range 08/27/2018 11:24  Vitamin D, 25-Hydroxy Latest Ref Range: 30.0 - 100.0 ng/mL 22.5 (L)     OBESITY BEHAVIORAL INTERVENTION VISIT  Today's visit was # 9   Starting weight: 224 lbs Starting date: 08/27/2018 Today's weight : 218 lbs Today's date: 01/26/2019 Total lbs lost to date: 6    01/26/2019  Height 5\' 5"  (1.651 m)  Weight 218 lb (98.9 kg)  BMI (Calculated) 36.28  BLOOD PRESSURE - SYSTOLIC 353  BLOOD PRESSURE - DIASTOLIC 76   Body Fat % 61.4 %  Total Body Water (lbs) 83.8 lbs    ASK: We discussed the diagnosis of obesity with [name] today and Paizlie agreed to give [place] permission to discuss obesity behavioral modification therapy today.  ASSESS: Shaniah has the diagnosis of  obesity and her BMI today is 36.28 Fatima is in the action stage of change   ADVISE: Truth was educated on the multiple health risks of obesity as well as the benefit of weight loss to improve her health. She was advised of the need for long  term treatment and the importance of lifestyle modifications to improve her current health and to decrease her risk of future health problems.  AGREE: Multiple dietary modification options and treatment options were discussed and  Brailee agreed to follow the recommendations documented in the above note.  ARRANGE: Esli was educated on the importance of frequent visits to treat obesity as outlined per CMS and USPSTF guidelines and agreed to schedule her next follow up appointment today.  Corey Skains, am acting as Location manager for [employer]. Owens Shark, DO  I have reviewed the above documentation for accuracy and completeness, and I agree with the above. -Jearld Lesch, DO

## 2019-02-03 ENCOUNTER — Encounter (INDEPENDENT_AMBULATORY_CARE_PROVIDER_SITE_OTHER): Payer: Self-pay

## 2019-02-09 ENCOUNTER — Ambulatory Visit (INDEPENDENT_AMBULATORY_CARE_PROVIDER_SITE_OTHER): Payer: BLUE CROSS/BLUE SHIELD | Admitting: Psychology

## 2019-02-09 DIAGNOSIS — F330 Major depressive disorder, recurrent, mild: Secondary | ICD-10-CM

## 2019-02-12 ENCOUNTER — Ambulatory Visit (INDEPENDENT_AMBULATORY_CARE_PROVIDER_SITE_OTHER): Payer: Self-pay | Admitting: Bariatrics

## 2019-02-14 ENCOUNTER — Other Ambulatory Visit: Payer: Self-pay | Admitting: Internal Medicine

## 2019-02-19 ENCOUNTER — Ambulatory Visit (INDEPENDENT_AMBULATORY_CARE_PROVIDER_SITE_OTHER): Payer: BLUE CROSS/BLUE SHIELD | Admitting: Psychology

## 2019-02-19 DIAGNOSIS — F330 Major depressive disorder, recurrent, mild: Secondary | ICD-10-CM | POA: Diagnosis not present

## 2019-02-24 ENCOUNTER — Other Ambulatory Visit: Payer: Self-pay | Admitting: Internal Medicine

## 2019-02-24 DIAGNOSIS — Z1231 Encounter for screening mammogram for malignant neoplasm of breast: Secondary | ICD-10-CM

## 2019-03-04 ENCOUNTER — Ambulatory Visit: Payer: BLUE CROSS/BLUE SHIELD | Admitting: Psychology

## 2019-03-09 ENCOUNTER — Ambulatory Visit: Payer: BLUE CROSS/BLUE SHIELD | Admitting: Adult Health

## 2019-03-12 ENCOUNTER — Other Ambulatory Visit: Payer: Self-pay | Admitting: Internal Medicine

## 2019-03-18 ENCOUNTER — Ambulatory Visit (INDEPENDENT_AMBULATORY_CARE_PROVIDER_SITE_OTHER): Payer: BLUE CROSS/BLUE SHIELD | Admitting: Psychology

## 2019-03-18 DIAGNOSIS — F330 Major depressive disorder, recurrent, mild: Secondary | ICD-10-CM | POA: Diagnosis not present

## 2019-03-25 ENCOUNTER — Other Ambulatory Visit: Payer: Self-pay | Admitting: Internal Medicine

## 2019-04-08 ENCOUNTER — Other Ambulatory Visit: Payer: Self-pay | Admitting: Internal Medicine

## 2019-04-22 ENCOUNTER — Ambulatory Visit: Payer: BC Managed Care – PPO | Admitting: Internal Medicine

## 2019-04-22 ENCOUNTER — Encounter: Payer: Self-pay | Admitting: Internal Medicine

## 2019-04-22 ENCOUNTER — Other Ambulatory Visit: Payer: Self-pay

## 2019-04-22 VITALS — BP 122/86 | HR 73 | Temp 98.3°F | Ht 64.0 in | Wt 230.4 lb

## 2019-04-22 DIAGNOSIS — Z0000 Encounter for general adult medical examination without abnormal findings: Secondary | ICD-10-CM

## 2019-04-22 DIAGNOSIS — I10 Essential (primary) hypertension: Secondary | ICD-10-CM | POA: Diagnosis not present

## 2019-04-22 DIAGNOSIS — R062 Wheezing: Secondary | ICD-10-CM

## 2019-04-22 LAB — POCT URINALYSIS DIPSTICK
Bilirubin, UA: NEGATIVE
Blood, UA: NEGATIVE
Glucose, UA: NEGATIVE
Ketones, UA: NEGATIVE
Leukocytes, UA: NEGATIVE
Nitrite, UA: NEGATIVE
Protein, UA: NEGATIVE
Spec Grav, UA: 1.015 (ref 1.010–1.025)
Urobilinogen, UA: 0.2 E.U./dL
pH, UA: 7 (ref 5.0–8.0)

## 2019-04-22 LAB — POCT UA - MICROALBUMIN
Albumin/Creatinine Ratio, Urine, POC: <30
Creatinine, POC: 200 mg/dL
Microalbumin Ur, POC: 10 mg/L

## 2019-04-22 MED ORDER — ALBUTEROL SULFATE HFA 108 (90 BASE) MCG/ACT IN AERS: 2.0000 | INHALATION_SPRAY | Freq: Four times a day (QID) | RESPIRATORY_TRACT | 2 refills | Status: DC | PRN

## 2019-04-22 NOTE — Patient Instructions (Addendum)
Dr. Merrily Pew Axe - intermittent fasting    Health Maintenance, Female Adopting a healthy lifestyle and getting preventive care can go a long way to promote health and wellness. Talk with your health care provider about what schedule of regular examinations is right for you. This is a good chance for you to check in with your provider about disease prevention and staying healthy. In between checkups, there are plenty of things you can do on your own. Experts have done a lot of research about which lifestyle changes and preventive measures are most likely to keep you healthy. Ask your health care provider for more information. Weight and diet Eat a healthy diet  Be sure to include plenty of vegetables, fruits, low-fat dairy products, and lean protein.  Do not eat a lot of foods high in solid fats, added sugars, or salt.  Get regular exercise. This is one of the most important things you can do for your health. ? Most adults should exercise for at least 150 minutes each week. The exercise should increase your heart rate and make you sweat (moderate-intensity exercise). ? Most adults should also do strengthening exercises at least twice a week. This is in addition to the moderate-intensity exercise. Maintain a healthy weight  Body mass index (BMI) is a measurement that can be used to identify possible weight problems. It estimates body fat based on height and weight. Your health care provider can help determine your BMI and help you achieve or maintain a healthy weight.  For females 79 years of age and older: ? A BMI below 18.5 is considered underweight. ? A BMI of 18.5 to 24.9 is normal. ? A BMI of 25 to 29.9 is considered overweight. ? A BMI of 30 and above is considered obese. Watch levels of cholesterol and blood lipids  You should start having your blood tested for lipids and cholesterol at 49 years of age, then have this test every 5 years.  You may need to have your cholesterol levels  checked more often if: ? Your lipid or cholesterol levels are high. ? You are older than 49 years of age. ? You are at high risk for heart disease. Cancer screening Lung Cancer  Lung cancer screening is recommended for adults 43-69 years old who are at high risk for lung cancer because of a history of smoking.  A yearly low-dose CT scan of the lungs is recommended for people who: ? Currently smoke. ? Have quit within the past 15 years. ? Have at least a 30-pack-year history of smoking. A pack year is smoking an average of one pack of cigarettes a day for 1 year.  Yearly screening should continue until it has been 15 years since you quit.  Yearly screening should stop if you develop a health problem that would prevent you from having lung cancer treatment. Breast Cancer  Practice breast self-awareness. This means understanding how your breasts normally appear and feel.  It also means doing regular breast self-exams. Let your health care provider know about any changes, no matter how small.  If you are in your 20s or 30s, you should have a clinical breast exam (CBE) by a health care provider every 1-3 years as part of a regular health exam.  If you are 50 or older, have a CBE every year. Also consider having a breast X-ray (mammogram) every year.  If you have a family history of breast cancer, talk to your health care provider about genetic screening.  If  you are at high risk for breast cancer, talk to your health care provider about having an MRI and a mammogram every year.  Breast cancer gene (BRCA) assessment is recommended for women who have family members with BRCA-related cancers. BRCA-related cancers include: ? Breast. ? Ovarian. ? Tubal. ? Peritoneal cancers.  Results of the assessment will determine the need for genetic counseling and BRCA1 and BRCA2 testing. Cervical Cancer Your health care provider may recommend that you be screened regularly for cancer of the pelvic  organs (ovaries, uterus, and vagina). This screening involves a pelvic examination, including checking for microscopic changes to the surface of your cervix (Pap test). You may be encouraged to have this screening done every 3 years, beginning at age 46.  For women ages 104-65, health care providers may recommend pelvic exams and Pap testing every 3 years, or they may recommend the Pap and pelvic exam, combined with testing for human papilloma virus (HPV), every 5 years. Some types of HPV increase your risk of cervical cancer. Testing for HPV may also be done on women of any age with unclear Pap test results.  Other health care providers may not recommend any screening for nonpregnant women who are considered low risk for pelvic cancer and who do not have symptoms. Ask your health care provider if a screening pelvic exam is right for you.  If you have had past treatment for cervical cancer or a condition that could lead to cancer, you need Pap tests and screening for cancer for at least 20 years after your treatment. If Pap tests have been discontinued, your risk factors (such as having a new sexual partner) need to be reassessed to determine if screening should resume. Some women have medical problems that increase the chance of getting cervical cancer. In these cases, your health care provider may recommend more frequent screening and Pap tests. Colorectal Cancer  This type of cancer can be detected and often prevented.  Routine colorectal cancer screening usually begins at 49 years of age and continues through 49 years of age.  Your health care provider may recommend screening at an earlier age if you have risk factors for colon cancer.  Your health care provider may also recommend using home test kits to check for hidden blood in the stool.  A small camera at the end of a tube can be used to examine your colon directly (sigmoidoscopy or colonoscopy). This is done to check for the earliest forms  of colorectal cancer.  Routine screening usually begins at age 67.  Direct examination of the colon should be repeated every 5-10 years through 49 years of age. However, you may need to be screened more often if early forms of precancerous polyps or small growths are found. Skin Cancer  Check your skin from head to toe regularly.  Tell your health care provider about any new moles or changes in moles, especially if there is a change in a mole's shape or color.  Also tell your health care provider if you have a mole that is larger than the size of a pencil eraser.  Always use sunscreen. Apply sunscreen liberally and repeatedly throughout the day.  Protect yourself by wearing long sleeves, pants, a wide-brimmed hat, and sunglasses whenever you are outside. Heart disease, diabetes, and high blood pressure  High blood pressure causes heart disease and increases the risk of stroke. High blood pressure is more likely to develop in: ? People who have blood pressure in the high end  of the normal range (130-139/85-89 mm Hg). ? People who are overweight or obese. ? People who are African American.  If you are 85-16 years of age, have your blood pressure checked every 3-5 years. If you are 22 years of age or older, have your blood pressure checked every year. You should have your blood pressure measured twice-once when you are at a hospital or clinic, and once when you are not at a hospital or clinic. Record the average of the two measurements. To check your blood pressure when you are not at a hospital or clinic, you can use: ? An automated blood pressure machine at a pharmacy. ? A home blood pressure monitor.  If you are between 42 years and 16 years old, ask your health care provider if you should take aspirin to prevent strokes.  Have regular diabetes screenings. This involves taking a blood sample to check your fasting blood sugar level. ? If you are at a normal weight and have a low risk for  diabetes, have this test once every three years after 49 years of age. ? If you are overweight and have a high risk for diabetes, consider being tested at a younger age or more often. Preventing infection Hepatitis B  If you have a higher risk for hepatitis B, you should be screened for this virus. You are considered at high risk for hepatitis B if: ? You were born in a country where hepatitis B is common. Ask your health care provider which countries are considered high risk. ? Your parents were born in a high-risk country, and you have not been immunized against hepatitis B (hepatitis B vaccine). ? You have HIV or AIDS. ? You use needles to inject street drugs. ? You live with someone who has hepatitis B. ? You have had sex with someone who has hepatitis B. ? You get hemodialysis treatment. ? You take certain medicines for conditions, including cancer, organ transplantation, and autoimmune conditions. Hepatitis C  Blood testing is recommended for: ? Everyone born from [birth year] through 1966. ? Anyone with known risk factors for hepatitis C. Sexually transmitted infections (STIs)  You should be screened for sexually transmitted infections (STIs) including gonorrhea and chlamydia if: ? You are sexually active and are younger than 49 years of age. ? You are older than 49 years of age and your health care provider tells you that you are at risk for this type of infection. ? Your sexual activity has changed since you were last screened and you are at an increased risk for chlamydia or gonorrhea. Ask your health care provider if you are at risk.  If you do not have HIV, but are at risk, it may be recommended that you take a prescription medicine daily to prevent HIV infection. This is called pre-exposure prophylaxis (PrEP). You are considered at risk if: ? You are sexually active and do not regularly use condoms or know the HIV status of your partner(s). ? You take drugs by injection. ? You are  sexually active with a partner who has HIV. Talk with your health care provider about whether you are at high risk of being infected with HIV. If you choose to begin PrEP, you should first be tested for HIV. You should then be tested every 3 months for as long as you are taking PrEP. Pregnancy  If you are premenopausal and you may become pregnant, ask your health care provider about preconception counseling.  If you may become pregnant, take  400 to 800 micrograms (mcg) of folic acid every day.  If you want to prevent pregnancy, talk to your health care provider about birth control (contraception). Osteoporosis and menopause  Osteoporosis is a disease in which the bones lose minerals and strength with aging. This can result in serious bone fractures. Your risk for osteoporosis can be identified using a bone density scan.  If you are 29 years of age or older, or if you are at risk for osteoporosis and fractures, ask your health care provider if you should be screened.  Ask your health care provider whether you should take a calcium or vitamin D supplement to lower your risk for osteoporosis.  Menopause may have certain physical symptoms and risks.  Hormone replacement therapy may reduce some of these symptoms and risks. Talk to your health care provider about whether hormone replacement therapy is right for you. Follow these instructions at home:  Schedule regular health, dental, and eye exams.  Stay current with your immunizations.  Do not use any tobacco products including cigarettes, chewing tobacco, or electronic cigarettes.  If you are pregnant, do not drink alcohol.  If you are breastfeeding, limit how much and how often you drink alcohol.  Limit alcohol intake to no more than 1 drink per day for nonpregnant women. One drink equals 12 ounces of beer, 5 ounces of wine, or 1 ounces of hard liquor.  Do not use street drugs.  Do not share needles.  Ask your health care  provider for help if you need support or information about quitting drugs.  Tell your health care provider if you often feel depressed.  Tell your health care provider if you have ever been abused or do not feel safe at home. This information is not intended to replace advice given to you by your health care provider. Make sure you discuss any questions you have with your health care provider. Document Released: 05/14/2011 Document Revised: 04/05/2016 Document Reviewed: 08/02/2015 Elsevier Interactive Patient Education  2019 [employer].

## 2019-04-23 ENCOUNTER — Other Ambulatory Visit: Payer: Self-pay | Admitting: Neurology

## 2019-04-23 LAB — CMP14+EGFR
ALT: 28 IU/L (ref 0–32)
AST: 26 IU/L (ref 0–40)
Albumin/Globulin Ratio: 1.7 (ref 1.2–2.2)
Albumin: 4.6 g/dL (ref 3.8–4.8)
Alkaline Phosphatase: 71 IU/L (ref 39–117)
BUN/Creatinine Ratio: 10 (ref 9–23)
BUN: 8 mg/dL (ref 6–24)
Bilirubin Total: 0.4 mg/dL (ref 0.0–1.2)
CO2: 23 mmol/L (ref 20–29)
Calcium: 9.4 mg/dL (ref 8.7–10.2)
Chloride: 104 mmol/L (ref 96–106)
Creatinine, Ser: 0.8 mg/dL (ref 0.57–1.00)
GFR calc Af Amer: 100 mL/min/{1.73_m2} (ref >59)
GFR calc non Af Amer: 87 mL/min/{1.73_m2} (ref >59)
Globulin, Total: 2.7 g/dL (ref 1.5–4.5)
Glucose: 80 mg/dL (ref 65–99)
Potassium: 4.2 mmol/L (ref 3.5–5.2)
Sodium: 138 mmol/L (ref 134–144)
Total Protein: 7.3 g/dL (ref 6.0–8.5)

## 2019-04-23 LAB — CBC
Hematocrit: 40.4 % (ref 34.0–46.6)
Hemoglobin: 13.5 g/dL (ref 11.1–15.9)
MCH: 30.1 pg (ref 26.6–33.0)
MCHC: 33.4 g/dL (ref 31.5–35.7)
MCV: 90 fL (ref 79–97)
Platelets: 216 10*3/uL (ref 150–450)
RBC: 4.48 x10E6/uL (ref 3.77–5.28)
RDW: 13.3 % (ref 11.7–15.4)
WBC: 5.2 10*3/uL (ref 3.4–10.8)

## 2019-04-23 LAB — LIPID PANEL
Chol/HDL Ratio: 3 ratio (ref 0.0–4.4)
Cholesterol, Total: 202 mg/dL — ABNORMAL HIGH (ref 100–199)
HDL: 68 mg/dL (ref >39)
LDL Calculated: 109 mg/dL — ABNORMAL HIGH (ref 0–99)
Triglycerides: 123 mg/dL (ref 0–149)
VLDL Cholesterol Cal: 25 mg/dL (ref 5–40)

## 2019-04-23 LAB — HEMOGLOBIN A1C
Est. average glucose Bld gHb Est-mCnc: 111 mg/dL
Hgb A1c MFr Bld: 5.5 % (ref 4.8–5.6)

## 2019-04-24 ENCOUNTER — Ambulatory Visit
Admission: RE | Admit: 2019-04-24 | Discharge: 2019-04-24 | Disposition: A | Discharge status: Home / Self Care | Payer: Self-pay | Source: Ambulatory Visit | Attending: Internal Medicine | Admitting: Internal Medicine

## 2019-04-24 ENCOUNTER — Other Ambulatory Visit: Payer: Self-pay

## 2019-04-24 DIAGNOSIS — Z1231 Encounter for screening mammogram for malignant neoplasm of breast: Secondary | ICD-10-CM

## 2019-04-26 NOTE — Progress Notes (Signed)
Subjective:     Patient ID: Desiree Dunn , female    DOB: 1971/05/19 , 48 y.o.   MRN: [id number]   Chief Complaint  Patient presents with  . Annual Exam  . Hypertension    HPI  She is here today for a full physical examination.  She is followed by Gyn for her pelvic exams. She denies having any specific concerns or complaints at this time.   Hypertension This is a chronic problem. The current episode started more than 1 year ago. The problem has been gradually improving since onset. The problem is controlled. Pertinent negatives include no blurred vision, chest pain, palpitations or shortness of breath. Risk factors for coronary artery disease include obesity. Past treatments include ACE inhibitors. The current treatment provides moderate improvement. Compliance problems include exercise.      Past Medical History:  Diagnosis Date  . Allergy   . Back pain   . Chronic RLQ pain    since 1995 when she had ectopic preg  . Constipation   . Deviated septum   . Diverticulitis   . Ectopic pregnancy 1995    x 1 , R side   . GERD (gastroesophageal reflux disease)   . HTN (hypertension) 05/24/2015  . Migraine without aura and without status migrainosus, not intractable   . Miscarriage    x 1  . Obesity, unspecified 07/06/2010  . Pelvic congestion    h/o   . Sleep apnea    uses cpap     Family History  Problem Relation Age of Onset  . Colon cancer Mother 13  . Colon cancer Maternal Aunt 52  . Stroke Father   . Diabetes Father   . Hypertension Father   . Hyperlipidemia Father   . Heart disease Father   . Obesity Father   . Hypertension Sister   . Breast cancer Other        GM  . Colon polyps Brother   . Colitis Brother   . Heart attack Neg Hx      Current Outpatient Medications:  .  acetaminophen (TYLENOL) 500 MG tablet, Take 1,000 mg by mouth as needed for mild pain or headache., Disp: , Rfl:  .  famotidine (PEPCID) 20 MG tablet, TAKE 1 TABLET BY MOUTH TWICE  A DAY, Disp: 180 tablet, Rfl: 1 .  fexofenadine (ALLEGRA) 180 MG tablet, TAKE 1 TABLET BY MOUTH EVERY DAY, Disp: 30 tablet, Rfl: 5 .  ibuprofen (ADVIL,MOTRIN) 200 MG tablet, Take 400 mg by mouth as needed for moderate pain., Disp: , Rfl:  .  lisinopril (PRINIVIL,ZESTRIL) 10 MG tablet, TAKE 1 TABLET BY MOUTH EVERY DAY, Disp: 90 tablet, Rfl: 0 .  traMADol (ULTRAM) 50 MG tablet, Take one tablet every 8 hours prn foot pain., Disp: 30 tablet, Rfl: 0 .  verapamil (CALAN-SR) 120 MG CR tablet, TAKE 1 TABLET BY MOUTH EVERY DAY, Disp: 90 tablet, Rfl: 0 .  Vitamin D, Ergocalciferol, (DRISDOL) 1.25 MG (50000 UT) CAPS capsule, TAKE 1 CAPSULE (50,000 UNITS TOTAL) BY MOUTH EVERY 7 (SEVEN) DAYS., Disp: 2 capsule, Rfl: 0 .  albuterol (VENTOLIN HFA) 108 (90 Base) MCG/ACT inhaler, Inhale 2 puffs into the lungs every 6 (six) hours as needed for wheezing or shortness of breath., Disp: 1 Inhaler, Rfl: 2   Allergies  Allergen Reactions  . Amoxicillin Hives    Hives     The patient states she uses none for birth control. Last LMP was No LMP recorded. Patient has had a hysterectomy.. Negative  for Dysmenorrhea Negative for: breast discharge, breast lump(s), breast pain and breast self exam. Associated symptoms include abnormal vaginal bleeding. Pertinent negatives include abnormal bleeding (hematology), anxiety, decreased libido, depression, difficulty falling sleep, dyspareunia, history of infertility, nocturia, sexual dysfunction, sleep disturbances, urinary incontinence, urinary urgency, vaginal discharge and vaginal itching. Diet regular.The patient states her exercise level is  intermittent.  . The patient's tobacco use is:  Social History   Tobacco Use  Smoking Status Never Smoker  Smokeless Tobacco Never Used  . She has been exposed to passive smoke. The patient's alcohol use is:  Social History   Substance and Sexual Activity  Alcohol Use Yes  . Alcohol/week: 0.0 standard drinks   Comment: socially - 1  drink every 3 months    Review of Systems  Constitutional: Negative.   HENT: Negative.   Eyes: Negative.  Negative for blurred vision.  Respiratory: Positive for wheezing. Negative for shortness of breath.        She reports h/o wheezing. No h/o asthma. No fever/chills/sob. Has wheezing with exercise.   Cardiovascular: Negative.  Negative for chest pain and palpitations.  Gastrointestinal: Negative.   Endocrine: Negative.   Genitourinary: Negative.   Musculoskeletal: Negative.   Skin: Negative.   Allergic/Immunologic: Negative.   Neurological: Negative.   Psychiatric/Behavioral: Negative.      Today's Vitals   04/22/19 1045  BP: 122/86  Pulse: 73  Temp: 98.3 F (36.8 C)  TempSrc: Oral  Weight: 230 lb 6.4 oz (104.5 kg)  Height: _0  (1.626 m)   Body mass index is 39.55 kg/m.   Objective:  Physical Exam Vitals signs and nursing note reviewed.  Constitutional:      Appearance: Normal appearance.  HENT:     Head: Normocephalic and atraumatic.     Right Ear: Tympanic membrane, ear canal and external ear normal.     Left Ear: Tympanic membrane, ear canal and external ear normal.     Nose: Nose normal.     Mouth/Throat:     Mouth: Mucous membranes are moist.     Pharynx: Oropharynx is clear.  Eyes:     Extraocular Movements: Extraocular movements intact.     Conjunctiva/sclera: Conjunctivae normal.     Pupils: Pupils are equal, round, and reactive to light.  Neck:     Musculoskeletal: Normal range of motion and neck supple.  Cardiovascular:     Rate and Rhythm: Normal rate and regular rhythm.     Pulses: Normal pulses.     Heart sounds: Normal heart sounds.  Pulmonary:     Effort: Pulmonary effort is normal.     Breath sounds: Normal breath sounds.  Chest:     Breasts: Tanner Score is 5.        Right: Normal. No swelling, bleeding, inverted nipple, mass, nipple discharge or skin change.        Left: Normal. No swelling, bleeding, inverted nipple, mass, nipple  discharge or skin change.  Abdominal:     General: Abdomen is flat. Bowel sounds are normal.     Palpations: Abdomen is soft.  Genitourinary:    Comments: deferred Musculoskeletal: Normal range of motion.  Skin:    General: Skin is warm and dry.  Neurological:     General: No focal deficit present.     Mental Status: She is alert and oriented to person, place, and time.  Psychiatric:        Mood and Affect: Mood normal.  Behavior: Behavior normal.         Assessment And Plan:     1. Routine general medical examination at health care facility  A full exam was performed.  Importance of monthly self breast exams was discussed with the patient.  PATIENT HAS BEEN ADVISED TO GET 30-45 MINUTES REGULAR EXERCISE NO LESS THAN FOUR TO FIVE DAYS PER WEEK - BOTH WEIGHTBEARING EXERCISES AND AEROBIC ARE RECOMMENDED.  SHE IS ADVISED TO FOLLOW A HEALTHY DIET WITH AT LEAST SIX FRUITS/VEGGIES PER DAY, DECREASE INTAKE OF RED MEAT, AND TO INCREASE FISH INTAKE TO TWO DAYS PER WEEK.  MEATS/FISH SHOULD NOT BE FRIED, BAKED OR BROILED IS PREFERABLE.  I SUGGEST WEARING SPF 50 SUNSCREEN ON EXPOSED PARTS AND ESPECIALLY WHEN IN THE DIRECT SUNLIGHT FOR AN EXTENDED PERIOD OF TIME.  PLEASE AVOID FAST FOOD RESTAURANTS AND INCREASE YOUR WATER INTAKE.  - CMP14+EGFR - CBC - Lipid panel - Hemoglobin A1c  2. Essential hypertension  Well controlled. She will continue with current meds. She is encouraged to avoid adding salt to her foods. EKG performed, no new changes noted. She is encouraged to incorporate more exercise into her daily routine. She will rto in six months for re-evaluation.   - EKG 12-Lead - POCT Urinalysis Dipstick (81002) - POCT UA - Microalbumin  3. Wheezing without diagnosis of asthma  Pt advised she may have exercise induced asthma. She was given rx albuterol MDI to use within 30 minutes prior to exercise and prn. She will let me know if her sx persist. Pt advised that she may need  pulmonary eval for spirometry testing.   [name], MD    THE PATIENT IS ENCOURAGED TO PRACTICE SOCIAL DISTANCING DUE TO THE COVID-19 PANDEMIC.

## 2019-05-20 ENCOUNTER — Other Ambulatory Visit: Payer: Self-pay

## 2019-05-20 ENCOUNTER — Encounter: Payer: Self-pay | Admitting: Nurse Practitioner

## 2019-05-20 ENCOUNTER — Ambulatory Visit (INDEPENDENT_AMBULATORY_CARE_PROVIDER_SITE_OTHER): Payer: BC Managed Care – PPO | Admitting: Nurse Practitioner

## 2019-05-20 VITALS — Ht 65.0 in | Wt 225.0 lb

## 2019-05-20 DIAGNOSIS — J029 Acute pharyngitis, unspecified: Secondary | ICD-10-CM | POA: Insufficient documentation

## 2019-05-20 DIAGNOSIS — R0982 Postnasal drip: Secondary | ICD-10-CM | POA: Diagnosis not present

## 2019-05-20 DIAGNOSIS — J3089 Other allergic rhinitis: Secondary | ICD-10-CM

## 2019-05-20 MED ORDER — NOREL AD 4-10-325 MG PO TABS: 1.0000 | ORAL_TABLET | Freq: Two times a day (BID) | ORAL | 1 refills | Status: DC | PRN

## 2019-05-20 NOTE — Progress Notes (Signed)
Virtual Visit via video   This visit type was conducted due to national recommendations for restrictions regarding the COVID-19 Pandemic (e.g. social distancing) in an effort to limit this patient's exposure and mitigate transmission in our community.  Due to her co-morbid illnesses, this patient is at least at moderate risk for complications without adequate follow up.  This format is felt to be most appropriate for this patient at this time.  All issues noted in this document were discussed and addressed.  A limited physical exam was performed with this format.    This visit type was conducted due to national recommendations for restrictions regarding the COVID-19 Pandemic (e.g. social distancing) in an effort to limit this patient's exposure and mitigate transmission in our community.  Patients identity confirmed using two different identifiers.  This format is felt to be most appropriate for this patient at this time.  All issues noted in this document were discussed and addressed.  No physical exam was performed (except for noted visual exam findings with Video Visits).    Date:  06/05/2019   ID:  Desiree Dunn, DOB 1970-11-22, MRN [id number]  Patient Location:  Home - spoke with Desiree Dunn  Provider location:   Office    Chief Complaint:  sorethroat  History of Present Illness:    Desiree Dunn is a 48 y.o. female who presents via video conferencing for a telehealth visit today.    The patient does not have symptoms concerning for COVID-19 infection (fever, chills, cough, or new shortness of breath).   Her husband works at [employer] and daughter works at [employer].  Negative for being fatigue.    URI  This is a new problem. The current episode started in the past 7 days. The problem has been gradually improving. There has been no fever. Associated symptoms include coughing (at night mostly) and a sore throat. Pertinent negatives include no abdominal pain or  nausea. She has tried decongestant (she also has drainage to the back of her throat.  Mucinex for the cough) for the symptoms. The treatment provided no relief.     Past Medical History:  Diagnosis Date  . Allergy   . Back pain   . Chronic RLQ pain    since 1995 when she had ectopic preg  . Constipation   . Deviated septum   . Diverticulitis   . Ectopic pregnancy 1995    x 1 , R side   . GERD (gastroesophageal reflux disease)   . HTN (hypertension) 05/24/2015  . Migraine without aura and without status migrainosus, not intractable   . Miscarriage    x 1  . Obesity, unspecified 07/06/2010  . Pelvic congestion    h/o   . Sleep apnea    uses cpap   Past Surgical History:  Procedure Laterality Date  . ABDOMINAL HYSTERECTOMY  12/2009   [facility]  . BREAST REDUCTION SURGERY  12/09  . COLONOSCOPY  2001   X3 ; diverticulosis  . FOOT SURGERY    . NASAL SEPTOPLASTY W/ TURBINOPLASTY Bilateral 10/31/2018   Procedure: NASAL SEPTOPLASTY WITH TURBINATE REDUCTION;  Surgeon: Jerrell Belfast, MD;  Location: [facility];  Service: ENT;  Laterality: Bilateral;  . PARTIAL HYSTERECTOMY    . REDUCTION MAMMAPLASTY Bilateral   . RIGHT OOPHORECTOMY  12/2009   [facility]  . TUBAL LIGATION  2000     Current Meds  Medication Sig  . acetaminophen (TYLENOL) 500 MG tablet Take 1,000 mg by mouth as needed for mild  pain or headache.  . albuterol (VENTOLIN HFA) 108 (90 Base) MCG/ACT inhaler Inhale 2 puffs into the lungs every 6 (six) hours as needed for wheezing or shortness of breath.  . fexofenadine (ALLEGRA) 180 MG tablet TAKE 1 TABLET BY MOUTH EVERY DAY  . ibuprofen (ADVIL,MOTRIN) 200 MG tablet Take 400 mg by mouth as needed for moderate pain.  [name] lisinopril (PRINIVIL,ZESTRIL) 10 MG tablet TAKE 1 TABLET BY MOUTH EVERY DAY  . verapamil (CALAN-SR) 120 MG CR tablet TAKE 1 TABLET BY MOUTH EVERY DAY     Allergies:   Amoxicillin   Social History   Tobacco Use  . Smoking status: Never Smoker  . Smokeless  tobacco: Never Used  Substance Use Topics  . Alcohol use: Yes    Alcohol/week: 0.0 standard drinks    Comment: socially - 1 drink every 3 months  . Drug use: No     Family Hx: The patient's family history includes Breast cancer in an other family member; Colitis in her brother; Colon cancer (age of onset: 65) in her maternal aunt; Colon cancer (age of onset: 39) in her mother; Colon polyps in her brother; Diabetes in her father; Heart disease in her father; Hyperlipidemia in her father; Hypertension in her father and sister; Obesity in her father; Stroke in her father. There is no history of Heart attack.  ROS:   Please see the history of present illness.    Review of Systems  HENT: Positive for sore throat.   Respiratory: Positive for cough (at night mostly).   Gastrointestinal: Negative for abdominal pain and nausea.    All other systems reviewed and are negative.   Labs/Other Tests and Data Reviewed:    Recent Labs: 08/27/2018: TSH 1.140 04/22/2019: ALT 28; BUN 8; Creatinine, Ser 0.80; Hemoglobin 13.5; Platelets 216; Potassium 4.2; Sodium 138   Recent Lipid Panel Lab Results  Component Value Date/Time   CHOL 202 (H) 04/22/2019 12:13 PM   TRIG 123 04/22/2019 12:13 PM   HDL 68 04/22/2019 12:13 PM   CHOLHDL 3.0 04/22/2019 12:13 PM   CHOLHDL 3 01/14/2017 03:29 PM   LDLCALC 109 (H) 04/22/2019 12:13 PM    Wt Readings from Last 3 Encounters:  06/04/19 228 lb (103.4 kg)  05/20/19 225 lb (102.1 kg)  04/22/19 230 lb 6.4 oz (104.5 kg)     Exam:    Vital Signs:  Ht 5\' 5"  (1.651 m)   Wt 225 lb (102.1 kg)   BMI 37.44 kg/m     Physical Exam  Constitutional: She is oriented to person, place, and time and well-developed, well-nourished, and in no distress.  Pulmonary/Chest: Effort normal.  Neurological: She is alert and oriented to person, place, and time.  Psychiatric: Mood, memory, affect and judgment normal.    ASSESSMENT & PLAN:    1. Non-seasonal allergic rhinitis,  unspecified trigger  Will treat with norel ad as this has been effective for her   I do not feel this is COVID related  2. Postnasal drip  Use norel ad at bedtime to help with the symptoms - NOREL AD 4-10-325 MG TABS; Take 1 tablet by mouth 2 (two) times daily as needed.  Dispense: 20 tablet; Refill: 1  3. Sorethroat  Likely related to postnasal drainage  Do salt water gargles up to 4 times a day as needed.   COVID-19 Education: The signs and symptoms of COVID-19 were discussed with the patient and how to seek care for testing (follow up with PCP or arrange  E-visit).  The importance of social distancing was discussed today.  Patient Risk:   After full review of this patients clinical status, I feel that they are at least moderate risk at this time.  Time:   Today, I have spent 11 minutes/ seconds with the patient with telehealth technology discussing above diagnoses.     Medication Adjustments/Labs and Tests Ordered: Current medicines are reviewed at length with the patient today.  Concerns regarding medicines are outlined above.   Tests Ordered: No orders of the defined types were placed in this encounter.   Medication Changes: Meds ordered this encounter  Medications  . NOREL AD 4-10-325 MG TABS    Sig: Take 1 tablet by mouth 2 (two) times daily as needed.    Dispense:  20 tablet    Refill:  1    Disposition:  Follow up prn  Signed, Minette Brine, FNP

## 2019-05-21 ENCOUNTER — Telehealth: Payer: Self-pay | Admitting: *Deleted

## 2019-05-21 ENCOUNTER — Other Ambulatory Visit: Payer: Self-pay

## 2019-05-21 DIAGNOSIS — Z20822 Contact with and (suspected) exposure to covid-19: Secondary | ICD-10-CM

## 2019-05-21 NOTE — Telephone Encounter (Signed)
Pt scheduled for today at [facility] at 2:15 for covid-19 testing. She is advised that this is a drive thru test site and to wear a mask, stay in car with windows rolled up until ready for testing. She voiced understanding.

## 2019-05-21 NOTE — Telephone Encounter (Signed)
-----   Message from Octavio Manns sent at 05/20/2019  3:04 PM EDT ----- Please test pt due to symptoms

## 2019-05-25 LAB — NOVEL CORONAVIRUS, NAA: SARS-CoV-2, NAA: NOT DETECTED

## 2019-06-02 ENCOUNTER — Other Ambulatory Visit: Payer: Self-pay | Admitting: Neurology

## 2019-06-04 ENCOUNTER — Encounter: Payer: Self-pay | Admitting: Adult Health

## 2019-06-04 ENCOUNTER — Other Ambulatory Visit: Payer: Self-pay

## 2019-06-04 ENCOUNTER — Ambulatory Visit: Payer: BC Managed Care – PPO | Admitting: Adult Health

## 2019-06-04 ENCOUNTER — Other Ambulatory Visit: Payer: Self-pay | Admitting: Neurology

## 2019-06-04 VITALS — BP 126/86 | HR 78 | Temp 97.1°F | Ht 64.0 in | Wt 228.0 lb

## 2019-06-04 DIAGNOSIS — Z9989 Dependence on other enabling machines and devices: Secondary | ICD-10-CM

## 2019-06-04 DIAGNOSIS — Z9889 Other specified postprocedural states: Secondary | ICD-10-CM | POA: Diagnosis not present

## 2019-06-04 DIAGNOSIS — G4733 Obstructive sleep apnea (adult) (pediatric): Secondary | ICD-10-CM

## 2019-06-04 NOTE — Progress Notes (Signed)
PATIENT: Desiree Dunn DOB: 04-07-1971  REASON FOR VISIT: follow up HISTORY FROM: patient  HISTORY OF PRESENT ILLNESS: Today 06/04/19:  [name] is a 48 year old female with a history of obstructive sleep apnea on CPAP.  She states that in December she had her deviated septum repair.  She reports that since then she has not been using the CPAP consistently.  She states that she has a hard time wearing the full facemask.  She also notes that her husband states that she does not snore nearly as much since she had the surgery.  Her Epworth sleepiness score is 3 and fatigue severity score is 23.  She returns today for follow-up.  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  There was primary loud snoring and oral breathing.  She appeared rather restless.  The study included a attempt to treat her with CPAP therapy and the CPAP titration part was incomplete as central apneas emerged.  I therefore ordered an auto titration CPAP I also referred her for ENT surgery and recommended a medically supervised weight loss program. She received an auto titration CPAP between 5 and 16 cmH2O with 3 cm EPR, her average user time on days used is 5 hours and 43 minutes but she was unable to use the CPAP machine since the 15th of this month due to nasal congestion and sinusitis.  She had minor air leaks the 95th percentile pressure was only 10 cmH2O and the AHI was reduced to 2.1.  I consider the settings as working very well for her, but she certainly will benefit from a nasal septum surgical correction.  In addition we reviewed today her current fatigue and sleepiness score her Epworth sleepiness score on CPAP is down to 5 points from previously 9 her  fatigue severity however remains high which also may be stress-induced.  REVIEW OF SYSTEMS: Out of a complete 14 system review of symptoms, the patient complains only of the following symptoms, and all other reviewed systems are negative.  See HPI  ALLERGIES: Allergies  Allergen Reactions   Amoxicillin Hives    Hives    HOME MEDICATIONS: Outpatient Medications Prior to Visit  Medication Sig Dispense Refill   acetaminophen (TYLENOL) 500 MG tablet Take 1,000 mg by mouth as needed for mild pain or headache.     albuterol (VENTOLIN HFA) 108 (90 Base) MCG/ACT inhaler Inhale 2 puffs into the lungs every 6 (six) hours as needed for wheezing or shortness of breath. 1 Inhaler 2   fexofenadine (ALLEGRA) 180 MG tablet TAKE 1 TABLET BY MOUTH EVERY DAY 30 tablet 5   fluticasone (FLONASE) 50 MCG/ACT nasal spray SPRAY 2 SPRAYS INTO EACH NOSTRIL EVERY DAY     ibuprofen (ADVIL,MOTRIN) 200 MG tablet Take 400 mg by mouth as needed for moderate pain.     lisinopril (PRINIVIL,ZESTRIL) 10 MG tablet TAKE 1 TABLET BY MOUTH EVERY DAY 90 tablet 0   NOREL AD 4-10-325 MG TABS Take 1 tablet by mouth 2 (two) times daily as needed. 20 tablet 1   verapamil (CALAN-SR) 120 MG CR tablet TAKE 1 TABLET BY MOUTH EVERY DAY 90 tablet 0   No facility-administered medications prior to visit.     PAST MEDICAL HISTORY: Past Medical History:  Diagnosis Date  Allergy    Back pain    Chronic RLQ pain    since 1995 when she had ectopic preg   Constipation    Deviated septum    Diverticulitis    Ectopic pregnancy 1995    x 1 , R side    GERD (gastroesophageal reflux disease)    HTN (hypertension) 05/24/2015   Migraine without aura and without status migrainosus, not intractable    Miscarriage    x 1   Obesity, unspecified 07/06/2010   Pelvic congestion    h/o    Sleep apnea    uses cpap    PAST SURGICAL HISTORY: Past Surgical History:  Procedure Laterality Date   ABDOMINAL HYSTERECTOMY   12/2009   [facility]   BREAST REDUCTION SURGERY  12/09   COLONOSCOPY  2001   X3 ; diverticulosis   FOOT SURGERY     NASAL SEPTOPLASTY W/ TURBINOPLASTY Bilateral 10/31/2018   Procedure: NASAL SEPTOPLASTY WITH TURBINATE REDUCTION;  Surgeon: Jerrell Belfast, MD;  Location: [facility];  Service: ENT;  Laterality: Bilateral;   PARTIAL HYSTERECTOMY     REDUCTION MAMMAPLASTY Bilateral    RIGHT OOPHORECTOMY  12/2009   [facility]   TUBAL LIGATION  2000    FAMILY HISTORY: Family History  Problem Relation Age of Onset   Colon cancer Mother 38   Colon cancer Maternal Aunt 32   Stroke Father    Diabetes Father    Hypertension Father    Hyperlipidemia Father    Heart disease Father    Obesity Father    Hypertension Sister    Breast cancer Other        GM   Colon polyps Brother    Colitis Brother    Heart attack Neg Hx     SOCIAL HISTORY: Social History   Socioeconomic History   Marital status: Married    Spouse name: Charles   Number of children: 2   Years of education: Not on file   Highest education level: Not on file  Occupational History   Occupation: Counsellor, Par time   Occupation: works full time at [employer]: [place] resource strain: Not on file   Food insecurity    Worry: Not on file    Inability: Not on file   Transportation needs    Medical: Not on file    Non-medical: Not on file  Tobacco Use   Smoking status: Never Smoker   Smokeless tobacco: Never Used  Substance and Sexual Activity   Alcohol use: Yes    Alcohol/week: 0.0 standard drinks    Comment: socially - 1 drink every 3 months   Drug use: No   Sexual activity: Yes    Partners: Male    Birth control/protection: Surgical  Lifestyle   Physical activity    Days per week: Not on file    Minutes per session: Not on file   Stress: Not on file  Relationships   Social connections    Talks on phone: Not on file     Gets together: Not on file    Attends religious service: Not on file    Active member of club or organization: Not on file    Attends meetings of clubs or organizations: Not on file    Relationship status: Not on file   Intimate partner violence    Fear of current or ex partner: Not on file    Emotionally abused: Not on  file    Physically abused: Not on file    Forced sexual activity: Not on file  Other Topics Concern   Not on file  Social History Narrative   Household-- pt , husband and children   daughter 30   son 44      PHYSICAL EXAM  Vitals:   06/04/19 0758  BP: 126/86  Pulse: 78  Temp: (!) 97.1 F (36.2 C)  Weight: 228 lb (103.4 kg)  Height: 5\' 4"  (1.626 m)   Body mass index is 39.14 kg/m.  Generalized: Well developed, in no acute distress Chest: Lungs clear to auscultation bilaterally  Neurological examination  Mentation: Alert oriented to time, place, history taking. Follows all commands speech and language fluent Cranial nerve II-XII: Pupils were equal round reactive to light. Extraocular movements were full, visual field were full on confrontational test.  Head turning and shoulder shrug  were normal and symmetric. Motor: The motor testing reveals 5 over 5 strength of all 4 extremities. Good symmetric motor tone is noted throughout.  Sensory: Sensory testing is intact to soft touch on all 4 extremities. No evidence of extinction is noted.  Coordination: Cerebellar testing reveals good finger-nose-finger and heel-to-shin bilaterally.  Gait and station: Gait is normal.  Reflexes: Deep tendon reflexes are symmetric and normal bilaterally.   DIAGNOSTIC DATA (LABS, IMAGING, TESTING) - I reviewed patient records, labs, notes, testing and imaging myself where available.  Lab Results  Component Value Date   WBC 5.2 04/22/2019   HGB 13.5 04/22/2019   HCT 40.4 04/22/2019   MCV 90 04/22/2019   PLT 216 04/22/2019      Component Value Date/Time   NA 138  04/22/2019 1213   K 4.2 04/22/2019 1213   CL 104 04/22/2019 1213   CO2 23 04/22/2019 1213   GLUCOSE 80 04/22/2019 1213   GLUCOSE 91 10/23/2018 0831   BUN 8 04/22/2019 1213   CREATININE 0.80 04/22/2019 1213   CALCIUM 9.4 04/22/2019 1213   PROT 7.3 04/22/2019 1213   ALBUMIN 4.6 04/22/2019 1213   AST 26 04/22/2019 1213   ALT 28 04/22/2019 1213   ALKPHOS 71 04/22/2019 1213   BILITOT 0.4 04/22/2019 1213   GFRNONAA 87 04/22/2019 1213   GFRAA 100 04/22/2019 1213   Lab Results  Component Value Date   CHOL 202 (H) 04/22/2019   HDL 68 04/22/2019   LDLCALC 109 (H) 04/22/2019   TRIG 123 04/22/2019   CHOLHDL 3.0 04/22/2019   Lab Results  Component Value Date   HGBA1C 5.5 04/22/2019   Lab Results  Component Value Date   VITAMINB12 327 08/27/2018   Lab Results  Component Value Date   TSH 1.140 08/27/2018      ASSESSMENT AND PLAN 48 y.o. year old female  has a past medical history of Allergy, Back pain, Chronic RLQ pain, Constipation, Deviated septum, Diverticulitis, Ectopic pregnancy (1995 ), GERD (gastroesophageal reflux disease), HTN (hypertension) (05/24/2015), Migraine without aura and without status migrainosus, not intractable, Miscarriage, Obesity, unspecified (07/06/2010), Pelvic congestion, and Sleep apnea. here with:  1.  Obstructive sleep apnea on CPAP  I will order a repeat home sleep test to evaluate apnea after correction of deviated septum.  Patient is amenable to this plan.  I did advise that if her sleep test still shows significant apnea we will send her for mask refitting and get her restarted on the CPAP.  She voiced understanding.  She will follow-up in 6 months or sooner if needed.   I spent  15 minutes with the patient. 50% of this time was spent discussing treatment plan  Ward Givens, MSN, NP-C 06/04/2019, 8:28 AM [facility] [address] [place], [place] [address] [phone]

## 2019-06-04 NOTE — Patient Instructions (Signed)
Your Plan:  Home sleep test to look for apnea since surgery If your symptoms worsen or you develop new symptoms please let us know.   Thank you for coming to see [place] at [facility]. I hope we have been able to provide you high quality care today.  You may receive a patient satisfaction survey over the next few weeks. We would appreciate your feedback and comments so that we may continue to improve ourselves and the health of our patients.

## 2019-06-24 ENCOUNTER — Telehealth: Payer: Self-pay

## 2019-06-24 ENCOUNTER — Other Ambulatory Visit: Payer: Self-pay

## 2019-06-24 ENCOUNTER — Ambulatory Visit (INDEPENDENT_AMBULATORY_CARE_PROVIDER_SITE_OTHER): Payer: BC Managed Care – PPO | Admitting: Neurology

## 2019-06-24 DIAGNOSIS — G4733 Obstructive sleep apnea (adult) (pediatric): Secondary | ICD-10-CM

## 2019-06-24 NOTE — Telephone Encounter (Signed)
Verbal consent given for virtual visit  

## 2019-06-25 ENCOUNTER — Other Ambulatory Visit: Payer: Self-pay

## 2019-06-25 ENCOUNTER — Ambulatory Visit (INDEPENDENT_AMBULATORY_CARE_PROVIDER_SITE_OTHER): Payer: BC Managed Care – PPO | Admitting: Nurse Practitioner

## 2019-06-25 ENCOUNTER — Encounter: Payer: Self-pay | Admitting: Nurse Practitioner

## 2019-06-25 VITALS — Temp 97.2°F | Ht 64.5 in | Wt 228.0 lb

## 2019-06-25 DIAGNOSIS — J3489 Other specified disorders of nose and nasal sinuses: Secondary | ICD-10-CM

## 2019-06-25 DIAGNOSIS — J3089 Other allergic rhinitis: Secondary | ICD-10-CM

## 2019-06-25 MED ORDER — PREDNISONE 10 MG (21) PO TBPK: ORAL_TABLET | ORAL | 0 refills | Status: DC

## 2019-06-25 MED ORDER — LEVOCETIRIZINE DIHYDROCHLORIDE 5 MG PO TABS: 5.0000 mg | ORAL_TABLET | Freq: Every evening | ORAL | 1 refills | Status: DC

## 2019-06-25 MED ORDER — MOMETASONE FUROATE 50 MCG/ACT NA SUSP: 2.0000 | Freq: Every day | NASAL | 2 refills | Status: DC

## 2019-06-25 NOTE — Patient Instructions (Signed)
Sinus Headache  A sinus headache occurs when your sinuses become clogged or swollen. Sinuses are air-filled spaces in your skull that are behind the bones of your face and forehead. Sinus headaches can range from mild to severe. What are the causes? A sinus headache can result from various conditions that affect the sinuses. Common causes include:  Colds.  Sinus infections.  Allergies. Many people confuse sinus headaches with migraines or tension headaches because those headaches can also cause facial pain and nasal symptoms. What are the signs or symptoms? The main symptom of this condition is a headache that may feel like pain or pressure in your face, forehead, ears, or upper teeth. People who have a sinus headache often have other symptoms, such as:  Congested or runny nose.  Fever.  Inability to smell. Weather changes can make symptoms worse. How is this diagnosed? This condition may be diagnosed based on:  A physical exam and medical history.  Imaging tests, such as a CT scan or MRI, to check for problems with the sinuses.  Examination of the sinuses using a thin tool with a camera that is inserted through your nose (endoscopy). How is this treated? Treatment for this condition depends on the cause.  Sinus pain that is caused by a sinus infection may be treated with antibiotic medicine.  Sinus pain that is caused by allergies may be helped by allergy medicines (antihistamines) and medicated nasal sprays.  Sinus pain that is caused by congestion may be helped by rinsing out (flushing) the nose and sinuses with saline solution.  Sinus surgery may be needed in some cases if other treatments do not help. Follow these instructions at home: General instructions  If directed: ? Apply a warm, moist washcloth to your face to help relieve pain. ? Use a nasal saline wash. Medicines   Take over-the-counter and prescription medicines only as told by your health care provider.   If you were prescribed an antibiotic medicine, take it as told by your health care provider. Do not stop taking the antibiotic even if you start to feel better.  If you have congestion, use a nasal spray to help lessen pressure. Hydrate and humidify  Drink enough water to keep your urine clear or pale yellow. Staying hydrated will help to thin your mucus.  Use a cool mist humidifier to keep the humidity level in your home above 50%.  Inhale steam for 10-15 minutes, 3-4 times a day or as told by your health care provider. You can do this in the bathroom while a hot shower is running.  Limit your exposure to cool or dry air. Contact a health care provider if:  You have a headache more than one time a week.  You have sensitivity to light or sound.  You develop a fever.  You feel nauseous or you vomit.  Your headaches do not get better with treatment. Many people think that they have a sinus headache when they actually have a migraine or a tension headache. Get help right away if:  You have vision problems.  You have sudden, severe pain in your face or head.  You have a seizure.  You are confused.  You have a stiff neck. Summary  A sinus headache occurs when your sinuses become clogged or swollen.  A sinus headache can result from various conditions that affect the sinuses, such as a cold, a sinus infection, or an allergy.  Treatment for this condition depends on the cause. It may include   medicine, such as antibiotics or antihistamines. This information is not intended to replace advice given to you by your health care provider. Make sure you discuss any questions you have with your health care provider. Document Released: 12/06/2004 Document Revised: 10/11/2017 Document Reviewed: 08/09/2017 Elsevier Patient Education  2020 Elsevier Inc.  

## 2019-06-25 NOTE — Progress Notes (Signed)
Virtual Visit via video   This visit type was conducted due to national recommendations for restrictions regarding the COVID-19 Pandemic (e.g. social distancing) in an effort to limit this patient's exposure and mitigate transmission in our community.  Due to her co-morbid illnesses, this patient is at least at moderate risk for complications without adequate follow up.  This format is felt to be most appropriate for this patient at this time.  All issues noted in this document were discussed and addressed.  A limited physical exam was performed with this format.    This visit type was conducted due to national recommendations for restrictions regarding the COVID-19 Pandemic (e.g. social distancing) in an effort to limit this patient's exposure and mitigate transmission in our community.  Patients identity confirmed using two different identifiers.  This format is felt to be most appropriate for this patient at this time.  All issues noted in this document were discussed and addressed.  No physical exam was performed (except for noted visual exam findings with Video Visits).    Date:  06/30/2019   ID:  Desiree Dunn, DOB 04/14/1971, MRN [id number]  Patient Location:  Work  Provider location:   [employer] Complaint:  Sinus pressure  History of Present Illness:    Desiree Dunn is a 48 y.o. female who presents via video conferencing for a telehealth visit today.    The patient does not have symptoms concerning for COVID-19 infection (fever, chills, cough, or new shortness of breath).   Allegra and flonase Has been using steam which relieved some of the pressure.  Not sneezing and nothing is coming up.  Sinus pressure.  She works at [employer].  She is being checked at the door.  No exposure to anyone she knows with COVID.    Sinus Problem This is a recurrent problem. The current episode started more than 1 year ago. The problem has been gradually  worsening since onset. There has been no fever. She is experiencing no pain. Associated symptoms include sinus pressure. Pertinent negatives include no chills, congestion, coughing, headaches, neck pain, shortness of breath or sneezing.     Past Medical History:  Diagnosis Date  . Allergy   . Back pain   . Chronic RLQ pain    since 1995 when she had ectopic preg  . Constipation   . Deviated septum   . Diverticulitis   . Ectopic pregnancy 1995    x 1 , R side   . GERD (gastroesophageal reflux disease)   . HTN (hypertension) 05/24/2015  . Migraine without aura and without status migrainosus, not intractable   . Miscarriage    x 1  . Obesity, unspecified 07/06/2010  . Pelvic congestion    h/o   . Sleep apnea    uses cpap   Past Surgical History:  Procedure Laterality Date  . ABDOMINAL [facility]STERECTOMY  12/2009   [facility]  . BREAST REDUCTION SURGERY  12/09  . COLONOSCOPY  2001   X3 ; diverticulosis  . FOOT SURGERY    . NASAL SEPTOPLASTY W/ TURBINOPLASTY Bilateral 10/31/2018   Procedure: NASAL SEPTOPLASTY WITH TURBINATE REDUCTION;  Surgeon: Jerrell Belfast, MD;  Location: [facility];  Service: ENT;  Laterality: Bilateral;  . PARTIAL HYSTERECTOMY    . REDUCTION MAMMAPLASTY Bilateral   . RIGHT OOPHORECTOMY  12/2009   [facility]  . TUBAL LIGATION  2000     Current Meds  Medication Sig  . acetaminophen (TYLENOL) 500 MG tablet  Take 1,000 mg by mouth as needed for mild pain or headache.  . albuterol (VENTOLIN HFA) 108 (90 Base) MCG/ACT inhaler Inhale 2 puffs into the lungs every 6 (six) hours as needed for wheezing or shortness of breath.  . fexofenadine (ALLEGRA) 180 MG tablet TAKE 1 TABLET BY MOUTH EVERY DAY  . fluticasone (FLONASE) 50 MCG/ACT nasal spray SPRAY 2 SPRAYS INTO EACH NOSTRIL EVERY DAY  . ibuprofen (ADVIL,MOTRIN) 200 MG tablet Take 400 mg by mouth as needed for moderate pain.  [name] lisinopril (PRINIVIL,ZESTRIL) 10 MG tablet TAKE 1 TABLET BY MOUTH EVERY DAY  . verapamil (CALAN-SR)  120 MG CR tablet TAKE 1 TABLET BY MOUTH EVERY DAY     Allergies:   Amoxicillin   Social History   Tobacco Use  . Smoking status: Never Smoker  . Smokeless tobacco: Never Used  Substance Use Topics  . Alcohol use: Yes    Alcohol/week: 0.0 standard drinks    Comment: socially - 1 drink every 3 months  . Drug use: No     Family Hx: The patient's family history includes Breast cancer in an other family member; Colitis in her brother; Colon cancer (age of onset: 34) in her maternal aunt; Colon cancer (age of onset: 39) in her mother; Colon polyps in her brother; Diabetes in her father; Heart disease in her father; Hyperlipidemia in her father; Hypertension in her father and sister; Obesity in her father; Stroke in her father. There is no history of Heart attack.  ROS:   Please see the history of present illness.    Review of Systems  Constitutional: Negative for chills.  HENT: Positive for sinus pressure. Negative for congestion and sneezing.   Respiratory: Negative for cough and shortness of breath.   Musculoskeletal: Negative for neck pain.  Neurological: Negative for headaches.    All other systems reviewed and are negative.   Labs/Other Tests and Data Reviewed:    Recent Labs: 08/27/2018: TSH 1.140 04/22/2019: ALT 28; BUN 8; Creatinine, Ser 0.80; Hemoglobin 13.5; Platelets 216; Potassium 4.2; Sodium 138   Recent Lipid Panel Lab Results  Component Value Date/Time   CHOL 202 (H) 04/22/2019 12:13 PM   TRIG 123 04/22/2019 12:13 PM   HDL 68 04/22/2019 12:13 PM   CHOLHDL 3.0 04/22/2019 12:13 PM   CHOLHDL 3 01/14/2017 03:29 PM   LDLCALC 109 (H) 04/22/2019 12:13 PM    Wt Readings from Last 3 Encounters:  06/25/19 228 lb (103.4 kg)  06/04/19 228 lb (103.4 kg)  05/20/19 225 lb (102.1 kg)     Exam:    Vital Signs:  Temp (!) 97.2 F (36.2 C) (Oral)   Ht 5' 4.5" (1.638 m)   Wt 228 lb (103.4 kg)   BMI 38.53 kg/m     Physical Exam  Constitutional: She is oriented to  person, place, and time and well-developed, well-nourished, and in no distress.  Pulmonary/Chest: Effort normal.  Neurological: She is alert and oriented to person, place, and time.  Psychiatric: Memory, affect and judgment normal.    ASSESSMENT & PLAN:     1. Sinus pressure  Will treat with prednisone taper to decrease risk for full blown sinus infection  She is encouraged to take the nasonex and xyzal daily - predniSONE (STERAPRED UNI-PAK 21 TAB) 10 MG (21) TBPK tablet; Take as directed  Dispense: 21 tablet; Refill: 0 - mometasone (NASONEX) 50 MCG/ACT nasal spray; Place 2 sprays into the nose daily.  Dispense: 17 g; Refill: 2 - levocetirizine (XYZAL)  5 MG tablet; Take 1 tablet (5 mg total) by mouth every evening.  Dispense: 90 tablet; Refill: 1  2. Non-seasonal allergic rhinitis, unspecified trigger  Take antihistamines as directed  Avoid triggers if possible - mometasone (NASONEX) 50 MCG/ACT nasal spray; Place 2 sprays into the nose daily.  Dispense: 17 g; Refill: 2 - levocetirizine (XYZAL) 5 MG tablet; Take 1 tablet (5 mg total) by mouth every evening.  Dispense: 90 tablet; Refill: 1   COVID-19 Education: The signs and symptoms of COVID-19 were discussed with the patient and how to seek care for testing (follow up with PCP or arrange E-visit).  The importance of social distancing was discussed today.  Patient Risk:   After full review of this patients clinical status, I feel that they are at least moderate risk at this time.   Medication Adjustments/Labs and Tests Ordered: Current medicines are reviewed at length with the patient today.  Concerns regarding medicines are outlined above.   Tests Ordered: No orders of the defined types were placed in this encounter.   Medication Changes: Meds ordered this encounter  Medications  . predniSONE (STERAPRED UNI-PAK 21 TAB) 10 MG (21) TBPK tablet    Sig: Take as directed    Dispense:  21 tablet    Refill:  0  . mometasone  (NASONEX) 50 MCG/ACT nasal spray    Sig: Place 2 sprays into the nose daily.    Dispense:  17 g    Refill:  2  . levocetirizine (XYZAL) 5 MG tablet    Sig: Take 1 tablet (5 mg total) by mouth every evening.    Dispense:  90 tablet    Refill:  1    Disposition:  Follow up prn  Signed, Minette Brine, FNP

## 2019-07-08 NOTE — Procedures (Signed)
  Patient Information     First Name: Desiree Last Name: Caro Dunn ID: [id number]  Birth Date: Apr 13, 1971 Age: 48 Gender: Female  Referring Provider: Ward Givens, NP BMI: 39.1 (W=229 lb, H=5' 4'')  Neck Circ.:  17" Epworth: 3/24   Sleep Study Information    Study Date: Jun 24, 2019 S/H/A Version: 001.001.001.001 / 4.1.1528 / 31  History:    Desiree Dunn is a 48 year old female with a history of obstructive sleep apnea on CPAP.  She states that in December she had her deviated septum repaired.  She reports that since then she has not been using the CPAP consistently.  She states that she has a hard time wearing the full face mask.  She also states that she does not snore nearly as much since she had the surgery.               Summary & Diagnosis:    Severe sleep apnea persists at AHI of 33.6/h and is NREM sleep dominated.   Recommendations:     This high degree of apnea still warrants CPAP therapy.  Electronically Signed: Larey Seat, MD                         Start Study Time: End Study Time: Total Recording Time:  11:09:39 PM   6:30:54 AM   7 h, 21 min  Total Sleep Time % REM of Sleep Time:  5 h, 41 min  23.2    Mean: 96 Minimum: 79 Maximum: 100  Mean of Desaturations Nadirs (%):   92  Oxygen Desaturation. %:   4-9 10-20 >20 Total  Events Number Total    97  12 89.0 11.0  0 0.0  109 100.0  Oxygen Saturation: <90 <=88 <85 <80 <70  Duration (minutes): Sleep % 2.4 0.7  1.8 0.6  0.5 0.2 0.1 0.0 0.0 0.0     Respiratory Indices       Total Events REM NREM All Night  pRDI: pAHI: ODI:  193  187  109 29.5 26.3 16.0 36.2 35.8 20.7 34.7 33.6 19.6       Pulse Rate Statistics during Sleep (BPM)      Mean: 72 Minimum: 45 Maximum: 102    Oxygen Saturation Statistics   Sleep Summary   Indices are calculated using technically valid sleep time of  5 hrs, 33 min. pRDI/pAHI are calculated using oxi desaturations ?  3%

## 2019-07-09 ENCOUNTER — Telehealth: Payer: Self-pay | Admitting: *Deleted

## 2019-07-09 DIAGNOSIS — G43009 Migraine without aura, not intractable, without status migrainosus: Secondary | ICD-10-CM

## 2019-07-09 DIAGNOSIS — E8881 Metabolic syndrome: Secondary | ICD-10-CM

## 2019-07-09 DIAGNOSIS — G4733 Obstructive sleep apnea (adult) (pediatric): Secondary | ICD-10-CM

## 2019-07-09 NOTE — Telephone Encounter (Signed)
Called patient and informed her Dr Brett Fairy stated her sleep study shows severe sleep apnea persists. I advised Dr Brett Fairy can order new machine. Patient stated she has a machine, but the full face mask isn't working well for her. Per NP's note in July, [facility] will be notified that patient needs mask refitting and restart of her CPAP now that sleep study is complete. I advised her I will send [facility] a note and they'll be in touch with her. Patient verbalized understanding, appreciation.

## 2019-07-12 ENCOUNTER — Other Ambulatory Visit: Payer: Self-pay | Admitting: Internal Medicine

## 2019-07-12 NOTE — Addendum Note (Signed)
Addended by: Larey Seat on: 07/12/2019 01:00 PM   Modules accepted: Orders

## 2019-07-12 NOTE — Telephone Encounter (Signed)
Desiree Dunn, The pressures to be set are 6-16 cm water with 3 cm EPR , she just needs to be refitted for mask. That's a DME order . CD

## 2019-07-14 ENCOUNTER — Telehealth: Payer: Self-pay | Admitting: Neurology

## 2019-07-14 DIAGNOSIS — E6609 Other obesity due to excess calories: Secondary | ICD-10-CM

## 2019-07-14 DIAGNOSIS — G43009 Migraine without aura, not intractable, without status migrainosus: Secondary | ICD-10-CM

## 2019-07-14 DIAGNOSIS — J343 Hypertrophy of nasal turbinates: Secondary | ICD-10-CM

## 2019-07-14 DIAGNOSIS — G4733 Obstructive sleep apnea (adult) (pediatric): Secondary | ICD-10-CM

## 2019-07-14 DIAGNOSIS — E8881 Metabolic syndrome: Secondary | ICD-10-CM

## 2019-07-14 NOTE — Telephone Encounter (Signed)
Order for auto CPAP 6-16 cm water 1 cm EPR , heated humidity ,mask of her choice.

## 2019-07-14 NOTE — Telephone Encounter (Signed)
I have already sent this order to [facility].

## 2019-07-14 NOTE — Telephone Encounter (Signed)
Patients HST was positive for severe apnea with an  AHI close to 33/h . The patient indicated she needs a new machine.   Order for auto CPAP 6-16 cm water 1 cm EPR , heated humidity ,mask of her choice.

## 2019-07-14 NOTE — Telephone Encounter (Signed)
I have sent this order to [facility].

## 2019-07-19 ENCOUNTER — Other Ambulatory Visit: Payer: Self-pay | Admitting: Internal Medicine

## 2019-07-19 ENCOUNTER — Encounter: Payer: Self-pay | Admitting: Internal Medicine

## 2019-07-22 ENCOUNTER — Other Ambulatory Visit: Payer: Self-pay

## 2019-07-22 ENCOUNTER — Encounter: Payer: Self-pay | Admitting: Internal Medicine

## 2019-07-22 ENCOUNTER — Ambulatory Visit (INDEPENDENT_AMBULATORY_CARE_PROVIDER_SITE_OTHER): Payer: BC Managed Care – PPO | Admitting: Internal Medicine

## 2019-07-22 VITALS — BP 144/90 | HR 85 | Temp 98.3°F | Ht 64.4 in | Wt 221.8 lb

## 2019-07-22 DIAGNOSIS — F5101 Primary insomnia: Secondary | ICD-10-CM

## 2019-07-22 DIAGNOSIS — J0100 Acute maxillary sinusitis, unspecified: Secondary | ICD-10-CM

## 2019-07-22 DIAGNOSIS — I10 Essential (primary) hypertension: Secondary | ICD-10-CM

## 2019-07-22 DIAGNOSIS — G44209 Tension-type headache, unspecified, not intractable: Secondary | ICD-10-CM | POA: Diagnosis not present

## 2019-07-22 MED ORDER — FLUCONAZOLE 150 MG PO TABS: ORAL_TABLET | ORAL | 0 refills | Status: DC

## 2019-07-22 MED ORDER — AZITHROMYCIN 250 MG PO TABS: ORAL_TABLET | ORAL | 0 refills | Status: AC

## 2019-07-22 MED ORDER — OLMESARTAN MEDOXOMIL 20 MG PO TABS: 20.0000 mg | ORAL_TABLET | Freq: Every day | ORAL | 1 refills | Status: DC

## 2019-07-22 MED ORDER — TEMAZEPAM 15 MG PO CAPS: 15.0000 mg | ORAL_CAPSULE | Freq: Every evening | ORAL | 0 refills | Status: DC | PRN

## 2019-07-22 NOTE — Progress Notes (Signed)
Subjective:     Patient ID: Desiree Dunn , female    DOB: [birth year] , 48 y.o.   MRN: [id number]   Chief Complaint  Patient presents with  . Hypertension    HPI  She is here today for bp f/u. She reports she has been having headaches for the past two weeks. She bought wrist bp cuff, bp readings have been 140s/100s.  She denies chest pain and shortness of breath. Denies visual disturbances. Reports compliance with meds. Not yet compliant with CPAP b/c she has yet to find a well-fitting mask. She admits she has been under a great deal of stress at work. 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   Hypertension This is a chronic problem. The current episode started more than 1 year ago. The problem has been gradually worsening since onset. The problem is uncontrolled. Associated symptoms include headaches. Pertinent negatives include no blurred vision, chest pain, palpitations or shortness of breath. Risk factors for coronary artery disease include obesity and sedentary lifestyle. Past treatments include ACE inhibitors. The current treatment provides moderate improvement. Compliance problems include exercise.      Past Medical History:  Diagnosis Date  . Allergy   . Back pain   . Chronic RLQ pain    since 1995 when she had ectopic preg  . Constipation   . Deviated septum   . Diverticulitis   . Ectopic pregnancy 1995    x 1 , R side   . GERD (gastroesophageal reflux disease)   . HTN (hypertension) 05/24/2015  . Migraine without aura and without status migrainosus, not intractable   . Miscarriage    x 1  . Obesity, unspecified 07/06/2010  . Pelvic congestion    h/o   . Sleep apnea    uses cpap     Family History  Problem Relation Age of Onset  . Colon cancer Mother 27  . Colon cancer Maternal Aunt 64  . Stroke Father   . Diabetes Father   . Hypertension Father   . Hyperlipidemia Father   .  Heart disease Father   . Obesity Father   . Hypertension Sister   . Breast cancer Other        GM  . Colon polyps Brother   . Colitis Brother   . Heart attack Neg Hx      Current Outpatient Medications:  .  acetaminophen (TYLENOL) 500 MG tablet, Take 1,000 mg by mouth as needed for mild pain or headache., Disp: , Rfl:  .  albuterol (VENTOLIN HFA) 108 (90 Base) MCG/ACT inhaler, Inhale 2 puffs into the lungs every 6 (six) hours as needed for wheezing or shortness of breath., Disp: 1 Inhaler, Rfl: 2 .  fexofenadine (ALLEGRA) 180 MG tablet, TAKE 1 TABLET BY MOUTH EVERY DAY, Disp: 30 tablet, Rfl: 5 .  fluticasone (FLONASE) 50 MCG/ACT nasal spray, SPRAY 2 SPRAYS INTO EACH NOSTRIL EVERY DAY, Disp: , Rfl:  .  ibuprofen (ADVIL,MOTRIN) 200 MG tablet, Take 400 mg by mouth as needed for moderate pain., Disp: , Rfl:  .  levocetirizine (XYZAL) 5 MG tablet, Take 1 tablet (5 mg total) by mouth every evening., Disp: 90 tablet, Rfl: 1 .  mometasone (NASONEX) 50 MCG/ACT nasal spray, Place 2 sprays into the nose daily., Disp: 17 g, Rfl: 2 .  verapamil (CALAN-SR) 120 MG CR tablet, TAKE 1 TABLET BY MOUTH EVERY DAY, Disp: 90 tablet, Rfl: 0 .  azithromycin (ZITHROMAX Z-PAK) 250 MG tablet, Take 2 tablets (500 mg) on  Day 1,  followed by 1 tablet (250 mg) once daily on Days 2 through 5., Disp: 6 each, Rfl: 0 .  fluconazole (DIFLUCAN) 150 MG tablet, pls take one tab po today, repeat in 2 days, Disp: 2 tablet, Rfl: 0 .  NOREL AD 4-10-325 MG TABS, Take 1 tablet by mouth 2 (two) times daily as needed. (Patient not taking: Reported on 06/25/2019), Disp: 20 tablet, Rfl: 1 .  olmesartan (BENICAR) 20 MG tablet, Take 1 tablet (20 mg total) by mouth daily., Disp: 30 tablet, Rfl: 1 .  predniSONE (STERAPRED UNI-PAK 21 TAB) 10 MG (21) TBPK tablet, Take as directed, Disp: 21 tablet, Rfl: 0 .  temazepam (RESTORIL) 15 MG capsule, Take 1 capsule (15 mg total) by mouth at bedtime as needed for sleep., Disp: 30 capsule, Rfl: 0    Allergies  Allergen Reactions  . Amoxicillin Hives    Hives     Review of Systems  Constitutional: Negative.   HENT: Positive for congestion, postnasal drip, sinus pressure and sinus pain.        She c/o facial pain and sinus pressure.   Eyes: Negative for blurred vision.  Respiratory: Negative.  Negative for shortness of breath.   Cardiovascular: Negative.  Negative for chest pain and palpitations.  Gastrointestinal: Negative.   Neurological: Positive for headaches.  Psychiatric/Behavioral: Negative.      Today's Vitals   07/22/19 1018  BP: (!) 144/90  Pulse: 85  Temp: 98.3 F (36.8 C)  TempSrc: Oral  SpO2: 97%  Weight: 221 lb 12.8 oz (100.6 kg)  Height: 5' 4.4" (1.636 m)   Body mass index is 37.6 kg/m.   Objective:  Physical Exam Vitals signs and nursing note reviewed.  Constitutional:      Appearance: Normal appearance.     Comments: Looks ill  HENT:     Head: Normocephalic and atraumatic.     Comments: Maxillary sinus tenderness to percussion    Right Ear: Tympanic membrane, ear canal and external ear normal.     Left Ear: Tympanic membrane, ear canal and external ear normal.     Mouth/Throat:     Pharynx: Posterior oropharyngeal erythema present.  Neck:     Comments: B/l posterior neck tenderness to palpation. Bilateral trapezius mm. Tenderness to palpation Cardiovascular:     Rate and Rhythm: Normal rate and regular rhythm.     Heart sounds: Normal heart sounds.  Pulmonary:     Effort: Pulmonary effort is normal.     Breath sounds: Normal breath sounds.  Skin:    General: Skin is warm.  Neurological:     General: No focal deficit present.     Mental Status: She is alert.  Psychiatric:        Mood and Affect: Mood normal.        Behavior: Behavior normal.         Assessment And Plan:     1. Essential hypertension  Uncontrolled. I will d/c lisinopril. Pt advised that wrist monitors tend to read bp inaccurately. I will switch her to  olmesartan 20mg  daily. She agrees to rto in four weeks for re-evauation. I plan to check bmp at that time.   2. Acute non-recurrent maxillary sinusitis.   She was given rx zpak. She reports amoxicillin allergy. She was given rx diflucan to use prn vulvovaginitis due to abx use. She is encouraged to contact me in 48 hours to  let me know how she is doing.   3. Primary insomnia  She was given rx temazepam 15mg  to use nightly as needed. I think her sx are exacerbated by perimenopause.   4. Tension headache  She is advised to apply Vicks/muscle rub to neck/shoulders and temples tonight. She may benefit from magnesium supplementation nightly.   [name], MD    THE PATIENT IS ENCOURAGED TO PRACTICE SOCIAL DISTANCING DUE TO THE COVID-19 PANDEMIC.

## 2019-07-22 NOTE — Patient Instructions (Addendum)
Tension Headache, Adult A tension headache is pain, pressure, or aching in your head. Tension headaches can last from 30 minutes to several days. Follow these instructions at home: Managing pain  Take over-the-counter and prescription medicines only as told by your doctor.  When you have a headache, lie down in a dark, quiet room.  If told, put ice on your head and neck: ? Put ice in a plastic bag. ? Place a towel between your skin and the bag. ? Leave the ice on for 20 minutes, 2-3 times a day.  If told, put heat on the back of your neck. Do this as often as your doctor tells you to. Use the kind of heat that your doctor recommends, such as a moist heat pack or a heating pad. ? Place a towel between your skin and the heat. ? Leave the heat on for 20-30 minutes. ? Remove the heat if your skin turns bright red. Eating and drinking  Eat meals on a regular schedule.  Watch how much alcohol you drink: ? If you are a woman and are not pregnant, do not drink more than 1 drink a day. ? If you are a man, do not drink more than 2 drinks a day.  Drink enough fluid to keep your pee (urine) pale yellow.  Do not use a lot of caffeine, or stop using caffeine. Lifestyle  Get enough sleep. Get 7-9 hours of sleep each night. Or get the amount of sleep that your doctor tells you to.  At bedtime, remove all electronic devices from your room. Examples of electronic devices are computers, phones, and tablets.  Find ways to lessen your stress. Some things that can lessen stress are: ? Exercise. ? Deep breathing. ? Yoga. ? Music. ? Positive thoughts.  Sit up straight. Do not tighten (tense) your muscles.  Do not use any products that have nicotine or tobacco in them, such as cigarettes and e-cigarettes. If you need help quitting, ask your doctor. General instructions   Keep all follow-up visits as told by your doctor. This is important.  Avoid things that can bring on headaches. Keep a  journal to find out if certain things bring on headaches. For example, write down: ? What you eat and drink. ? How much sleep you get. ? Any change to your diet or medicines. Contact a doctor if:  Your headache does not get better.  Your headache comes back.  You have a headache and sounds, light, or smells bother you.  You feel sick to your stomach (nauseous) or you throw up (vomit).  Your stomach hurts. Get help right away if:  You suddenly get a very bad headache along with any of these: ? A stiff neck. ? Feeling sick to your stomach. ? Throwing up. ? Feeling weak. ? Trouble seeing. ? Feeling short of breath. ? A rash. ? Feeling unusually sleepy. ? Trouble speaking. ? Pain in your eye or ear. ? Trouble walking or balancing. ? Feeling like you will pass out (faint). ? Passing out. Summary  A tension headache is pain, pressure, or aching in your head.  Tension headaches can last from 30 minutes to several days.  Lifestyle changes and medicines may help relieve pain. This information is not intended to replace advice given to you by your health care provider. Make sure you discuss any questions you have with your health care provider. Document Released: 01/23/2010 Document Revised: 10/11/2017 Document Reviewed: 02/08/2017 Elsevier Patient Education  2020 Elsevier   Inc.    Insomnia Insomnia is a sleep disorder that makes it difficult to fall asleep or stay asleep. Insomnia can cause fatigue, low energy, difficulty concentrating, mood swings, and poor performance at work or school. There are three different ways to classify insomnia:  Difficulty falling asleep.  Difficulty staying asleep.  Waking up too early in the morning. Any type of insomnia can be long-term (chronic) or short-term (acute). Both are common. Short-term insomnia usually lasts for three months or less. Chronic insomnia occurs at least three times a week for longer than three months. What are the  causes? Insomnia may be caused by another condition, situation, or substance, such as:  Anxiety.  Certain medicines.  Gastroesophageal reflux disease (GERD) or other gastrointestinal conditions.  Asthma or other breathing conditions.  Restless legs syndrome, sleep apnea, or other sleep disorders.  Chronic pain.  Menopause.  Stroke.  Abuse of alcohol, tobacco, or illegal drugs.  Mental health conditions, such as depression.  Caffeine.  Neurological disorders, such as Alzheimer's disease.  An overactive thyroid (hyperthyroidism). Sometimes, the cause of insomnia may not be known. What increases the risk? Risk factors for insomnia include:  Gender. Women are affected more often than men.  Age. Insomnia is more common as you get older.  Stress.  Lack of exercise.  Irregular work schedule or working night shifts.  Traveling between different time zones.  Certain medical and mental health conditions. What are the signs or symptoms? If you have insomnia, the main symptom is having trouble falling asleep or having trouble staying asleep. This may lead to other symptoms, such as:  Feeling fatigued or having low energy.  Feeling nervous about going to sleep.  Not feeling rested in the morning.  Having trouble concentrating.  Feeling irritable, anxious, or depressed. How is this diagnosed? This condition may be diagnosed based on:  Your symptoms and medical history. Your health care provider may ask about: ? Your sleep habits. ? Any medical conditions you have. ? Your mental health.  A physical exam. How is this treated? Treatment for insomnia depends on the cause. Treatment may focus on treating an underlying condition that is causing insomnia. Treatment may also include:  Medicines to help you sleep.  Counseling or therapy.  Lifestyle adjustments to help you sleep better. Follow these instructions at home: Eating and drinking   Limit or avoid  alcohol, caffeinated beverages, and cigarettes, especially close to bedtime. These can disrupt your sleep.  Do not eat a large meal or eat spicy foods right before bedtime. This can lead to digestive discomfort that can make it hard for you to sleep. Sleep habits   Keep a sleep diary to help you and your health care provider figure out what could be causing your insomnia. Write down: ? When you sleep. ? When you wake up during the night. ? How well you sleep. ? How rested you feel the next day. ? Any side effects of medicines you are taking. ? What you eat and drink.  Make your bedroom a dark, comfortable place where it is easy to fall asleep. ? Put up shades or blackout curtains to block light from outside. ? Use a white noise machine to block noise. ? Keep the temperature cool.  Limit screen use before bedtime. This includes: ? Watching TV. ? Using your smartphone, tablet, or computer.  Stick to a routine that includes going to bed and waking up at the same times every day and night. This can help  you fall asleep faster. Consider making a quiet activity, such as reading, part of your nighttime routine.  Try to avoid taking naps during the day so that you sleep better at night.  Get out of bed if you are still awake after 15 minutes of trying to sleep. Keep the lights down, but try reading or doing a quiet activity. When you feel sleepy, go back to bed. General instructions  Take over-the-counter and prescription medicines only as told by your health care provider.  Exercise regularly, as told by your health care provider. Avoid exercise starting several hours before bedtime.  Use relaxation techniques to manage stress. Ask your health care provider to suggest some techniques that may work well for you. These may include: ? Breathing exercises. ? Routines to release muscle tension. ? Visualizing peaceful scenes.  Make sure that you drive carefully. Avoid driving if you feel  very sleepy.  Keep all follow-up visits as told by your health care provider. This is important. Contact a health care provider if:  You are tired throughout the day.  You have trouble in your daily routine due to sleepiness.  You continue to have sleep problems, or your sleep problems get worse. Get help right away if:  You have serious thoughts about hurting yourself or someone else. If you ever feel like you may hurt yourself or others, or have thoughts about taking your own life, get help right away. You can go to your nearest emergency department or call:  Your local emergency services (911 in the [place]  A suicide crisis helpline, such as the [facility] at 979 571 0579. This is open 24 hours a day. Summary  Insomnia is a sleep disorder that makes it difficult to fall asleep or stay asleep.  Insomnia can be long-term (chronic) or short-term (acute).  Treatment for insomnia depends on the cause. Treatment may focus on treating an underlying condition that is causing insomnia.  Keep a sleep diary to help you and your health care provider figure out what could be causing your insomnia. This information is not intended to replace advice given to you by your health care provider. Make sure you discuss any questions you have with your health care provider. Document Released: 10/26/2000 Document Revised: 10/11/2017 Document Reviewed: 08/08/2017 Elsevier Patient Education  2020 [employer].

## 2019-07-27 ENCOUNTER — Telehealth: Payer: Self-pay

## 2019-07-27 NOTE — Telephone Encounter (Signed)
I have left patient a v/m to call the office Dr.Sanders wanted to know how often she is using her albuterol inhaler. YRL,RMA

## 2019-08-01 ENCOUNTER — Encounter: Payer: Self-pay | Admitting: Neurology

## 2019-08-13 ENCOUNTER — Other Ambulatory Visit: Payer: Self-pay | Admitting: Internal Medicine

## 2019-08-19 ENCOUNTER — Encounter: Payer: Self-pay | Admitting: Internal Medicine

## 2019-08-19 ENCOUNTER — Other Ambulatory Visit: Payer: Self-pay

## 2019-08-19 ENCOUNTER — Ambulatory Visit: Payer: BC Managed Care – PPO | Admitting: Internal Medicine

## 2019-08-19 VITALS — BP 140/82 | HR 93 | Temp 98.9°F | Ht 64.0 in | Wt 222.0 lb

## 2019-08-19 DIAGNOSIS — Z6838 Body mass index (BMI) 38.0-38.9, adult: Secondary | ICD-10-CM

## 2019-08-19 DIAGNOSIS — I10 Essential (primary) hypertension: Secondary | ICD-10-CM

## 2019-08-19 DIAGNOSIS — Z23 Encounter for immunization: Secondary | ICD-10-CM | POA: Diagnosis not present

## 2019-08-19 NOTE — Patient Instructions (Signed)
Preventing Influenza, Adult Influenza, more commonly known as "the flu," is a viral infection that mainly affects the respiratory tract. The respiratory tract includes structures that help you breathe, such as the lungs, nose, and throat. The flu causes many common cold symptoms, as well as a high fever and body aches. The flu spreads easily from person to person (is contagious). The flu is most common from December through March. This is called flu season.You can catch the flu virus by:  Breathing in droplets from an infected person's cough or sneeze.  Touching something that was recently contaminated with the virus and then touching your mouth, nose, or eyes. What can I do to lower my risk?        You can decrease your risk of getting the flu by:  Getting a flu shot (influenza vaccination) every year. This is the best way to prevent the flu. A flu shot is recommended for everyone age 6 months and older. ? It is best to get a flu shot in the fall, as soon as it is available. Getting a flu shot during winter or spring instead is still a good idea. Flu season can last into early spring. ? Preventing the flu through vaccination requires getting a new flu shot every year. This is because the flu virus changes slightly (mutates) from one year to the next. Even if a flu shot does not completely protect you from all flu virus mutations, it can reduce the severity of your illness and prevent dangerous complications of the flu. ? If you are pregnant, you can and should get a flu shot. ? If you have had a reaction to the shot in the past or if you are allergic to eggs, check with your health care provider before getting a flu shot. ? Sometimes the vaccine is available as a nasal spray. In some years, the nasal spray has not been as effective against the flu virus. Check with your health care provider if you have questions about this.  Practicing good health habits. This is especially important during  flu season. ? Avoid contact with people who are sick with flu or cold symptoms. ? Wash your hands with soap and water often. If soap and water are not available, use alcohol-based hand sanitizer. ? Avoid touching your hands to your face, especially when you have not washed your hands recently. ? Use a disinfectant to clean surfaces at home and at work that may be contaminated with the flu virus. ? Keep your body's disease-fighting system (immune system) in good shape by eating a healthy diet, drinking plenty of fluids, getting enough sleep, and exercising regularly. If you do get the flu, avoid spreading it to others by:  Staying home until your symptoms have been gone for at least one day.  Covering your mouth and nose when you cough or sneeze.  Avoiding close contact with others, especially babies and elderly people. Why are these changes important? Getting a flu shot and practicing good health habits protects you as well as other people. If you get the flu, your friends, family, and co-workers are also at risk of getting it, because it spreads so easily to others. Each year, about 2 out of every 10 people get the flu. Having the flu can lead to complications, such as pneumonia, ear infection, and sinus infection. The flu also can be deadly, especially for babies, people older than age 65, and people who have serious long-term diseases. How is this treated? Most   people recover from the flu by resting at home and drinking plenty of fluids. However, a prescription antiviral medicine may reduce your flu symptoms and may make your flu go away sooner. This medicine must be started within a few days of getting flu symptoms. You can talk with your health care provider about whether you need an antiviral medicine. Antiviral medicine may be prescribed for people who are at risk for more serious flu symptoms. This includes people who:  Are older than age 65.  Are pregnant.  Have a condition that  makes the flu worse or more dangerous. Where to find more information  Centers for Disease Control and Prevention: www.cdc.gov/flu/index.htm  Flu.gov: www.flu.gov/prevention-vaccination  American Academy [employer]icians: familydoctor.org/familydoctor/en/kids/vaccines/preventing-the-flu.html Contact a health care provider if:  You have influenza and you develop new symptoms.  You have: ? Chest pain. ? Diarrhea. ? A fever.  Your cough gets worse, or you produce more mucus. Summary  The best way to prevent the flu is to get a flu shot every year in the fall.  Even if you get the flu after you have received the yearly vaccine, your flu may be milder and go away sooner because of your flu shot.  If you get the flu, antiviral medicines that are started with a few days of symptoms may reduce your flu symptoms and may make your flu go away sooner.  You can also help prevent the flu by practicing good health habits. This information is not intended to replace advice given to you by your health care provider. Make sure you discuss any questions you have with your health care provider. Document Released: 11/13/2015 Document Revised: 10/11/2017 Document Reviewed: 07/07/2016 Elsevier Patient Education  2020 Elsevier Inc.  

## 2019-08-20 LAB — BMP8+EGFR
BUN/Creatinine Ratio: 12 (ref 9–23)
BUN: 11 mg/dL (ref 6–24)
CO2: 24 mmol/L (ref 20–29)
Calcium: 9.8 mg/dL (ref 8.7–10.2)
Chloride: 105 mmol/L (ref 96–106)
Creatinine, Ser: 0.92 mg/dL (ref 0.57–1.00)
GFR calc Af Amer: 85 mL/min/{1.73_m2} (ref >59)
GFR calc non Af Amer: 73 mL/min/{1.73_m2} (ref >59)
Glucose: 71 mg/dL (ref 65–99)
Potassium: 3.9 mmol/L (ref 3.5–5.2)
Sodium: 143 mmol/L (ref 134–144)

## 2019-08-24 NOTE — Progress Notes (Signed)
Subjective:     Patient ID: Desiree Dunn , female    DOB: 1971-01-15 , 48 y.o.   MRN: [id number]   Chief Complaint  Patient presents with  . Hypertension    HPI  She is here today for bp f/u.    Hypertension This is a chronic problem. The current episode started more than 1 year ago. The problem has been gradually worsening since onset. The problem is uncontrolled. Associated symptoms include headaches. Pertinent negatives include no blurred vision, chest pain, palpitations or shortness of breath. Risk factors for coronary artery disease include obesity and sedentary lifestyle. Past treatments include ACE inhibitors. The current treatment provides moderate improvement. Compliance problems include exercise.      Past Medical History:  Diagnosis Date  . Allergy   . Back pain   . Chronic RLQ pain    since 1995 when she had ectopic preg  . Constipation   . Deviated septum   . Diverticulitis   . Ectopic pregnancy 1995    x 1 , R side   . GERD (gastroesophageal reflux disease)   . HTN (hypertension) 05/24/2015  . Migraine without aura and without status migrainosus, not intractable   . Miscarriage    x 1  . Obesity, unspecified 07/06/2010  . Pelvic congestion    h/o   . Sleep apnea    uses cpap     Family History  Problem Relation Age of Onset  . Colon cancer Mother 25  . Colon cancer Maternal Aunt 49  . Stroke Father   . Diabetes Father   . Hypertension Father   . Hyperlipidemia Father   . Heart disease Father   . Obesity Father   . Hypertension Sister   . Breast cancer Other        GM  . Colon polyps Brother   . Colitis Brother   . Heart attack Neg Hx      Current Outpatient Medications:  .  acetaminophen (TYLENOL) 500 MG tablet, Take 1,000 mg by mouth as needed for mild pain or headache., Disp: , Rfl:  .  albuterol (VENTOLIN HFA) 108 (90 Base) MCG/ACT inhaler, Inhale 2 puffs into the lungs every 6 (six) hours as needed for wheezing or shortness of  breath., Disp: 1 Inhaler, Rfl: 2 .  fexofenadine (ALLEGRA) 180 MG tablet, TAKE 1 TABLET BY MOUTH EVERY DAY, Disp: 30 tablet, Rfl: 5 .  fluconazole (DIFLUCAN) 150 MG tablet, pls take one tab po today, repeat in 2 days, Disp: 2 tablet, Rfl: 0 .  fluticasone (FLONASE) 50 MCG/ACT nasal spray, SPRAY 2 SPRAYS INTO EACH NOSTRIL EVERY DAY, Disp: , Rfl:  .  ibuprofen (ADVIL,MOTRIN) 200 MG tablet, Take 400 mg by mouth as needed for moderate pain., Disp: , Rfl:  .  levocetirizine (XYZAL) 5 MG tablet, Take 1 tablet (5 mg total) by mouth every evening., Disp: 90 tablet, Rfl: 1 .  mometasone (NASONEX) 50 MCG/ACT nasal spray, Place 2 sprays into the nose daily., Disp: 17 g, Rfl: 2 .  NOREL AD 4-10-325 MG TABS, Take 1 tablet by mouth 2 (two) times daily as needed. (Patient not taking: Reported on 06/25/2019), Disp: 20 tablet, Rfl: 1 .  olmesartan (BENICAR) 20 MG tablet, TAKE 1 TABLET BY MOUTH EVERY DAY, Disp: 30 tablet, Rfl: 1 .  predniSONE (STERAPRED UNI-PAK 21 TAB) 10 MG (21) TBPK tablet, Take as directed, Disp: 21 tablet, Rfl: 0 .  temazepam (RESTORIL) 15 MG capsule, Take 1 capsule (15 mg total) by mouth  at bedtime as needed for sleep., Disp: 30 capsule, Rfl: 0 .  verapamil (CALAN-SR) 120 MG CR tablet, TAKE 1 TABLET BY MOUTH EVERY DAY, Disp: 90 tablet, Rfl: 0   Allergies  Allergen Reactions  . Amoxicillin Hives    Hives     Review of Systems  Constitutional: Negative.   Eyes: Negative for blurred vision.  Respiratory: Negative.  Negative for shortness of breath.   Cardiovascular: Negative.  Negative for chest pain and palpitations.  Gastrointestinal: Negative.   Neurological: Positive for headaches.  Psychiatric/Behavioral: Negative.      Today's Vitals   08/19/19 1625  BP: 140/82  Pulse: 93  Temp: 98.9 F (37.2 C)  TempSrc: Oral  SpO2: 97%  Weight: 222 lb (100.7 kg)  Height: _0  (1.626 m)   Body mass index is 38.11 kg/m.   Objective:  Physical Exam Vitals signs and nursing note  reviewed.  Constitutional:      Appearance: Normal appearance.  HENT:     Head: Normocephalic and atraumatic.  Cardiovascular:     Rate and Rhythm: Normal rate and regular rhythm.     Heart sounds: Normal heart sounds.  Pulmonary:     Effort: Pulmonary effort is normal.     Breath sounds: Normal breath sounds.  Skin:    General: Skin is warm.  Neurological:     General: No focal deficit present.     Mental Status: She is alert.  Psychiatric:        Mood and Affect: Mood normal.        Behavior: Behavior normal.         Assessment And Plan:     1. Essential hypertension  Chronic, uncontrolled.  I will increase her dose of olmesartan to 61m daily. She will rto in six weeks for re-evaluation. She is encouraged to avoid adding salt to her foods and to incorporate more exercise into her daily routine.   - BMP8+EGFR  2. Need for influenza vaccination  - Flu Vaccine QUAD 6+ mos PF IM (Fluarix Quad PF)  3. Class 2 severe obesity due to excess calories with serious comorbidity and body mass index (BMI) of 38.0 to 38.9 in adult ([facility]  She is encouraged to strive for BMI less than 30 to decrease cardiac risk. Importance of achieving optimal weight to decrease risk of cardiovascular disease and cancers was discussed with the patient in full detail. She is encouraged to start slowly - start with 10 minutes twice daily at least three to four days per week and to gradually build to 30 minutes five days weekly. She was given tips to incorporate more activity into her daily routine - take stairs when possible, park farther away from her job, grocery stores, etc.    RMaximino Greenland MD    THE PATIENT IS ENCOURAGED TO PRACTICE SOCIAL DISTANCING DUE TO THE COVID-19 PANDEMIC.

## 2019-08-28 ENCOUNTER — Ambulatory Visit: Payer: BC Managed Care – PPO | Admitting: Allergy

## 2019-08-31 ENCOUNTER — Telehealth: Payer: Self-pay

## 2019-08-31 DIAGNOSIS — G4733 Obstructive sleep apnea (adult) (pediatric): Secondary | ICD-10-CM | POA: Diagnosis not present

## 2019-08-31 NOTE — Telephone Encounter (Signed)
LVM FOR PT TO CALL TO VERIFY THAT HER LAST PCP WAS [facility] WHICH SENT [place] RECORD BACK THAT SHE WAS NEVER SEEN THERE,

## 2019-09-05 ENCOUNTER — Encounter: Payer: Self-pay | Admitting: Internal Medicine

## 2019-09-05 ENCOUNTER — Other Ambulatory Visit: Payer: Self-pay | Admitting: Neurology

## 2019-09-05 ENCOUNTER — Other Ambulatory Visit: Payer: Self-pay

## 2019-09-05 MED ORDER — OLMESARTAN MEDOXOMIL 20 MG PO TABS: 20.0000 mg | ORAL_TABLET | Freq: Every day | ORAL | 1 refills | Status: DC

## 2019-09-05 MED ORDER — OLMESARTAN MEDOXOMIL 40 MG PO TABS: 20.0000 mg | ORAL_TABLET | Freq: Every day | ORAL | 1 refills | Status: DC

## 2019-09-18 ENCOUNTER — Ambulatory Visit: Payer: BC Managed Care – PPO | Admitting: Allergy

## 2019-09-18 ENCOUNTER — Encounter: Payer: Self-pay | Admitting: Allergy

## 2019-09-18 ENCOUNTER — Other Ambulatory Visit: Payer: Self-pay

## 2019-09-18 VITALS — BP 142/96 | HR 90 | Temp 98.2°F | Resp 16 | Ht 65.0 in | Wt 222.4 lb

## 2019-09-18 DIAGNOSIS — H1013 Acute atopic conjunctivitis, bilateral: Secondary | ICD-10-CM

## 2019-09-18 DIAGNOSIS — J45990 Exercise induced bronchospasm: Secondary | ICD-10-CM

## 2019-09-18 DIAGNOSIS — J3089 Other allergic rhinitis: Secondary | ICD-10-CM | POA: Diagnosis not present

## 2019-09-18 MED ORDER — PAZEO 0.7 % OP SOLN: 1.0000 [drp] | OPHTHALMIC | 5 refills | Status: DC

## 2019-09-18 MED ORDER — LEVOCETIRIZINE DIHYDROCHLORIDE 5 MG PO TABS: 5.0000 mg | ORAL_TABLET | Freq: Every evening | ORAL | 5 refills | Status: DC

## 2019-09-18 MED ORDER — AZELASTINE-FLUTICASONE 137-50 MCG/ACT NA SUSP: 1.0000 | Freq: Two times a day (BID) | NASAL | 5 refills | Status: DC

## 2019-09-18 MED ORDER — ALBUTEROL SULFATE HFA 108 (90 BASE) MCG/ACT IN AERS: 2.0000 | INHALATION_SPRAY | RESPIRATORY_TRACT | 1 refills | Status: DC | PRN

## 2019-09-18 MED ORDER — MONTELUKAST SODIUM 10 MG PO TABS: 10.0000 mg | ORAL_TABLET | Freq: Every day | ORAL | 5 refills | Status: DC

## 2019-09-18 MED ORDER — EPINEPHRINE 0.3 MG/0.3ML IJ SOAJ: 0.3000 mg | INTRAMUSCULAR | 2 refills | Status: DC | PRN

## 2019-09-18 NOTE — Patient Instructions (Addendum)
Allergies  - environmental allergy skin testing today is positive to tree pollens, weed, pollens, grass pollens, molds, dust mite, dog and cockroach  - allergen avoidance measures discussed/handouts provided  - continue Xyzal 5mg  daily (if needed may take twice a day)  - start Singulair 10mg  daily at bedtime.  This is a leukotriene blocker and works in conjunction with antihistamine  - for nasal drainage start nasal antihistamine, Astelin 2 sprays each nostril twice a day  - for nasal congestion can continue use of Flonase 2 sprays each nostril daily  - use nasal spray with appropriate nasal spray technique (point tip of bottle towards the ear on same side nostril) Dymista.  - recommend use of nasal saline rinse to help flush and clean the nose.  Use prior to medicated nasal spray use.  Use only distilled water or boil water and bring to room temperature prior to use.   - for itchy/watery/red eyes use Pazeo 1 drop each eye daily as needed  - allergen immunotherapy discussed today including protocol, benefits and risk.  Informational handout provided.  If interested in this therapuetic option you can check with your insurance carrier for coverage and schedule a start injection visit for the injection room.  Will prescribe an Epinephrine device have on hand on days of injections.   Exercise induced asthma  - have access to albuterol inhaler 2 puffs every 4-6 hours as needed for cough/wheeze/shortness of breath/chest tightness.  May use 15-20 minutes prior to activity.   Monitor frequency of use.    Follow-up 4-6 months or sooner if needed

## 2019-09-18 NOTE — Progress Notes (Signed)
New Patient Note  RE: Desiree Dunn MRN: [id number] DOB: Oct 14, 1971 Date of Office Visit: 09/18/2019  Referring provider: Glendale Chard, MD Primary care provider: Glendale Chard, MD  Chief Complaint: allergies  History of present illness: Desiree Dunn is a 48 y.o. female presenting today for consultation for allergies.    She reports symptoms of watery/itchy eyes, throat clearing, nasal drainage, pressure around the eyes, sneezing, ear fullness and itching.  Wakes up with hoarse voice.   Fall is worse for her but symptoms can be year-round.  She reports getting sinus infections 2-3 times a year treated with antibiotics.   She has tried zyrtec, claritin, allegra and xyzal and nothing has helped control her allergy symptoms.  But does feel that Xyzal works the best and claritin and Human resources officer the least.  Using flonase in AM and Azelastine in PM.    She does use albuterol prior to exercise.  Denies needing albuterol any other times.  Denies nighttime awakenings.  Denies need for systemic steroids.    She has history of eczema when she was younger.  No longer an issue.  She has history of psoriasis in scalp.   No history of food allergy.     Review of systems: Review of Systems  Constitutional: Negative for chills, fever and malaise/fatigue.  HENT: Positive for congestion. Negative for ear discharge, ear pain, nosebleeds and sore throat.   Eyes: Negative for pain, discharge and redness.  Respiratory: Negative.   Cardiovascular: Negative.   Gastrointestinal: Negative.   Musculoskeletal: Negative.   Skin: Negative for itching and rash.  Neurological: Negative.     All other systems negative unless noted above in HPI  Past medical history: Past Medical History:  Diagnosis Date  . Allergy   . Back pain   . Chronic RLQ pain    since 1995 when she had ectopic preg  . Constipation   . Deviated septum   . Diverticulitis   . Ectopic pregnancy 1995    x 1 , R  side   . GERD (gastroesophageal reflux disease)   . HTN (hypertension) 05/24/2015  . Migraine without aura and without status migrainosus, not intractable   . Miscarriage    x 1  . Obesity, unspecified 07/06/2010  . Pelvic congestion    h/o   . Sleep apnea    uses cpap    Past surgical history: Past Surgical History:  Procedure Laterality Date  . ABDOMINAL HYSTERECTOMY  12/2009   [facility]  . BREAST REDUCTION SURGERY  12/09  . COLONOSCOPY  2001   X3 ; diverticulosis  . FOOT SURGERY    . NASAL SEPTOPLASTY W/ TURBINOPLASTY Bilateral 10/31/2018   Procedure: NASAL SEPTOPLASTY WITH TURBINATE REDUCTION;  Surgeon: Jerrell Belfast, MD;  Location: [facility];  Service: ENT;  Laterality: Bilateral;  . PARTIAL HYSTERECTOMY    . REDUCTION MAMMAPLASTY Bilateral   . RIGHT OOPHORECTOMY  12/2009   [place]  . TUBAL LIGATION  2000    Family history:  Family History  Problem Relation Age of Onset  . Colon cancer Mother 84  . Colon cancer Maternal Aunt 13  . Stroke Father   . Diabetes Father   . Hypertension Father   . Hyperlipidemia Father   . Heart disease Father   . Obesity Father   . Hypertension Sister   . Breast cancer Other        GM  . Colon polyps Brother   . Colitis Brother   . Heart attack Neg  Hx     Social history: Lives in a home with carpeting with gas heating and central cooling.  Dogs inside/outside the home.  No concern for water damage, mildew or roaches in the home.  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  No smoking history.    Medication List: Current Outpatient Medications  Medication Sig Dispense Refill  . acetaminophen (TYLENOL) 500 MG tablet Take 1,000 mg by mouth as needed for mild pain or headache.    . albuterol (VENTOLIN HFA) 108 (90 Base) MCG/ACT inhaler Inhale 2 puffs into the lungs every 6 (six) hours as needed for wheezing or shortness of breath. 1 Inhaler 2  . fexofenadine (ALLEGRA) 180 MG tablet TAKE 1 TABLET BY MOUTH EVERY DAY 30 tablet 5  . fluticasone  (FLONASE) 50 MCG/ACT nasal spray SPRAY 2 SPRAYS INTO EACH NOSTRIL EVERY DAY    . ibuprofen (ADVIL,MOTRIN) 200 MG tablet Take 400 mg by mouth as needed for moderate pain.    . Multiple Vitamins-Minerals (MULTIVITAMIN GUMMIES WOMENS PO) Take by mouth.    . olmesartan (BENICAR) 40 MG tablet Take 0.5 tablets (20 mg total) by mouth daily. 90 tablet 1  . Probiotic Product (PROBIOTIC DAILY PO) Take by mouth.    . verapamil (CALAN-SR) 120 MG CR tablet TAKE 1 TABLET BY MOUTH EVERY DAY 90 tablet 0  . azelastine (ASTELIN) 0.1 % nasal spray INHALE 1 SPRAY BY NASAL ROUTE 2 TIMES DAILY. AS DIRECTED    . levocetirizine (XYZAL) 5 MG tablet Take 1 tablet (5 mg total) by mouth every evening. (Patient not taking: Reported on 09/18/2019) 90 tablet 1   No current facility-administered medications for this visit.     Known medication allergies: Allergies  Allergen Reactions  . Amoxicillin Hives    Hives     Physical examination: Blood pressure (!) 142/96, pulse 90, temperature 98.2 F (36.8 C), temperature source Temporal, resp. rate 16, height 5\' 5"  (1.651 m), weight 222 lb 6.4 oz (100.9 kg), SpO2 96 %.  General: Alert, interactive, in no acute distress. HEENT: PERRLA, TMs pearly gray, turbinates moderately edematous with clear discharge, post-pharynx non erythematous. Neck: Supple without lymphadenopathy. Lungs: Clear to auscultation without wheezing, rhonchi or rales. {no increased work of breathing. CV: Normal S1, S2 without murmurs. Abdomen: Nondistended, nontender. Skin: Warm and dry, without lesions or rashes. Extremities:  No clubbing, cyanosis or edema. Neuro:   Grossly intact.  Diagnositics/Labs:  Allergy testing: environmental allergy skin prick testing is positive to [place], dust mites, cockroach. Intradermal testing is positive to grass mix, ragweed mix, weed mix, tree mix, mold mix 1, mold mix 4, dog. Allergy testing results were read and interpreted by provider, documented by  clinical staff.   Assessment and plan: Allergic rhinitis with conjunctivitis  - environmental allergy skin testing today is positive to tree pollens, weed, pollens, grass pollens, molds, dust mite, dog and cockroach  - allergen avoidance measures discussed/handouts provided  - continue Xyzal 5mg  daily (if needed may take twice a day)  - start Singulair 10mg  daily at bedtime.  This is a leukotriene blocker and works in conjunction with antihistamine  - for nasal drainage start nasal antihistamine, Astelin 2 sprays each nostril twice a day  - for nasal congestion can continue use of Flonase 2 sprays each nostril daily  - use nasal spray with appropriate nasal spray technique (point tip of bottle towards the ear on same side nostril) Dymista.  - recommend use of nasal saline rinse to help flush and clean  the nose.  Use prior to medicated nasal spray use.  Use only distilled water or boil water and bring to room temperature prior to use.   - for itchy/watery/red eyes use Pazeo 1 drop each eye daily as needed  - allergen immunotherapy discussed today including protocol, benefits and risk.  Informational handout provided.  If interested in this therapuetic option you can check with your insurance carrier for coverage and schedule a start injection visit for the injection room.  Will prescribe an Epinephrine device have on hand on days of injections.   Exercise induced asthma  - have access to albuterol inhaler 2 puffs every 4-6 hours as needed for cough/wheeze/shortness of breath/chest tightness.  May use 15-20 minutes prior to activity.   Monitor frequency of use.    Follow-up 4-6 months or sooner if needed   I appreciate the opportunity to take part in Romeka's care. Please do not hesitate to contact me with questions.  Sincerely,   Prudy Feeler, MD Allergy/Immunology [facility] [place]

## 2019-09-21 ENCOUNTER — Encounter: Payer: Self-pay | Admitting: Internal Medicine

## 2019-09-21 ENCOUNTER — Other Ambulatory Visit: Payer: Self-pay

## 2019-09-21 MED ORDER — OLMESARTAN MEDOXOMIL 40 MG PO TABS: 20.0000 mg | ORAL_TABLET | Freq: Every day | ORAL | 1 refills | Status: DC

## 2019-09-22 ENCOUNTER — Other Ambulatory Visit: Payer: Self-pay

## 2019-09-22 MED ORDER — OLMESARTAN MEDOXOMIL 40 MG PO TABS: 40.0000 mg | ORAL_TABLET | Freq: Every day | ORAL | 1 refills | Status: DC

## 2019-09-22 NOTE — Addendum Note (Signed)
Addended by: Theresia Lo on: 09/22/2019 12:06 PM   Modules accepted: Orders

## 2019-09-23 DIAGNOSIS — J301 Allergic rhinitis due to pollen: Secondary | ICD-10-CM | POA: Diagnosis not present

## 2019-09-23 NOTE — Progress Notes (Signed)
VIALS EXP 09-22-20

## 2019-09-24 DIAGNOSIS — J3089 Other allergic rhinitis: Secondary | ICD-10-CM | POA: Diagnosis not present

## 2019-09-30 ENCOUNTER — Ambulatory Visit: Payer: BC Managed Care – PPO | Admitting: Internal Medicine

## 2019-10-02 ENCOUNTER — Ambulatory Visit (INDEPENDENT_AMBULATORY_CARE_PROVIDER_SITE_OTHER): Payer: BC Managed Care – PPO

## 2019-10-02 ENCOUNTER — Other Ambulatory Visit: Payer: Self-pay

## 2019-10-02 DIAGNOSIS — J309 Allergic rhinitis, unspecified: Secondary | ICD-10-CM | POA: Diagnosis not present

## 2019-10-02 NOTE — Progress Notes (Signed)
Immunotherapy   Patient Details  Name: Desiree Dunn MRN: [id number] Date of Birth: [birth year]  10/02/2019  [name] started allergy injections today. Patient received 0.05 of both her blue vials. One with Pollen-Pets and the other with Mold-Dmite-CR. Patient waited in an exam room for 30 minutes with no problems. Following schedule: B  Frequency: once weekly Epi-Pen: Yes Consent signed and patient instructions given.   Herbie Drape 10/02/2019, 10:09 AM

## 2019-10-06 ENCOUNTER — Ambulatory Visit (INDEPENDENT_AMBULATORY_CARE_PROVIDER_SITE_OTHER): Payer: BC Managed Care – PPO

## 2019-10-06 DIAGNOSIS — J309 Allergic rhinitis, unspecified: Secondary | ICD-10-CM | POA: Diagnosis not present

## 2019-10-16 ENCOUNTER — Ambulatory Visit (INDEPENDENT_AMBULATORY_CARE_PROVIDER_SITE_OTHER): Payer: BC Managed Care – PPO | Admitting: *Deleted

## 2019-10-16 DIAGNOSIS — J309 Allergic rhinitis, unspecified: Secondary | ICD-10-CM

## 2019-10-26 ENCOUNTER — Ambulatory Visit: Payer: BC Managed Care – PPO | Admitting: Internal Medicine

## 2019-10-26 ENCOUNTER — Ambulatory Visit (INDEPENDENT_AMBULATORY_CARE_PROVIDER_SITE_OTHER): Payer: BC Managed Care – PPO

## 2019-10-26 ENCOUNTER — Other Ambulatory Visit: Payer: Self-pay

## 2019-10-26 ENCOUNTER — Encounter: Payer: Self-pay | Admitting: Internal Medicine

## 2019-10-26 VITALS — BP 126/84 | HR 81 | Temp 98.9°F | Ht 65.0 in | Wt 229.8 lb

## 2019-10-26 DIAGNOSIS — Z6838 Body mass index (BMI) 38.0-38.9, adult: Secondary | ICD-10-CM

## 2019-10-26 DIAGNOSIS — R635 Abnormal weight gain: Secondary | ICD-10-CM

## 2019-10-26 DIAGNOSIS — J309 Allergic rhinitis, unspecified: Secondary | ICD-10-CM | POA: Diagnosis not present

## 2019-10-26 DIAGNOSIS — I10 Essential (primary) hypertension: Secondary | ICD-10-CM

## 2019-10-26 LAB — BMP8+EGFR
BUN/Creatinine Ratio: 12 (ref 9–23)
BUN: 11 mg/dL (ref 6–24)
CO2: 21 mmol/L (ref 20–29)
Calcium: 9.8 mg/dL (ref 8.7–10.2)
Chloride: 104 mmol/L (ref 96–106)
Creatinine, Ser: 0.94 mg/dL (ref 0.57–1.00)
GFR calc Af Amer: 82 mL/min/{1.73_m2} (ref >59)
GFR calc non Af Amer: 71 mL/min/{1.73_m2} (ref >59)
Glucose: 100 mg/dL — ABNORMAL HIGH (ref 65–99)
Potassium: 4.4 mmol/L (ref 3.5–5.2)
Sodium: 138 mmol/L (ref 134–144)

## 2019-10-26 MED ORDER — VERAPAMIL HCL ER 120 MG PO TBCR: 120.0000 mg | EXTENDED_RELEASE_TABLET | Freq: Every day | ORAL | 1 refills | Status: DC

## 2019-10-26 NOTE — Patient Instructions (Signed)

## 2019-10-26 NOTE — Progress Notes (Signed)
This visit occurred during the SARS-CoV-2 public health emergency.  Safety protocols were in place, including screening questions prior to the visit, additional usage of staff PPE, and extensive cleaning of exam room while observing appropriate contact time as indicated for disinfecting solutions.  Subjective:     Patient ID: Desiree Dunn , female    DOB: [birth year] , 47 y.o.   MRN: [id number]   Chief Complaint  Patient presents with  . Hypertension    HPI  She is here today for bp check. She reports compliance with Olmesartan. She report higher dose initially caused her to have some dizziness, but her sx have since resolved. She admits that she is not exercising as much as she should.   Hypertension This is a chronic problem. The current episode started more than 1 year ago. The problem has been gradually improving since onset. The problem is controlled. Pertinent negatives include no blurred vision, chest pain, palpitations or shortness of breath. Risk factors for coronary artery disease include obesity and sedentary lifestyle. Past treatments include angiotensin blockers and calcium channel blockers. The current treatment provides moderate improvement.     Past Medical History:  Diagnosis Date  . Allergy   . Back pain   . Chronic RLQ pain    since 1995 when she had ectopic preg  . Constipation   . Deviated septum   . Diverticulitis   . Ectopic pregnancy 1995    x 1 , R side   . GERD (gastroesophageal reflux disease)   . HTN (hypertension) 05/24/2015  . Migraine without aura and without status migrainosus, not intractable   . Miscarriage    x 1  . Obesity, unspecified 07/06/2010  . Pelvic congestion    h/o   . Sleep apnea    uses cpap     Family History  Problem Relation Age of Onset  . Colon cancer Mother 37  . Colon cancer Maternal Aunt 8  . Stroke Father   . Diabetes Father   . Hypertension Father   . Hyperlipidemia Father   . Heart disease Father   .  Obesity Father   . Hypertension Sister   . Breast cancer Other        GM  . Colon polyps Brother   . Colitis Brother   . Heart attack Neg Hx      Current Outpatient Medications:  .  acetaminophen (TYLENOL) 500 MG tablet, Take 1,000 mg by mouth as needed for mild pain or headache., Disp: , Rfl:  .  albuterol (VENTOLIN HFA) 108 (90 Base) MCG/ACT inhaler, Inhale 2 puffs into the lungs every 6 (six) hours as needed for wheezing or shortness of breath., Disp: 1 Inhaler, Rfl: 2 .  albuterol (VENTOLIN HFA) 108 (90 Base) MCG/ACT inhaler, Inhale 2 puffs into the lungs every 4 (four) hours as needed., Disp: 18 g, Rfl: 1 .  Azelastine-Fluticasone 137-50 MCG/ACT SUSP, Place 1 spray into the nose 2 (two) times daily., Disp: 23 g, Rfl: 5 .  EPINEPHrine (AUVI-Q) 0.3 mg/0.3 mL IJ SOAJ injection, Inject 0.3 mLs (0.3 mg total) into the muscle as needed., Disp: 4 each, Rfl: 2 .  ibuprofen (ADVIL,MOTRIN) 200 MG tablet, Take 400 mg by mouth as needed for moderate pain., Disp: , Rfl:  .  levocetirizine (XYZAL) 5 MG tablet, Take 1 tablet (5 mg total) by mouth every evening., Disp: 30 tablet, Rfl: 5 .  montelukast (SINGULAIR) 10 MG tablet, Take 1 tablet (10 mg total) by mouth at bedtime., Disp:  30 tablet, Rfl: 5 .  Multiple Vitamins-Minerals (MULTIVITAMIN GUMMIES WOMENS PO), Take by mouth., Disp: , Rfl:  .  olmesartan (BENICAR) 40 MG tablet, Take 1 tablet (40 mg total) by mouth daily., Disp: 90 tablet, Rfl: 1 .  Olopatadine HCl (PAZEO) 0.7 % SOLN, Place 1 drop into both eyes 1 day or 1 dose., Disp: 2.5 mL, Rfl: 5 .  Probiotic Product (PROBIOTIC DAILY PO), Take by mouth., Disp: , Rfl:  .  verapamil (CALAN-SR) 120 MG CR tablet, Take 1 tablet (120 mg total) by mouth daily., Disp: 90 tablet, Rfl: 1   Allergies  Allergen Reactions  . Amoxicillin Hives    Hives     Review of Systems  Constitutional: Negative.   Eyes: Negative for blurred vision.  Respiratory: Negative.  Negative for shortness of breath.    Cardiovascular: Negative.  Negative for chest pain and palpitations.  Gastrointestinal: Negative.   Neurological: Negative.   Psychiatric/Behavioral: Negative.      Today's Vitals   10/26/19 0929  BP: 126/84  Pulse: 81  Temp: 98.9 F (37.2 C)  TempSrc: Oral  Weight: 229 lb 12.8 oz (104.2 kg)  Height: _0  (1.651 m)   Body mass index is 38.24 kg/m.   Objective:  Physical Exam Vitals and nursing note reviewed.  Constitutional:      Appearance: Normal appearance.  HENT:     Head: Normocephalic and atraumatic.  Cardiovascular:     Rate and Rhythm: Normal rate and regular rhythm.     Heart sounds: Normal heart sounds.  Pulmonary:     Effort: Pulmonary effort is normal.     Breath sounds: Normal breath sounds.  Skin:    General: Skin is warm.  Neurological:     General: No focal deficit present.     Mental Status: She is alert.  Psychiatric:        Mood and Affect: Mood normal.        Behavior: Behavior normal.         Assessment And Plan:     1. Essential hypertension  Chronic. Since her dose of olmesartan was increased at last visit, I will check BMP today. She is encouraged to follow a low-sodium diet. She will rto in 3 months for re-evaluation.   - BMP8+EGFR  2. Weight gain  She is aware of 9 pound weight gain since her Sept visit. She does not wish to take rx weight loss meds at this time. Will consider checking insulin level at her next visit.   3. Class 2 severe obesity due to excess calories with serious comorbidity and body mass index (BMI) of 38.0 to 38.9 in adult [facility])  She is encouraged to incorporate more exercise into her daily routine. She is to aim for at least 150 minutes per week. She is encouraged to aim to lose ten percent of her body weight to decrease cardiac risk.   [name], MD    THE PATIENT IS ENCOURAGED TO PRACTICE SOCIAL DISTANCING DUE TO THE COVID-19 PANDEMIC.

## 2019-10-31 DIAGNOSIS — G4733 Obstructive sleep apnea (adult) (pediatric): Secondary | ICD-10-CM | POA: Diagnosis not present

## 2019-11-02 ENCOUNTER — Ambulatory Visit (INDEPENDENT_AMBULATORY_CARE_PROVIDER_SITE_OTHER): Payer: BC Managed Care – PPO

## 2019-11-02 DIAGNOSIS — J309 Allergic rhinitis, unspecified: Secondary | ICD-10-CM

## 2019-11-11 ENCOUNTER — Ambulatory Visit (INDEPENDENT_AMBULATORY_CARE_PROVIDER_SITE_OTHER): Payer: BC Managed Care – PPO | Admitting: *Deleted

## 2019-11-11 DIAGNOSIS — J309 Allergic rhinitis, unspecified: Secondary | ICD-10-CM | POA: Diagnosis not present

## 2019-11-19 ENCOUNTER — Ambulatory Visit: Payer: BC Managed Care – PPO | Admitting: Podiatry

## 2019-11-19 ENCOUNTER — Ambulatory Visit: Payer: BC Managed Care – PPO

## 2019-11-19 ENCOUNTER — Encounter: Payer: Self-pay | Admitting: Podiatry

## 2019-11-19 ENCOUNTER — Other Ambulatory Visit: Payer: Self-pay

## 2019-11-19 DIAGNOSIS — M779 Enthesopathy, unspecified: Secondary | ICD-10-CM

## 2019-11-19 DIAGNOSIS — M2042 Other hammer toe(s) (acquired), left foot: Secondary | ICD-10-CM

## 2019-11-20 ENCOUNTER — Ambulatory Visit (INDEPENDENT_AMBULATORY_CARE_PROVIDER_SITE_OTHER): Payer: BC Managed Care – PPO

## 2019-11-20 DIAGNOSIS — J309 Allergic rhinitis, unspecified: Secondary | ICD-10-CM

## 2019-11-23 NOTE — Progress Notes (Signed)
Subjective:   Patient ID: Desiree Dunn, female   DOB: 49 y.o.   MRN: [id number]   HPI Patient presents stating I am having a lot of pain on the outside of his left fifth toe and also my arch is really bother me and I know I am ready for new orthotics   ROS      Objective:  Physical Exam  Neurovascular status intact with patient noted to have a keratotic lesion proximal aspect of the incision site left with inflammation fluid buildup and is also noted to have significant depression of the arch is bilateral with working on cement floors     Assessment:  Inflammatory capsulitis proximal fifth digit left with pain along with chronic foot structural issues     Plan:  H&P x-ray left reviewed and I went ahead did proximal nerve block sterile dressing applied and I then injected with 2 mg Dexasone Kenalog and debrided the lesion and instructed on wider shoes.  Patient scanned for customized orthotics to reduce all pressure on the plantar feet and will have these dispensed by ped orthotist  X-ray indicates there is some spurring noted but no other indications of pathology

## 2019-11-27 ENCOUNTER — Other Ambulatory Visit: Payer: Self-pay

## 2019-11-27 ENCOUNTER — Ambulatory Visit (INDEPENDENT_AMBULATORY_CARE_PROVIDER_SITE_OTHER): Payer: BC Managed Care – PPO | Admitting: *Deleted

## 2019-11-27 ENCOUNTER — Telehealth: Payer: Self-pay | Admitting: Family

## 2019-11-27 ENCOUNTER — Other Ambulatory Visit: Payer: BC Managed Care – PPO | Admitting: Orthotics

## 2019-11-27 DIAGNOSIS — R350 Frequency of micturition: Secondary | ICD-10-CM

## 2019-11-27 DIAGNOSIS — J309 Allergic rhinitis, unspecified: Secondary | ICD-10-CM | POA: Diagnosis not present

## 2019-11-27 DIAGNOSIS — M545 Low back pain, unspecified: Secondary | ICD-10-CM

## 2019-11-27 DIAGNOSIS — N898 Other specified noninflammatory disorders of vagina: Secondary | ICD-10-CM

## 2019-11-27 NOTE — Progress Notes (Signed)
Based on what you shared with me, I feel your condition warrants further evaluation and I recommend that you be seen for a face to face office visit.  Given you are having urinary frequency, back pain, and vaginal issues you need to be seen face to face for further testing. You could have a UTI and yeast infection.    NOTE: If you entered your credit card information for this eVisit, you will not be charged. You may see a "hold" on your card for the $35 but that hold will drop off and you will not have a charge processed.   If you are having a true medical emergency please call 911.      For an urgent face to face visit, [facility] has five urgent care centers for your convenience:      NEW:  [facility] [place] at [place] Get Driving Directions [id number] [place], [place] [address] . 10 am - 6pm Monday - Friday    [place] [facility] [place] [facility]) Get Driving Directions [id number] [address] [place], [place] [address] . 10 am to 8 pm Monday-Friday . 12 pm to 8 pm [facility] at [facility] Get Driving Directions [id number] [place], [place], [place] [address] . 8 am to 8 pm Monday-Friday . 9 am to 6 pm Saturday . 11 am to 6 pm Sunday     [facility] at [facility] Get Driving Directions  [id number] [address] [place], [place] [address] . 8 am to 8 pm Monday-Friday . 8 am to 4 pm [facility] at [place] Get Driving Directions [id number] [address] [place], [place] [address] . 12 pm to 6 pm Monday-Friday      Your e-visit answers were reviewed by a board certified advanced clinical practitioner to complete your personal care plan.  Thank you for using e-Visits.

## 2019-12-04 ENCOUNTER — Ambulatory Visit (INDEPENDENT_AMBULATORY_CARE_PROVIDER_SITE_OTHER): Payer: BC Managed Care – PPO | Admitting: *Deleted

## 2019-12-04 DIAGNOSIS — J309 Allergic rhinitis, unspecified: Secondary | ICD-10-CM

## 2019-12-10 ENCOUNTER — Encounter: Payer: Self-pay | Admitting: Adult Health

## 2019-12-10 ENCOUNTER — Other Ambulatory Visit: Payer: Self-pay

## 2019-12-10 ENCOUNTER — Ambulatory Visit: Payer: BC Managed Care – PPO | Admitting: Adult Health

## 2019-12-10 VITALS — BP 131/86 | HR 77 | Temp 97.6°F | Ht 64.0 in | Wt 234.2 lb

## 2019-12-10 DIAGNOSIS — G4733 Obstructive sleep apnea (adult) (pediatric): Secondary | ICD-10-CM

## 2019-12-10 DIAGNOSIS — Z9989 Dependence on other enabling machines and devices: Secondary | ICD-10-CM

## 2019-12-10 NOTE — Progress Notes (Signed)
PATIENT: Desiree Dunn DOB: 1971/05/02  REASON FOR VISIT: follow up HISTORY FROM: patient  HISTORY OF PRESENT ILLNESS: Today 12/10/19:  [name] is a 49 year old female with a history of obstructive sleep apnea on CPAP.  She returns today for follow-up.  Her CPAP download indicates that she has been using her machine 25 out of 30 days for compliance of 83%.  She use her machine greater than 4 hours 24 days for compliance of 80%.  On average she uses her machine 5 hours and 56 minutes.  Her residual AHI is 1.5 on 6 to 16 cm of water with EPR of 3.  Her leak in the 95th percentile is 0.6 L/min.  She states that since she has been using the CPAP more consistently she can tell that she feels more rested during the day.  She reports that she is also trying to exercise and lose weight.  She returns today for an evaluation.  HISTORY 06/04/19:  [name] is a 49 year old female with a history of obstructive sleep apnea on CPAP.  She states that in December she had her deviated septum repair.  She reports that since then she has not been using the CPAP consistently.  She states that she has a hard time wearing the full facemask.  She also notes that her husband states that she does not snore nearly as much since she had the surgery.  Her Epworth sleepiness score is 3 and fatigue severity score is 23.  She returns today for follow-up.  REVIEW OF SYSTEMS: Out of a complete 14 system review of symptoms, the patient complains only of the following symptoms, and all other reviewed systems are negative.  Epworth sleepiness scale: Fatigue severity score 36  ALLERGIES: Allergies  Allergen Reactions  . Amoxicillin Hives    Hives    HOME MEDICATIONS: Outpatient Medications Prior to Visit  Medication Sig Dispense Refill  . acetaminophen (TYLENOL) 500 MG tablet Take 1,000 mg by mouth as needed for mild pain or headache.    . albuterol (VENTOLIN HFA) 108 (90 Base)  MCG/ACT inhaler Inhale 2 puffs into the lungs every 4 (four) hours as needed. 18 g 1  . Azelastine-Fluticasone 137-50 MCG/ACT SUSP Place 1 spray into the nose 2 (two) times daily. 23 g 5  . EPINEPHrine (AUVI-Q) 0.3 mg/0.3 mL IJ SOAJ injection Inject 0.3 mLs (0.3 mg total) into the muscle as needed. 4 each 2  . ibuprofen (ADVIL,MOTRIN) 200 MG tablet Take 400 mg by mouth as needed for moderate pain.    [name] levocetirizine (XYZAL) 5 MG tablet Take 1 tablet (5 mg total) by mouth every evening. 30 tablet 5  . montelukast (SINGULAIR) 10 MG tablet Take 1 tablet (10 mg total) by mouth at bedtime. 30 tablet 5  . Multiple Vitamins-Minerals (MULTIVITAMIN GUMMIES WOMENS PO) Take by mouth.    . olmesartan (BENICAR) 40 MG tablet Take 1 tablet (40 mg total) by mouth daily. 90 tablet 1  . Olopatadine HCl (PAZEO) 0.7 % SOLN Place 1 drop into both eyes 1 day or 1 dose. 2.5 mL 5  . Probiotic Product (PROBIOTIC DAILY PO) Take by mouth.    . verapamil (CALAN-SR) 120 MG CR tablet Take 1 tablet (120 mg total) by mouth daily. 90 tablet 1  . albuterol (VENTOLIN HFA) 108 (90 Base) MCG/ACT inhaler Inhale 2 puffs into the lungs every 6 (six) hours as needed for wheezing or shortness of breath. 1 Inhaler 2   No facility-administered medications  prior to visit.    PAST MEDICAL HISTORY: Past Medical History:  Diagnosis Date  . Allergy   . Back pain   . Chronic RLQ pain    since 1995 when she had ectopic preg  . Constipation   . Deviated septum   . Diverticulitis   . Ectopic pregnancy 1995    x 1 , R side   . GERD (gastroesophageal reflux disease)   . HTN (hypertension) 05/24/2015  . Migraine without aura and without status migrainosus, not intractable   . Miscarriage    x 1  . Obesity, unspecified 07/06/2010  . Pelvic congestion    h/o   . Sleep apnea    uses cpap    PAST SURGICAL HISTORY: Past Surgical History:  Procedure Laterality Date  . ABDOMINAL HYSTERECTOMY  12/2009   [facility]  . BREAST REDUCTION  SURGERY  12/09  . COLONOSCOPY  2001   X3 ; diverticulosis  . FOOT SURGERY    . NASAL SEPTOPLASTY W/ TURBINOPLASTY Bilateral 10/31/2018   Procedure: NASAL SEPTOPLASTY WITH TURBINATE REDUCTION;  Surgeon: Jerrell Belfast, MD;  Location: [facility];  Service: ENT;  Laterality: Bilateral;  . PARTIAL HYSTERECTOMY    . REDUCTION MAMMAPLASTY Bilateral   . RIGHT OOPHORECTOMY  12/2009   [facility]  . TUBAL LIGATION  2000    FAMILY HISTORY: Family History  Problem Relation Age of Onset  . Colon cancer Mother 58  . Colon cancer Maternal Aunt 62  . Stroke Father   . Diabetes Father   . Hypertension Father   . Hyperlipidemia Father   . Heart disease Father   . Obesity Father   . Hypertension Sister   . Breast cancer Other        GM  . Colon polyps Brother   . Colitis Brother   . Heart attack Neg Hx     SOCIAL HISTORY: Social History   Socioeconomic History  . Marital status: Married    Spouse name: Juanda Crumble  . Number of children: 2  . Years of education: Not on file  . Highest education level: Not on file  Occupational History  . Occupation: Counsellor, Par time  . Occupation: works full time at [employer]: [employer]  Tobacco Use  . Smoking status: Never Smoker  . Smokeless tobacco: Never Used  Substance and Sexual Activity  . Alcohol use: Yes    Alcohol/week: 0.0 standard drinks    Comment: socially - 1 drink every 3 months  . Drug use: No  . Sexual activity: Yes    Partners: Male    Birth control/protection: Surgical  Other Topics Concern  . Not on file  Social History Narrative   Household-- pt , husband and children   daughter 70   son 66   Social Determinants of Radio broadcast assistant Strain:   . Difficulty of Paying Living Expenses: Not on file  Food Insecurity:   . Worried About Charity fundraiser in the Last Year: Not on file  . Ran Out of Food in the Last Year: Not on file  Transportation Needs:   . Lack of Transportation  (Medical): Not on file  . Lack of Transportation (Non-Medical): Not on file  Physical Activity:   . Days of Exercise per Week: Not on file  . Minutes of Exercise per Session: Not on file  Stress:   . Feeling of Stress : Not on file  Social Connections:   . Frequency  of Communication with Friends and Family: Not on file  . Frequency of Social Gatherings with Friends and Family: Not on file  . Attends Religious Services: Not on file  . Active Member of Clubs or Organizations: Not on file  . Attends Archivist Meetings: Not on file  . Marital Status: Not on file  Intimate Partner Violence:   . Fear of Current or Ex-Partner: Not on file  . Emotionally Abused: Not on file  . Physically Abused: Not on file  . Sexually Abused: Not on file      PHYSICAL EXAM  Vitals:   12/10/19 0744  BP: 131/86  Pulse: 77  Temp: 97.6 F (36.4 C)  Weight: 234 lb 3.2 oz (106.2 kg)  Height: 5\' 4"  (1.626 m)   Body mass index is 40.2 kg/m.  Generalized: Well developed, in no acute distress  Chest: Lungs clear to auscultation bilaterally  Neurological examination  Mentation: Alert oriented to time, place, history taking. Follows all commands speech and language fluent Cranial nerve II-XII: Extraocular movements were full, visual field were full on confrontational test Head turning and shoulder shrug  were normal and symmetric. Motor: The motor testing reveals 5 over 5 strength of all 4 extremities. Good symmetric motor tone is noted throughout.  Sensory: Sensory testing is intact to soft touch on all 4 extremities. No evidence of extinction is noted.  Gait and station: Gait is normal.     DIAGNOSTIC DATA (LABS, IMAGING, TESTING) - I reviewed patient records, labs, notes, testing and imaging myself where available.  Lab Results  Component Value Date   WBC 5.2 04/22/2019   HGB 13.5 04/22/2019   HCT 40.4 04/22/2019   MCV 90 04/22/2019   PLT 216 04/22/2019      Component Value  Date/Time   NA 138 10/26/2019 1058   K 4.4 10/26/2019 1058   CL 104 10/26/2019 1058   CO2 21 10/26/2019 1058   GLUCOSE 100 (H) 10/26/2019 1058   GLUCOSE 91 10/23/2018 0831   BUN 11 10/26/2019 1058   CREATININE 0.94 10/26/2019 1058   CALCIUM 9.8 10/26/2019 1058   PROT 7.3 04/22/2019 1213   ALBUMIN 4.6 04/22/2019 1213   AST 26 04/22/2019 1213   ALT 28 04/22/2019 1213   ALKPHOS 71 04/22/2019 1213   BILITOT 0.4 04/22/2019 1213   GFRNONAA 71 10/26/2019 1058   GFRAA 82 10/26/2019 1058   Lab Results  Component Value Date   CHOL 202 (H) 04/22/2019   HDL 68 04/22/2019   LDLCALC 109 (H) 04/22/2019   TRIG 123 04/22/2019   CHOLHDL 3.0 04/22/2019   Lab Results  Component Value Date   HGBA1C 5.5 04/22/2019   Lab Results  Component Value Date   VITAMINB12 327 08/27/2018   Lab Results  Component Value Date   TSH 1.140 08/27/2018      ASSESSMENT AND PLAN 49 y.o. year old female  has a past medical history of Allergy, Back pain, Chronic RLQ pain, Constipation, Deviated septum, Diverticulitis, Ectopic pregnancy (1995 ), GERD (gastroesophageal reflux disease), HTN (hypertension) (05/24/2015), Migraine without aura and without status migrainosus, not intractable, Miscarriage, Obesity, unspecified (07/06/2010), Pelvic congestion, and Sleep apnea. here with:  1. Obstructive sleep apnea on CPAP  Patient CPAP download shows excellent compliance and good treatment of her apnea.  She is encouraged to continue using CPAP nightly and greater than 4 hours each night.  She is advised that if her symptoms worsen or she develops new symptoms she should let us  know.  She will follow-up in 1 year or sooner if needed    I spent 15 minutes with the patient. 50% of this time was spent reviewing CPAP download   Ward Givens, MSN, NP-C 12/10/2019, 8:11 AM [facility] [address] [place], [place] [address] [phone]

## 2019-12-10 NOTE — Patient Instructions (Signed)

## 2019-12-11 ENCOUNTER — Ambulatory Visit (INDEPENDENT_AMBULATORY_CARE_PROVIDER_SITE_OTHER): Payer: BC Managed Care – PPO | Admitting: *Deleted

## 2019-12-11 ENCOUNTER — Encounter: Payer: BC Managed Care – PPO | Admitting: Orthotics

## 2019-12-11 ENCOUNTER — Other Ambulatory Visit: Payer: Self-pay

## 2019-12-11 DIAGNOSIS — J309 Allergic rhinitis, unspecified: Secondary | ICD-10-CM | POA: Diagnosis not present

## 2019-12-24 ENCOUNTER — Ambulatory Visit (INDEPENDENT_AMBULATORY_CARE_PROVIDER_SITE_OTHER): Payer: BC Managed Care – PPO

## 2019-12-24 DIAGNOSIS — J309 Allergic rhinitis, unspecified: Secondary | ICD-10-CM

## 2020-01-01 ENCOUNTER — Ambulatory Visit (INDEPENDENT_AMBULATORY_CARE_PROVIDER_SITE_OTHER): Payer: BC Managed Care – PPO | Admitting: *Deleted

## 2020-01-01 DIAGNOSIS — J309 Allergic rhinitis, unspecified: Secondary | ICD-10-CM | POA: Diagnosis not present

## 2020-01-05 ENCOUNTER — Ambulatory Visit (INDEPENDENT_AMBULATORY_CARE_PROVIDER_SITE_OTHER): Payer: BC Managed Care – PPO

## 2020-01-05 DIAGNOSIS — J309 Allergic rhinitis, unspecified: Secondary | ICD-10-CM

## 2020-01-09 ENCOUNTER — Ambulatory Visit: Payer: Self-pay | Attending: Internal Medicine

## 2020-01-09 DIAGNOSIS — Z23 Encounter for immunization: Secondary | ICD-10-CM | POA: Insufficient documentation

## 2020-01-09 NOTE — Progress Notes (Signed)
   Covid-19 Vaccination Clinic  Name:  Magic Capener    MRN: [id number] DOB: 01-15-1971  01/09/2020  [name] was observed post Covid-19 immunization for 15 minutes without incidence. She was provided with Vaccine Information Sheet and instruction to access the V-Safe system.   [name] was instructed to call 911 with any severe reactions post vaccine: [name] Difficulty breathing  . Swelling of your face and throat  . A fast heartbeat  . A bad rash all over your body  . Dizziness and weakness    Immunizations Administered    Name Date Dose VIS Date Route   Pfizer COVID-19 Vaccine 01/09/2020  5:08 PM 0.3 mL 10/23/2019 Intramuscular   Manufacturer: [place]   Lot: [id number]   [place]: [id number]

## 2020-01-15 ENCOUNTER — Ambulatory Visit (INDEPENDENT_AMBULATORY_CARE_PROVIDER_SITE_OTHER): Payer: BC Managed Care – PPO | Admitting: *Deleted

## 2020-01-15 DIAGNOSIS — J309 Allergic rhinitis, unspecified: Secondary | ICD-10-CM | POA: Diagnosis not present

## 2020-01-18 ENCOUNTER — Encounter: Payer: Self-pay | Admitting: Adult Health

## 2020-01-20 ENCOUNTER — Ambulatory Visit: Payer: BC Managed Care – PPO | Admitting: Allergy

## 2020-01-20 ENCOUNTER — Encounter: Payer: Self-pay | Admitting: Allergy

## 2020-01-20 ENCOUNTER — Ambulatory Visit: Payer: Self-pay

## 2020-01-20 ENCOUNTER — Other Ambulatory Visit: Payer: Self-pay

## 2020-01-20 VITALS — BP 126/90 | HR 89 | Temp 98.0°F | Resp 18 | Ht 64.0 in

## 2020-01-20 DIAGNOSIS — J309 Allergic rhinitis, unspecified: Secondary | ICD-10-CM

## 2020-01-20 DIAGNOSIS — J3089 Other allergic rhinitis: Secondary | ICD-10-CM | POA: Diagnosis not present

## 2020-01-20 DIAGNOSIS — H1013 Acute atopic conjunctivitis, bilateral: Secondary | ICD-10-CM

## 2020-01-20 DIAGNOSIS — J45990 Exercise induced bronchospasm: Secondary | ICD-10-CM | POA: Diagnosis not present

## 2020-01-20 NOTE — Patient Instructions (Addendum)
Allergies  -Continue avoidance measures for tree pollens, weed, pollens, grass pollens, molds, dust mite, dog and cockroach  -Continue Xyzal 5mg  daily (if needed may take twice a day)  -Continue Singulair 10mg  daily at bedtime.    -Continue Dymista 1 spray each nostril and increase to twice a day dosing  -Continue use of nasal saline rinse to help flush and clean the nose.  Use prior to medicated nasal spray use.  Use only distilled water or boil water and bring to room/lukewarm temperature prior to use.   - for itchy/watery/red eyes use Pazeo 1 drop each eye daily as needed  -Continue allergen immunotherapy per protocol.  She is building up quite well.  Have access to an epinephrine device on days of your injections.  Exercise induced asthma  - have access to albuterol inhaler 2 puffs every 4-6 hours as needed for cough/wheeze/shortness of breath/chest tightness.  May use 15-20 minutes prior to activity.   Monitor frequency of use.    Follow-up 6 months or sooner if needed

## 2020-01-20 NOTE — Progress Notes (Signed)
Follow-up Note  RE: Desiree Dunn MRN: [id number] DOB: [birth year] Date of Office Visit: 01/20/2020   History of present illness: Desiree Dunn is a 49 y.o. female presenting today for follow-up of allergic rhinitis with conjunctivitis and exercise-induced asthma.  She was last seen in the office on September 18, 2019 by myself.  Since this visit she was started on allergen immunotherapy and is tolerating injections well without any large local or systemic reactions.  She states however last week she has had increase in sinus pressure and nasal congestion symptoms.  She states she is a little congested today as well.  She is taking her Xyzal and Singulair daily as well as using Dymista 1 spray each nostril once a day.  She is also performing her saline rinses.  She states she has been walking and she will pretreat with albuterol prior to exercise.  If she does pretreat with albuterol she typically does not need to use Breo because she is symptomatic.  She did get the first dose of the [employer] Covid vaccine on February 27 and is due for her second dose on March 20.  She tolerated her first dose well.  Review of systems: Review of Systems  Constitutional: Negative.   HENT: Positive for congestion.   Eyes: Negative.   Respiratory: Negative.   Cardiovascular: Negative.   Gastrointestinal: Negative.   Musculoskeletal: Negative.   Skin: Negative.   Neurological: Negative.     All other systems negative unless noted above in HPI  Past medical/social/surgical/family history have been reviewed and are unchanged unless specifically indicated below.  No changes  Medication List: Current Outpatient Medications  Medication Sig Dispense Refill  . acetaminophen (TYLENOL) 500 MG tablet Take 1,000 mg by mouth as needed for mild pain or headache.    . albuterol (VENTOLIN HFA) 108 (90 Base) MCG/ACT inhaler Inhale 2 puffs into the lungs every 6 (six) hours as needed for wheezing or  shortness of breath. 1 Inhaler 2  . albuterol (VENTOLIN HFA) 108 (90 Base) MCG/ACT inhaler Inhale 2 puffs into the lungs every 4 (four) hours as needed. 18 g 1  . Azelastine-Fluticasone 137-50 MCG/ACT SUSP Place 1 spray into the nose 2 (two) times daily. 23 g 5  . EPINEPHrine (AUVI-Q) 0.3 mg/0.3 mL IJ SOAJ injection Inject 0.3 mLs (0.3 mg total) into the muscle as needed. 4 each 2  . ibuprofen (ADVIL,MOTRIN) 200 MG tablet Take 400 mg by mouth as needed for moderate pain.    [name] levocetirizine (XYZAL) 5 MG tablet Take 1 tablet (5 mg total) by mouth every evening. 30 tablet 5  . montelukast (SINGULAIR) 10 MG tablet Take 1 tablet (10 mg total) by mouth at bedtime. 30 tablet 5  . Multiple Vitamins-Minerals (MULTIVITAMIN GUMMIES WOMENS PO) Take by mouth.    . olmesartan (BENICAR) 40 MG tablet Take 1 tablet (40 mg total) by mouth daily. 90 tablet 1  . Olopatadine HCl (PAZEO) 0.7 % SOLN Place 1 drop into both eyes 1 day or 1 dose. 2.5 mL 5  . Probiotic Product (PROBIOTIC DAILY PO) Take by mouth.    . verapamil (CALAN-SR) 120 MG CR tablet Take 1 tablet (120 mg total) by mouth daily. 90 tablet 1   No current facility-administered medications for this visit.     Known medication allergies: Allergies  Allergen Reactions  . Amoxicillin Hives    Hives     Physical examination: Blood pressure 126/90, pulse 89, temperature 98 F (36.7 C), temperature source Temporal,  resp. rate 18, height 5\' 4"  (1.626 m), SpO2 98 %.  General: Alert, interactive, in no acute distress. HEENT: PERRLA, TMs pearly gray, turbinates moderately edematous without discharge, post-pharynx non erythematous. Neck: Supple without lymphadenopathy. Lungs: Clear to auscultation without wheezing, rhonchi or rales. {no increased work of breathing. CV: Normal S1, S2 without murmurs. Abdomen: Nondistended, nontender. Skin: Warm and dry, without lesions or rashes. Extremities:  No clubbing, cyanosis or edema. Neuro:   Grossly  intact.  Diagnositics/Labs: Received allergen injections today  Assessment and plan: Allergic rhinitis with conjunctivitis  -Continue avoidance measures for tree pollens, weed, pollens, grass pollens, molds, dust mite, dog and cockroach  -Continue Xyzal 5mg  daily (if needed may take twice a day)  -Continue Singulair 10mg  daily at bedtime.    -Continue Dymista 1 spray each nostril and increase to twice a day dosing  -Continue use of nasal saline rinse to help flush and clean the nose.  Use prior to medicated nasal spray use.  Use only distilled water or boil water and bring to room/lukewarm temperature prior to use.   - for itchy/watery/red eyes use Pazeo 1 drop each eye daily as needed  -Continue allergen immunotherapy per protocol.  She is building up quite well.  Have access to an epinephrine device on days of your injections.  Exercise induced asthma  - have access to albuterol inhaler 2 puffs every 4-6 hours as needed for cough/wheeze/shortness of breath/chest tightness.  May use 15-20 minutes prior to activity.   Monitor frequency of use.    Follow-up 6 months or sooner if needed  I appreciate the opportunity to take part in Ai's care. Please do not hesitate to contact me with questions.  Sincerely,   Prudy Feeler, MD Allergy/Immunology [facility] [place] [place] [facility] [place]

## 2020-01-25 ENCOUNTER — Encounter: Payer: Self-pay | Admitting: Internal Medicine

## 2020-01-25 DIAGNOSIS — U071 COVID-19: Secondary | ICD-10-CM | POA: Diagnosis not present

## 2020-01-25 DIAGNOSIS — M791 Myalgia, unspecified site: Secondary | ICD-10-CM | POA: Diagnosis not present

## 2020-01-25 DIAGNOSIS — Z20828 Contact with and (suspected) exposure to other viral communicable diseases: Secondary | ICD-10-CM | POA: Diagnosis not present

## 2020-01-25 DIAGNOSIS — Z03818 Encounter for observation for suspected exposure to other biological agents ruled out: Secondary | ICD-10-CM | POA: Diagnosis not present

## 2020-01-26 ENCOUNTER — Other Ambulatory Visit: Payer: Self-pay

## 2020-01-26 ENCOUNTER — Encounter: Payer: Self-pay | Admitting: Nurse Practitioner

## 2020-01-26 ENCOUNTER — Telehealth (INDEPENDENT_AMBULATORY_CARE_PROVIDER_SITE_OTHER): Payer: BC Managed Care – PPO | Admitting: Nurse Practitioner

## 2020-01-26 ENCOUNTER — Telehealth: Payer: Self-pay

## 2020-01-26 VITALS — BP 138/98 | Temp 97.5°F | Ht 64.0 in | Wt 228.0 lb

## 2020-01-26 DIAGNOSIS — U071 COVID-19: Secondary | ICD-10-CM

## 2020-01-26 DIAGNOSIS — R05 Cough: Secondary | ICD-10-CM

## 2020-01-26 DIAGNOSIS — R059 Cough, unspecified: Secondary | ICD-10-CM

## 2020-01-26 MED ORDER — FLUCONAZOLE 100 MG PO TABS: 100.0000 mg | ORAL_TABLET | Freq: Every day | ORAL | 0 refills | Status: DC

## 2020-01-26 MED ORDER — HYDROCODONE-HOMATROPINE 5-1.5 MG/5ML PO SYRP: 5.0000 mL | ORAL_SOLUTION | Freq: Four times a day (QID) | ORAL | 0 refills | Status: DC | PRN

## 2020-01-26 MED ORDER — AZITHROMYCIN 250 MG PO TABS: ORAL_TABLET | ORAL | 0 refills | Status: AC

## 2020-01-26 NOTE — Telephone Encounter (Signed)
Ok thanks for letting us know.  How is she doing?   Fortunately she had her 1st Covid vaccine dose prior to this so she will have some protection in that aspect.   She will need to postpone her second Covid vaccine until she has completed appropriate quarantine time.    She and staff at time of visit had appropriate PPE wear.      Recommendations for at home Covid symptoms management:  Please continue isolation at home. If have acute worsening of symptoms please go to ER/urgent care for further evaluation. Check pulse oximetry and if below 90-92% please go to ER. The following supplements may help:  Vitamin C 500mg  twice a day and Quercetin 250-500 mg twice a day Vitamin D3 2000 - 4000 u/day B Complex vitamins Zinc 75-100 mg/day Melatonin 6-10 mg at night (the optimal dose is unknown) Aspirin 81mg /day (if no history of bleeding issues)

## 2020-01-26 NOTE — Telephone Encounter (Signed)
Patient is not feeling well. Mainly body aches, congestion, headache, low grade fever. She thanked me for checking in. Patient will call back with any concerns. I did let her know when to seek medical attention. I also sent the recommendations to her via a MyChart message so that she could have them for reference. She did have a remote visit with her provider today.

## 2020-01-26 NOTE — Progress Notes (Signed)
Virtual Visit via Video   This visit type was conducted due to national recommendations for restrictions regarding the COVID-19 Pandemic (e.g. social distancing) in an effort to limit this patient's exposure and mitigate transmission in our community.  Due to her co-morbid illnesses, this patient is at least at moderate risk for complications without adequate follow up.  This format is felt to be most appropriate for this patient at this time.  All issues noted in this document were discussed and addressed.  A limited physical exam was performed with this format.    This visit type was conducted due to national recommendations for restrictions regarding the COVID-19 Pandemic (e.g. social distancing) in an effort to limit this patient's exposure and mitigate transmission in our community.  Patients identity confirmed using two different identifiers.  This format is felt to be most appropriate for this patient at this time.  All issues noted in this document were discussed and addressed.  No physical exam was performed (except for noted visual exam findings with Video Visits).    Date:  01/31/2020   ID:  Desiree Dunn, DOB 1971/01/11, MRN [id number]  Patient Location:  Home - spoke with Desiree Dunn  Provider location:   Office    Chief Complaint:  Positive for covid  History of Present Illness:    Desiree Dunn is a 49 y.o. female who presents via video conferencing for a telehealth visit today.    The patient does have symptoms concerning for COVID-19 infection (fever, chills, cough, or new shortness of breath).   HPI  Last week had a cough and on Friday felt really heavy. Since Saturday she got worse. Her chest is feeling uncomfortable. Hurts to cough and has chills. She has her heater on with multiple blankets.  She has taken mucinex this am.  Has not any Tylenol. Test[facility] [place] came up positive. She works at [employer]. 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  She had her first covid vaccine on February 23rd.   Past Medical History:  Diagnosis Date  . Allergy   . Back pain   . Chronic RLQ pain    since 1995 when she had ectopic preg  . Constipation   . Deviated septum   . Diverticulitis   . Ectopic pregnancy 1995    x 1 , R side   . GERD (gastroesophageal reflux disease)   . HTN (hypertension) 05/24/2015  . Migraine without aura and without status migrainosus, not intractable   . Miscarriage    x 1  . Obesity, unspecified 07/06/2010  . Pelvic congestion    h/o   . Sleep apnea    uses cpap   Past Surgical History:  Procedure Laterality Date  . ABDOMINAL HYSTERECTOMY  12/2009   [facility]  . BREAST REDUCTION SURGERY  12/09  . COLONOSCOPY  2001   X3 ; diverticulosis  . FOOT SURGERY    . NASAL SEPTOPLASTY W/ TURBINOPLASTY Bilateral 10/31/2018   Procedure: NASAL SEPTOPLASTY WITH TURBINATE REDUCTION;  Surgeon: Jerrell Belfast, MD;  Location: [facility];  Service: ENT;  Laterality: Bilateral;  . PARTIAL HYSTERECTOMY    . REDUCTION MAMMAPLASTY Bilateral   . RIGHT OOPHORECTOMY  12/2009   [facility]  . TUBAL LIGATION  2000     Current Meds  Medication Sig  . albuterol (VENTOLIN HFA) 108 (90 Base) MCG/ACT inhaler Inhale 2 puffs into the lungs every 4 (four) hours as needed.  . Azelastine-Fluticasone 137-50  MCG/ACT SUSP Place 1 spray into the nose 2 (two) times daily.  [name] EPINEPHrine (AUVI-Q) 0.3 mg/0.3 mL IJ SOAJ injection Inject 0.3 mLs (0.3 mg total) into the muscle as needed.  [name] levocetirizine (XYZAL) 5 MG tablet Take 1 tablet (5 mg total) by mouth every evening.  . montelukast (SINGULAIR) 10 MG tablet Take 1 tablet (10 mg total) by mouth at bedtime.  . Multiple Vitamins-Minerals (MULTIVITAMIN GUMMIES WOMENS PO) Take by mouth.  . olmesartan (BENICAR) 40 MG tablet Take 1 tablet (40 mg total) by mouth daily.  . Olopatadine HCl (PAZEO) 0.7 % SOLN Place 1 drop into both eyes 1 day or 1 dose.  .  Probiotic Product (PROBIOTIC DAILY PO) Take by mouth.  . TURMERIC PO Take by mouth. Take one tablet daily  . verapamil (CALAN-SR) 120 MG CR tablet Take 1 tablet (120 mg total) by mouth daily.  . [DISCONTINUED] albuterol (VENTOLIN HFA) 108 (90 Base) MCG/ACT inhaler Inhale 2 puffs into the lungs every 6 (six) hours as needed for wheezing or shortness of breath.     Allergies:   Amoxicillin   Social History   Tobacco Use  . Smoking status: Never Smoker  . Smokeless tobacco: Never Used  Substance Use Topics  . Alcohol use: Yes    Alcohol/week: 0.0 standard drinks    Comment: socially - 1 drink every 3 months  . Drug use: No     Family Hx: The patient's family history includes Breast cancer in an other family member; Colitis in her brother; Colon cancer (age of onset: 53) in her maternal aunt; Colon cancer (age of onset: 32) in her mother; Colon polyps in her brother; Diabetes in her father; Heart disease in her father; Hyperlipidemia in her father; Hypertension in her father and sister; Obesity in her father; Stroke in her father. There is no history of Heart attack.  ROS:   Please see the history of present illness.    Review of Systems  Constitutional: Positive for chills. Negative for fever.  Respiratory: Positive for cough. Negative for shortness of breath and wheezing.   Cardiovascular: Negative.   Neurological: Negative for dizziness and headaches.  Psychiatric/Behavioral: Negative.     All other systems reviewed and are negative.   Labs/Other Tests and Data Reviewed:    Recent Labs: 04/22/2019: ALT 28; Hemoglobin 13.5; Platelets 216 10/26/2019: BUN 11; Creatinine, Ser 0.94; Potassium 4.4; Sodium 138   Recent Lipid Panel Lab Results  Component Value Date/Time   CHOL 202 (H) 04/22/2019 12:13 PM   TRIG 123 04/22/2019 12:13 PM   HDL 68 04/22/2019 12:13 PM   CHOLHDL 3.0 04/22/2019 12:13 PM   CHOLHDL 3 01/14/2017 03:29 PM   LDLCALC 109 (H) 04/22/2019 12:13 PM    Wt  Readings from Last 3 Encounters:  01/26/20 228 lb (103.4 kg)  12/10/19 234 lb 3.2 oz (106.2 kg)  10/26/19 229 lb 12.8 oz (104.2 kg)     Exam:    Vital Signs:  BP (!) 138/98 (BP Location: Left Arm, Patient Position: Sitting, Cuff Size: Large)   Temp (!) 97.5 F (36.4 C) (Oral)   Ht 5\' 4"  (1.626 m)   Wt 228 lb (103.4 kg)   BMI 39.14 kg/m     Physical Exam  Constitutional: She is oriented to person, place, and time and well-developed, well-nourished, and in no distress. No distress.  Pulmonary/Chest: Effort normal. No respiratory distress. She has no wheezes.  She is coughing while doing the visit  Neurological: She is  alert and oriented to person, place, and time.  Psychiatric: Mood, memory, affect and judgment normal.    ASSESSMENT & PLAN:     1. COVID-19  Diagnosed 3 days ago, she is to remain in quarantine until 02/02/2020   Will have Remote Health to make a visit - HYDROcodone-homatropine (HYDROMET) 5-1.5 MG/5ML syrup; Take 5 mLs by mouth every 6 (six) hours as needed.  Dispense: 120 mL; Refill: 0 - fluconazole (DIFLUCAN) 100 MG tablet; Take 1 tablet (100 mg total) by mouth daily. Take 1 tablet by mouth now repeat in 5 days  Dispense: 2 tablet; Refill: 0 - azithromycin (ZITHROMAX) 250 MG tablet; Take 2 tablets (500 mg) on  Day 1,  followed by 1 tablet (250 mg) once daily on Days 2 through 5.  Dispense: 6 each; Refill: 0  2. Cough  Will treat with antibiotic and cough syrup to allow her to sleep better from the cough - HYDROcodone-homatropine (HYDROMET) 5-1.5 MG/5ML syrup; Take 5 mLs by mouth every 6 (six) hours as needed.  Dispense: 120 mL; Refill: 0 - fluconazole (DIFLUCAN) 100 MG tablet; Take 1 tablet (100 mg total) by mouth daily. Take 1 tablet by mouth now repeat in 5 days  Dispense: 2 tablet; Refill: 0 - azithromycin (ZITHROMAX) 250 MG tablet; Take 2 tablets (500 mg) on  Day 1,  followed by 1 tablet (250 mg) once daily on Days 2 through 5.  Dispense: 6 each; Refill:  0   COVID-19 Education: The signs and symptoms of COVID-19 were discussed with the patient and how to seek care for testing (follow up with PCP or arrange E-visit).  The importance of social distancing was discussed today.  Patient Risk:   After full review of this patients clinical status, I feel that they are at least moderate risk at this time.  Time:   Today, I have spent 13 minutes/ seconds with the patient with telehealth technology discussing above diagnoses.     Medication Adjustments/Labs and Tests Ordered: Current medicines are reviewed at length with the patient today.  Concerns regarding medicines are outlined above.   Tests Ordered: No orders of the defined types were placed in this encounter.   Medication Changes: Meds ordered this encounter  Medications  . HYDROcodone-homatropine (HYDROMET) 5-1.5 MG/5ML syrup    Sig: Take 5 mLs by mouth every 6 (six) hours as needed.    Dispense:  120 mL    Refill:  0  . fluconazole (DIFLUCAN) 100 MG tablet    Sig: Take 1 tablet (100 mg total) by mouth daily. Take 1 tablet by mouth now repeat in 5 days    Dispense:  2 tablet    Refill:  0  . azithromycin (ZITHROMAX) 250 MG tablet    Sig: Take 2 tablets (500 mg) on  Day 1,  followed by 1 tablet (250 mg) once daily on Days 2 through 5.    Dispense:  6 each    Refill:  0    Disposition:  Follow up prn  Signed, Minette Brine, FNP

## 2020-01-26 NOTE — Telephone Encounter (Signed)
Patient tested positive for COVID-19 on 01-25-2020. She was in the office for office visit & injection 01-20-2020.

## 2020-01-27 NOTE — Telephone Encounter (Signed)
Do we know if she had her testing thru [facility].  She may qualify for antibody therapy.  We can provide her (if she has not gotten the number already) with the number to call to see if she does if she continues to be symptomatic.

## 2020-01-27 NOTE — Telephone Encounter (Signed)
Left a message to discuss about monoclonal antibodies testing for patient.

## 2020-01-28 NOTE — Telephone Encounter (Signed)
Called patient and left message to see how patient is doing and to see what we can do for her regarding her symptoms.

## 2020-01-30 ENCOUNTER — Ambulatory Visit: Payer: Self-pay

## 2020-01-31 ENCOUNTER — Encounter: Payer: Self-pay | Admitting: Nurse Practitioner

## 2020-02-01 NOTE — Telephone Encounter (Signed)
Called and spoke with the patient and she states that she is feeling much better after this weekend. She states that her only symptom now is a residual cough. She states that she had her COVID testing done through [facility]. She states that she is in the process of [facility] to see if she is still positive. She states that she does not need anything as of right now but will let us know. Advised to patient that if her COVID test is negative she is more than welcome to come back into the office for her allergy shot. Patient verbalized understanding.

## 2020-02-02 NOTE — Telephone Encounter (Signed)
Called and left a voicemail for the patient asking to return call to inform.

## 2020-02-02 NOTE — Telephone Encounter (Signed)
Please let pt know that her Covid testing can remain positive to weeks to months even.  Thus do not recommend getting repeat testing.  As long as she has completed the isolation period and symptoms have resolved or significantly improved then she is not considered contagious.  Now if she develops new symptoms or symptoms begin to worsen again then yes she should resume isolation period.

## 2020-02-03 ENCOUNTER — Ambulatory Visit: Payer: BC Managed Care – PPO | Admitting: Internal Medicine

## 2020-02-03 NOTE — Telephone Encounter (Signed)
Called and left a voicemail asking for patient to call back to inform.

## 2020-02-03 NOTE — Telephone Encounter (Signed)
Patient called back and was relayed the message about repeat COVID testing. Patient verbalized understanding and stated that her husband just tested positive two days ago and they are residing in the same household. Confirmed with Dr. Nelva Bush that patient should wait until her husband has completed the isolation period without symptoms, then it would be ok for her to come in and get her allergy injection. Patient verbalized understanding.

## 2020-02-08 DIAGNOSIS — G4733 Obstructive sleep apnea (adult) (pediatric): Secondary | ICD-10-CM | POA: Diagnosis not present

## 2020-02-10 ENCOUNTER — Encounter: Payer: Self-pay | Admitting: Internal Medicine

## 2020-02-18 ENCOUNTER — Ambulatory Visit (INDEPENDENT_AMBULATORY_CARE_PROVIDER_SITE_OTHER): Payer: BC Managed Care – PPO

## 2020-02-18 DIAGNOSIS — J309 Allergic rhinitis, unspecified: Secondary | ICD-10-CM

## 2020-02-24 ENCOUNTER — Ambulatory Visit: Payer: Self-pay

## 2020-02-26 ENCOUNTER — Ambulatory Visit (INDEPENDENT_AMBULATORY_CARE_PROVIDER_SITE_OTHER): Payer: BC Managed Care – PPO

## 2020-02-26 DIAGNOSIS — J309 Allergic rhinitis, unspecified: Secondary | ICD-10-CM

## 2020-02-28 ENCOUNTER — Other Ambulatory Visit: Payer: Self-pay | Admitting: Allergy

## 2020-03-02 ENCOUNTER — Ambulatory Visit: Payer: Self-pay | Attending: Internal Medicine

## 2020-03-02 DIAGNOSIS — Z23 Encounter for immunization: Secondary | ICD-10-CM

## 2020-03-02 NOTE — Progress Notes (Signed)
   Covid-19 Vaccination Clinic  Name:  Desiree Dunn    MRN: [id number] DOB: 07-06-1971  03/02/2020  Desiree Dunn was observed post Covid-19 immunization for 15 minutes without incident. She was provided with Vaccine Information Sheet and instruction to access the V-Safe system.   Desiree Dunn was instructed to call 911 with any severe reactions post vaccine: [name] Difficulty breathing  . Swelling of face and throat  . A fast heartbeat  . A bad rash all over body  . Dizziness and weakness   Immunizations Administered    Name Date Dose VIS Date Route   Pfizer COVID-19 Vaccine 03/02/2020  2:56 PM 0.3 mL 01/06/2019 Intramuscular   Manufacturer: [place]   Lot: [id number]   [place]: [id number]

## 2020-03-04 ENCOUNTER — Ambulatory Visit (INDEPENDENT_AMBULATORY_CARE_PROVIDER_SITE_OTHER): Payer: BC Managed Care – PPO

## 2020-03-04 DIAGNOSIS — J309 Allergic rhinitis, unspecified: Secondary | ICD-10-CM | POA: Diagnosis not present

## 2020-03-11 ENCOUNTER — Ambulatory Visit (INDEPENDENT_AMBULATORY_CARE_PROVIDER_SITE_OTHER): Payer: BC Managed Care – PPO

## 2020-03-11 DIAGNOSIS — J309 Allergic rhinitis, unspecified: Secondary | ICD-10-CM | POA: Diagnosis not present

## 2020-03-17 ENCOUNTER — Ambulatory Visit (INDEPENDENT_AMBULATORY_CARE_PROVIDER_SITE_OTHER): Payer: BC Managed Care – PPO

## 2020-03-17 DIAGNOSIS — J309 Allergic rhinitis, unspecified: Secondary | ICD-10-CM | POA: Diagnosis not present

## 2020-03-21 ENCOUNTER — Other Ambulatory Visit: Payer: Self-pay | Admitting: Allergy

## 2020-03-24 ENCOUNTER — Ambulatory Visit (INDEPENDENT_AMBULATORY_CARE_PROVIDER_SITE_OTHER): Payer: BC Managed Care – PPO

## 2020-03-24 DIAGNOSIS — J309 Allergic rhinitis, unspecified: Secondary | ICD-10-CM | POA: Diagnosis not present

## 2020-04-01 ENCOUNTER — Ambulatory Visit (INDEPENDENT_AMBULATORY_CARE_PROVIDER_SITE_OTHER): Payer: BC Managed Care – PPO | Admitting: *Deleted

## 2020-04-01 DIAGNOSIS — J309 Allergic rhinitis, unspecified: Secondary | ICD-10-CM | POA: Diagnosis not present

## 2020-04-08 ENCOUNTER — Ambulatory Visit (INDEPENDENT_AMBULATORY_CARE_PROVIDER_SITE_OTHER): Payer: BC Managed Care – PPO

## 2020-04-08 DIAGNOSIS — J309 Allergic rhinitis, unspecified: Secondary | ICD-10-CM | POA: Diagnosis not present

## 2020-04-15 ENCOUNTER — Ambulatory Visit (INDEPENDENT_AMBULATORY_CARE_PROVIDER_SITE_OTHER): Payer: BC Managed Care – PPO

## 2020-04-15 DIAGNOSIS — J309 Allergic rhinitis, unspecified: Secondary | ICD-10-CM | POA: Diagnosis not present

## 2020-04-22 ENCOUNTER — Ambulatory Visit (INDEPENDENT_AMBULATORY_CARE_PROVIDER_SITE_OTHER): Payer: BC Managed Care – PPO

## 2020-04-22 DIAGNOSIS — J309 Allergic rhinitis, unspecified: Secondary | ICD-10-CM

## 2020-04-25 ENCOUNTER — Other Ambulatory Visit: Payer: Self-pay | Admitting: Internal Medicine

## 2020-04-25 DIAGNOSIS — Z1231 Encounter for screening mammogram for malignant neoplasm of breast: Secondary | ICD-10-CM

## 2020-04-28 ENCOUNTER — Ambulatory Visit: Payer: BC Managed Care – PPO | Admitting: Internal Medicine

## 2020-04-28 ENCOUNTER — Encounter: Payer: Self-pay | Admitting: Internal Medicine

## 2020-04-28 ENCOUNTER — Other Ambulatory Visit: Payer: Self-pay

## 2020-04-28 VITALS — BP 114/82 | HR 77 | Temp 98.0°F | Ht 65.2 in | Wt 236.6 lb

## 2020-04-28 DIAGNOSIS — I10 Essential (primary) hypertension: Secondary | ICD-10-CM

## 2020-04-28 DIAGNOSIS — F4320 Adjustment disorder, unspecified: Secondary | ICD-10-CM | POA: Diagnosis not present

## 2020-04-28 DIAGNOSIS — Z1211 Encounter for screening for malignant neoplasm of colon: Secondary | ICD-10-CM | POA: Diagnosis not present

## 2020-04-28 DIAGNOSIS — Z0000 Encounter for general adult medical examination without abnormal findings: Secondary | ICD-10-CM | POA: Diagnosis not present

## 2020-04-28 DIAGNOSIS — Z1159 Encounter for screening for other viral diseases: Secondary | ICD-10-CM

## 2020-04-28 DIAGNOSIS — Z6839 Body mass index (BMI) 39.0-39.9, adult: Secondary | ICD-10-CM

## 2020-04-28 DIAGNOSIS — Z800 Family history of malignant neoplasm of digestive organs: Secondary | ICD-10-CM | POA: Diagnosis not present

## 2020-04-28 DIAGNOSIS — Z79899 Other long term (current) drug therapy: Secondary | ICD-10-CM | POA: Diagnosis not present

## 2020-04-28 DIAGNOSIS — R7309 Other abnormal glucose: Secondary | ICD-10-CM | POA: Diagnosis not present

## 2020-04-28 LAB — POCT URINALYSIS DIPSTICK
Bilirubin, UA: NEGATIVE
Blood, UA: NEGATIVE
Glucose, UA: NEGATIVE
Ketones, UA: NEGATIVE
Leukocytes, UA: NEGATIVE
Nitrite, UA: NEGATIVE
Protein, UA: NEGATIVE
Spec Grav, UA: 1.01 (ref 1.010–1.025)
Urobilinogen, UA: 0.2 E.U./dL
pH, UA: 6 (ref 5.0–8.0)

## 2020-04-28 LAB — POCT UA - MICROALBUMIN
Albumin/Creatinine Ratio, Urine, POC: <30
Creatinine, POC: 200 mg/dL
Microalbumin Ur, POC: 10 mg/L

## 2020-04-28 NOTE — Progress Notes (Signed)
This visit occurred during the SARS-CoV-2 public health emergency.  Safety protocols were in place, including screening questions prior to the visit, additional usage of staff PPE, and extensive cleaning of exam room while observing appropriate contact time as indicated for disinfecting solutions.  Subjective:     Patient ID: Desiree Dunn , female    DOB: 23-Feb-1971 , 49 y.o.   MRN: [id number]   Chief Complaint  Patient presents with  . Annual Exam    colonoscopy  . Hypertension    HPI  She is here today for a full physical exam. She is followed by Dr. Garwin Brothers for her GYN care. She is scheduled for mammogram at [facility] on June 23rd. She reports she is also due for colonoscopy. Reports her Mom passed from colon cancer.  Hypertension This is a chronic problem. The current episode started more than 1 year ago. The problem has been gradually improving since onset. The problem is controlled. Pertinent negatives include no blurred vision, chest pain, palpitations or shortness of breath. Risk factors for coronary artery disease include obesity. Past treatments include ACE inhibitors. The current treatment provides moderate improvement. Compliance problems include exercise.      Past Medical History:  Diagnosis Date  . Allergy   . Back pain   . Chronic RLQ pain    since 1995 when she had ectopic preg  . Constipation   . Deviated septum   . Diverticulitis   . Ectopic pregnancy 1995    x 1 , R side   . GERD (gastroesophageal reflux disease)   . HTN (hypertension) 05/24/2015  . Migraine without aura and without status migrainosus, not intractable   . Miscarriage    x 1  . Obesity, unspecified 07/06/2010  . Pelvic congestion    h/o   . Sleep apnea    uses cpap     Family History  Problem Relation Age of Onset  . Colon cancer Mother 67  . Colon cancer Maternal Aunt 43  . Stroke Father   . Diabetes Father   . Hypertension Father   . Hyperlipidemia Father   . Heart  disease Father   . Obesity Father   . Hypertension Sister   . Breast cancer Other        GM  . Colon polyps Brother   . Colitis Brother   . Depression Son   . Asperger's syndrome Son   . Heart attack Neg Hx      Current Outpatient Medications:  .  acetaminophen (TYLENOL) 500 MG tablet, Take 1,000 mg by mouth as needed for mild pain or headache., Disp: , Rfl:  .  albuterol (VENTOLIN HFA) 108 (90 Base) MCG/ACT inhaler, Inhale 2 puffs into the lungs every 4 (four) hours as needed., Disp: 18 g, Rfl: 1 .  Azelastine-Fluticasone 137-50 MCG/ACT SUSP, Place 1 spray into the nose 2 (two) times daily., Disp: 23 g, Rfl: 5 .  EPINEPHrine (AUVI-Q) 0.3 mg/0.3 mL IJ SOAJ injection, Inject 0.3 mLs (0.3 mg total) into the muscle as needed., Disp: 4 each, Rfl: 2 .  ibuprofen (ADVIL,MOTRIN) 200 MG tablet, Take 400 mg by mouth as needed for moderate pain., Disp: , Rfl:  .  levocetirizine (XYZAL) 5 MG tablet, TAKE 1 TABLET BY MOUTH EVERY DAY IN THE EVENING, Disp: 90 tablet, Rfl: 0 .  montelukast (SINGULAIR) 10 MG tablet, TAKE 1 TABLET BY MOUTH EVERYDAY AT BEDTIME, Disp: 90 tablet, Rfl: 0 .  Multiple Vitamins-Minerals (MULTIVITAMIN GUMMIES WOMENS PO), Take by mouth.,  Disp: , Rfl:  .  olmesartan (BENICAR) 40 MG tablet, Take 1 tablet (40 mg total) by mouth daily., Disp: 90 tablet, Rfl: 1 .  Olopatadine HCl (PAZEO) 0.7 % SOLN, Place 1 drop into both eyes 1 day or 1 dose., Disp: 2.5 mL, Rfl: 5 .  Probiotic Product (PROBIOTIC DAILY PO), Take by mouth., Disp: , Rfl:  .  TURMERIC PO, Take by mouth. Take one tablet daily, Disp: , Rfl:  .  verapamil (CALAN-SR) 120 MG CR tablet, Take 1 tablet (120 mg total) by mouth daily., Disp: 90 tablet, Rfl: 1   Allergies  Allergen Reactions  . Amoxicillin Hives    Hives     The patient states she uses status post hysterectomy for birth control. Last LMP was No LMP recorded. Patient has had a hysterectomy.. Negative for Dysmenorrhea  Negative for: breast discharge, breast  lump(s), breast pain and breast self exam. Associated symptoms include abnormal vaginal bleeding. Pertinent negatives include abnormal bleeding (hematology), anxiety, decreased libido, depression, difficulty falling sleep, dyspareunia, history of infertility, nocturia, sexual dysfunction, sleep disturbances, urinary incontinence, urinary urgency, vaginal discharge and vaginal itching. Diet regular.The patient states her exercise level is  intermittent.  . The patient's tobacco use is:  Social History   Tobacco Use  Smoking Status Never Smoker  Smokeless Tobacco Never Used  . She has been exposed to passive smoke. The patient's alcohol use is:  Social History   Substance and Sexual Activity  Alcohol Use Yes  . Alcohol/week: 0.0 standard drinks   Comment: socially - 1 drink every 3 months    Review of Systems  Constitutional: Negative.   HENT: Negative.   Eyes: Negative.  Negative for blurred vision.  Respiratory: Negative.  Negative for shortness of breath.   Cardiovascular: Negative.  Negative for chest pain and palpitations.  Endocrine: Negative.   Genitourinary: Negative.   Musculoskeletal: Negative.   Skin: Negative.   Allergic/Immunologic: Negative.   Neurological: Negative.   Hematological: Negative.   Psychiatric/Behavioral: Positive for dysphoric mood.       She admits that she has been stressed for the past several weeks. She has been having issues with her son. She reports he has recently been diagnosed with some medical issues. She is interested in "talking" with someone.      Today's Vitals   04/28/20 0943  BP: 114/82  Pulse: 77  Temp: 98 F (36.7 C)  TempSrc: Oral  Weight: 236 lb 9.6 oz (107.3 kg)  Height: 5' 5.2" (1.656 m)   Body mass index is 39.13 kg/m.   Objective:  Physical Exam Vitals and nursing note reviewed.  Constitutional:      Appearance: Normal appearance.  HENT:     Head: Normocephalic and atraumatic.     Right Ear: Tympanic membrane, ear  canal and external ear normal.     Left Ear: Tympanic membrane, ear canal and external ear normal.     Nose:     Comments: Deferred, masked    Mouth/Throat:     Comments: Deferred, masked Eyes:     Extraocular Movements: Extraocular movements intact.     Conjunctiva/sclera: Conjunctivae normal.     Pupils: Pupils are equal, round, and reactive to light.  Cardiovascular:     Rate and Rhythm: Normal rate and regular rhythm.     Pulses: Normal pulses.     Heart sounds: Normal heart sounds.  Pulmonary:     Effort: Pulmonary effort is normal.     Breath sounds: Normal  breath sounds.  Chest:     Breasts: Tanner Score is 5.        Right: Normal.        Left: Normal.     Comments: Healed surgical scars Abdominal:     General: Bowel sounds are normal.     Palpations: Abdomen is soft.     Comments: Obese, soft. Difficult to assess organomegaly.   Genitourinary:    Comments: deferred Musculoskeletal:        General: Normal range of motion.     Cervical back: Normal range of motion and neck supple.  Skin:    General: Skin is warm and dry.  Neurological:     General: No focal deficit present.     Mental Status: She is alert and oriented to person, place, and time.  Psychiatric:        Mood and Affect: Mood normal.        Behavior: Behavior normal.         Assessment And Plan:     1. Routine general medical examination at health care facility  A full examination was performed.  Importance of monthly self breast exams was discussed with the patient. PATIENT IS ADVISED TO GET 30-45 MINUTES REGULAR EXERCISE NO LESS THAN FOUR TO FIVE DAYS PER WEEK - BOTH WEIGHTBEARING EXERCISES AND AEROBIC ARE RECOMMENDED.  HE/SHE IS ADVISED TO FOLLOW A HEALTHY DIET WITH AT LEAST SIX FRUITS/VEGGIES PER DAY, DECREASE INTAKE OF RED MEAT, AND TO INCREASE FISH INTAKE TO TWO DAYS PER WEEK.  MEATS/FISH SHOULD NOT BE FRIED, BAKED OR BROILED IS PREFERABLE.  I SUGGEST WEARING SPF 50 SUNSCREEN ON EXPOSED PARTS AND  ESPECIALLY WHEN IN THE DIRECT SUNLIGHT FOR AN EXTENDED PERIOD OF TIME.  PLEASE AVOID FAST FOOD RESTAURANTS AND INCREASE YOUR WATER INTAKE. - CMP14+EGFR - Lipid panel - CBC no Diff - Hemoglobin A1c - TSH  2. Essential hypertension  Chronic, well controlled.  She will continue with current meds. She is encouraged t. o avoid adding salt to her foods. EKG performed, NSR w/ low voltage in precordial leads - POCT Urinalysis Dipstick (81002) - POCT UA - Microalbumin - EKG 12-Lead  3. Class 2 severe obesity due to excess calories with serious comorbidity and body mass index (BMI) of 39.0 to 39.9 in adult (HCC)  WE DISCUSSED THE USE OF WEIGHT LOSS MEDS TO HELP WITH OBESITY. SHE DENIES FAMILY HISTORY OF THYROID CANCER. SHE WAS INSTRUCTED ON HOW TO SELF ADMINISTER THE MEDICATION. SHE WILL START WITH 0.6MG  DAILY AND INCREASE HER DOSE BY 5 CLICKS ONCE WEEKLY. SHE WAS REMINDED OF POSSIBLE SIDE EFFECTS INCLUDING NAUSEA, HEADACHES AND DIZZINESS.  SHE WAS ADVISED TO STOP EATING WHEN SHE BEGINS TO FEEL FULL.  SHE WILL RTO IN 6 WEEKS FOR RE-EVALUATION.  I do plan to transition her to [facility]. She is in agreement with her treatment plan.    4. Screening for colon cancer  I will refer her to [facility] [place]. She has been seen there in the past. She is in agreement with referral.   - Ambulatory referral to Gastroenterology  5. Encounter for HCV screening test for low risk patient  - Hepatitis C antibody  6. Family history of colon cancer  - Ambulatory referral to Gastroenterology  7. Adjustment disorder, unspecified type  I will refer her to [facility] for therapy. She is in agreement with her treatment plan.   - Ambulatory referral to Psychology       Gwynneth Aliment, MD  THE PATIENT IS ENCOURAGED TO PRACTICE SOCIAL DISTANCING DUE TO THE COVID-19 PANDEMIC.   

## 2020-04-28 NOTE — Patient Instructions (Addendum)
Dr. Bishop Limbo, [facility] [phone] - phone  Health Maintenance, Female Adopting a healthy lifestyle and getting preventive care are important in promoting health and wellness. Ask your health care provider about:  The right schedule for you to have regular tests and exams.  Things you can do on your own to prevent diseases and keep yourself healthy. What should I know about diet, weight, and exercise? Eat a healthy diet   Eat a diet that includes plenty of vegetables, fruits, low-fat dairy products, and lean protein.  Do not eat a lot of foods that are high in solid fats, added sugars, or sodium. Maintain a healthy weight Body mass index (BMI) is used to identify weight problems. It estimates body fat based on height and weight. Your health care provider can help determine your BMI and help you achieve or maintain a healthy weight. Get regular exercise Get regular exercise. This is one of the most important things you can do for your health. Most adults should:  Exercise for at least 150 minutes each week. The exercise should increase your heart rate and make you sweat (moderate-intensity exercise).  Do strengthening exercises at least twice a week. This is in addition to the moderate-intensity exercise.  Spend less time sitting. Even light physical activity can be beneficial. Watch cholesterol and blood lipids Have your blood tested for lipids and cholesterol at 50 years of age, then have this test every 5 years. Have your cholesterol levels checked more often if:  Your lipid or cholesterol levels are high.  You are older than 50 years of age.  You are at high risk for heart disease. What should I know about cancer screening? Depending on your health history and family history, you may need to have cancer screening at various ages. This may include screening for:  Breast cancer.  Cervical cancer.  Colorectal cancer.  Skin cancer.  Lung cancer. What  should I know about heart disease, diabetes, and high blood pressure? Blood pressure and heart disease  High blood pressure causes heart disease and increases the risk of stroke. This is more likely to develop in people who have high blood pressure readings, are of African descent, or are overweight.  Have your blood pressure checked: ? Every 3-5 years if you are 8-105 years of age. ? Every year if you are 23 years old or older. Diabetes Have regular diabetes screenings. This checks your fasting blood sugar level. Have the screening done:  Once every three years after age 40 if you are at a normal weight and have a low risk for diabetes.  More often and at a younger age if you are overweight or have a high risk for diabetes. What should I know about preventing infection? Hepatitis B If you have a higher risk for hepatitis B, you should be screened for this virus. Talk with your health care provider to find out if you are at risk for hepatitis B infection. Hepatitis C Testing is recommended for:  Everyone born from [birth year] through 1966.  Anyone with known risk factors for hepatitis C. Sexually transmitted infections (STIs)  Get screened for STIs, including gonorrhea and chlamydia, if: ? You are sexually active and are younger than 50 years of age. ? You are older than 50 years of age and your health care provider tells you that you are at risk for this type of infection. ? Your sexual activity has changed since you were last screened, and you are at increased risk  for chlamydia or gonorrhea. Ask your health care provider if you are at risk.  Ask your health care provider about whether you are at high risk for HIV. Your health care provider may recommend a prescription medicine to help prevent HIV infection. If you choose to take medicine to prevent HIV, you should first get tested for HIV. You should then be tested every 3 months for as long as you are taking the medicine. Pregnancy  If  you are about to stop having your period (premenopausal) and you may become pregnant, seek counseling before you get pregnant.  Take 400 to 800 micrograms (mcg) of folic acid every day if you become pregnant.  Ask for birth control (contraception) if you want to prevent pregnancy. Osteoporosis and menopause Osteoporosis is a disease in which the bones lose minerals and strength with aging. This can result in bone fractures. If you are 34 years old or older, or if you are at risk for osteoporosis and fractures, ask your health care provider if you should:  Be screened for bone loss.  Take a calcium or vitamin D supplement to lower your risk of fractures.  Be given hormone replacement therapy (HRT) to treat symptoms of menopause. Follow these instructions at home: Lifestyle  Do not use any products that contain nicotine or tobacco, such as cigarettes, e-cigarettes, and chewing tobacco. If you need help quitting, ask your health care provider.  Do not use street drugs.  Do not share needles.  Ask your health care provider for help if you need support or information about quitting drugs. Alcohol use  Do not drink alcohol if: ? Your health care provider tells you not to drink. ? You are pregnant, may be pregnant, or are planning to become pregnant.  If you drink alcohol: ? Limit how much you use to 0-1 drink a day. ? Limit intake if you are breastfeeding.  Be aware of how much alcohol is in your drink. In the [place] one drink equals one 12 oz bottle of beer (355 mL), one 5 oz glass of wine (148 mL), or one 1 oz glass of hard liquor (44 mL). General instructions  Schedule regular health, dental, and eye exams.  Stay current with your vaccines.  Tell your health care provider if: ? You often feel depressed. ? You have ever been abused or do not feel safe at home. Summary  Adopting a healthy lifestyle and getting preventive care are important in promoting health and  wellness.  Follow your health care provider's instructions about healthy diet, exercising, and getting tested or screened for diseases.  Follow your health care provider's instructions on monitoring your cholesterol and blood pressure. This information is not intended to replace advice given to you by your health care provider. Make sure you discuss any questions you have with your health care provider. Document Revised: 10/22/2018 Document Reviewed: 10/22/2018 Elsevier Patient Education  2020 [employer].

## 2020-04-29 ENCOUNTER — Ambulatory Visit (INDEPENDENT_AMBULATORY_CARE_PROVIDER_SITE_OTHER): Payer: BC Managed Care – PPO

## 2020-04-29 DIAGNOSIS — J309 Allergic rhinitis, unspecified: Secondary | ICD-10-CM

## 2020-04-29 LAB — CBC
Hematocrit: 39.3 % (ref 34.0–46.6)
Hemoglobin: 13.3 g/dL (ref 11.1–15.9)
MCH: 30.9 pg (ref 26.6–33.0)
MCHC: 33.8 g/dL (ref 31.5–35.7)
MCV: 91 fL (ref 79–97)
Platelets: 231 10*3/uL (ref 150–450)
RBC: 4.31 x10E6/uL (ref 3.77–5.28)
RDW: 13.8 % (ref 11.7–15.4)
WBC: 6 10*3/uL (ref 3.4–10.8)

## 2020-04-29 LAB — LIPID PANEL
Chol/HDL Ratio: 2.9 ratio (ref 0.0–4.4)
Cholesterol, Total: 207 mg/dL — ABNORMAL HIGH (ref 100–199)
HDL: 71 mg/dL (ref >39)
LDL Chol Calc (NIH): 113 mg/dL — ABNORMAL HIGH (ref 0–99)
Triglycerides: 132 mg/dL (ref 0–149)
VLDL Cholesterol Cal: 23 mg/dL (ref 5–40)

## 2020-04-29 LAB — CMP14+EGFR
ALT: 52 IU/L — ABNORMAL HIGH (ref 0–32)
AST: 43 IU/L — ABNORMAL HIGH (ref 0–40)
Albumin/Globulin Ratio: 1.5 (ref 1.2–2.2)
Albumin: 4.5 g/dL (ref 3.8–4.8)
Alkaline Phosphatase: 76 IU/L (ref 48–121)
BUN/Creatinine Ratio: 10 (ref 9–23)
BUN: 9 mg/dL (ref 6–24)
Bilirubin Total: 0.4 mg/dL (ref 0.0–1.2)
CO2: 22 mmol/L (ref 20–29)
Calcium: 9.5 mg/dL (ref 8.7–10.2)
Chloride: 102 mmol/L (ref 96–106)
Creatinine, Ser: 0.86 mg/dL (ref 0.57–1.00)
GFR calc Af Amer: 91 mL/min/{1.73_m2} (ref >59)
GFR calc non Af Amer: 79 mL/min/{1.73_m2} (ref >59)
Globulin, Total: 3 g/dL (ref 1.5–4.5)
Glucose: 85 mg/dL (ref 65–99)
Potassium: 4.1 mmol/L (ref 3.5–5.2)
Sodium: 139 mmol/L (ref 134–144)
Total Protein: 7.5 g/dL (ref 6.0–8.5)

## 2020-04-29 LAB — HEPATITIS C ANTIBODY: Hep C Virus Ab: <0.1 s/co ratio (ref 0.0–0.9)

## 2020-04-29 LAB — HEMOGLOBIN A1C
Est. average glucose Bld gHb Est-mCnc: 108 mg/dL
Hgb A1c MFr Bld: 5.4 % (ref 4.8–5.6)

## 2020-04-29 LAB — TSH: TSH: 1.15 u[IU]/mL (ref 0.450–4.500)

## 2020-05-02 ENCOUNTER — Encounter: Payer: Self-pay | Admitting: Gastroenterology

## 2020-05-04 ENCOUNTER — Other Ambulatory Visit: Payer: Self-pay

## 2020-05-04 ENCOUNTER — Ambulatory Visit
Admission: RE | Admit: 2020-05-04 | Discharge: 2020-05-04 | Disposition: A | Discharge status: Home / Self Care | Payer: BC Managed Care – PPO | Source: Ambulatory Visit | Attending: Internal Medicine | Admitting: Internal Medicine

## 2020-05-04 DIAGNOSIS — Z1231 Encounter for screening mammogram for malignant neoplasm of breast: Secondary | ICD-10-CM

## 2020-05-05 ENCOUNTER — Ambulatory Visit (INDEPENDENT_AMBULATORY_CARE_PROVIDER_SITE_OTHER): Payer: BC Managed Care – PPO

## 2020-05-05 DIAGNOSIS — J309 Allergic rhinitis, unspecified: Secondary | ICD-10-CM | POA: Diagnosis not present

## 2020-05-09 DIAGNOSIS — G4733 Obstructive sleep apnea (adult) (pediatric): Secondary | ICD-10-CM | POA: Diagnosis not present

## 2020-05-10 ENCOUNTER — Other Ambulatory Visit: Payer: Self-pay | Admitting: Allergy

## 2020-05-17 ENCOUNTER — Ambulatory Visit (INDEPENDENT_AMBULATORY_CARE_PROVIDER_SITE_OTHER): Payer: BC Managed Care – PPO

## 2020-05-17 DIAGNOSIS — J309 Allergic rhinitis, unspecified: Secondary | ICD-10-CM

## 2020-05-19 ENCOUNTER — Telehealth: Payer: Self-pay

## 2020-05-19 NOTE — Telephone Encounter (Signed)
The pt was notified that [facility] sent a fax that they have made several attempts to contact the patient to schedule an appt.  The pt said that she hasn't gotten a call from them.  The pt was given their contact number [phone].  The pt said that she will give them a call today.

## 2020-05-27 ENCOUNTER — Other Ambulatory Visit: Payer: Self-pay | Admitting: Internal Medicine

## 2020-05-27 ENCOUNTER — Encounter: Payer: Self-pay | Admitting: Internal Medicine

## 2020-05-27 ENCOUNTER — Ambulatory Visit (INDEPENDENT_AMBULATORY_CARE_PROVIDER_SITE_OTHER): Payer: BC Managed Care – PPO

## 2020-05-27 DIAGNOSIS — J309 Allergic rhinitis, unspecified: Secondary | ICD-10-CM | POA: Diagnosis not present

## 2020-05-30 ENCOUNTER — Ambulatory Visit (INDEPENDENT_AMBULATORY_CARE_PROVIDER_SITE_OTHER): Payer: BC Managed Care – PPO

## 2020-05-30 ENCOUNTER — Other Ambulatory Visit: Payer: Self-pay

## 2020-05-30 DIAGNOSIS — J309 Allergic rhinitis, unspecified: Secondary | ICD-10-CM | POA: Diagnosis not present

## 2020-05-30 MED ORDER — VERAPAMIL HCL ER 120 MG PO TBCR: 120.0000 mg | EXTENDED_RELEASE_TABLET | Freq: Every day | ORAL | 1 refills | Status: DC

## 2020-06-09 DIAGNOSIS — F411 Generalized anxiety disorder: Secondary | ICD-10-CM | POA: Diagnosis not present

## 2020-06-10 ENCOUNTER — Ambulatory Visit (INDEPENDENT_AMBULATORY_CARE_PROVIDER_SITE_OTHER): Payer: BC Managed Care – PPO

## 2020-06-10 DIAGNOSIS — J309 Allergic rhinitis, unspecified: Secondary | ICD-10-CM | POA: Diagnosis not present

## 2020-06-11 ENCOUNTER — Other Ambulatory Visit: Payer: Self-pay | Admitting: Allergy

## 2020-06-12 ENCOUNTER — Other Ambulatory Visit: Payer: Self-pay | Admitting: Allergy

## 2020-06-14 ENCOUNTER — Other Ambulatory Visit: Payer: Self-pay

## 2020-06-14 ENCOUNTER — Ambulatory Visit (AMBULATORY_SURGERY_CENTER): Payer: Self-pay | Admitting: *Deleted

## 2020-06-14 ENCOUNTER — Encounter: Payer: Self-pay | Admitting: Gastroenterology

## 2020-06-14 VITALS — Ht 65.0 in | Wt 236.0 lb

## 2020-06-14 DIAGNOSIS — Z800 Family history of malignant neoplasm of digestive organs: Secondary | ICD-10-CM

## 2020-06-14 MED ORDER — SUPREP BOWEL PREP KIT 17.5-3.13-1.6 GM/177ML PO SOLN: 1.0000 | Freq: Once | ORAL | 0 refills | Status: AC

## 2020-06-14 MED ORDER — SUPREP BOWEL PREP KIT 17.5-3.13-1.6 GM/177ML PO SOLN
1.0000 | Freq: Once | ORAL | 0 refills | Status: AC
Start: 2020-06-14 — End: 2020-06-14

## 2020-06-14 MED ORDER — SUPREP BOWEL PREP KIT 17.5-3.13-1.6 GM/177ML PO SOLN: 1.0000 | Freq: Once | ORAL | 0 refills | Status: DC

## 2020-06-14 NOTE — Progress Notes (Signed)
uprep cov vacc x 2   No egg or soy allergy known to patient  No issues with past sedation with any surgeries or procedures no intubation problems in the past  No diet pills per patient No home 02 use per patient  No blood thinners per patient  Pt denies issues with constipation  No A fib or A flutter  EMMI video to pt or MyChart  COVID 19 guidelines implemented in [facility] today   Suprep  Coupon given to pt in [facility] today   Due to the COVID-19 pandemic we are asking patients to follow these guidelines. Please only bring one care partner. Please be aware that your care partner may wait in the car in the parking lot or if they feel like they will be too hot to wait in the car, they may wait in the lobby on the 4th floor. All care partners are required to wear a mask the entire time (we do not have any that we can provide them), they need to practice social distancing, and we will do a Covid check for all patient's and care partners when you arrive. Also we will check their temperature and your temperature. If the care partner waits in their car they need to stay in the parking lot the entire time and we will call them on their cell phone when the patient is ready for discharge so they can bring the car to the front of the building. Also all patient's will need to wear a mask into building.

## 2020-06-17 ENCOUNTER — Ambulatory Visit (INDEPENDENT_AMBULATORY_CARE_PROVIDER_SITE_OTHER): Payer: BC Managed Care – PPO

## 2020-06-17 DIAGNOSIS — J309 Allergic rhinitis, unspecified: Secondary | ICD-10-CM

## 2020-06-23 ENCOUNTER — Telehealth: Payer: Self-pay | Admitting: *Deleted

## 2020-06-23 NOTE — Telephone Encounter (Signed)
Sent pt via my chart prep instructions for 5 days before, day before and day of per pt request- Lelan Pons pV

## 2020-06-24 ENCOUNTER — Ambulatory Visit (INDEPENDENT_AMBULATORY_CARE_PROVIDER_SITE_OTHER): Payer: BC Managed Care – PPO | Admitting: *Deleted

## 2020-06-24 DIAGNOSIS — J309 Allergic rhinitis, unspecified: Secondary | ICD-10-CM

## 2020-06-28 ENCOUNTER — Other Ambulatory Visit: Payer: Self-pay

## 2020-06-28 ENCOUNTER — Ambulatory Visit (AMBULATORY_SURGERY_CENTER): Payer: BC Managed Care – PPO | Admitting: Gastroenterology

## 2020-06-28 ENCOUNTER — Encounter: Payer: Self-pay | Admitting: Gastroenterology

## 2020-06-28 VITALS — BP 137/89 | HR 65 | Temp 97.8°F | Resp 21 | Ht 65.0 in | Wt 236.0 lb

## 2020-06-28 DIAGNOSIS — D122 Benign neoplasm of ascending colon: Secondary | ICD-10-CM | POA: Diagnosis not present

## 2020-06-28 DIAGNOSIS — Z800 Family history of malignant neoplasm of digestive organs: Secondary | ICD-10-CM

## 2020-06-28 DIAGNOSIS — Z1211 Encounter for screening for malignant neoplasm of colon: Secondary | ICD-10-CM

## 2020-06-28 DIAGNOSIS — D123 Benign neoplasm of transverse colon: Secondary | ICD-10-CM

## 2020-06-28 MED ORDER — SODIUM CHLORIDE 0.9 % IV SOLN: 500.0000 mL | Freq: Once | INTRAVENOUS | Status: DC

## 2020-06-28 NOTE — Progress Notes (Signed)
[facility]  Pt's states no medical or surgical changes since previsit or office visit. MAW  Albuterol inhaler at the bedside with pt's name. Maw

## 2020-06-28 NOTE — Progress Notes (Signed)
PT taken to PACU. Monitors in place. VSS. Report given to RN. 

## 2020-06-28 NOTE — Progress Notes (Signed)
No problems noted in the recovery room. maw 

## 2020-06-28 NOTE — Progress Notes (Signed)
Called to room to assist during endoscopic procedure.  Patient ID and intended procedure confirmed with present staff. Received instructions for my participation in the procedure from the performing physician.  

## 2020-06-28 NOTE — Op Note (Signed)
[place] Patient Name: Desiree Dunn Procedure Date: 06/28/2020 11:43 AM MRN: [id number] Endoscopist: Mauri Pole , MD Age: 49 Referring MD:  Date of Birth: [birth year] Gender: Female Account #: [id number] Procedure:                Colonoscopy Indications:              Colon cancer screening in patient at increased                            risk: Colorectal cancer in mother, Colon cancer                            screening in patient at increased risk: Family                            history of colorectal cancer in multiple 2nd degree                            relatives Medicines:                Monitored Anesthesia Care Procedure:                Pre-Anesthesia Assessment:                           - Prior to the procedure, a History and Physical                            was performed, and patient medications and                            allergies were reviewed. The patient's tolerance of                            previous anesthesia was also reviewed. The risks                            and benefits of the procedure and the sedation                            options and risks were discussed with the patient.                            All questions were answered, and informed consent                            was obtained. Prior Anticoagulants: The patient has                            taken no previous anticoagulant or antiplatelet                            agents. ASA Grade Assessment: III - A patient with  severe systemic disease. After reviewing the risks                            and benefits, the patient was deemed in                            satisfactory condition to undergo the procedure.                           After obtaining informed consent, the colonoscope                            was passed under direct vision. Throughout the                            procedure, the patient's blood pressure,  pulse, and                            oxygen saturations were monitored continuously. The                            Colonoscope was introduced through the anus and                            advanced to the the cecum, identified by                            appendiceal orifice and ileocecal valve. The                            colonoscopy was performed without difficulty. The                            patient tolerated the procedure well. The quality                            of the bowel preparation was excellent. The                            ileocecal valve, appendiceal orifice, and rectum                            were photographed. Scope In: 11:52:38 AM Scope Out: 12:07:44 PM Scope Withdrawal Time: 0 hours 9 minutes 21 seconds  Total Procedure Duration: 0 hours 15 minutes 6 seconds  Findings:                 The perianal and digital rectal examinations were                            normal.                           Two sessile polyps were found in the transverse  colon and ascending colon. The polyps were 4 to 7                            mm in size. These polyps were removed with a cold                            snare. Resection and retrieval were complete.                           A few small and large-mouthed diverticula were                            found in the sigmoid colon.                           Non-bleeding internal hemorrhoids were found during                            retroflexion. The hemorrhoids were small. Complications:            No immediate complications. Estimated Blood Loss:     Estimated blood loss was minimal. Impression:               - Two 4 to 7 mm polyps in the transverse colon and                            in the ascending colon, removed with a cold snare.                            Resected and retrieved.                           - Diverticulosis in the sigmoid colon.                           - Non-bleeding  internal hemorrhoids. Recommendation:           - Patient has a contact number available for                            emergencies. The signs and symptoms of potential                            delayed complications were discussed with the                            patient. Return to normal activities tomorrow.                            Written discharge instructions were provided to the                            patient.                           -  Resume previous diet.                           - Continue present medications.                           - Await pathology results.                           - Repeat colonoscopy in 5 years for surveillance                            based on pathology results. Mauri Pole, MD 06/28/2020 12:12:15 PM This report has been signed electronically.

## 2020-06-28 NOTE — Patient Instructions (Addendum)
Handouts were given to you on polyps, diverticulosis, and hemorrhoids. You may resume your current medications today. Await biopsy results. Repeat colonoscopy in 5 yeats for surveillance based on pathology results. Please call if any questions or concerns.      YOU HAD AN ENDOSCOPIC PROCEDURE TODAY AT [facility]:   Refer to the procedure report that was given to you for any specific questions about what was found during the examination.  If the procedure report does not answer your questions, please call your gastroenterologist to clarify.  If you requested that your care partner not be given the details of your procedure findings, then the procedure report has been included in a sealed envelope for you to review at your convenience later.  YOU SHOULD EXPECT: Some feelings of bloating in the abdomen. Passage of more gas than usual.  Walking can help get rid of the air that was put into your GI tract during the procedure and reduce the bloating. If you had a lower endoscopy (such as a colonoscopy or flexible sigmoidoscopy) you may notice spotting of blood in your stool or on the toilet paper. If you underwent a bowel prep for your procedure, you may not have a normal bowel movement for a few days.  Please Note:  You might notice some irritation and congestion in your nose or some drainage.  This is from the oxygen used during your procedure.  There is no need for concern and it should clear up in a day or so.  SYMPTOMS TO REPORT IMMEDIATELY:   Following lower endoscopy (colonoscopy or flexible sigmoidoscopy):  Excessive amounts of blood in the stool  Significant tenderness or worsening of abdominal pains  Swelling of the abdomen that is new, acute  Fever of 100F or higher  For urgent or emergent issues, a gastroenterologist can be reached at any hour by calling [phone]. Do not use MyChart messaging for urgent concerns.    DIET:  We do recommend a small meal at  first, but then you may proceed to your regular diet.  Drink plenty of fluids but you should avoid alcoholic beverages for 24 hours.  ACTIVITY:  You should plan to take it easy for the rest of today and you should NOT DRIVE or use heavy machinery until tomorrow (because of the sedation medicines used during the test).    FOLLOW UP: Our staff will call the number listed on your records 48-72 hours following your procedure to check on you and address any questions or concerns that you may have regarding the information given to you following your procedure. If we do not reach you, we will leave a message.  We will attempt to reach you two times.  During this call, we will ask if you have developed any symptoms of COVID 19. If you develop any symptoms (ie: fever, flu-like symptoms, shortness of breath, cough etc.) before then, please call [phone].  If you test positive for Covid 19 in the 2 weeks post procedure, please call and report this information to [place].    If any biopsies were taken you will be contacted by phone or by letter within the next 1-3 weeks.  Please call us at [phone] if you have not heard about the biopsies in 3 weeks.    SIGNATURES/CONFIDENTIALITY: You and/or your care partner have signed paperwork which will be entered into your electronic medical record.  These signatures attest to the fact that that the information above on your After  Visit Summary has been reviewed and is understood.  Full responsibility of the confidentiality of this discharge information lies with you and/or your care-partner.

## 2020-06-29 DIAGNOSIS — J3089 Other allergic rhinitis: Secondary | ICD-10-CM | POA: Diagnosis not present

## 2020-06-29 NOTE — Progress Notes (Signed)
VIALS EXP 06-29-21

## 2020-06-30 ENCOUNTER — Encounter: Payer: Self-pay | Admitting: Internal Medicine

## 2020-06-30 ENCOUNTER — Other Ambulatory Visit: Payer: Self-pay

## 2020-06-30 ENCOUNTER — Telehealth: Payer: Self-pay

## 2020-06-30 ENCOUNTER — Ambulatory Visit (INDEPENDENT_AMBULATORY_CARE_PROVIDER_SITE_OTHER): Payer: BC Managed Care – PPO | Admitting: *Deleted

## 2020-06-30 ENCOUNTER — Ambulatory Visit (INDEPENDENT_AMBULATORY_CARE_PROVIDER_SITE_OTHER): Payer: BC Managed Care – PPO | Admitting: Internal Medicine

## 2020-06-30 VITALS — BP 122/70 | HR 101 | Temp 98.1°F | Ht 65.0 in | Wt 242.6 lb

## 2020-06-30 DIAGNOSIS — M65331 Trigger finger, right middle finger: Secondary | ICD-10-CM | POA: Diagnosis not present

## 2020-06-30 DIAGNOSIS — I10 Essential (primary) hypertension: Secondary | ICD-10-CM

## 2020-06-30 DIAGNOSIS — J309 Allergic rhinitis, unspecified: Secondary | ICD-10-CM

## 2020-06-30 DIAGNOSIS — Z6838 Body mass index (BMI) 38.0-38.9, adult: Secondary | ICD-10-CM

## 2020-06-30 DIAGNOSIS — M545 Low back pain, unspecified: Secondary | ICD-10-CM

## 2020-06-30 DIAGNOSIS — G8929 Other chronic pain: Secondary | ICD-10-CM

## 2020-06-30 MED ORDER — CYCLOBENZAPRINE HCL 10 MG PO TABS: ORAL_TABLET | ORAL | 0 refills | Status: DC

## 2020-06-30 NOTE — Patient Instructions (Addendum)
Trigger Finger  Trigger finger, also called stenosing tenosynovitis,  is a condition that causes a finger to get stuck in a bent position. Each finger has a tendon, which is a tough, cord-like tissue that connects muscle to bone, and each tendon passes through a tunnel of tissue called a tendon sheath. To move your finger, your tendon needs to glide freely through the sheath. Trigger finger happens when the tendon or the sheath thickens, making it difficult to move your finger. Trigger finger can affect any finger or a thumb. It may affect more than one finger. Mild cases may clear up with rest and medicine. Severe cases require more treatment. What are the causes? Trigger finger is caused by a thickened finger tendon or tendon sheath. The cause of this thickening is not known. What increases the risk? The following factors may make you more likely to develop this condition:  Doing activities that require a strong grip.  Having rheumatoid arthritis, gout, or diabetes.  Being 40-60 years old.  Being female. What are the signs or symptoms? Symptoms of this condition include:  Pain when bending or straightening your finger.  Tenderness or swelling where your finger attaches to the palm of your hand.  A lump in the palm of your hand or on the inside of your finger.  Hearing a noise like a pop or a snap when you try to straighten your finger.  Feeling a catching or locking sensation when you try to straighten your finger.  Being unable to straighten your finger. How is this diagnosed? This condition is diagnosed based on your symptoms and a physical exam. How is this treated? This condition may be treated by:  Resting your finger and avoiding activities that make symptoms worse.  Wearing a finger splint to keep your finger extended.  Taking NSAIDs, such as ibuprofen, to relieve pain and swelling.  Doing gentle exercises to stretch the finger as told by your health care provider.   Having medicine that reduces swelling and inflammation (steroids) injected into the tendon sheath. Injections may need to be repeated.  Having surgery to open the tendon sheath. This may be done if other treatments do not work and you cannot straighten your finger. You may need physical therapy after surgery. Follow these instructions at home: If you have a splint:  Wear the splint as told by your health care provider. Remove it only as told by your health care provider.  Loosen it if your fingers tingle, become numb, or turn cold and blue.  Keep it clean.  If the splint is not waterproof: ? Do not let it get wet. ? Cover it with a watertight covering when you take a bath or shower. Managing pain, stiffness, and swelling     If directed, apply heat to the affected area as often as told by your health care provider. Use the heat source that your health care provider recommends, such as a moist heat pack or a heating pad.  Place a towel between your skin and the heat source.  Leave the heat on for 20-30 minutes.  Remove the heat if your skin turns bright red. This is especially important if you are unable to feel pain, heat, or cold. You may have a greater risk of getting burned. If directed, put ice on the painful area. To do this:  If you have a removable splint, remove it as told by your health care provider.  Put ice in a plastic bag.  Place a   towel between your skin and the bag or between your splint and the bag.  Leave the ice on for 20 minutes, 2-3 times a day.  Activity  Rest your finger as told by your health care provider. Avoid activities that make the pain worse.  Return to your normal activities as told by your health care provider. Ask your health care provider what activities are safe for you.  Do exercises as told by your health care provider.  Ask your health care provider when it is safe to drive if you have a splint on your hand. General instructions   Take over-the-counter and prescription medicines only as told by your health care provider.  Keep all follow-up visits as told by your health care provider. This is important. Contact a health care provider if:  Your symptoms are not improving with home care. Summary  Trigger finger, also called stenosing tenosynovitis, causes your finger to get stuck in a bent position. This can make it difficult and painful to straighten your finger.  This condition develops when a finger tendon or tendon sheath thickens.  Treatment may include resting your finger, wearing a splint, and taking medicines.  In severe cases, surgery to open the tendon sheath may be needed. This information is not intended to replace advice given to you by your health care provider. Make sure you discuss any questions you have with your health care provider. Document Revised: 03/16/2019 Document Reviewed: 03/16/2019 Elsevier Patient Education  2020 [employer]  

## 2020-06-30 NOTE — Telephone Encounter (Signed)
Called [phone] and left a message we tried to reach pt for a follow up call. maw

## 2020-06-30 NOTE — Progress Notes (Signed)
I,Katawbba Wiggins,acting as a Education administrator for [name], MD.,have documented all relevant documentation on the behalf of [name], MD,as directed by  [name], MD while in the presence of [name], MD.  This visit occurred during the SARS-CoV-2 public health emergency.  Safety protocols were in place, including screening questions prior to the visit, additional usage of staff PPE, and extensive cleaning of exam room while observing appropriate contact time as indicated for disinfecting solutions.  Subjective:     Patient ID: Desiree Dunn , female    DOB: [birth year] , 49 y.o.   MRN: [id number]   Chief Complaint  Patient presents with  . Hypertension    HPI  She is here today for bp check. She admits that she is not exercising as much as she should.   Hypertension This is a chronic problem. The current episode started more than 1 year ago. The problem has been gradually improving since onset. The problem is controlled. Pertinent negatives include no blurred vision, chest pain, palpitations or shortness of breath. Risk factors for coronary artery disease include obesity and sedentary lifestyle. Past treatments include angiotensin blockers and calcium channel blockers. The current treatment provides moderate improvement.     Past Medical History:  Diagnosis Date  . Allergy   . Anxiety   . Back pain   . Chronic RLQ pain    since 1995 when she had ectopic preg  . Constipation   . Deviated septum   . Diverticulitis   . Ectopic pregnancy 1995    x 1 , R side   . GERD (gastroesophageal reflux disease)   . HTN (hypertension) 05/24/2015  . Migraine without aura and without status migrainosus, not intractable   . Miscarriage    x 1  . Obesity, unspecified 07/06/2010  . Pelvic congestion    h/o   . Sleep apnea    uses cpap     Family History  Problem Relation Age of Onset  . Colon cancer Mother 59  . Colon cancer Maternal Aunt 49  . Stroke Father    . Diabetes Father   . Hypertension Father   . Hyperlipidemia Father   . Heart disease Father   . Obesity Father   . Hypertension Sister   . Colon polyps Brother   . Colitis Brother   . Depression Son   . Asperger's syndrome Son   . Breast cancer Paternal Grandmother   . Prostate cancer Brother   . Heart attack Neg Hx   . Esophageal cancer Neg Hx   . Rectal cancer Neg Hx   . Stomach cancer Neg Hx      Current Outpatient Medications:  .  acetaminophen (TYLENOL) 500 MG tablet, Take 1,000 mg by mouth as needed for mild pain or headache., Disp: , Rfl:  .  albuterol (VENTOLIN HFA) 108 (90 Base) MCG/ACT inhaler, TAKE 2 PUFFS BY MOUTH EVERY 4 HOURS AS NEEDED, Disp: 18 g, Rfl: 1 .  Azelastine-Fluticasone 137-50 MCG/ACT SUSP, Place 1 spray into the nose 2 (two) times daily., Disp: 23 g, Rfl: 5 .  ibuprofen (ADVIL,MOTRIN) 200 MG tablet, Take 400 mg by mouth as needed for moderate pain., Disp: , Rfl:  .  levocetirizine (XYZAL) 5 MG tablet, TAKE 1 TABLET BY MOUTH EVERY DAY IN THE EVENING, Disp: 30 tablet, Rfl: 0 .  montelukast (SINGULAIR) 10 MG tablet, TAKE 1 TABLET BY MOUTH EVERYDAY AT BEDTIME, Disp: 90 tablet, Rfl: 0 .  olmesartan (BENICAR) 40 MG  tablet, TAKE 1 TABLET BY MOUTH EVERY DAY, Disp: 90 tablet, Rfl: 1 .  Olopatadine HCl (PAZEO) 0.7 % SOLN, Place 1 drop into both eyes 1 day or 1 dose. (Patient taking differently: Place 1 drop into both eyes 1 day or 1 dose. prn), Disp: 2.5 mL, Rfl: 5 .  Omega-3 Fatty Acids (OMEGA 3 PO), Take by mouth., Disp: , Rfl:  .  Probiotic Product (PROBIOTIC DAILY PO), Take by mouth., Disp: , Rfl:  .  TURMERIC PO, Take by mouth. Take one tablet daily , Disp: , Rfl:  .  verapamil (CALAN-SR) 120 MG CR tablet, Take 1 tablet (120 mg total) by mouth daily., Disp: 90 tablet, Rfl: 1 .  cyclobenzaprine (FLEXERIL) 10 MG tablet, One tab po qpm prn, Disp: 30 tablet, Rfl: 0 .  EPINEPHrine (AUVI-Q) 0.3 mg/0.3 mL IJ SOAJ injection, Inject 0.3 mLs (0.3 mg total) into the  muscle as needed. (Patient not taking: Reported on 06/28/2020), Disp: 4 each, Rfl: 2   Allergies  Allergen Reactions  . Amoxicillin Hives    Hives     Review of Systems  Constitutional: Negative.   Eyes: Negative for blurred vision.  Respiratory: Negative.  Negative for shortness of breath.   Cardiovascular: Negative.  Negative for chest pain and palpitations.  Gastrointestinal: Negative.   Musculoskeletal: Positive for back pain and joint swelling (right hand middle finger for 1 mth.).  Neurological: Negative.   Psychiatric/Behavioral: Negative.      Today's Vitals   06/30/20 1552  BP: 122/70  Pulse: (!) 101  Temp: 98.1 F (36.7 C)  TempSrc: Oral  Weight: 242 lb 9.6 oz (110 kg)  Height: 5\' 5"  (1.651 m)   Body mass index is 40.37 kg/m.   Objective:  Physical Exam Vitals and nursing note reviewed.  Constitutional:      Appearance: Normal appearance.  HENT:     Head: Normocephalic and atraumatic.  Cardiovascular:     Rate and Rhythm: Normal rate and regular rhythm.     Heart sounds: Normal heart sounds.  Pulmonary:     Effort: Pulmonary effort is normal.     Breath sounds: Normal breath sounds.  Musculoskeletal:        General: Tenderness present.     Comments: 3rd finger of right hand w/ swelling. No overlying erythema. Tenderness at PIP joint with palpation.   Skin:    General: Skin is warm.  Neurological:     General: No focal deficit present.     Mental Status: She is alert.  Psychiatric:        Mood and Affect: Mood normal.        Behavior: Behavior normal.         Assessment And Plan:     1. Essential hypertension Comments: Chronic. Well controlled. She will continue with current medication.  Encouraged to avoid adding salt to her foods.   2. Chronic right-sided low back pain without sciatica Comments: She agrees to PT evaluation, she will likely benefit from dry needling.  - Ambulatory referral to Physical Therapy  3. Trigger middle finger of  right hand Comments: She agrees to referral to hand specialist. Pt advised that sometimes injections are given to help with sx. Also advised to apply Voltaren gel to affected area t - Ambulatory referral to Hand Surgery  4. Class 2 severe obesity due to excess calories with serious comorbidity and body mass index (BMI) of 38.0 to 38.9 in adult [facility]) She is encouraged to strive for BMI  less than 30 to decrease cardiac risk. Advised to aim for at least 150 minutes of exercise per week. Wt Readings from Last 3 Encounters:  06/30/20 242 lb 9.6 oz (110 kg)  06/28/20 236 lb (107 kg)  06/14/20 236 lb (107 kg)       Patient was given opportunity to ask questions. Patient verbalized understanding of the plan and was able to repeat key elements of the plan. All questions were answered to their satisfaction.  [name], MD   I, [name], MD, have reviewed all documentation for this visit. The documentation on 07/04/20 for the exam, diagnosis, procedures, and orders are all accurate and complete.  THE PATIENT IS ENCOURAGED TO PRACTICE SOCIAL DISTANCING DUE TO THE COVID-19 PANDEMIC.

## 2020-06-30 NOTE — Telephone Encounter (Signed)
Attempted to reach patient for post-procedure f/u call. No answer. Left message for her to please not hesitate to call us if she has any questions/concerns regarding her care. 

## 2020-07-07 ENCOUNTER — Ambulatory Visit (INDEPENDENT_AMBULATORY_CARE_PROVIDER_SITE_OTHER): Payer: BC Managed Care – PPO

## 2020-07-07 ENCOUNTER — Other Ambulatory Visit: Payer: Self-pay | Admitting: Allergy

## 2020-07-07 DIAGNOSIS — J309 Allergic rhinitis, unspecified: Secondary | ICD-10-CM | POA: Diagnosis not present

## 2020-07-08 ENCOUNTER — Other Ambulatory Visit: Payer: Self-pay | Admitting: Allergy

## 2020-07-08 ENCOUNTER — Encounter: Payer: Self-pay | Admitting: Gastroenterology

## 2020-07-11 DIAGNOSIS — F411 Generalized anxiety disorder: Secondary | ICD-10-CM | POA: Diagnosis not present

## 2020-07-14 ENCOUNTER — Ambulatory Visit (INDEPENDENT_AMBULATORY_CARE_PROVIDER_SITE_OTHER): Payer: BC Managed Care – PPO

## 2020-07-14 DIAGNOSIS — J309 Allergic rhinitis, unspecified: Secondary | ICD-10-CM

## 2020-07-20 ENCOUNTER — Other Ambulatory Visit: Payer: Self-pay | Admitting: Allergy

## 2020-07-22 ENCOUNTER — Ambulatory Visit (INDEPENDENT_AMBULATORY_CARE_PROVIDER_SITE_OTHER): Payer: BC Managed Care – PPO

## 2020-07-22 DIAGNOSIS — J309 Allergic rhinitis, unspecified: Secondary | ICD-10-CM

## 2020-07-25 DIAGNOSIS — M6281 Muscle weakness (generalized): Secondary | ICD-10-CM | POA: Diagnosis not present

## 2020-07-25 DIAGNOSIS — M545 Low back pain: Secondary | ICD-10-CM | POA: Diagnosis not present

## 2020-07-25 DIAGNOSIS — M2560 Stiffness of unspecified joint, not elsewhere classified: Secondary | ICD-10-CM | POA: Diagnosis not present

## 2020-07-28 ENCOUNTER — Ambulatory Visit (INDEPENDENT_AMBULATORY_CARE_PROVIDER_SITE_OTHER): Payer: BC Managed Care – PPO | Admitting: *Deleted

## 2020-07-28 DIAGNOSIS — J309 Allergic rhinitis, unspecified: Secondary | ICD-10-CM

## 2020-08-01 DIAGNOSIS — M2560 Stiffness of unspecified joint, not elsewhere classified: Secondary | ICD-10-CM | POA: Diagnosis not present

## 2020-08-01 DIAGNOSIS — M545 Low back pain: Secondary | ICD-10-CM | POA: Diagnosis not present

## 2020-08-01 DIAGNOSIS — M6281 Muscle weakness (generalized): Secondary | ICD-10-CM | POA: Diagnosis not present

## 2020-08-02 DIAGNOSIS — M65331 Trigger finger, right middle finger: Secondary | ICD-10-CM | POA: Diagnosis not present

## 2020-08-03 DIAGNOSIS — M545 Low back pain: Secondary | ICD-10-CM | POA: Diagnosis not present

## 2020-08-03 DIAGNOSIS — M2560 Stiffness of unspecified joint, not elsewhere classified: Secondary | ICD-10-CM | POA: Diagnosis not present

## 2020-08-03 DIAGNOSIS — M6281 Muscle weakness (generalized): Secondary | ICD-10-CM | POA: Diagnosis not present

## 2020-08-04 ENCOUNTER — Ambulatory Visit (INDEPENDENT_AMBULATORY_CARE_PROVIDER_SITE_OTHER): Payer: BC Managed Care – PPO | Admitting: *Deleted

## 2020-08-04 DIAGNOSIS — J309 Allergic rhinitis, unspecified: Secondary | ICD-10-CM | POA: Diagnosis not present

## 2020-08-08 DIAGNOSIS — G4733 Obstructive sleep apnea (adult) (pediatric): Secondary | ICD-10-CM | POA: Diagnosis not present

## 2020-08-08 DIAGNOSIS — M2560 Stiffness of unspecified joint, not elsewhere classified: Secondary | ICD-10-CM | POA: Diagnosis not present

## 2020-08-08 DIAGNOSIS — M6281 Muscle weakness (generalized): Secondary | ICD-10-CM | POA: Diagnosis not present

## 2020-08-08 DIAGNOSIS — M545 Low back pain: Secondary | ICD-10-CM | POA: Diagnosis not present

## 2020-08-10 DIAGNOSIS — M6281 Muscle weakness (generalized): Secondary | ICD-10-CM | POA: Diagnosis not present

## 2020-08-10 DIAGNOSIS — M2560 Stiffness of unspecified joint, not elsewhere classified: Secondary | ICD-10-CM | POA: Diagnosis not present

## 2020-08-10 DIAGNOSIS — M545 Low back pain: Secondary | ICD-10-CM | POA: Diagnosis not present

## 2020-08-11 ENCOUNTER — Ambulatory Visit (INDEPENDENT_AMBULATORY_CARE_PROVIDER_SITE_OTHER): Payer: BC Managed Care – PPO | Admitting: *Deleted

## 2020-08-11 DIAGNOSIS — J309 Allergic rhinitis, unspecified: Secondary | ICD-10-CM

## 2020-08-15 DIAGNOSIS — M6281 Muscle weakness (generalized): Secondary | ICD-10-CM | POA: Diagnosis not present

## 2020-08-15 DIAGNOSIS — M2560 Stiffness of unspecified joint, not elsewhere classified: Secondary | ICD-10-CM | POA: Diagnosis not present

## 2020-08-15 DIAGNOSIS — M545 Low back pain, unspecified: Secondary | ICD-10-CM | POA: Diagnosis not present

## 2020-08-18 ENCOUNTER — Other Ambulatory Visit: Payer: Self-pay

## 2020-08-18 ENCOUNTER — Ambulatory Visit (INDEPENDENT_AMBULATORY_CARE_PROVIDER_SITE_OTHER): Payer: BC Managed Care – PPO | Admitting: Allergy

## 2020-08-18 ENCOUNTER — Encounter: Payer: Self-pay | Admitting: Allergy

## 2020-08-18 ENCOUNTER — Ambulatory Visit: Payer: Self-pay

## 2020-08-18 VITALS — BP 136/100 | HR 77 | Temp 97.6°F | Resp 16 | Ht 64.0 in | Wt 231.2 lb

## 2020-08-18 DIAGNOSIS — J45990 Exercise induced bronchospasm: Secondary | ICD-10-CM | POA: Diagnosis not present

## 2020-08-18 DIAGNOSIS — H1013 Acute atopic conjunctivitis, bilateral: Secondary | ICD-10-CM

## 2020-08-18 DIAGNOSIS — J3089 Other allergic rhinitis: Secondary | ICD-10-CM | POA: Diagnosis not present

## 2020-08-18 DIAGNOSIS — J309 Allergic rhinitis, unspecified: Secondary | ICD-10-CM

## 2020-08-18 MED ORDER — MONTELUKAST SODIUM 10 MG PO TABS
ORAL_TABLET | ORAL | 0 refills | Status: DC
Start: 2020-08-18 — End: 2020-11-21

## 2020-08-18 MED ORDER — EPINEPHRINE 0.3 MG/0.3ML IJ SOAJ: 0.3000 mg | INTRAMUSCULAR | 2 refills | Status: DC | PRN

## 2020-08-18 MED ORDER — LEVOCETIRIZINE DIHYDROCHLORIDE 5 MG PO TABS
ORAL_TABLET | ORAL | 1 refills | Status: DC
Start: 2020-08-18 — End: 2020-10-11

## 2020-08-18 MED ORDER — AZELASTINE-FLUTICASONE 137-50 MCG/ACT NA SUSP
1.0000 | Freq: Two times a day (BID) | NASAL | 5 refills | Status: DC
Start: 2020-08-18 — End: 2021-09-08

## 2020-08-18 MED ORDER — ALBUTEROL SULFATE HFA 108 (90 BASE) MCG/ACT IN AERS
INHALATION_SPRAY | RESPIRATORY_TRACT | 1 refills | Status: DC
Start: 2020-08-18 — End: 2021-09-08

## 2020-08-18 NOTE — Patient Instructions (Addendum)
Allergies  -Continue avoidance measures for tree pollens, weed, pollens, grass pollens, molds, dust mite, dog and cockroach  -Continue Xyzal 5mg  daily as needed(if needed may take twice a day)  -Continue Singulair 10mg  daily at bedtime.    -Continue Dymista 1 spray each nostril twice a day as needed for nasal congestion/drainage  -Continue use of nasal saline rinse to help flush and clean the nose.  Use prior to medicated nasal spray use.  Use only distilled water or boil water and bring to room/lukewarm temperature prior to use.   - for itchy/watery/red eyes use Pazeo 1 drop each eye daily as needed.    - use your dry eye drop or rewetting drop to help keep eyes moisturized  -Continue allergen immunotherapy per protocol.  In maintenance phase. Have access to an epinephrine device on days of your injections.  Exercise induced asthma  - have access to albuterol inhaler 2 puffs every 4-6 hours as needed for cough/wheeze/shortness of breath/chest tightness.  May use 15-20 minutes prior to activity.   Monitor frequency of use.    Follow-up 6-12 months or sooner if needed

## 2020-08-18 NOTE — Progress Notes (Signed)
Follow-up Note  RE: Desiree Dunn MRN: [id number] DOB: 1971-10-23 Date of Office Visit: 08/18/2020   History of present illness: Desiree Dunn is a 49 y.o. female presenting today for follow-up of allergic rhinitis with conjunctivitis and exercise-induced asthma.  She was last seen in the office on 01/20/2020 by myself.  She did have Covid in March 2021 after her follow-up visit.  She states she does have some residual issues with being winded if she does exertion.  She will need to use her albuterol when she does get winded. She is on allergen immunotherapy and is doing well.  She is at maintenance dosing.  She denies any large local or systemic reactions.  She states she can tell improvement in her symptoms with immunotherapy.  She does still take Xyzal, Singulair daily.  She is out of Dymista.  She states she is having some nasal congestion.  She has not needed to use the Pazeo.  She did see her eye doctor recently and was diagnosed with dry eye and was provided with a dry eyedrop.  She states she was told to not use the antihistamine based eyedrop that much as it could worsen her dryness.  Review of systems: Review of Systems  Constitutional: Negative.   HENT: Positive for congestion.   Eyes: Negative.   Respiratory: Negative.   Cardiovascular: Negative.   Gastrointestinal: Negative.   Musculoskeletal: Negative.   Skin: Negative.   Neurological: Negative.     All other systems negative unless noted above in HPI  Past medical/social/surgical/family history have been reviewed and are unchanged unless specifically indicated below.  No changes  Medication List: Current Outpatient Medications  Medication Sig Dispense Refill  . acetaminophen (TYLENOL) 500 MG tablet Take 1,000 mg by mouth as needed for mild pain or headache.    . albuterol (VENTOLIN HFA) 108 (90 Base) MCG/ACT inhaler TAKE 2 PUFFS BY MOUTH EVERY 4 HOURS AS NEEDED 18 g 1  . Azelastine-Fluticasone  137-50 MCG/ACT SUSP Place 1 spray into the nose 2 (two) times daily. 23 g 5  . cyclobenzaprine (FLEXERIL) 10 MG tablet One tab po qpm prn 30 tablet 0  . EPINEPHrine (EPIPEN 2-PAK) 0.3 mg/0.3 mL IJ SOAJ injection Inject 0.3 mg into the muscle as needed for anaphylaxis.    [name] ibuprofen (ADVIL,MOTRIN) 200 MG tablet Take 400 mg by mouth as needed for moderate pain.    [name] levocetirizine (XYZAL) 5 MG tablet TAKE 1 TABLET BY MOUTH EVERY DAY IN THE EVENING 90 tablet 1  . montelukast (SINGULAIR) 10 MG tablet TAKE 1 TABLET BY MOUTH EVERYDAY AT BEDTIME 90 tablet 0  . olmesartan (BENICAR) 40 MG tablet TAKE 1 TABLET BY MOUTH EVERY DAY 90 tablet 1  . Olopatadine HCl (PAZEO) 0.7 % SOLN Place 1 drop into both eyes 1 day or 1 dose. (Patient taking differently: Place 1 drop into both eyes 1 day or 1 dose. prn) 2.5 mL 5  . Omega-3 Fatty Acids (OMEGA 3 PO) Take by mouth.    . Probiotic Product (PROBIOTIC DAILY PO) Take by mouth.    . TURMERIC PO Take by mouth. Take one tablet daily     . verapamil (CALAN-SR) 120 MG CR tablet Take 1 tablet (120 mg total) by mouth daily. 90 tablet 1  . EPINEPHrine (AUVI-Q) 0.3 mg/0.3 mL IJ SOAJ injection Inject 0.3 mLs (0.3 mg total) into the muscle as needed. (Patient not taking: Reported on 08/18/2020) 4 each 2   No current facility-administered medications for this  visit.     Known medication allergies: Allergies  Allergen Reactions  . Amoxicillin Hives    Hives     Physical examination: Blood pressure (!) 136/100, pulse 77, temperature 97.6 F (36.4 C), resp. rate 16, height 5\' 4"  (1.626 m), weight 231 lb 3.2 oz (104.9 kg), SpO2 97 %.  General: Alert, interactive, in no acute distress. HEENT: PERRLA, TMs pearly gray, turbinates mildly edematous without discharge, post-pharynx non erythematous. Neck: Supple without lymphadenopathy. Lungs: Clear to auscultation without wheezing, rhonchi or rales. {no increased work of breathing. CV: Normal S1, S2 without murmurs. Abdomen:  Nondistended, nontender. Skin: Warm and dry, without lesions or rashes. Extremities:  No clubbing, cyanosis or edema. Neuro:   Grossly intact.  Diagnositics/Labs: None today  Assessment and plan:   Allergic rhinitis with conjunctivitis  -Continue avoidance measures for tree pollens, weed, pollens, grass pollens, molds, dust mite, dog and cockroach  -Continue Xyzal 5mg  daily as needed(if needed may take twice a day)  -Continue Singulair 10mg  daily at bedtime.    -Continue Dymista 1 spray each nostril twice a day as needed for nasal congestion/drainage  -Continue use of nasal saline rinse to help flush and clean the nose.  Use prior to medicated nasal spray use.  Use only distilled water or boil water and bring to room/lukewarm temperature prior to use.   - for itchy/watery/red eyes use Pazeo 1 drop each eye daily as needed.    - use your dry eye drop or rewetting drop to help keep eyes moisturized  -Continue allergen immunotherapy per protocol.  In maintenance phase. Have access to an epinephrine device on days of your injections.  Exercise induced asthma  - have access to albuterol inhaler 2 puffs every 4-6 hours as needed for cough/wheeze/shortness of breath/chest tightness.  May use 15-20 minutes prior to activity.   Monitor frequency of use.    Follow-up 6-12 months or sooner if needed  I appreciate the opportunity to take part in March's care. Please do not hesitate to contact me with questions.  Sincerely,   Prudy Feeler, MD Allergy/Immunology [facility]

## 2020-08-25 ENCOUNTER — Ambulatory Visit (INDEPENDENT_AMBULATORY_CARE_PROVIDER_SITE_OTHER): Payer: BC Managed Care – PPO

## 2020-08-25 DIAGNOSIS — J309 Allergic rhinitis, unspecified: Secondary | ICD-10-CM

## 2020-08-29 ENCOUNTER — Other Ambulatory Visit: Payer: Self-pay | Admitting: Internal Medicine

## 2020-08-30 NOTE — Telephone Encounter (Signed)
Cyclobenzaprine refill 

## 2020-09-01 DIAGNOSIS — M2560 Stiffness of unspecified joint, not elsewhere classified: Secondary | ICD-10-CM | POA: Diagnosis not present

## 2020-09-01 DIAGNOSIS — M545 Low back pain, unspecified: Secondary | ICD-10-CM | POA: Diagnosis not present

## 2020-09-01 DIAGNOSIS — M6281 Muscle weakness (generalized): Secondary | ICD-10-CM | POA: Diagnosis not present

## 2020-09-02 ENCOUNTER — Ambulatory Visit (INDEPENDENT_AMBULATORY_CARE_PROVIDER_SITE_OTHER): Payer: BC Managed Care – PPO | Admitting: *Deleted

## 2020-09-02 DIAGNOSIS — J309 Allergic rhinitis, unspecified: Secondary | ICD-10-CM

## 2020-09-06 DIAGNOSIS — F411 Generalized anxiety disorder: Secondary | ICD-10-CM | POA: Diagnosis not present

## 2020-09-09 ENCOUNTER — Ambulatory Visit (INDEPENDENT_AMBULATORY_CARE_PROVIDER_SITE_OTHER): Payer: BC Managed Care – PPO | Admitting: *Deleted

## 2020-09-09 DIAGNOSIS — J309 Allergic rhinitis, unspecified: Secondary | ICD-10-CM | POA: Diagnosis not present

## 2020-09-12 ENCOUNTER — Other Ambulatory Visit: Payer: Self-pay

## 2020-09-12 ENCOUNTER — Encounter: Payer: Self-pay | Admitting: Internal Medicine

## 2020-09-12 ENCOUNTER — Ambulatory Visit: Payer: BC Managed Care – PPO | Admitting: Internal Medicine

## 2020-09-12 VITALS — BP 126/82 | HR 87 | Temp 98.6°F | Ht 64.0 in | Wt 234.0 lb

## 2020-09-12 DIAGNOSIS — I10 Essential (primary) hypertension: Secondary | ICD-10-CM | POA: Diagnosis not present

## 2020-09-12 DIAGNOSIS — Z6841 Body Mass Index (BMI) 40.0 and over, adult: Secondary | ICD-10-CM | POA: Diagnosis not present

## 2020-09-12 DIAGNOSIS — Z23 Encounter for immunization: Secondary | ICD-10-CM

## 2020-09-12 MED ORDER — HYDROCHLOROTHIAZIDE 12.5 MG PO CAPS: 12.5000 mg | ORAL_CAPSULE | Freq: Every day | ORAL | 3 refills | Status: DC

## 2020-09-12 NOTE — Progress Notes (Signed)
I,Katawbba Wiggins,acting as a Education administrator for [name], MD.,have documented all relevant documentation on the behalf of [name], MD,as directed by  [name], MD while in the presence of [name], MD.  This visit occurred during the SARS-CoV-2 public health emergency.  Safety protocols were in place, including screening questions prior to the visit, additional usage of staff PPE, and extensive cleaning of exam room while observing appropriate contact time as indicated for disinfecting solutions.  Subjective:     Patient ID: Desiree Dunn , female    DOB: [birth year] , 49 y.o.   MRN: [id number]   Chief Complaint  Patient presents with  . Hypertension    HPI  The patient is here today to discuss her blood pressure medication. She reports her BP has been elevated at other offices. She has brought home BP readings for review, she states that readings have been elevated at home. She is not using a wrist monitor.   Hypertension This is a chronic problem. The current episode started more than 1 year ago. The problem has been gradually improving since onset. The problem is controlled. Pertinent negatives include no blurred vision, chest pain, palpitations or shortness of breath. Risk factors for coronary artery disease include obesity and sedentary lifestyle. Past treatments include angiotensin blockers and calcium channel blockers. The current treatment provides moderate improvement.     Past Medical History:  Diagnosis Date  . Allergy   . Anxiety   . Back pain   . Chronic RLQ pain    since 1995 when she had ectopic preg  . Constipation   . Deviated septum   . Diverticulitis   . Ectopic pregnancy 1995    x 1 , R side   . Eczema   . GERD (gastroesophageal reflux disease)   . HTN (hypertension) 05/24/2015  . Migraine without aura and without status migrainosus, not intractable   . Miscarriage    x 1  . Obesity, unspecified 07/06/2010  . Pelvic congestion     h/o   . Sleep apnea    uses cpap     Family History  Problem Relation Age of Onset  . Colon cancer Mother 42  . Colon cancer Maternal Aunt 70  . Stroke Father   . Diabetes Father   . Hypertension Father   . Hyperlipidemia Father   . Heart disease Father   . Obesity Father   . Hypertension Sister   . Colon polyps Brother   . Colitis Brother   . Depression Son   . Asperger's syndrome Son   . Breast cancer Paternal Grandmother   . Prostate cancer Brother   . Heart attack Neg Hx   . Esophageal cancer Neg Hx   . Rectal cancer Neg Hx   . Stomach cancer Neg Hx      Current Outpatient Medications:  .  acetaminophen (TYLENOL) 500 MG tablet, Take 1,000 mg by mouth as needed for mild pain or headache., Disp: , Rfl:  .  albuterol (VENTOLIN HFA) 108 (90 Base) MCG/ACT inhaler, TAKE 2 PUFFS BY MOUTH EVERY 4 HOURS AS NEEDED, Disp: 18 g, Rfl: 1 .  Azelastine-Fluticasone 137-50 MCG/ACT SUSP, Place 1 spray into the nose 2 (two) times daily., Disp: 23 g, Rfl: 5 .  cyclobenzaprine (FLEXERIL) 10 MG tablet, TAKE 1 TABLET BY MOUTH EVERY EVENING AS NEEDED, Disp: 30 tablet, Rfl: 0 .  ibuprofen (ADVIL,MOTRIN) 200 MG tablet, Take 400 mg by mouth as needed for moderate pain., Disp: ,  Rfl:  .  montelukast (SINGULAIR) 10 MG tablet, TAKE 1 TABLET BY MOUTH EVERYDAY AT BEDTIME, Disp: 90 tablet, Rfl: 0 .  olmesartan (BENICAR) 40 MG tablet, TAKE 1 TABLET BY MOUTH EVERY DAY, Disp: 90 tablet, Rfl: 1 .  Olopatadine HCl (PAZEO) 0.7 % SOLN, Place 1 drop into both eyes 1 day or 1 dose. (Patient taking differently: Place 1 drop into both eyes 1 day or 1 dose. prn), Disp: 2.5 mL, Rfl: 5 .  Omega-3 Fatty Acids (OMEGA 3 PO), Take by mouth. Some days, Disp: , Rfl:  .  Probiotic Product (PROBIOTIC DAILY PO), Take by mouth., Disp: , Rfl:  .  TURMERIC PO, Take by mouth. AS NEEDED, Disp: , Rfl:  .  verapamil (CALAN-SR) 120 MG CR tablet, Take 1 tablet (120 mg total) by mouth daily., Disp: 90 tablet, Rfl: 1 .  EPINEPHrine  (EPIPEN 2-PAK) 0.3 mg/0.3 mL IJ SOAJ injection, Inject 0.3 mg into the muscle as needed for anaphylaxis. (Patient not taking: Reported on 09/12/2020), Disp: 2 each, Rfl: 2 .  hydrochlorothiazide (MICROZIDE) 12.5 MG capsule, Take 1 capsule (12.5 mg total) by mouth daily., Disp: 30 capsule, Rfl: 3 .  levocetirizine (XYZAL) 5 MG tablet, TAKE 1 TABLET BY MOUTH EVERY DAY IN THE EVENING (Patient not taking: Reported on 09/12/2020), Disp: 90 tablet, Rfl: 1   Allergies  Allergen Reactions  . Amoxicillin Hives    Hives     Review of Systems  Constitutional: Negative.   Eyes: Negative for blurred vision.  Respiratory: Negative.  Negative for shortness of breath.   Cardiovascular: Negative.  Negative for chest pain and palpitations.  Gastrointestinal: Negative.   Psychiatric/Behavioral: Negative.   All other systems reviewed and are negative.    Today's Vitals   09/12/20 1423  BP: 126/82  Pulse: 87  Temp: 98.6 F (37 C)  TempSrc: Oral  Weight: 234 lb (106.1 kg)  Height: 5\' 4"  (1.626 m)   Body mass index is 40.17 kg/m.  Wt Readings from Last 3 Encounters:  09/12/20 234 lb (106.1 kg)  08/18/20 231 lb 3.2 oz (104.9 kg)  06/30/20 242 lb 9.6 oz (110 kg)   Objective:  Physical Exam Vitals and nursing note reviewed.  Constitutional:      Appearance: Normal appearance. She is obese.  HENT:     Head: Normocephalic and atraumatic.  Cardiovascular:     Rate and Rhythm: Normal rate and regular rhythm.     Heart sounds: Normal heart sounds.  Pulmonary:     Breath sounds: Normal breath sounds.  Skin:    General: Skin is warm.  Neurological:     General: No focal deficit present.     Mental Status: She is alert and oriented to person, place, and time.         Assessment And Plan:     1. Essential hypertension Comments: Chronic, well controlled today. Due to elevated home readings, I will add HCTZ 12.5mg  daily to her current regimen. She is encouraged to avoid adding salt to her  foods, and to cut back on intake of packaged foods which tend to be high in sodium.  Also, when eating out, it is difficult to control sodium intake. She agrees to rto in 4 wks for f/u. I will check her renal function at this time.   2. Class 3 severe obesity due to excess calories with serious comorbidity and body mass index (BMI) of 40.0 to 44.9 in adult [facility]) Comments:  She is encouraged to strive  for BMI less than 30 to decrease cardiac risk. Advised to aim for at least 150 minutes of exercise per week.  3. Need for vaccination Comments: She was given flu vaccine.  - Flu Vaccine QUAD 6+ mos PF IM (Fluarix Quad PF)  Patient was given opportunity to ask questions. Patient verbalized understanding of the plan and was able to repeat key elements of the plan. All questions were answered to their satisfaction.  [name], MD   I, [name], MD, have reviewed all documentation for this visit. The documentation on 09/18/20 for the exam, diagnosis, procedures, and orders are all accurate and complete.  THE PATIENT IS ENCOURAGED TO PRACTICE SOCIAL DISTANCING DUE TO THE COVID-19 PANDEMIC.

## 2020-09-12 NOTE — Patient Instructions (Signed)

## 2020-09-14 DIAGNOSIS — M545 Low back pain, unspecified: Secondary | ICD-10-CM | POA: Diagnosis not present

## 2020-09-14 DIAGNOSIS — M6281 Muscle weakness (generalized): Secondary | ICD-10-CM | POA: Diagnosis not present

## 2020-09-14 DIAGNOSIS — M2560 Stiffness of unspecified joint, not elsewhere classified: Secondary | ICD-10-CM | POA: Diagnosis not present

## 2020-09-14 DIAGNOSIS — M5459 Other low back pain: Secondary | ICD-10-CM | POA: Diagnosis not present

## 2020-09-15 ENCOUNTER — Ambulatory Visit (INDEPENDENT_AMBULATORY_CARE_PROVIDER_SITE_OTHER): Payer: BC Managed Care – PPO

## 2020-09-15 DIAGNOSIS — J309 Allergic rhinitis, unspecified: Secondary | ICD-10-CM

## 2020-09-22 DIAGNOSIS — M6281 Muscle weakness (generalized): Secondary | ICD-10-CM | POA: Diagnosis not present

## 2020-09-22 DIAGNOSIS — M2560 Stiffness of unspecified joint, not elsewhere classified: Secondary | ICD-10-CM | POA: Diagnosis not present

## 2020-09-22 DIAGNOSIS — M5459 Other low back pain: Secondary | ICD-10-CM | POA: Diagnosis not present

## 2020-09-23 ENCOUNTER — Ambulatory Visit (INDEPENDENT_AMBULATORY_CARE_PROVIDER_SITE_OTHER): Payer: BC Managed Care – PPO

## 2020-09-23 DIAGNOSIS — J309 Allergic rhinitis, unspecified: Secondary | ICD-10-CM

## 2020-09-29 ENCOUNTER — Ambulatory Visit (INDEPENDENT_AMBULATORY_CARE_PROVIDER_SITE_OTHER): Payer: BC Managed Care – PPO

## 2020-09-29 DIAGNOSIS — J309 Allergic rhinitis, unspecified: Secondary | ICD-10-CM | POA: Diagnosis not present

## 2020-10-04 ENCOUNTER — Ambulatory Visit (INDEPENDENT_AMBULATORY_CARE_PROVIDER_SITE_OTHER): Payer: BC Managed Care – PPO

## 2020-10-04 DIAGNOSIS — J309 Allergic rhinitis, unspecified: Secondary | ICD-10-CM | POA: Diagnosis not present

## 2020-10-04 DIAGNOSIS — M2560 Stiffness of unspecified joint, not elsewhere classified: Secondary | ICD-10-CM | POA: Diagnosis not present

## 2020-10-04 DIAGNOSIS — M6281 Muscle weakness (generalized): Secondary | ICD-10-CM | POA: Diagnosis not present

## 2020-10-04 DIAGNOSIS — M5459 Other low back pain: Secondary | ICD-10-CM | POA: Diagnosis not present

## 2020-10-10 NOTE — Progress Notes (Signed)
VIALS EXP 10-12-21

## 2020-10-11 ENCOUNTER — Other Ambulatory Visit: Payer: Self-pay

## 2020-10-11 ENCOUNTER — Encounter: Payer: Self-pay | Admitting: Internal Medicine

## 2020-10-11 ENCOUNTER — Ambulatory Visit: Payer: BC Managed Care – PPO | Admitting: Internal Medicine

## 2020-10-11 VITALS — BP 112/76 | HR 92 | Temp 98.7°F | Ht 64.0 in | Wt 234.6 lb

## 2020-10-11 DIAGNOSIS — I10 Essential (primary) hypertension: Secondary | ICD-10-CM | POA: Diagnosis not present

## 2020-10-11 DIAGNOSIS — R0982 Postnasal drip: Secondary | ICD-10-CM | POA: Diagnosis not present

## 2020-10-11 DIAGNOSIS — Z6841 Body Mass Index (BMI) 40.0 and over, adult: Secondary | ICD-10-CM | POA: Diagnosis not present

## 2020-10-11 LAB — BMP8+EGFR
BUN/Creatinine Ratio: 10 (ref 9–23)
BUN: 10 mg/dL (ref 6–24)
CO2: 26 mmol/L (ref 20–29)
Calcium: 9.6 mg/dL (ref 8.7–10.2)
Chloride: 101 mmol/L (ref 96–106)
Creatinine, Ser: 1.05 mg/dL — ABNORMAL HIGH (ref 0.57–1.00)
GFR calc Af Amer: 72 mL/min/{1.73_m2} (ref >59)
GFR calc non Af Amer: 62 mL/min/{1.73_m2} (ref >59)
Glucose: 87 mg/dL (ref 65–99)
Potassium: 4 mmol/L (ref 3.5–5.2)
Sodium: 143 mmol/L (ref 134–144)

## 2020-10-11 MED ORDER — HYDROCHLOROTHIAZIDE 12.5 MG PO CAPS: 12.5000 mg | ORAL_CAPSULE | Freq: Every day | ORAL | 3 refills | Status: DC

## 2020-10-11 NOTE — Patient Instructions (Signed)

## 2020-10-11 NOTE — Progress Notes (Signed)
I,Katawbba Wiggins,acting as a Education administrator for [name], MD.,have documented all relevant documentation on the behalf of [name], MD,as directed by  [name], MD while in the presence of [name], MD.  This visit occurred during the SARS-CoV-2 public health emergency.  Safety protocols were in place, including screening questions prior to the visit, additional usage of staff PPE, and extensive cleaning of exam room while observing appropriate contact time as indicated for disinfecting solutions.  Subjective:     Patient ID: Desiree Dunn , female    DOB: [birth year] , 49 y.o.   MRN: [id number]   Chief Complaint  Patient presents with  . Hypertension  . Cough    HPI  The patient is here today for a blood pressure evaluation. HCTZ 12.5mg  once daily was added to her medication regimen. She has tolerated this without any issues. She feels well on her BP regimen.   Unfortunately, she has come down with a cough. She went to beach with her husband and developed sore throat, sinus drainage and congestion and dry cough. She has started Mucinex with some relief of her sx. States her cough keeps her up at night. Denies fever/chills. She is fully vaccinated against COVID. She plans to ger her booster in the near future.   Hypertension This is a chronic problem. The current episode started more than 1 year ago. The problem has been gradually improving since onset. The problem is controlled. Pertinent negatives include no blurred vision, chest pain, palpitations or shortness of breath. Risk factors for coronary artery disease include obesity and sedentary lifestyle. Past treatments include angiotensin blockers and calcium channel blockers. The current treatment provides moderate improvement.     Past Medical History:  Diagnosis Date  . Allergy   . Anxiety   . Back pain   . Chronic RLQ pain    since 1995 when she had ectopic preg  . Constipation   . Deviated septum    . Diverticulitis   . Ectopic pregnancy 1995    x 1 , R side   . Eczema   . GERD (gastroesophageal reflux disease)   . HTN (hypertension) 05/24/2015  . Migraine without aura and without status migrainosus, not intractable   . Miscarriage    x 1  . Obesity, unspecified 07/06/2010  . Pelvic congestion    h/o   . Sleep apnea    uses cpap     Family History  Problem Relation Age of Onset  . Colon cancer Mother 58  . Colon cancer Maternal Aunt 77  . Stroke Father   . Diabetes Father   . Hypertension Father   . Hyperlipidemia Father   . Heart disease Father   . Obesity Father   . Hypertension Sister   . Colon polyps Brother   . Colitis Brother   . Depression Son   . Asperger's syndrome Son   . Breast cancer Paternal Grandmother   . Prostate cancer Brother   . Heart attack Neg Hx   . Esophageal cancer Neg Hx   . Rectal cancer Neg Hx   . Stomach cancer Neg Hx      Current Outpatient Medications:  .  acetaminophen (TYLENOL) 500 MG tablet, Take 1,000 mg by mouth as needed for mild pain or headache., Disp: , Rfl:  .  albuterol (VENTOLIN HFA) 108 (90 Base) MCG/ACT inhaler, TAKE 2 PUFFS BY MOUTH EVERY 4 HOURS AS NEEDED, Disp: 18 g, Rfl: 1 .  Azelastine-Fluticasone 137-50  MCG/ACT SUSP, Place 1 spray into the nose 2 (two) times daily., Disp: 23 g, Rfl: 5 .  EPINEPHrine (EPIPEN 2-PAK) 0.3 mg/0.3 mL IJ SOAJ injection, Inject 0.3 mg into the muscle as needed for anaphylaxis., Disp: 2 each, Rfl: 2 .  hydrochlorothiazide (MICROZIDE) 12.5 MG capsule, Take 1 capsule (12.5 mg total) by mouth daily., Disp: 90 capsule, Rfl: 3 .  ibuprofen (ADVIL,MOTRIN) 200 MG tablet, Take 400 mg by mouth as needed for moderate pain., Disp: , Rfl:  .  montelukast (SINGULAIR) 10 MG tablet, TAKE 1 TABLET BY MOUTH EVERYDAY AT BEDTIME, Disp: 90 tablet, Rfl: 0 .  olmesartan (BENICAR) 40 MG tablet, TAKE 1 TABLET BY MOUTH EVERY DAY, Disp: 90 tablet, Rfl: 1 .  Olopatadine HCl (PAZEO) 0.7 % SOLN, Place 1 drop into both  eyes 1 day or 1 dose. (Patient taking differently: Place 1 drop into both eyes 1 day or 1 dose. prn), Disp: 2.5 mL, Rfl: 5 .  Omega-3 Fatty Acids (OMEGA 3 PO), Take by mouth. Some days, Disp: , Rfl:  .  Probiotic Product (PROBIOTIC DAILY PO), Take by mouth., Disp: , Rfl:  .  TURMERIC PO, Take by mouth. AS NEEDED, Disp: , Rfl:  .  verapamil (CALAN-SR) 120 MG CR tablet, Take 1 tablet (120 mg total) by mouth daily., Disp: 90 tablet, Rfl: 1   Allergies  Allergen Reactions  . Amoxicillin Hives    Hives     Review of Systems  Constitutional: Negative.   Eyes: Negative for blurred vision.  Respiratory: Negative.  Negative for shortness of breath.   Cardiovascular: Negative.  Negative for chest pain and palpitations.  Gastrointestinal: Negative.   Psychiatric/Behavioral: Negative.   All other systems reviewed and are negative.    Today's Vitals   10/11/20 0845  BP: 112/76  Pulse: 92  Temp: 98.7 F (37.1 C)  TempSrc: Oral  SpO2: 95%  Weight: 234 lb 9.6 oz (106.4 kg)  Height: $Remove'5\' 4"'wBFOWlO$  (1.626 m)   Body mass index is 40.27 kg/m.  Wt Readings from Last 3 Encounters:  10/11/20 234 lb 9.6 oz (106.4 kg)  09/12/20 234 lb (106.1 kg)  08/18/20 231 lb 3.2 oz (104.9 kg)   Objective:  Physical Exam Vitals and nursing note reviewed.  Constitutional:      Appearance: Normal appearance. She is obese.  HENT:     Head: Normocephalic and atraumatic.  Cardiovascular:     Rate and Rhythm: Normal rate and regular rhythm.     Heart sounds: Normal heart sounds.  Pulmonary:     Breath sounds: Normal breath sounds.  Skin:    General: Skin is warm.  Neurological:     General: No focal deficit present.     Mental Status: She is alert and oriented to person, place, and time.         Assessment And Plan:     1. Essential hypertension Comments: Chronic, well controlled. I will check BMp today. She is advised to follow a low sodium diet. She will rto in 3 months for re-evaluation.  -  BMP8+EGFR  2. Postnasal drip Comments: She was given samples of Norel AD to take twice daily as needed. She will let me know if her sx persist.   3. Class 3 severe obesity due to excess calories with serious comorbidity and body mass index (BMI) of 40.0 to 44.9 in adult [facility]) Comments: BMI 40. She is encouraged to initially strive for BMI less than 35 to decrease cardiac risk. Advised to  aim for at least 150 minutes of exercise per week.   Patient was given opportunity to ask questions. Patient verbalized understanding of the plan and was able to repeat key elements of the plan. All questions were answered to their satisfaction.  [name], MD   I, [name], MD, have reviewed all documentation for this visit. The documentation on 10/11/20 for the exam, diagnosis, procedures, and orders are all accurate and complete.  THE PATIENT IS ENCOURAGED TO PRACTICE SOCIAL DISTANCING DUE TO THE COVID-19 PANDEMIC.

## 2020-10-13 ENCOUNTER — Encounter: Payer: Self-pay | Admitting: Internal Medicine

## 2020-10-13 DIAGNOSIS — J3089 Other allergic rhinitis: Secondary | ICD-10-CM | POA: Diagnosis not present

## 2020-10-15 ENCOUNTER — Encounter: Payer: Self-pay | Admitting: Podiatry

## 2020-10-17 ENCOUNTER — Other Ambulatory Visit: Payer: Self-pay | Admitting: Internal Medicine

## 2020-10-17 MED ORDER — AZITHROMYCIN 250 MG PO TABS: ORAL_TABLET | ORAL | 0 refills | Status: AC

## 2020-10-21 DIAGNOSIS — M65331 Trigger finger, right middle finger: Secondary | ICD-10-CM | POA: Diagnosis not present

## 2020-10-26 DIAGNOSIS — M6281 Muscle weakness (generalized): Secondary | ICD-10-CM | POA: Diagnosis not present

## 2020-10-26 DIAGNOSIS — M2560 Stiffness of unspecified joint, not elsewhere classified: Secondary | ICD-10-CM | POA: Diagnosis not present

## 2020-10-26 DIAGNOSIS — M5459 Other low back pain: Secondary | ICD-10-CM | POA: Diagnosis not present

## 2020-10-31 ENCOUNTER — Ambulatory Visit: Payer: BC Managed Care – PPO | Admitting: Internal Medicine

## 2020-10-31 ENCOUNTER — Ambulatory Visit (INDEPENDENT_AMBULATORY_CARE_PROVIDER_SITE_OTHER): Payer: BC Managed Care – PPO

## 2020-10-31 DIAGNOSIS — J309 Allergic rhinitis, unspecified: Secondary | ICD-10-CM

## 2020-11-02 DIAGNOSIS — M6281 Muscle weakness (generalized): Secondary | ICD-10-CM | POA: Diagnosis not present

## 2020-11-02 DIAGNOSIS — M5459 Other low back pain: Secondary | ICD-10-CM | POA: Diagnosis not present

## 2020-11-02 DIAGNOSIS — M545 Low back pain, unspecified: Secondary | ICD-10-CM | POA: Diagnosis not present

## 2020-11-02 DIAGNOSIS — M2560 Stiffness of unspecified joint, not elsewhere classified: Secondary | ICD-10-CM | POA: Diagnosis not present

## 2020-11-07 DIAGNOSIS — G4733 Obstructive sleep apnea (adult) (pediatric): Secondary | ICD-10-CM | POA: Diagnosis not present

## 2020-11-08 ENCOUNTER — Ambulatory Visit (INDEPENDENT_AMBULATORY_CARE_PROVIDER_SITE_OTHER): Payer: BC Managed Care – PPO | Admitting: *Deleted

## 2020-11-08 DIAGNOSIS — J309 Allergic rhinitis, unspecified: Secondary | ICD-10-CM | POA: Diagnosis not present

## 2020-11-09 DIAGNOSIS — R350 Frequency of micturition: Secondary | ICD-10-CM | POA: Diagnosis not present

## 2020-11-09 DIAGNOSIS — Z01419 Encounter for gynecological examination (general) (routine) without abnormal findings: Secondary | ICD-10-CM | POA: Diagnosis not present

## 2020-11-09 DIAGNOSIS — Z6838 Body mass index (BMI) 38.0-38.9, adult: Secondary | ICD-10-CM | POA: Diagnosis not present

## 2020-11-17 ENCOUNTER — Other Ambulatory Visit: Payer: Self-pay

## 2020-11-17 ENCOUNTER — Ambulatory Visit (INDEPENDENT_AMBULATORY_CARE_PROVIDER_SITE_OTHER): Payer: BC Managed Care – PPO | Admitting: Podiatry

## 2020-11-17 ENCOUNTER — Encounter: Payer: Self-pay | Admitting: Podiatry

## 2020-11-17 ENCOUNTER — Ambulatory Visit (INDEPENDENT_AMBULATORY_CARE_PROVIDER_SITE_OTHER): Payer: BC Managed Care – PPO

## 2020-11-17 DIAGNOSIS — M21612 Bunion of left foot: Secondary | ICD-10-CM | POA: Diagnosis not present

## 2020-11-17 DIAGNOSIS — M79672 Pain in left foot: Secondary | ICD-10-CM

## 2020-11-17 DIAGNOSIS — M21611 Bunion of right foot: Secondary | ICD-10-CM

## 2020-11-17 DIAGNOSIS — M21619 Bunion of unspecified foot: Secondary | ICD-10-CM | POA: Diagnosis not present

## 2020-11-17 DIAGNOSIS — M79671 Pain in right foot: Secondary | ICD-10-CM

## 2020-11-17 DIAGNOSIS — G629 Polyneuropathy, unspecified: Secondary | ICD-10-CM | POA: Diagnosis not present

## 2020-11-18 ENCOUNTER — Ambulatory Visit (INDEPENDENT_AMBULATORY_CARE_PROVIDER_SITE_OTHER): Payer: BC Managed Care – PPO

## 2020-11-18 DIAGNOSIS — J309 Allergic rhinitis, unspecified: Secondary | ICD-10-CM

## 2020-11-18 NOTE — Progress Notes (Signed)
Subjective:   Patient ID: Desiree Dunn, female   DOB: 50 y.o.   MRN: [id number]   HPI Patient presents stating she has several concerns including bunion deformity concerns about numbness in her toes and also just generalized pain   ROS      Objective:  Physical Exam  Neurovascular status found to be intact with mild diminishment sharp dull but within normal limits with patient found to have moderate structural deformity noted bilateral and is noted to have digital deformities with previous surgery which have been done on several toes.  Patient does have good digital perfusion was found to be well oriented with no muscle strength loss     Assessment:  Possibility for low-grade neuropathy with patient who has relatively poor structural integrity and digital deformity along with bunion deformities     Plan:  H&P reviewed condition and I do not think the numbness will be a problem and it should go away over time but if it were to progress we will get nerve conduction studies.  I explained things for her to watch out for and to also watch for any gait instability and I do not recommend structural correction currently of deformity  X-rays indicate structurally she does have bunion deformity history of hammertoe correction with alignment relatively good from that perspective

## 2020-11-20 ENCOUNTER — Other Ambulatory Visit: Payer: Self-pay | Admitting: Allergy

## 2020-11-20 ENCOUNTER — Other Ambulatory Visit: Payer: Self-pay | Admitting: Internal Medicine

## 2020-11-25 ENCOUNTER — Other Ambulatory Visit: Payer: Self-pay | Admitting: Podiatry

## 2020-11-25 ENCOUNTER — Ambulatory Visit (INDEPENDENT_AMBULATORY_CARE_PROVIDER_SITE_OTHER): Payer: BC Managed Care – PPO | Admitting: *Deleted

## 2020-11-25 ENCOUNTER — Encounter: Payer: Self-pay | Admitting: Internal Medicine

## 2020-11-25 DIAGNOSIS — M21619 Bunion of unspecified foot: Secondary | ICD-10-CM

## 2020-11-25 DIAGNOSIS — J309 Allergic rhinitis, unspecified: Secondary | ICD-10-CM

## 2020-12-01 ENCOUNTER — Other Ambulatory Visit: Payer: Self-pay

## 2020-12-01 ENCOUNTER — Ambulatory Visit (INDEPENDENT_AMBULATORY_CARE_PROVIDER_SITE_OTHER): Payer: BC Managed Care – PPO

## 2020-12-01 ENCOUNTER — Encounter: Payer: Self-pay | Admitting: Internal Medicine

## 2020-12-01 ENCOUNTER — Ambulatory Visit: Payer: BC Managed Care – PPO | Admitting: Internal Medicine

## 2020-12-01 VITALS — BP 112/66 | HR 93 | Temp 98.2°F | Ht 64.0 in | Wt 241.2 lb

## 2020-12-01 DIAGNOSIS — I10 Essential (primary) hypertension: Secondary | ICD-10-CM

## 2020-12-01 DIAGNOSIS — J309 Allergic rhinitis, unspecified: Secondary | ICD-10-CM | POA: Diagnosis not present

## 2020-12-01 DIAGNOSIS — R5383 Other fatigue: Secondary | ICD-10-CM | POA: Diagnosis not present

## 2020-12-01 DIAGNOSIS — R202 Paresthesia of skin: Secondary | ICD-10-CM

## 2020-12-01 DIAGNOSIS — Z6841 Body Mass Index (BMI) 40.0 and over, adult: Secondary | ICD-10-CM

## 2020-12-01 MED ORDER — CYANOCOBALAMIN 1000 MCG/ML IJ SOLN
1000.0000 ug | Freq: Once | INTRAMUSCULAR | Status: AC
  Administered 2020-12-01: 1000 ug via INTRAMUSCULAR

## 2020-12-01 NOTE — Progress Notes (Unsigned)
I,Katawbba Wiggins,acting as a Education administrator for [name], MD.,have documented all relevant documentation on the behalf of [name], MD,as directed by  [name], MD while in the presence of [name], MD.  This visit occurred during the SARS-CoV-2 public health emergency.  Safety protocols were in place, including screening questions prior to the visit, additional usage of staff PPE, and extensive cleaning of exam room while observing appropriate contact time as indicated for disinfecting solutions.  Subjective:     Patient ID: Desiree Dunn , female    DOB: Apr 02, 1971 , 50 y.o.   MRN: [id number]   Chief Complaint  Patient presents with  . Numbness    HPI  The patient is here today for diabetes evaluation.  The patient said she has been having some tingling on the bottom of both her feet for about 6 weeks or so.  She reports her sx started after Thanksgiving. She is not sure what could have triggered her sx. She is not wearing any new shoes. She saw her podiatrist Dr. Paulla Dolly 2 weeks ago who suggested she have bloodwork for diabetes. Her sx occur during the day and night. She denies LE weakness.   Hypertension This is a chronic problem. The current episode started more than 1 year ago. The problem has been gradually improving since onset. The problem is controlled. Pertinent negatives include no blurred vision, chest pain, palpitations or shortness of breath. Risk factors for coronary artery disease include obesity and sedentary lifestyle. Past treatments include angiotensin blockers and calcium channel blockers. The current treatment provides moderate improvement.     Past Medical History:  Diagnosis Date  . Allergy   . Anxiety   . Back pain   . Chronic RLQ pain    since 1995 when she had ectopic preg  . Constipation   . Deviated septum   . Diverticulitis   . Ectopic pregnancy 1995    x 1 , R side   . Eczema   . GERD (gastroesophageal reflux disease)    . HTN (hypertension) 05/24/2015  . Migraine without aura and without status migrainosus, not intractable   . Miscarriage    x 1  . Obesity, unspecified 07/06/2010  . Pelvic congestion    h/o   . Sleep apnea    uses cpap     Family History  Problem Relation Age of Onset  . Colon cancer Mother 67  . Colon cancer Maternal Aunt 78  . Stroke Father   . Diabetes Father   . Hypertension Father   . Hyperlipidemia Father   . Heart disease Father   . Obesity Father   . Hypertension Sister   . Colon polyps Brother   . Colitis Brother   . Depression Son   . Asperger's syndrome Son   . Breast cancer Paternal Grandmother   . Prostate cancer Brother   . Heart attack Neg Hx   . Esophageal cancer Neg Hx   . Rectal cancer Neg Hx   . Stomach cancer Neg Hx      Current Outpatient Medications:  .  acetaminophen (TYLENOL) 500 MG tablet, Take 1,000 mg by mouth as needed for mild pain or headache., Disp: , Rfl:  .  albuterol (VENTOLIN HFA) 108 (90 Base) MCG/ACT inhaler, TAKE 2 PUFFS BY MOUTH EVERY 4 HOURS AS NEEDED, Disp: 18 g, Rfl: 1 .  Azelastine-Fluticasone 137-50 MCG/ACT SUSP, Place 1 spray into the nose 2 (two) times daily., Disp: 23 g, Rfl: 5 .  EPINEPHrine (EPIPEN 2-PAK) 0.3 mg/0.3 mL IJ SOAJ injection, Inject 0.3 mg into the muscle as needed for anaphylaxis., Disp: 2 each, Rfl: 2 .  hydrochlorothiazide (MICROZIDE) 12.5 MG capsule, Take 1 capsule (12.5 mg total) by mouth daily., Disp: 90 capsule, Rfl: 3 .  ibuprofen (ADVIL,MOTRIN) 200 MG tablet, Take 400 mg by mouth as needed for moderate pain., Disp: , Rfl:  .  montelukast (SINGULAIR) 10 MG tablet, TAKE 1 TABLET BY MOUTH EVERYDAY AT BEDTIME, Disp: 90 tablet, Rfl: 1 .  olmesartan (BENICAR) 40 MG tablet, TAKE 1 TABLET BY MOUTH EVERY DAY, Disp: 90 tablet, Rfl: 1 .  Olopatadine HCl (PAZEO) 0.7 % SOLN, Place 1 drop into both eyes 1 day or 1 dose. (Patient taking differently: Place 1 drop into both eyes 1 day or 1 dose. prn), Disp: 2.5 mL, Rfl:  5 .  Omega-3 Fatty Acids (OMEGA 3 PO), Take by mouth. Some days, Disp: , Rfl:  .  Probiotic Product (PROBIOTIC DAILY PO), Take by mouth., Disp: , Rfl:  .  TURMERIC PO, Take by mouth. AS NEEDED, Disp: , Rfl:  .  verapamil (CALAN-SR) 120 MG CR tablet, Take 1 tablet (120 mg total) by mouth daily., Disp: 90 tablet, Rfl: 1   Allergies  Allergen Reactions  . Amoxicillin Hives    Hives     Review of Systems  Constitutional: Negative.   Eyes: Negative for blurred vision.  Respiratory: Negative.  Negative for shortness of breath.   Cardiovascular: Negative.  Negative for chest pain and palpitations.  Gastrointestinal: Negative.   Neurological: Positive for numbness.  Psychiatric/Behavioral: Negative.   All other systems reviewed and are negative.    Today's Vitals   12/01/20 1138  BP: 112/66  Pulse: 93  Temp: 98.2 F (36.8 C)  TempSrc: Oral  Weight: 241 lb 3.2 oz (109.4 kg)  Height: 5' 4" (1.626 m)  PainSc: 6   PainLoc: Foot   Body mass index is 41.4 kg/m.  Wt Readings from Last 3 Encounters:  12/01/20 241 lb 3.2 oz (109.4 kg)  10/11/20 234 lb 9.6 oz (106.4 kg)  09/12/20 234 lb (106.1 kg)   Objective:  Physical Exam Vitals and nursing note reviewed.  Constitutional:      Appearance: Normal appearance. She is obese.  HENT:     Head: Normocephalic and atraumatic.  Cardiovascular:     Rate and Rhythm: Normal rate and regular rhythm.     Heart sounds: Normal heart sounds.  Pulmonary:     Breath sounds: Normal breath sounds.  Musculoskeletal:     Cervical back: Normal range of motion.  Skin:    General: Skin is warm.  Neurological:     General: No focal deficit present.     Mental Status: She is alert and oriented to person, place, and time.         Assessment And Plan:     1. Paresthesias Comments: I will check vitamin B12 level along with a1c today. I will schedule b/l LE nerve conduction study if her labs are normal. She is agreeable with treatment plan. -  Hemoglobin A1c - Vitamin B12 - cyanocobalamin ((VITAMIN B-12)) injection 1,000 mcg  2. Other fatigue Comments: Pt advised that low vitamin B12 levels can contribute to fatigue. Encouraged to stay well hdyrated and aim for 7-8 hours of sleep per night. She was also given vitamin B12 injection, she is advised to let me know if this improves her energy level.  - cyanocobalamin ((VITAMIN B-12)) injection 1,000 mcg  3. Essential hypertension Comments: Chronic, well controlled. She will continue with olmesartan and hctz.  - BMP8+EGFR  4. Class 3 severe obesity due to excess calories with serious comorbidity and body mass index (BMI) of 40.0 to 44.9 in adult [facility]) Comments: BMI 41. Advised to aim for at least 150 minutes of exercise per week and to avoid sugary beverages, including diet drinks.    Patient was given opportunity to ask questions. Patient verbalized understanding of the plan and was able to repeat key elements of the plan. All questions were answered to their satisfaction.  [name], MD   I, [name], MD, have reviewed all documentation for this visit. The documentation on 12/04/20 for the exam, diagnosis, procedures, and orders are all accurate and complete.  THE PATIENT IS ENCOURAGED TO PRACTICE SOCIAL DISTANCING DUE TO THE COVID-19 PANDEMIC.

## 2020-12-03 LAB — BMP8+EGFR
BUN/Creatinine Ratio: 10 (ref 9–23)
BUN: 9 mg/dL (ref 6–24)
CO2: 25 mmol/L (ref 20–29)
Calcium: 9.9 mg/dL (ref 8.7–10.2)
Chloride: 100 mmol/L (ref 96–106)
Creatinine, Ser: 0.93 mg/dL (ref 0.57–1.00)
GFR calc Af Amer: 83 mL/min/{1.73_m2} (ref >59)
GFR calc non Af Amer: 72 mL/min/{1.73_m2} (ref >59)
Glucose: 87 mg/dL (ref 65–99)
Potassium: 4 mmol/L (ref 3.5–5.2)
Sodium: 138 mmol/L (ref 134–144)

## 2020-12-03 LAB — HEMOGLOBIN A1C
Est. average glucose Bld gHb Est-mCnc: 120 mg/dL
Hgb A1c MFr Bld: 5.8 % — ABNORMAL HIGH (ref 4.8–5.6)

## 2020-12-03 LAB — VITAMIN B12: Vitamin B-12: 1192 pg/mL (ref 232–1245)

## 2020-12-04 ENCOUNTER — Other Ambulatory Visit: Payer: Self-pay | Admitting: Internal Medicine

## 2020-12-04 DIAGNOSIS — R202 Paresthesia of skin: Secondary | ICD-10-CM

## 2020-12-09 ENCOUNTER — Ambulatory Visit (INDEPENDENT_AMBULATORY_CARE_PROVIDER_SITE_OTHER): Payer: BC Managed Care – PPO

## 2020-12-09 DIAGNOSIS — J309 Allergic rhinitis, unspecified: Secondary | ICD-10-CM | POA: Diagnosis not present

## 2020-12-12 ENCOUNTER — Ambulatory Visit (INDEPENDENT_AMBULATORY_CARE_PROVIDER_SITE_OTHER): Payer: BC Managed Care – PPO | Admitting: Adult Health

## 2020-12-12 ENCOUNTER — Encounter: Payer: Self-pay | Admitting: Adult Health

## 2020-12-12 VITALS — BP 126/79 | HR 80 | Ht 64.0 in | Wt 233.0 lb

## 2020-12-12 DIAGNOSIS — G4733 Obstructive sleep apnea (adult) (pediatric): Secondary | ICD-10-CM

## 2020-12-12 DIAGNOSIS — Z9989 Dependence on other enabling machines and devices: Secondary | ICD-10-CM

## 2020-12-12 NOTE — Progress Notes (Signed)
PATIENT: Desiree Dunn DOB: 06/18/1971  REASON FOR VISIT: follow up HISTORY FROM: patient  HISTORY OF PRESENT ILLNESS: Today 12/12/20:  [name] is a 50 year old female with a history of obstructive sleep apnea on CPAP.  She reports that the CPAP is working well for her.  She denies any issues with using her machine.  She returns today for follow-up.  Compliance Report: Compliance  Usage 11/12/2020 - 12/11/2020 Usage days 28/30 days (93%) >= 4 hours 26 days (87%) Average usage (days used) 6 hours 1 minutes  AirSense 10 AutoSet For Her Serial number [id number] Mode AutoSet for Her Min Pressure 6 cmH2O Max Pressure 16 cmH2O EPR Fulltime EPR level 3  Therapy Pressure - cmH2O Median: 8.1 95th percentile: 9.7 Maximum: 10.3 Leaks - L/min Median: 0.0 95th percentile: 0.7 Maximum: 5.2 Events per hour AI: 0.7 HI: 0.2 AHI: 0.9 Apnea Index Central: 0.2 Obstructive: 0.5 Unknown: 0.0   12/10/19: [name] is a 50 year old female with a history of obstructive sleep apnea on CPAP.  She returns today for follow-up.  Her CPAP download indicates that she has been using her machine 25 out of 30 days for compliance of 83%.  She use her machine greater than 4 hours 24 days for compliance of 80%.  On average she uses her machine 5 hours and 56 minutes.  Her residual AHI is 1.5 on 6 to 16 cm of water with EPR of 3.  Her leak in the 95th percentile is 0.6 L/min.  She states that since she has been using the CPAP more consistently she can tell that she feels more rested during the day.  She reports that she is also trying to exercise and lose weight.  She returns today for an evaluation.  HISTORY 06/04/19:  [name] is a 50 year old female with a history of obstructive sleep apnea on CPAP.  She states that in December she had her deviated septum repair.  She reports that since then she has not been using the CPAP consistently.  She states that she has a  hard time wearing the full facemask.  She also notes that her husband states that she does not snore nearly as much since she had the surgery.  Her Epworth sleepiness score is 3 and fatigue severity score is 23.  She returns today for follow-up.  REVIEW OF SYSTEMS: Out of a complete 14 system review of symptoms, the patient complains only of the following symptoms, and all other reviewed systems are negative.  Epworth sleepiness scale: 3 Fatigue severity score 55--patient feels that it is related to menopause ALLERGIES: Allergies  Allergen Reactions  . Amoxicillin Hives    Hives    HOME MEDICATIONS: Outpatient Medications Prior to Visit  Medication Sig Dispense Refill  . acetaminophen (TYLENOL) 500 MG tablet Take 1,000 mg by mouth as needed for mild pain or headache.    . albuterol (VENTOLIN HFA) 108 (90 Base) MCG/ACT inhaler TAKE 2 PUFFS BY MOUTH EVERY 4 HOURS AS NEEDED 18 g 1  . Azelastine-Fluticasone 137-50 MCG/ACT SUSP Place 1 spray into the nose 2 (two) times daily. 23 g 5  . EPINEPHrine (EPIPEN 2-PAK) 0.3 mg/0.3 mL IJ SOAJ injection Inject 0.3 mg into the muscle as needed for anaphylaxis. 2 each 2  . hydrochlorothiazide (MICROZIDE) 12.5 MG capsule Take 1 capsule (12.5 mg total) by mouth daily. 90 capsule 3  . ibuprofen (ADVIL,MOTRIN) 200 MG tablet Take 400 mg by mouth as needed for moderate pain.    [name]  montelukast (SINGULAIR) 10 MG tablet TAKE 1 TABLET BY MOUTH EVERYDAY AT BEDTIME 90 tablet 1  . olmesartan (BENICAR) 40 MG tablet TAKE 1 TABLET BY MOUTH EVERY DAY 90 tablet 1  . Olopatadine HCl (PAZEO) 0.7 % SOLN Place 1 drop into both eyes 1 day or 1 dose. (Patient taking differently: Place 1 drop into both eyes 1 day or 1 dose. prn) 2.5 mL 5  . Omega-3 Fatty Acids (OMEGA 3 PO) Take by mouth. Some days    . Probiotic Product (PROBIOTIC DAILY PO) Take by mouth.    . TURMERIC PO Take by mouth. AS NEEDED    . verapamil (CALAN-SR) 120 MG CR tablet Take 1 tablet (120 mg total) by mouth  daily. 90 tablet 1   No facility-administered medications prior to visit.    PAST MEDICAL HISTORY: Past Medical History:  Diagnosis Date  . Allergy   . Anxiety   . Back pain   . Chronic RLQ pain    since 1995 when she had ectopic preg  . Constipation   . Deviated septum   . Diverticulitis   . Ectopic pregnancy 1995    x 1 , R side   . Eczema   . GERD (gastroesophageal reflux disease)   . HTN (hypertension) 05/24/2015  . Migraine without aura and without status migrainosus, not intractable   . Miscarriage    x 1  . Obesity, unspecified 07/06/2010  . Pelvic congestion    h/o   . Sleep apnea    uses cpap    PAST SURGICAL HISTORY: Past Surgical History:  Procedure Laterality Date  . ABDOMINAL HYSTERECTOMY  12/2009   [facility]  . BREAST REDUCTION SURGERY  12/09  . COLONOSCOPY  2001   X3 ; diverticulosis  . FOOT SURGERY    . NASAL SEPTOPLASTY W/ TURBINOPLASTY Bilateral 10/31/2018   Procedure: NASAL SEPTOPLASTY WITH TURBINATE REDUCTION;  Surgeon: Jerrell Belfast, MD;  Location: [place];  Service: ENT;  Laterality: Bilateral;  . PARTIAL HYSTERECTOMY    . POLYPECTOMY     [facility]   . REDUCTION MAMMAPLASTY Bilateral   . RIGHT OOPHORECTOMY  12/2009   [place]  . TUBAL LIGATION  2000    FAMILY HISTORY: Family History  Problem Relation Age of Onset  . Colon cancer Mother 95  . Colon cancer Maternal Aunt 2  . Stroke Father   . Diabetes Father   . Hypertension Father   . Hyperlipidemia Father   . Heart disease Father   . Obesity Father   . Hypertension Sister   . Colon polyps Brother   . Colitis Brother   . Depression Son   . Asperger's syndrome Son   . Breast cancer Paternal Grandmother   . Prostate cancer Brother   . Heart attack Neg Hx   . Esophageal cancer Neg Hx   . Rectal cancer Neg Hx   . Stomach cancer Neg Hx     SOCIAL HISTORY: Social History   Socioeconomic History  . Marital status: Married    Spouse name: Juanda Crumble  . Number of children: 2  . Years  of education: Not on file  . Highest education level: Not on file  Occupational History  . Occupation: Counsellor, Par time  . Occupation: works full time at [employer]: [employer]  Tobacco Use  . Smoking status: Never Smoker  . Smokeless tobacco: Never Used  Vaping Use  . Vaping Use: Never used  Substance and Sexual Activity  .  Alcohol use: Yes    Alcohol/week: 0.0 standard drinks    Comment: socially - 1 drink every 3 months  . Drug use: No  . Sexual activity: Yes    Partners: Male    Birth control/protection: Surgical  Other Topics Concern  . Not on file  Social History Narrative   Household-- pt , husband and children   daughter 83   son 21   Social Determinants of Radio broadcast assistant Strain: Not on file  Food Insecurity: Not on file  Transportation Needs: Not on file  Physical Activity: Not on file  Stress: Not on file  Social Connections: Not on file  Intimate Partner Violence: Not on file      PHYSICAL EXAM  Vitals:   12/12/20 1425  Weight: 233 lb (105.7 kg)   Body mass index is 39.99 kg/m.  Generalized: Well developed, in no acute distress  Chest: Lungs clear to auscultation bilaterally  Neurological examination  Mentation: Alert oriented to time, place, history taking. Follows all commands speech and language fluent Cranial nerve II-XII: Extraocular movements were full, visual field were full on confrontational test Head turning and shoulder shrug  were normal and symmetric. Motor: The motor testing reveals 5 over 5 strength of all 4 extremities. Good symmetric motor tone is noted throughout.  Sensory: Sensory testing is intact to soft touch on all 4 extremities. No evidence of extinction is noted.  Gait and station: Gait is normal.     DIAGNOSTIC DATA (LABS, IMAGING, TESTING) - I reviewed patient records, labs, notes, testing and imaging myself where available.  Lab Results  Component Value Date   WBC 6.0  04/28/2020   HGB 13.3 04/28/2020   HCT 39.3 04/28/2020   MCV 91 04/28/2020   PLT 231 04/28/2020      Component Value Date/Time   NA 138 12/01/2020 1732   K 4.0 12/01/2020 1732   CL 100 12/01/2020 1732   CO2 25 12/01/2020 1732   GLUCOSE 87 12/01/2020 1732   GLUCOSE 91 10/23/2018 0831   BUN 9 12/01/2020 1732   CREATININE 0.93 12/01/2020 1732   CALCIUM 9.9 12/01/2020 1732   PROT 7.5 04/28/2020 1041   ALBUMIN 4.5 04/28/2020 1041   AST 43 (H) 04/28/2020 1041   ALT 52 (H) 04/28/2020 1041   ALKPHOS 76 04/28/2020 1041   BILITOT 0.4 04/28/2020 1041   GFRNONAA 72 12/01/2020 1732   GFRAA 83 12/01/2020 1732   Lab Results  Component Value Date   CHOL 207 (H) 04/28/2020   HDL 71 04/28/2020   LDLCALC 113 (H) 04/28/2020   TRIG 132 04/28/2020   CHOLHDL 2.9 04/28/2020   Lab Results  Component Value Date   HGBA1C 5.8 (H) 12/01/2020   Lab Results  Component Value Date   VITAMINB12 1,192 12/01/2020   Lab Results  Component Value Date   TSH 1.150 04/28/2020      ASSESSMENT AND PLAN 50 y.o. year old female  has a past medical history of Allergy, Anxiety, Back pain, Chronic RLQ pain, Constipation, Deviated septum, Diverticulitis, Ectopic pregnancy (1995 ), Eczema, GERD (gastroesophageal reflux disease), HTN (hypertension) (05/24/2015), Migraine without aura and without status migrainosus, not intractable, Miscarriage, Obesity, unspecified (07/06/2010), Pelvic congestion, and Sleep apnea. here with:  1. Obstructive sleep apnea on CPAP  Patient CPAP download shows excellent compliance and good treatment of her apnea.  She is encouraged to continue using CPAP nightly and greater than 4 hours each night.  She is advised that if her  symptoms worsen or she develops new symptoms she should let us know.  She will follow-up in 1 year or sooner if needed    I spent 20 minutes of face-to-face and non-face-to-face time with patient.  This included previsit chart review, lab review, study review,  order entry, electronic health record documentation, patient education.   Ward Givens, MSN, NP-C 12/12/2020, 2:26 PM [facility] [address] [place], [place] [address] [phone]

## 2020-12-12 NOTE — Patient Instructions (Signed)
Continue using CPAP nightly and greater than 4 hours each night °If your symptoms worsen or you develop new symptoms please let us know.  ° °

## 2020-12-15 ENCOUNTER — Encounter: Payer: Self-pay | Admitting: Neurology

## 2020-12-15 ENCOUNTER — Ambulatory Visit (INDEPENDENT_AMBULATORY_CARE_PROVIDER_SITE_OTHER): Payer: BC Managed Care – PPO | Admitting: Neurology

## 2020-12-15 ENCOUNTER — Ambulatory Visit (INDEPENDENT_AMBULATORY_CARE_PROVIDER_SITE_OTHER): Payer: BC Managed Care – PPO

## 2020-12-15 VITALS — BP 102/72 | HR 81 | Ht 64.0 in | Wt 238.0 lb

## 2020-12-15 DIAGNOSIS — E662 Morbid (severe) obesity with alveolar hypoventilation: Secondary | ICD-10-CM

## 2020-12-15 DIAGNOSIS — J309 Allergic rhinitis, unspecified: Secondary | ICD-10-CM | POA: Diagnosis not present

## 2020-12-15 DIAGNOSIS — M79671 Pain in right foot: Secondary | ICD-10-CM | POA: Diagnosis not present

## 2020-12-15 DIAGNOSIS — E8881 Metabolic syndrome: Secondary | ICD-10-CM

## 2020-12-15 DIAGNOSIS — M79672 Pain in left foot: Secondary | ICD-10-CM

## 2020-12-15 DIAGNOSIS — Z6841 Body Mass Index (BMI) 40.0 and over, adult: Secondary | ICD-10-CM

## 2020-12-15 NOTE — Progress Notes (Signed)
S[facility]EP MED[facility]INE [facility]INIC   Provider:  Larey Seat, MD   Primary Care Physician:  Glendale Chard, MD  Referring Provider: Glendale Chard, MD   Chief Complaint  Patient presents with  . Neurologic Problem    Pt alone, rm 11.  Presents today to discuss new concerns related to new onset of pain in her feet bilaterally.  Patient states this started about a month ago.  Wakes up with a tingling sensation, states it feels like needles in her feet.  She followed with podiatry who wanted her to evaluate if diabetes was a concern.  At this time labs looked okay.  Patient has not had any testing (NCV/EMG)   Interval history - new problem on 12-15-2020: I have the pleasure of seeing [name] today a couple of days after her 50st birthday.  She has remained a primary care patient of Dr. Gertie Exon and recently approached her about tingling and sometimes painful sensation in the bottom of both feet.  She states that there were no new shoes and not really lifestyle issues that changed.  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  She was tested for HbA1c by Dr. Baird Cancer which has been found elevated in the prediabetic range at 5.8.  This test was recommended by her podiatrist Dr. Paulla Dolly.  We are still lacking insight and what really causes her foot pain and abnormal sensation, her vitamin B12 level was very high and she is supplementing this.  She has not had any other deficiencies her diet has not changed but will probably need to change to low-carb low sugar and high protein.  She states the pain is in both feet noticeable when she goes to bed at rest. So I will order a nerve conduction study and EMG to further addresses, since it is in the peripheral nerves of both feet, she has a painful nodule in her fourth toe of her left foot, she does not report dense pain in the big toes or in the interspace between big toe and second  toe.  The area is not numb either.    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  There was primary loud snoring and oral breathing.  She appeared rather restless.  The study included a attempt to treat her with CPAP therapy and the CPAP titration part was incomplete as central apneas emerged.  I therefore ordered an auto titration CPAP I also referred her for ENT surgery and recommended a medically supervised weight loss program. She received an auto titration CPAP between 5 and 16 cmH2O with 3 cm EPR, her average user time on days used is 5 hours and 43 minutes but she was unable to use the CPAP machine since the 15th of this month due to nasal congestion and sinusitis.  She had minor air leaks the 95th percentile pressure was only 10 cmH2O and the AHI was reduced to 2.1.  I consider the settings as working very well for her, but she certainly will benefit from a nasal septum surgical correction.  In addition we reviewed today her current fatigue and sleepiness score her Epworth sleepiness score on CPAP is down to 5 points from  previously 9 her fatigue severity however remains high which also may be stress-induced.  HPI:  Desiree Dunn is a 50 y.o. female patient who had been evaluated for sleep apnea in 2012, following a referral by Dr. Erling Cruz, and was diagnosed with OSA, given CPAP but couldn't get used to it.   She is seen here in a re- referral from Dr. Baird Cancer. She had a partial hysterectomy 2-/2011, left one ovary and has now still menopausal symptoms at night.  Chief complaint according to patient : She feels her energy has decreased greatly.   Sleep habits (in consultation interview) are as follows: 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  Average time on week days to return home is between 6 and 7 PM, dinner at home bedtime at 11.00 - her bedroom is cool, quiet and dark. Sleeps on her right side, on 2 pillows, she is usually asleep within 30 minutes. Her husband reports her to snore loudly, and she goes to the bathroom 3-4 times each night.  Stays asleep not longer than 2 hours en bloc. sometimes she finds herself unable to return to sleep. Aches and pains and worries . She dreams less she feels. She rises at 6.30 and feels not refreshed nor rested. Has headaches in AM!! Dry mouth, Heartburn, not short of breath, no dizziness, palpitations , but diaphoresis is positively a factor.    Sleep medical history and family sleep history:  OSA not diagnosed in parents, one brother has OSA.  [name] as she was known in 2012 underwent a sleep study ordered by Dr. Morene Antu on 14 September of that year, she had a history of morning migraine headaches, obesity, allergic rhinitis fatigue and was noted to snore.  She was not excessively daytime sleepy at the time but she did endorse depression symptoms.  BMI at the time was 35.  I do not have her baseline results available.  The patient was titrated treated to CPAP so obviously the have to be a study before diagnosing her.  During the night of the titration it was evident that she woke up very frequently, she had mostly central apneas on the CPAP residual AHI was 4.8, RDI 7.9 which indicates that she snores loudly.  Supine AHI was 3.2 there were no PLM's and her heart rate was regular.  It was noted that the previous polysomnography showed mild apnea.  Improvement was under CPAP pressure of 9 cmH2O, snoring treatment was not fully successful the patient required 15 cmH2O for that, but under these high pressures central apneas emerged.  I had  written for a nasal mask or nasal pillow at the time.  The order was issued on 07 August 2011.  Social history:  Psychologist, educational, [employer]  She is working many side jobs.  Non smoker -  Social drinker, 2 / month. Caffeine:  coffee 3-5 times a week, not iced tea, no black tea, sodas without caffeine.   Review of Systems: Out of a complete 14 system review, the patient complains of only the following symptoms, and all other reviewed systems are negative. Snoring, fatigue , nasal congestion, chronic sinus pressure, headaches.   Epworth Sleepiness score on CPAP , now    from 9 points , Fatigue severity score 62  ,  depression score 5/15    Social History   Socioeconomic History  . Marital status: Married    Spouse name: Juanda Crumble  . Number of children: 2  . Years of education: Not on file  . Highest education level: Not on file  Occupational History  . Occupation: Counsellor, Par time  . Occupation: works full time at [employer]: [employer]  Tobacco Use  . Smoking status: Never Smoker  . Smokeless tobacco: Never Used  Vaping Use  . Vaping Use: Never used  Substance and Sexual Activity  . Alcohol use: Yes    Alcohol/week: 0.0 standard drinks    Comment: socially - 1 drink every 3 months  . Drug use: No  . Sexual activity: Yes    Partners: Male    Birth control/protection: Surgical  Other Topics Concern  . Not on file  Social History Narrative   Household-- pt , husband and children   daughter 74   son 78   Social Determinants of Radio broadcast assistant Strain: Not on file  Food Insecurity: Not on file  Transportation Needs: Not on file  Physical Activity: Not on file  Stress: Not on file  Social Connections: Not on file  Intimate Partner Violence: Not on file    Family History  Problem Relation Age of Onset  . Colon cancer Mother 59  . Colon cancer Maternal Aunt 42  . Stroke Father   . Diabetes Father   .  Hypertension Father   . Hyperlipidemia Father   . Heart disease Father   . Obesity Father   . Hypertension Sister   . Colon polyps Brother   . Colitis Brother   . Depression Son   . Asperger's syndrome Son   . Breast cancer Paternal Grandmother   . Prostate cancer Brother   . Heart attack Neg Hx   . Esophageal cancer Neg Hx   . Rectal cancer Neg Hx   . Stomach cancer Neg Hx     Past Medical History:  Diagnosis Date  . Allergy   . Anxiety   . Back pain   . Chronic RLQ pain    since 1995 when she had ectopic preg  . Constipation   . Deviated septum   . Diverticulitis   . Ectopic pregnancy 1995    x 1 , R side   . Eczema   . GERD (gastroesophageal reflux disease)   . HTN (hypertension) 05/24/2015  . Migraine without aura and without status migrainosus, not intractable   . Miscarriage    x 1  . Obesity, unspecified 07/06/2010  . Pelvic congestion    h/o   . Sleep apnea    uses cpap    Past Surgical History:  Procedure Laterality Date  . ABDOMINAL HYSTERECTOMY  12/2009   [facility]  . BREAST REDUCTION SURGERY  12/09  . COLONOSCOPY  2001   X3 ; diverticulosis  . FOOT SURGERY    . NASAL SEPTOPLASTY W/ TURBINOPLASTY Bilateral 10/31/2018   Procedure: NASAL SEPTOPLASTY WITH TURBINATE REDUCTION;  Surgeon: Jerrell Belfast, MD;  Location: [facility];  Service: ENT;  Laterality: Bilateral;  . PARTIAL HYSTERECTOMY    . POLYPECTOMY     HPP   . REDUCTION MAMMAPLASTY Bilateral   . RIGHT OOPHORECTOMY  12/2009   [facility]  . TUBAL LIGATION  2000    Current Outpatient Medications  Medication Sig Dispense Refill  . acetaminophen (TYLENOL) 500 MG tablet Take 1,000 mg by  mouth as needed for mild pain or headache.    . albuterol (VENTOLIN HFA) 108 (90 Base) MCG/ACT inhaler TAKE 2 PUFFS BY MOUTH EVERY 4 HOURS AS NEEDED 18 g 1  . Azelastine-Fluticasone 137-50 MCG/ACT SUSP Place 1 spray into the nose 2 (two) times daily. 23 g 5  . EPINEPHrine (EPIPEN 2-PAK) 0.3 mg/0.3 mL IJ SOAJ injection  Inject 0.3 mg into the muscle as needed for anaphylaxis. 2 each 2  . hydrochlorothiazide (MICROZIDE) 12.5 MG capsule Take 1 capsule (12.5 mg total) by mouth daily. 90 capsule 3  . ibuprofen (ADVIL,MOTRIN) 200 MG tablet Take 400 mg by mouth as needed for moderate pain.    . montelukast (SINGULAIR) 10 MG tablet TAKE 1 TABLET BY MOUTH EVERYDAY AT BEDTIME 90 tablet 1  . olmesartan (BENICAR) 40 MG tablet TAKE 1 TABLET BY MOUTH EVERY DAY 90 tablet 1  . Olopatadine HCl (PAZEO) 0.7 % SOLN Place 1 drop into both eyes 1 day or 1 dose. (Patient taking differently: Place 1 drop into both eyes 1 day or 1 dose. As needed) 2.5 mL 5  . Omega-3 Fatty Acids (OMEGA 3 PO) Take by mouth. Some days    . Probiotic Product (PROBIOTIC DAILY PO) Take by mouth.    . TURMERIC PO Take by mouth. AS NEEDED    . verapamil (CALAN-SR) 120 MG CR tablet Take 1 tablet (120 mg total) by mouth daily. 90 tablet 1   No current facility-administered medications for this visit.    Allergies as of 12/15/2020 - Review Complete 12/15/2020  Allergen Reaction Noted  . Amoxicillin Hives 09/09/2007    Vitals: BP 102/72   Pulse 81   Ht 5\' 4"  (1.626 m)   Wt 238 lb (108 kg)   BMI 40.85 kg/m  Last Weight:  Wt Readings from Last 1 Encounters:  12/15/20 238 lb (108 kg)   [id number] mass index is 40.85 kg/m.     Last Height:   Ht Readings from Last 1 Encounters:  12/15/20 5\' 4"  (1.626 m)    Physical exam:  Patient BMI is  40.85, up from last year.     General: The patient is awake, alert and appears not in acute distress. The patient is well groomed. Head: Normocephalic, atraumatic. Neck is supple. Mallampati 4  neck circumference:17 ". Nasal airflow completely restricted- swollen lining of the nasal airway, nasal voice. today patent on the right, congested on the left side, Retrognathia is seen.  Cardiovascular:  Regular rate and rhythm, without  murmurs or carotid bruit, and without distended neck veins. Respiratory: Lungs are  clear to auscultation. Skin:  Without evidence of edema, or rash Trunk: obese- The patient's posture is erect   Neurologic exam : The patient is awake and alert, oriented to place and time.   Attention span & concentration ability appears normal.  Speech is fluent,  with dysphonia or aphasia.  Mood and affect are appropriate.  Cranial nerves: no loss of smell or taste. Had Covid March 2021.  Pupils are equal and briskly reactive to light. Funduscopic exam without evidence of pallor or edema. Extraocular movements  in vertical and horizontal planes intact and without nystagmus. Visual fields by finger perimetry are intact. Hearing to finger rub intact. Trigger point for sinus headaches are positive, retro-orbital pressure.   Facial motor strength is symmetric , her tongue and uvula move in midline. Shoulder shrug was symmetrical.   Motor exam: Normal tone, muscle bulk and symmetric strength in all extremities.Sensory:  Temperature  and vibration were reduced in both feet all toes.  . Coordination:  Finger-to-nose maneuver  normal  Gait and station: Patient walks without assistive device.Tandem gait is unfragmented. Turns with 3 Steps.  Deep tendon reflexes: in the upper and lower extremities are symmetric and intact.  Assessment:  After physical and neurologic examination, review of the results of polysomnography and / or neurophysiology testing and pre-existing records as far as provided in visit., my assessment is    1) new problem of pre- diabetes and numbness , tingling, pain and pin and needle sensations in both feet. 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  I encouraged the patient to speak to dr. Baird Cancer about Ozempic.  2) OSA - confirmed as of moderate severity- CPAP works in reducing the AHI to 2.1/h.  Compliance was complicated by nasal congestion. ENT Dr. Wilburn Cornelia, performed  surgery on 10-31-2018 . Just seen 12-12-2020 by MM _  Therapy Pressure - cmH2O Median: 8.1 95th percentile: 9.7 Maximum: 10.3 Leaks - L/min Median: 0.0 95th percentile: 0.7 Maximum: 5.2 Events per hour AI: 0.7 HI: 0.2 AHI: 0.9 Apnea Index Central: 0.2 Obstructive: 0.5 Unknown: 0.0  2 b) She still feels some sinus pressure headaches, and retro- orbital pressure.  3)Migraine Headaches: Headaches cause blurred vision, light nausea, photophobia is endorsed.   The patient was advised of the nature of the diagnosed disorder , the treatment options and the  risks for general health and wellness arising from not treating the condition.   I spent more than 30 minutes of face to face time with the patient.  Greater than 50% of time was spent in counseling and coordination of care. We have discussed the diagnosis and differential and I answered the patient's questions.   RV with Np in 2-3 month after  NCV and EMG.      Larey Seat, MD 07/15/2670, 2:45 AM  Certified in Neurology by ABPN Certified in [place] by [facility] [address] [place], [place] [address]

## 2020-12-15 NOTE — Patient Instructions (Signed)
Gabapentin capsules or tablets What is this medicine? GABAPENTIN (GA ba pen tin) is used to control seizures in certain types of epilepsy. It is also used to treat certain types of nerve pain. This medicine may be used for other purposes; ask your health care provider or pharmacist if you have questions. COMMON BRAND NAME(S): Active-PAC with Gabapentin, Orpha Bur, Gralise, Neurontin What should I tell my health care provider before I take this medicine? They need to know if you have any of these conditions:  history of drug abuse or alcohol abuse problem  kidney disease  lung or breathing disease  suicidal thoughts, plans, or attempt; a previous suicide attempt by you or a family member  an unusual or allergic reaction to gabapentin, other medicines, foods, dyes, or preservatives  pregnant or trying to get pregnant  breast-feeding How should I use this medicine? Take this medicine by mouth with a glass of water. Follow the directions on the prescription label. You can take it with or without food. If it upsets your stomach, take it with food. Take your medicine at regular intervals. Do not take it more often than directed. Do not stop taking except on your doctor's advice. If you are directed to break the 600 or 800 mg tablets in half as part of your dose, the extra half tablet should be used for the next dose. If you have not used the extra half tablet within 28 days, it should be thrown away. A special MedGuide will be given to you by the pharmacist with each prescription and refill. Be sure to read this information carefully each time. Talk to your pediatrician regarding the use of this medicine in children. While this drug may be prescribed for children as young as 3 years for selected conditions, precautions do apply. Overdosage: If you think you have taken too much of this medicine contact a poison control center or emergency room at once. NOTE: This medicine is only for you. Do not  share this medicine with others. What if I miss a dose? If you miss a dose, take it as soon as you can. If it is almost time for your next dose, take only that dose. Do not take double or extra doses. What may interact with this medicine? This medicine may interact with the following medications:  alcohol  antihistamines for allergy, cough, and cold  certain medicines for anxiety or sleep  certain medicines for depression like amitriptyline, fluoxetine, sertraline  certain medicines for seizures like phenobarbital, primidone  certain medicines for stomach problems  general anesthetics like halothane, isoflurane, methoxyflurane, propofol  local anesthetics like lidocaine, pramoxine, tetracaine  medicines that relax muscles for surgery  narcotic medicines for pain  phenothiazines like chlorpromazine, mesoridazine, prochlorperazine, thioridazine This list may not describe all possible interactions. Give your health care provider a list of all the medicines, herbs, non-prescription drugs, or dietary supplements you use. Also tell them if you smoke, drink alcohol, or use illegal drugs. Some items may interact with your medicine. What should I watch for while using this medicine? Visit your doctor or health care provider for regular checks on your progress. You may want to keep a record at home of how you feel your condition is responding to treatment. You may want to share this information with your doctor or health care provider at each visit. You should contact your doctor or health care provider if your seizures get worse or if you have any new types of seizures. Do not stop  taking this medicine or any of your seizure medicines unless instructed by your doctor or health care provider. Stopping your medicine suddenly can increase your seizures or their severity. This medicine may cause serious skin reactions. They can happen weeks to months after starting the medicine. Contact your health  care provider right away if you notice fevers or flu-like symptoms with a rash. The rash may be red or purple and then turn into blisters or peeling of the skin. Or, you might notice a red rash with swelling of the face, lips or lymph nodes in your neck or under your arms. Wear a medical identification bracelet or chain if you are taking this medicine for seizures, and carry a card that lists all your medications. You may get drowsy, dizzy, or have blurred vision. Do not drive, use machinery, or do anything that needs mental alertness until you know how this medicine affects you. To reduce dizzy or fainting spells, do not sit or stand up quickly, especially if you are an older patient. Alcohol can increase drowsiness and dizziness. Avoid alcoholic drinks. Your mouth may get dry. Chewing sugarless gum or sucking hard candy, and drinking plenty of water will help. The use of this medicine may increase the chance of suicidal thoughts or actions. Pay special attention to how you are responding while on this medicine. Any worsening of mood, or thoughts of suicide or dying should be reported to your health care provider right away. Women who become pregnant while using this medicine may enroll in the [place] Pregnancy Registry by calling [phone]. This registry collects information about the safety of antiepileptic drug use during pregnancy. What side effects may I notice from receiving this medicine? Side effects that you should report to your doctor or health care professional as soon as possible:  allergic reactions like skin rash, itching or hives, swelling of the face, lips, or tongue  breathing problems  rash, fever, and swollen lymph nodes  redness, blistering, peeling or loosening of the skin, including inside the mouth  suicidal thoughts, mood changes Side effects that usually do not require medical attention (report to your doctor or health care professional if  they continue or are bothersome):  dizziness  drowsiness  headache  nausea, vomiting  swelling of ankles, feet, hands  tiredness This list may not describe all possible side effects. Call your doctor for medical advice about side effects. You may report side effects to FDA at 1-800-FDA-1088. Where should I keep my medicine? Keep out of reach of children. This medicine may cause accidental overdose and death if it taken by other adults, children, or pets. Mix any unused medicine with a substance like cat litter or coffee grounds. Then throw the medicine away in a sealed container like a sealed bag or a coffee can with a lid. Do not use the medicine after the expiration date. Store at room temperature between 15 and 30 degrees C (59 and 86 degrees F). NOTE: This sheet is a summary. It may not cover all possible information. If you have questions about this medicine, talk to your doctor, pharmacist, or health care provider.  2021 Elsevier/Gold Standard (2019-01-30 14:16:43) Neuropathic Pain Neuropathic pain is pain caused by damage to the nerves that are responsible for certain sensations in your body (sensory nerves). The pain can be caused by:  Damage to the sensory nerves that send signals to your spinal cord and brain (peripheral nervous system).  Damage to the sensory nerves in your  brain or spinal cord (central nervous system). Neuropathic pain can make you more sensitive to pain. Even a minor sensation can feel very painful. This is usually a long-term condition that can be difficult to treat. The type of pain differs from person to person. It may:  Start suddenly (acute), or it may develop slowly and last for a long time (chronic).  Come and go as damaged nerves heal, or it may stay at the same level for years.  Cause emotional distress, loss of sleep, and a lower quality of life. What are the causes? The most common cause of this condition is diabetes. Many other diseases and  conditions can also cause neuropathic pain. Causes of neuropathic pain can be classified as:  Toxic. This is caused by medicines and chemicals. The most common cause of toxic neuropathic pain is damage from cancer treatments (chemotherapy).  Metabolic. This can be caused by: ? Diabetes. This is the most common disease that damages the nerves. ? Lack of vitamin B from long-term alcohol abuse.  Traumatic. Any injury that cuts, crushes, or stretches a nerve can cause damage and pain. A common example is feeling pain after losing an arm or leg (phantom limb pain).  Compression-related. If a sensory nerve gets trapped or compressed for a long period of time, the blood supply to the nerve can be cut off.  Vascular. Many blood vessel diseases can cause neuropathic pain by decreasing blood supply and oxygen to nerves.  Autoimmune. This type of pain results from diseases in which the body's defense system (immune system) mistakenly attacks sensory nerves. Examples of autoimmune diseases that can cause neuropathic pain include lupus and multiple sclerosis.  Infectious. Many types of viral infections can damage sensory nerves and cause pain. Shingles infection is a common cause of this type of pain.  Inherited. Neuropathic pain can be a symptom of many diseases that are passed down through families (genetic). What increases the risk? You are more likely to develop this condition if:  You have diabetes.  You smoke.  You drink too much alcohol.  You are taking certain medicines, including medicines that kill cancer cells (chemotherapy) or that treat immune system disorders. What are the signs or symptoms? The main symptom is pain. Neuropathic pain is often described as:  Burning.  Shock-like.  Stinging.  Hot or cold.  Itching. How is this diagnosed? No single test can diagnose neuropathic pain. It is diagnosed based on:  Physical exam and your symptoms. Your health care provider will  ask you about your pain. You may be asked to use a pain scale to describe how bad your pain is.  Tests. These may be done to see if you have a high sensitivity to pain and to help find the cause and location of any sensory nerve damage. They include: ? Nerve conduction studies to test how well nerve signals travel through your sensory nerves (electrodiagnostic testing). ? Stimulating your sensory nerves through electrodes on your skin and measuring the response in your spinal cord and brain (somatosensory evoked potential).  Imaging studies, such as: ? X-rays. ? CT scan. ? MRI. How is this treated? Treatment for neuropathic pain may change over time. You may need to try different treatment options or a combination of treatments. Some options include:  Treating the underlying cause of the neuropathy, such as diabetes, kidney disease, or vitamin deficiencies.  Stopping medicines that can cause neuropathy, such as chemotherapy.  Medicine to relieve pain. Medicines may include: ? Prescription  or over-the-counter pain medicine. ? Anti-seizure medicine. ? Antidepressant medicines. ? Pain-relieving patches that are applied to painful areas of skin. ? A medicine to numb the area (local anesthetic), which can be injected as a nerve block.  Transcutaneous nerve stimulation. This uses electrical currents to block painful nerve signals. The treatment is painless.  Alternative treatments, such as: ? Acupuncture. ? Meditation. ? Massage. ? Physical therapy. ? Pain management programs. ? Counseling. Follow these instructions at home: Medicines  Take over-the-counter and prescription medicines only as told by your health care provider.  Do not drive or use heavy machinery while taking prescription pain medicine.  If you are taking prescription pain medicine, take actions to prevent or treat constipation. Your health care provider may recommend that you: ? Drink enough fluid to keep your  urine pale yellow. ? Eat foods that are high in fiber, such as fresh fruits and vegetables, whole grains, and beans. ? Limit foods that are high in fat and processed sugars, such as fried or sweet foods. ? Take an over-the-counter or prescription medicine for constipation.   Lifestyle  Have a good support system at home.  Consider joining a chronic pain support group.  Do not use any products that contain nicotine or tobacco, such as cigarettes and e-cigarettes. If you need help quitting, ask your health care provider.  Do not drink alcohol.   General instructions  Learn as much as you can about your condition.  Work closely with all your health care providers to find the treatment plan that works best for you.  Ask your health care provider what activities are safe for you.  Keep all follow-up visits as told by your health care provider. This is important. Contact a health care provider if:  Your pain treatments are not working.  You are having side effects from your medicines.  You are struggling with tiredness (fatigue), mood changes, depression, or anxiety. Summary  Neuropathic pain is pain caused by damage to the nerves that are responsible for certain sensations in your body (sensory nerves).  Neuropathic pain may come and go as damaged nerves heal, or it may stay at the same level for years.  Neuropathic pain is usually a long-term condition that can be difficult to treat. Consider joining a chronic pain support group. This information is not intended to replace advice given to you by your health care provider. Make sure you discuss any questions you have with your health care provider. Document Revised: 02/19/2019 Document Reviewed: 11/15/2017 Elsevier Patient Education  2021 [employer].

## 2020-12-21 ENCOUNTER — Ambulatory Visit (INDEPENDENT_AMBULATORY_CARE_PROVIDER_SITE_OTHER): Payer: BC Managed Care – PPO | Admitting: Podiatry

## 2020-12-21 ENCOUNTER — Other Ambulatory Visit: Payer: Self-pay

## 2020-12-21 ENCOUNTER — Ambulatory Visit (INDEPENDENT_AMBULATORY_CARE_PROVIDER_SITE_OTHER): Payer: BC Managed Care – PPO | Admitting: *Deleted

## 2020-12-21 DIAGNOSIS — M779 Enthesopathy, unspecified: Secondary | ICD-10-CM | POA: Diagnosis not present

## 2020-12-21 DIAGNOSIS — J309 Allergic rhinitis, unspecified: Secondary | ICD-10-CM | POA: Diagnosis not present

## 2020-12-21 DIAGNOSIS — M2042 Other hammer toe(s) (acquired), left foot: Secondary | ICD-10-CM

## 2020-12-21 MED ORDER — TRIAMCINOLONE ACETONIDE 10 MG/ML IJ SUSP
10.0000 mg | Freq: Once | INTRAMUSCULAR | Status: AC
  Administered 2020-12-21: 10 mg

## 2020-12-21 NOTE — Progress Notes (Signed)
Subjective:   Patient ID: Desiree Dunn, female   DOB: 50 y.o.   MRN: [id number]   HPI Patient presents with pain of the side of the fourth toe left with inflammation fluid that she had last year and did well   ROS      Objective:  Physical Exam  Neurovascular status intact tach with inflammatory capsulitis inner phalangeal joint lateral side digit 4 left      Assessment:  Inflammatory capsulitis inner phalangeal joint digit 4 left     Plan:  H&P reviewed condition sterile prep done injected the inner phalangeal joint left fourth digit 2 mg dexamethasone Kenalog 2 mg Xylocaine padded the area reappoint as needed

## 2021-01-05 ENCOUNTER — Ambulatory Visit (INDEPENDENT_AMBULATORY_CARE_PROVIDER_SITE_OTHER): Payer: BC Managed Care – PPO

## 2021-01-05 DIAGNOSIS — J309 Allergic rhinitis, unspecified: Secondary | ICD-10-CM

## 2021-01-09 ENCOUNTER — Ambulatory Visit: Payer: BC Managed Care – PPO | Admitting: Internal Medicine

## 2021-01-12 ENCOUNTER — Ambulatory Visit (INDEPENDENT_AMBULATORY_CARE_PROVIDER_SITE_OTHER): Payer: BC Managed Care – PPO | Admitting: Diagnostic Neuroimaging

## 2021-01-12 ENCOUNTER — Encounter (INDEPENDENT_AMBULATORY_CARE_PROVIDER_SITE_OTHER): Payer: BC Managed Care – PPO | Admitting: Diagnostic Neuroimaging

## 2021-01-12 ENCOUNTER — Other Ambulatory Visit: Payer: Self-pay

## 2021-01-12 DIAGNOSIS — Z6841 Body Mass Index (BMI) 40.0 and over, adult: Secondary | ICD-10-CM

## 2021-01-12 DIAGNOSIS — M79672 Pain in left foot: Secondary | ICD-10-CM

## 2021-01-12 DIAGNOSIS — M79671 Pain in right foot: Secondary | ICD-10-CM

## 2021-01-12 DIAGNOSIS — E662 Morbid (severe) obesity with alveolar hypoventilation: Secondary | ICD-10-CM

## 2021-01-12 DIAGNOSIS — Z0289 Encounter for other administrative examinations: Secondary | ICD-10-CM

## 2021-01-12 DIAGNOSIS — E8881 Metabolic syndrome: Secondary | ICD-10-CM

## 2021-01-12 NOTE — Procedures (Signed)
[facility]  NCS (NERVE CONDUCTION STUDY) WITH EMG (ELECTROMYOGRAPHY) REPORT   STUDY DATE: 01/12/21 PATIENT NAME: Desiree Dunn DOB: 1971-10-25 MRN: [id number]  ORDERING CLINICIAN: Larey Seat, MD   TECHNOLOGIST: Sherre Scarlet ELECTROMYOGRAPHER: Earlean Polka. Owen Pratte, MD  CLINICAL INFORMATION: 50 year old female with low back pain radiating to the right leg.  Right greater than left lower extremity numbness.  FINDINGS: NERVE CONDUCTION STUDY:  Bilateral peroneal and right tibial motor responses are normal.  Left tibial motor responses normal distal latency, borderline decreased amplitude and normal conduction velocity.  Bilateral superficial peroneal and sural sensory responses are normal.  Bilateral tibial F wave latencies are normal.   NEEDLE ELECTROMYOGRAPHY:  Needle examination of right lower extremity vastus medialis, tibialis anterior, gastrocnemius and lumbar paraspinal muscles are normal.  IMPRESSION:   Overall unremarkable nerve conduction study.  No definite evidence of underlying large fiber neuropathy.  Mild abnormality of left tibial motor response may be artifactual due to body habitus.  Needle EMG testing was normal.     INTERPRETING PHYSICIAN:  Penni Bombard, MD Certified in Neurology, Neurophysiology and Neuroimaging  [facility] [address] [place], [place] [address] [phone]  [facility]    Nerve / Sites Muscle Latency Ref. Amplitude Ref. Rel Amp Segments Distance Velocity Ref. Area    ms ms mV mV %  cm m/s m/s mVms  L Peroneal - EDB     Ankle EDB 2.1 ?6.5 6.5 ?2.0 100 Ankle - EDB 9   20.7     Fib head EDB 7.1  5.7  88.2 Fib head - Ankle 27 54 ?44 19.4     Pop fossa EDB 8.9  5.4  93.4 Pop fossa - Fib head 10 54 ?44 18.4         Pop fossa - Ankle      R Peroneal - EDB     Ankle EDB 3.0 ?6.5 5.8 ?2.0 100 Ankle - EDB 9   18.6     Fib head EDB 7.9  5.0  86 Fib head - Ankle 27 55 ?44 16.2      Pop fossa EDB 9.9  5.0  101 Pop fossa - Fib head 10 50 ?44 16.2         Pop fossa - Ankle      L Tibial - AH     Ankle AH 3.0 ?5.8 3.7 ?4.0 100 Ankle - AH 9   9.5     Pop fossa AH 11.4  3.7  99.9 Pop fossa - Ankle 36 43 ?41 10.9  R Tibial - AH     Ankle AH 3.8 ?5.8 4.1 ?4.0 100 Ankle - AH 9   9.4     Pop fossa AH 11.5  3.3  79.6 Pop fossa - Ankle 36 47 ?41 13.4             SNC    Nerve / Sites Rec. Site Peak Lat Ref.  Amp Ref. Segments Distance    ms ms V V  cm  L Sural - Ankle (Calf)     Calf Ankle 3.6 ?4.4 16 ?6 Calf - Ankle 14  R Sural - Ankle (Calf)     Calf Ankle 3.2 ?4.4 19 ?6 Calf - Ankle 14  L Superficial peroneal - Ankle     Lat leg Ankle 3.6 ?4.4 12 ?6 Lat leg - Ankle 14  R Superficial peroneal - Ankle     Lat leg Ankle 3.4 ?4.4 16 ?  6 Lat leg - Ankle 14             F  Wave    Nerve F Lat Ref.   ms ms  L Tibial - AH 48.5 ?56.0  R Tibial - AH 50.3 ?56.0         EMG Summary Table    Spontaneous MUAP Recruitment  Muscle IA Fib PSW Fasc Other Amp Dur. Poly Pattern  R. Vastus medialis Normal None None None _______ Normal Normal Normal Normal  R. Tibialis anterior Normal None None None _______ Normal Normal Normal Normal  R. Gastrocnemius (Medial head) Normal None None None _______ Normal Normal Normal Normal  R. Lumbar paraspinals (low) Normal None None None _______ Normal Normal Normal Normal

## 2021-01-16 NOTE — Progress Notes (Signed)
Needle examination of right lower extremity vastus medialis, tibialis anterior, gastrocnemius and lumbar paraspinal muscles are normal.  IMPRESSION:   Overall unremarkable nerve conduction study.  No definite evidence of underlying large fiber neuropathy.  Mild abnormality of left tibial motor response may be artifactual due to body habitus.  Needle EMG testing was normal.      INTERPRETING PHYSICIAN:  Penni Bombard, MD

## 2021-01-17 ENCOUNTER — Encounter: Payer: Self-pay | Admitting: Neurology

## 2021-01-18 DIAGNOSIS — N952 Postmenopausal atrophic vaginitis: Secondary | ICD-10-CM | POA: Diagnosis not present

## 2021-01-18 DIAGNOSIS — N9419 Other specified dyspareunia: Secondary | ICD-10-CM | POA: Diagnosis not present

## 2021-01-18 DIAGNOSIS — Z90710 Acquired absence of both cervix and uterus: Secondary | ICD-10-CM | POA: Diagnosis not present

## 2021-01-20 DIAGNOSIS — M41126 Adolescent idiopathic scoliosis, lumbar region: Secondary | ICD-10-CM | POA: Diagnosis not present

## 2021-01-20 DIAGNOSIS — M9902 Segmental and somatic dysfunction of thoracic region: Secondary | ICD-10-CM | POA: Diagnosis not present

## 2021-01-20 DIAGNOSIS — M9905 Segmental and somatic dysfunction of pelvic region: Secondary | ICD-10-CM | POA: Diagnosis not present

## 2021-01-20 DIAGNOSIS — M40292 Other kyphosis, cervical region: Secondary | ICD-10-CM | POA: Diagnosis not present

## 2021-01-20 DIAGNOSIS — M9903 Segmental and somatic dysfunction of lumbar region: Secondary | ICD-10-CM | POA: Diagnosis not present

## 2021-01-20 DIAGNOSIS — M9901 Segmental and somatic dysfunction of cervical region: Secondary | ICD-10-CM | POA: Diagnosis not present

## 2021-01-20 DIAGNOSIS — M5451 Vertebrogenic low back pain: Secondary | ICD-10-CM | POA: Diagnosis not present

## 2021-01-23 DIAGNOSIS — M9905 Segmental and somatic dysfunction of pelvic region: Secondary | ICD-10-CM | POA: Diagnosis not present

## 2021-01-23 DIAGNOSIS — M9903 Segmental and somatic dysfunction of lumbar region: Secondary | ICD-10-CM | POA: Diagnosis not present

## 2021-01-23 DIAGNOSIS — M546 Pain in thoracic spine: Secondary | ICD-10-CM | POA: Diagnosis not present

## 2021-01-23 DIAGNOSIS — M9901 Segmental and somatic dysfunction of cervical region: Secondary | ICD-10-CM | POA: Diagnosis not present

## 2021-01-23 DIAGNOSIS — M9902 Segmental and somatic dysfunction of thoracic region: Secondary | ICD-10-CM | POA: Diagnosis not present

## 2021-01-25 DIAGNOSIS — M9903 Segmental and somatic dysfunction of lumbar region: Secondary | ICD-10-CM | POA: Diagnosis not present

## 2021-01-25 DIAGNOSIS — M546 Pain in thoracic spine: Secondary | ICD-10-CM | POA: Diagnosis not present

## 2021-01-25 DIAGNOSIS — M9905 Segmental and somatic dysfunction of pelvic region: Secondary | ICD-10-CM | POA: Diagnosis not present

## 2021-01-25 DIAGNOSIS — M9902 Segmental and somatic dysfunction of thoracic region: Secondary | ICD-10-CM | POA: Diagnosis not present

## 2021-01-25 DIAGNOSIS — M9901 Segmental and somatic dysfunction of cervical region: Secondary | ICD-10-CM | POA: Diagnosis not present

## 2021-01-27 DIAGNOSIS — M9902 Segmental and somatic dysfunction of thoracic region: Secondary | ICD-10-CM | POA: Diagnosis not present

## 2021-01-27 DIAGNOSIS — M9903 Segmental and somatic dysfunction of lumbar region: Secondary | ICD-10-CM | POA: Diagnosis not present

## 2021-01-27 DIAGNOSIS — M546 Pain in thoracic spine: Secondary | ICD-10-CM | POA: Diagnosis not present

## 2021-01-27 DIAGNOSIS — M9905 Segmental and somatic dysfunction of pelvic region: Secondary | ICD-10-CM | POA: Diagnosis not present

## 2021-01-27 DIAGNOSIS — M9901 Segmental and somatic dysfunction of cervical region: Secondary | ICD-10-CM | POA: Diagnosis not present

## 2021-02-03 DIAGNOSIS — M9903 Segmental and somatic dysfunction of lumbar region: Secondary | ICD-10-CM | POA: Diagnosis not present

## 2021-02-03 DIAGNOSIS — M9901 Segmental and somatic dysfunction of cervical region: Secondary | ICD-10-CM | POA: Diagnosis not present

## 2021-02-03 DIAGNOSIS — M9905 Segmental and somatic dysfunction of pelvic region: Secondary | ICD-10-CM | POA: Diagnosis not present

## 2021-02-03 DIAGNOSIS — M546 Pain in thoracic spine: Secondary | ICD-10-CM | POA: Diagnosis not present

## 2021-02-03 DIAGNOSIS — M9902 Segmental and somatic dysfunction of thoracic region: Secondary | ICD-10-CM | POA: Diagnosis not present

## 2021-02-04 ENCOUNTER — Other Ambulatory Visit: Payer: Self-pay | Admitting: Internal Medicine

## 2021-02-06 DIAGNOSIS — M9902 Segmental and somatic dysfunction of thoracic region: Secondary | ICD-10-CM | POA: Diagnosis not present

## 2021-02-06 DIAGNOSIS — M9901 Segmental and somatic dysfunction of cervical region: Secondary | ICD-10-CM | POA: Diagnosis not present

## 2021-02-06 DIAGNOSIS — M9903 Segmental and somatic dysfunction of lumbar region: Secondary | ICD-10-CM | POA: Diagnosis not present

## 2021-02-06 DIAGNOSIS — M9905 Segmental and somatic dysfunction of pelvic region: Secondary | ICD-10-CM | POA: Diagnosis not present

## 2021-02-06 DIAGNOSIS — M546 Pain in thoracic spine: Secondary | ICD-10-CM | POA: Diagnosis not present

## 2021-02-07 ENCOUNTER — Ambulatory Visit: Payer: BC Managed Care – PPO | Admitting: Neurology

## 2021-02-08 DIAGNOSIS — M9902 Segmental and somatic dysfunction of thoracic region: Secondary | ICD-10-CM | POA: Diagnosis not present

## 2021-02-08 DIAGNOSIS — G4733 Obstructive sleep apnea (adult) (pediatric): Secondary | ICD-10-CM | POA: Diagnosis not present

## 2021-02-08 DIAGNOSIS — M9901 Segmental and somatic dysfunction of cervical region: Secondary | ICD-10-CM | POA: Diagnosis not present

## 2021-02-08 DIAGNOSIS — M546 Pain in thoracic spine: Secondary | ICD-10-CM | POA: Diagnosis not present

## 2021-02-08 DIAGNOSIS — M9903 Segmental and somatic dysfunction of lumbar region: Secondary | ICD-10-CM | POA: Diagnosis not present

## 2021-02-08 DIAGNOSIS — M9905 Segmental and somatic dysfunction of pelvic region: Secondary | ICD-10-CM | POA: Diagnosis not present

## 2021-02-09 DIAGNOSIS — M65331 Trigger finger, right middle finger: Secondary | ICD-10-CM | POA: Diagnosis not present

## 2021-02-13 DIAGNOSIS — M9903 Segmental and somatic dysfunction of lumbar region: Secondary | ICD-10-CM | POA: Diagnosis not present

## 2021-02-13 DIAGNOSIS — M9905 Segmental and somatic dysfunction of pelvic region: Secondary | ICD-10-CM | POA: Diagnosis not present

## 2021-02-13 DIAGNOSIS — M546 Pain in thoracic spine: Secondary | ICD-10-CM | POA: Diagnosis not present

## 2021-02-13 DIAGNOSIS — M9902 Segmental and somatic dysfunction of thoracic region: Secondary | ICD-10-CM | POA: Diagnosis not present

## 2021-02-13 DIAGNOSIS — M9901 Segmental and somatic dysfunction of cervical region: Secondary | ICD-10-CM | POA: Diagnosis not present

## 2021-02-15 ENCOUNTER — Telehealth: Payer: Self-pay | Admitting: Urology

## 2021-02-15 ENCOUNTER — Ambulatory Visit (INDEPENDENT_AMBULATORY_CARE_PROVIDER_SITE_OTHER): Payer: BC Managed Care – PPO

## 2021-02-15 ENCOUNTER — Other Ambulatory Visit: Payer: Self-pay

## 2021-02-15 ENCOUNTER — Encounter: Payer: Self-pay | Admitting: Internal Medicine

## 2021-02-15 ENCOUNTER — Encounter: Payer: Self-pay | Admitting: Adult Health

## 2021-02-15 ENCOUNTER — Encounter: Payer: Self-pay | Admitting: Podiatry

## 2021-02-15 ENCOUNTER — Ambulatory Visit (INDEPENDENT_AMBULATORY_CARE_PROVIDER_SITE_OTHER): Payer: BC Managed Care – PPO | Admitting: Podiatry

## 2021-02-15 DIAGNOSIS — M2042 Other hammer toe(s) (acquired), left foot: Secondary | ICD-10-CM

## 2021-02-15 NOTE — Progress Notes (Signed)
Subjective:   Patient ID: Desiree Dunn, female   DOB: 50 y.o.   MRN: [id number]   HPI Patient presents stating the pain in my fourth toe has not gone away and continuing to give me trouble.  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  At this point I went ahead and I discussed arthroplasty allowed her to read a consent form reviewed it and explained alternative treatments complications.  Patient wants surgery understanding everything as listed and at this point patient signed consent form scheduled for outpatient surgery encouraged to call with questions and this will be done on outpatient basis at surgical center  X-rays indicated that the area of irritation is on the lateral side of the head of the proximal phalanx remaining fourth digit left foot

## 2021-02-15 NOTE — Telephone Encounter (Addendum)
DOS - 03/07/21  HAMMERTOE REPAIR 4TH LEFT --- [id number]  [employer] EFFECTIVE DATE - 02/13/15   SPOKE WITH BRANDY WITH [employer] ANTHEM AND SHE STATED THAT NO PRIOR AUTH IS REQUIRED FOR CPT CODE 79480.  REF # [id number]

## 2021-02-17 DIAGNOSIS — M9902 Segmental and somatic dysfunction of thoracic region: Secondary | ICD-10-CM | POA: Diagnosis not present

## 2021-02-17 DIAGNOSIS — M9905 Segmental and somatic dysfunction of pelvic region: Secondary | ICD-10-CM | POA: Diagnosis not present

## 2021-02-17 DIAGNOSIS — M9903 Segmental and somatic dysfunction of lumbar region: Secondary | ICD-10-CM | POA: Diagnosis not present

## 2021-02-17 DIAGNOSIS — M546 Pain in thoracic spine: Secondary | ICD-10-CM | POA: Diagnosis not present

## 2021-02-17 DIAGNOSIS — M9901 Segmental and somatic dysfunction of cervical region: Secondary | ICD-10-CM | POA: Diagnosis not present

## 2021-02-20 DIAGNOSIS — M9901 Segmental and somatic dysfunction of cervical region: Secondary | ICD-10-CM | POA: Diagnosis not present

## 2021-02-20 DIAGNOSIS — M546 Pain in thoracic spine: Secondary | ICD-10-CM | POA: Diagnosis not present

## 2021-02-20 DIAGNOSIS — M9905 Segmental and somatic dysfunction of pelvic region: Secondary | ICD-10-CM | POA: Diagnosis not present

## 2021-02-20 DIAGNOSIS — M9903 Segmental and somatic dysfunction of lumbar region: Secondary | ICD-10-CM | POA: Diagnosis not present

## 2021-02-20 DIAGNOSIS — M9902 Segmental and somatic dysfunction of thoracic region: Secondary | ICD-10-CM | POA: Diagnosis not present

## 2021-02-21 ENCOUNTER — Encounter: Payer: Self-pay | Admitting: Nurse Practitioner

## 2021-02-21 ENCOUNTER — Other Ambulatory Visit: Payer: Self-pay

## 2021-02-21 ENCOUNTER — Ambulatory Visit: Payer: BC Managed Care – PPO | Admitting: Nurse Practitioner

## 2021-02-21 VITALS — BP 132/80 | HR 84 | Temp 97.9°F | Ht 64.8 in | Wt 240.4 lb

## 2021-02-21 DIAGNOSIS — M545 Low back pain, unspecified: Secondary | ICD-10-CM

## 2021-02-21 DIAGNOSIS — M419 Scoliosis, unspecified: Secondary | ICD-10-CM | POA: Diagnosis not present

## 2021-02-21 DIAGNOSIS — Z6841 Body Mass Index (BMI) 40.0 and over, adult: Secondary | ICD-10-CM | POA: Diagnosis not present

## 2021-02-21 DIAGNOSIS — G8929 Other chronic pain: Secondary | ICD-10-CM

## 2021-02-21 NOTE — Progress Notes (Signed)
I,Yamilka [employer] as a Education administrator for [employer], FNP.,have documented all relevant documentation on the behalf of Minette Brine, FNP,as directed by  Minette Brine, FNP while in the presence of Minette Brine, [place]. This visit occurred during the SARS-CoV-2 public health emergency.  Safety protocols were in place, including screening questions prior to the visit, additional usage of staff PPE, and extensive cleaning of exam room while observing appropriate contact time as indicated for disinfecting solutions.  Subjective:     Patient ID: Desiree Dunn , female    DOB: 1971-02-05 , 50 y.o.   MRN: [id number]   Chief Complaint  Patient presents with  . Referral    Patient stated she has scoliosis in her back and she would like to see a specialist about it.     HPI  Patient presents today for a referral. She stated she stated she has scoliosis and she has been having back pain. She has been going to a chiropractor who reported she has scoliosis. She continues to have low back pain. She has been to physical therapy. She has had a nerve conduction done for bilateral legs at [facility].   She is also having muscular pain.     Wt Readings from Last 3 Encounters: 02/21/21 : 240 lb 6.4 oz (109 kg) 12/15/20 : 238 lb (108 kg) 12/12/20 : 233 lb (105.7 kg)        Past Medical History:  Diagnosis Date  . Allergy   . Anxiety   . Back pain   . Chronic RLQ pain    since 1995 when she had ectopic preg  . Constipation   . Deviated septum   . Diverticulitis   . Ectopic pregnancy 1995    x 1 , R side   . Eczema   . GERD (gastroesophageal reflux disease)   . HTN (hypertension) 05/24/2015  . Migraine without aura and without status migrainosus, not intractable   . Miscarriage    x 1  . Obesity, unspecified 07/06/2010  . Pelvic congestion    h/o   . Sleep apnea    uses cpap     Family History  Problem Relation Age of Onset  . Colon cancer Mother 71  . Colon cancer Maternal Aunt 37   . Stroke Father   . Diabetes Father   . Hypertension Father   . Hyperlipidemia Father   . Heart disease Father   . Obesity Father   . Hypertension Sister   . Colon polyps Brother   . Colitis Brother   . Depression Son   . Asperger's syndrome Son   . Breast cancer Paternal Grandmother   . Prostate cancer Brother   . Heart attack Neg Hx   . Esophageal cancer Neg Hx   . Rectal cancer Neg Hx   . Stomach cancer Neg Hx      Current Outpatient Medications:  .  acetaminophen (TYLENOL) 500 MG tablet, Take 1,000 mg by mouth as needed for mild pain or headache., Disp: , Rfl:  .  albuterol (VENTOLIN HFA) 108 (90 Base) MCG/ACT inhaler, TAKE 2 PUFFS BY MOUTH EVERY 4 HOURS AS NEEDED, Disp: 18 g, Rfl: 1 .  Azelastine-Fluticasone 137-50 MCG/ACT SUSP, Place 1 spray into the nose 2 (two) times daily., Disp: 23 g, Rfl: 5 .  EPINEPHrine (EPIPEN 2-PAK) 0.3 mg/0.3 mL IJ SOAJ injection, Inject 0.3 mg into the muscle as needed for anaphylaxis., Disp: 2 each, Rfl: 2 .  hydrochlorothiazide (MICROZIDE) 12.5 MG capsule, Take 1 capsule (  12.5 mg total) by mouth daily., Disp: 90 capsule, Rfl: 3 .  ibuprofen (ADVIL,MOTRIN) 200 MG tablet, Take 400 mg by mouth as needed for moderate pain., Disp: , Rfl:  .  montelukast (SINGULAIR) 10 MG tablet, TAKE 1 TABLET BY MOUTH EVERYDAY AT BEDTIME, Disp: 90 tablet, Rfl: 1 .  olmesartan (BENICAR) 40 MG tablet, TAKE 1 TABLET BY MOUTH EVERY DAY, Disp: 90 tablet, Rfl: 1 .  Olopatadine HCl (PAZEO) 0.7 % SOLN, Place 1 drop into both eyes 1 day or 1 dose. (Patient taking differently: Place 1 drop into both eyes 1 day or 1 dose. As needed), Disp: 2.5 mL, Rfl: 5 .  Omega-3 Fatty Acids (OMEGA 3 PO), Take by mouth. Some days, Disp: , Rfl:  .  Probiotic Product (PROBIOTIC DAILY PO), Take by mouth., Disp: , Rfl:  .  TURMERIC PO, Take by mouth. AS NEEDED, Disp: , Rfl:  .  verapamil (CALAN-SR) 120 MG CR tablet, TAKE 1 TABLET (120 MG TOTAL) BY MOUTH DAILY., Disp: 90 tablet, Rfl: 1    Allergies  Allergen Reactions  . Amoxicillin Hives    Hives     Review of Systems  Constitutional: Negative.   Respiratory: Negative.   Cardiovascular: Negative.  Negative for chest pain, palpitations and leg swelling.  Gastrointestinal:       Denies bowel incontinence  Genitourinary:       Denies urinary incontinence  Musculoskeletal: Positive for back pain (for several years, found out she has scoliosis 1 month ago. ).       She reports some challenges with walking. When she does too much she will stop walking. She has mostly right side discomfort.     Today's Vitals   02/21/21 1518  BP: 132/80  Pulse: 84  Temp: 97.9 F (36.6 C)  TempSrc: Oral  SpO2: 97%  Weight: 240 lb 6.4 oz (109 kg)  Height: 5' 4.8" (1.646 m)  PainSc: 8   PainLoc: Back   Body mass index is 40.25 kg/m.   Objective:  Physical Exam Vitals reviewed.  Constitutional:      General: She is not in acute distress.    Appearance: Normal appearance.  Cardiovascular:     Rate and Rhythm: Normal rate and regular rhythm.     Pulses: Normal pulses.     Heart sounds: Normal heart sounds. No murmur heard.   Pulmonary:     Effort: Pulmonary effort is normal. No respiratory distress.     Breath sounds: Normal breath sounds.  Musculoskeletal:     Comments: Mild noted curvature to thoracic spine area  Neurological:     General: No focal deficit present.     Mental Status: She is alert and oriented to person, place, and time.     Cranial Nerves: No cranial nerve deficit.  Psychiatric:        Mood and Affect: Mood normal.        Behavior: Behavior normal.        Thought Content: Thought content normal.        Judgment: Judgment normal.         Assessment And Plan:     1. Scoliosis, unspecified scoliosis type, unspecified spinal region  She was advised she has scoliosis from the chiropractor she would like to be evaluated by orthopedics to see if there is anything to be done - Ambulatory referral  to Orthopedic Surgery  2. Chronic bilateral low back pain without sciatica - Ambulatory referral to Orthopedic Surgery  3. BMI  40.0-44.9, adult ([place])  Chronic  Discussed healthy diet and regular exercise options   Encouraged to exercise at least 150 minutes per week with 2 days of strength training   I advised her increased weight can affect her back pain as well     Patient was given opportunity to ask questions. Patient verbalized understanding of the plan and was able to repeat key elements of the plan. All questions were answered to their satisfaction.  Minette Brine, FNP   I, Minette Brine, FNP, have reviewed all documentation for this visit. The documentation on 02/28/21 for the exam, diagnosis, procedures, and orders are all accurate and complete.   IF YOU HAVE BEEN REFERRED TO A SPECIALIST, IT MAY TAKE 1-2 WEEKS TO SCHEDULE/PROCESS THE REFERRAL. IF YOU HAVE NOT HEARD FROM US/SPECIALIST IN TWO WEEKS, PLEASE GIVE [place] A CALL AT [phone].   THE PATIENT IS ENCOURAGED TO PRACTICE SOCIAL DISTANCING DUE TO THE COVID-19 PANDEMIC.

## 2021-03-07 ENCOUNTER — Encounter: Payer: Self-pay | Admitting: Podiatry

## 2021-03-07 DIAGNOSIS — L84 Corns and callosities: Secondary | ICD-10-CM | POA: Diagnosis not present

## 2021-03-07 DIAGNOSIS — M2042 Other hammer toe(s) (acquired), left foot: Secondary | ICD-10-CM | POA: Diagnosis not present

## 2021-03-13 ENCOUNTER — Other Ambulatory Visit: Payer: Self-pay

## 2021-03-13 ENCOUNTER — Ambulatory Visit (INDEPENDENT_AMBULATORY_CARE_PROVIDER_SITE_OTHER): Payer: BC Managed Care – PPO | Admitting: Podiatry

## 2021-03-13 ENCOUNTER — Encounter: Payer: Self-pay | Admitting: Podiatry

## 2021-03-13 ENCOUNTER — Ambulatory Visit (INDEPENDENT_AMBULATORY_CARE_PROVIDER_SITE_OTHER): Payer: BC Managed Care – PPO

## 2021-03-13 DIAGNOSIS — M2042 Other hammer toe(s) (acquired), left foot: Secondary | ICD-10-CM

## 2021-03-14 ENCOUNTER — Ambulatory Visit: Payer: BC Managed Care – PPO | Admitting: Adult Health

## 2021-03-14 NOTE — Progress Notes (Signed)
Subjective:   Patient ID: Desiree Dunn, female   DOB: 50 y.o.   MRN: [id number]   HPI Patient presents stating doing very well with surgery with minimal discomfort very pleased   ROS      Objective:  Physical Exam  Neurovascular status intact negative [name]' sign noted fourth digit left healing well wound edges well coapted good position of the toe     Assessment:  Doing well post arthroplasty digit 4 left     Plan:  H&P x-ray reviewed recommended 2 more weeks of elevation immobilization and reappoint suture removal or earlier if needed  X-rays indicate satisfactory resection of bone good alignment fourth digit current

## 2021-03-21 ENCOUNTER — Other Ambulatory Visit: Payer: Self-pay | Admitting: Internal Medicine

## 2021-03-21 DIAGNOSIS — Z1231 Encounter for screening mammogram for malignant neoplasm of breast: Secondary | ICD-10-CM

## 2021-03-21 DIAGNOSIS — M545 Low back pain, unspecified: Secondary | ICD-10-CM | POA: Diagnosis not present

## 2021-03-23 DIAGNOSIS — M65331 Trigger finger, right middle finger: Secondary | ICD-10-CM | POA: Diagnosis not present

## 2021-03-27 ENCOUNTER — Ambulatory Visit (INDEPENDENT_AMBULATORY_CARE_PROVIDER_SITE_OTHER): Payer: BC Managed Care – PPO

## 2021-03-27 ENCOUNTER — Ambulatory Visit: Payer: BC Managed Care – PPO

## 2021-03-27 ENCOUNTER — Other Ambulatory Visit: Payer: Self-pay

## 2021-03-27 DIAGNOSIS — Z9889 Other specified postprocedural states: Secondary | ICD-10-CM

## 2021-03-27 NOTE — Progress Notes (Signed)
Patient in office today for suture removal in the 4th digit left foot. Sutures removed without complication and x-rays obtain. X-ray sent to provider for review. Patient denies nausea, vomiting, fever, chills and pain at this time. Patient advised to call the office with any comments, questions or concerns.

## 2021-04-03 DIAGNOSIS — M5459 Other low back pain: Secondary | ICD-10-CM | POA: Diagnosis not present

## 2021-04-03 DIAGNOSIS — M25551 Pain in right hip: Secondary | ICD-10-CM | POA: Diagnosis not present

## 2021-04-12 DIAGNOSIS — M25551 Pain in right hip: Secondary | ICD-10-CM | POA: Diagnosis not present

## 2021-04-12 DIAGNOSIS — M5459 Other low back pain: Secondary | ICD-10-CM | POA: Diagnosis not present

## 2021-04-21 DIAGNOSIS — M5459 Other low back pain: Secondary | ICD-10-CM | POA: Diagnosis not present

## 2021-04-21 DIAGNOSIS — M25551 Pain in right hip: Secondary | ICD-10-CM | POA: Diagnosis not present

## 2021-04-27 DIAGNOSIS — M5459 Other low back pain: Secondary | ICD-10-CM | POA: Diagnosis not present

## 2021-04-27 DIAGNOSIS — M25551 Pain in right hip: Secondary | ICD-10-CM | POA: Diagnosis not present

## 2021-04-28 ENCOUNTER — Other Ambulatory Visit: Payer: Self-pay | Admitting: Internal Medicine

## 2021-04-28 DIAGNOSIS — M65331 Trigger finger, right middle finger: Secondary | ICD-10-CM | POA: Diagnosis not present

## 2021-04-28 HISTORY — PX: TRIGGER FINGER RELEASE: SHX641

## 2021-05-02 DIAGNOSIS — M25551 Pain in right hip: Secondary | ICD-10-CM | POA: Diagnosis not present

## 2021-05-02 DIAGNOSIS — M5459 Other low back pain: Secondary | ICD-10-CM | POA: Diagnosis not present

## 2021-05-08 ENCOUNTER — Encounter: Payer: Self-pay | Admitting: Internal Medicine

## 2021-05-08 ENCOUNTER — Ambulatory Visit (INDEPENDENT_AMBULATORY_CARE_PROVIDER_SITE_OTHER): Payer: BC Managed Care – PPO | Admitting: Internal Medicine

## 2021-05-08 ENCOUNTER — Other Ambulatory Visit: Payer: Self-pay

## 2021-05-08 VITALS — BP 124/76 | HR 86 | Temp 98.6°F | Ht 66.2 in | Wt 243.2 lb

## 2021-05-08 DIAGNOSIS — Z79899 Other long term (current) drug therapy: Secondary | ICD-10-CM | POA: Diagnosis not present

## 2021-05-08 DIAGNOSIS — R7309 Other abnormal glucose: Secondary | ICD-10-CM | POA: Diagnosis not present

## 2021-05-08 DIAGNOSIS — Z0000 Encounter for general adult medical examination without abnormal findings: Secondary | ICD-10-CM | POA: Diagnosis not present

## 2021-05-08 DIAGNOSIS — Z23 Encounter for immunization: Secondary | ICD-10-CM | POA: Diagnosis not present

## 2021-05-08 DIAGNOSIS — Z6839 Body mass index (BMI) 39.0-39.9, adult: Secondary | ICD-10-CM

## 2021-05-08 DIAGNOSIS — I10 Essential (primary) hypertension: Secondary | ICD-10-CM | POA: Diagnosis not present

## 2021-05-08 LAB — POCT URINALYSIS DIPSTICK
Bilirubin, UA: NEGATIVE
Blood, UA: NEGATIVE
Glucose, UA: NEGATIVE
Ketones, UA: NEGATIVE
Leukocytes, UA: NEGATIVE
Nitrite, UA: NEGATIVE
Protein, UA: NEGATIVE
Spec Grav, UA: 1.02 (ref 1.010–1.025)
Urobilinogen, UA: 0.2 E.U./dL
pH, UA: 6 (ref 5.0–8.0)

## 2021-05-08 LAB — POCT UA - MICROALBUMIN
Albumin/Creatinine Ratio, Urine, POC: <30
Creatinine, POC: 200 mg/dL
Microalbumin Ur, POC: 10 mg/L

## 2021-05-08 MED ORDER — SHINGRIX 50 MCG/0.5ML IM SUSR: 0.5000 mL | Freq: Once | INTRAMUSCULAR | 0 refills | Status: AC

## 2021-05-08 NOTE — Progress Notes (Signed)
I,[place],acting as a Education administrator for [name], MD.,have documented all relevant documentation on the behalf of [name], MD,as directed by  [name], MD while in the presence of [name], MD.  This visit occurred during the SARS-CoV-2 public health emergency.  Safety protocols were in place, including screening questions prior to the visit, additional usage of staff PPE, and extensive cleaning of exam room while observing appropriate contact time as indicated for disinfecting solutions.  Subjective:     Patient ID: Desiree Dunn , female    DOB: [birth year] , 50 y.o.   MRN: [id number]   Chief Complaint  Patient presents with   Annual Exam   Hypertension    HPI  She is here today for a full physical exam. She is followed by Dr. Garwin Brothers for her GYN care. Patient states vaginal exam was earlier this year and she will be getting her mammogram tomorrow. She had trigger finger surgery on June 17th. States she is doing well.   Hypertension This is a chronic problem. The current episode started more than 1 year ago. The problem has been gradually improving since onset. The problem is controlled. Pertinent negatives include no blurred vision, chest pain, palpitations or shortness of breath. Risk factors for coronary artery disease include obesity. Past treatments include ACE inhibitors. The current treatment provides moderate improvement. Compliance problems include exercise.     Past Medical History:  Diagnosis Date   Allergy    Anxiety    Back pain    Chronic RLQ pain    since 1995 when she had ectopic preg   Constipation    Deviated septum    Diverticulitis    Ectopic pregnancy 1995    x 1 , R side    Eczema    GERD (gastroesophageal reflux disease)    HTN (hypertension) 05/24/2015   Migraine without aura and without status migrainosus, not intractable    Miscarriage    x 1   Obesity, unspecified 07/06/2010   Pelvic congestion    h/o    Sleep  apnea    uses cpap     Family History  Problem Relation Age of Onset   Colon cancer Mother 51   Colon cancer Maternal Aunt 14   Stroke Father    Diabetes Father    Hypertension Father    Hyperlipidemia Father    Heart disease Father    Obesity Father    Hypertension Sister    Colon polyps Brother    Colitis Brother    Depression Son    Asperger's syndrome Son    Breast cancer Paternal Grandmother    Prostate cancer Brother    Heart attack Neg Hx    Esophageal cancer Neg Hx    Rectal cancer Neg Hx    Stomach cancer Neg Hx      Current Outpatient Medications:    acetaminophen (TYLENOL) 500 MG tablet, Take 1,000 mg by mouth as needed for mild pain or headache., Disp: , Rfl:    albuterol (VENTOLIN HFA) 108 (90 Base) MCG/ACT inhaler, TAKE 2 PUFFS BY MOUTH EVERY 4 HOURS AS NEEDED, Disp: 18 g, Rfl: 1   Azelastine-Fluticasone 137-50 MCG/ACT SUSP, Place 1 spray into the nose 2 (two) times daily., Disp: 23 g, Rfl: 5   EPINEPHrine (EPIPEN 2-PAK) 0.3 mg/0.3 mL IJ SOAJ injection, Inject 0.3 mg into the muscle as needed for anaphylaxis., Disp: 2 each, Rfl: 2   hydrochlorothiazide (MICROZIDE) 12.5 MG capsule, Take 1  capsule (12.5 mg total) by mouth daily., Disp: 90 capsule, Rfl: 3   ibuprofen (ADVIL,MOTRIN) 200 MG tablet, Take 400 mg by mouth as needed for moderate pain., Disp: , Rfl:    montelukast (SINGULAIR) 10 MG tablet, TAKE 1 TABLET BY MOUTH EVERYDAY AT BEDTIME, Disp: 90 tablet, Rfl: 1   olmesartan (BENICAR) 40 MG tablet, TAKE 1 TABLET BY MOUTH EVERY DAY, Disp: 90 tablet, Rfl: 1   Olopatadine HCl (PAZEO) 0.7 % SOLN, Place 1 drop into both eyes 1 day or 1 dose. (Patient taking differently: Place 1 drop into both eyes 1 day or 1 dose. As needed), Disp: 2.5 mL, Rfl: 5   Probiotic Product (PROBIOTIC DAILY PO), Take by mouth., Disp: , Rfl:    TURMERIC PO, Take by mouth. AS NEEDED, Disp: , Rfl:    verapamil (CALAN-SR) 120 MG CR tablet, TAKE 1 TABLET (120 MG TOTAL) BY MOUTH DAILY., Disp: 90  tablet, Rfl: 1   Allergies  Allergen Reactions   Amoxicillin Hives    Hives      The patient states she uses status post hysterectomy for birth control. Last LMP was No LMP recorded. Patient has had a hysterectomy.. Negative for Dysmenorrhea. Negative for: breast discharge, breast lump(s), breast pain and breast self exam. Associated symptoms include abnormal vaginal bleeding. Pertinent negatives include abnormal bleeding (hematology), anxiety, decreased libido, depression, difficulty falling sleep, dyspareunia, history of infertility, nocturia, sexual dysfunction, sleep disturbances, urinary incontinence, urinary urgency, vaginal discharge and vaginal itching. Diet regular.The patient states her exercise level is  intermittent. Currently in PT for back pain.  . The patient's tobacco use is:  Social History   Tobacco Use  Smoking Status Never  Smokeless Tobacco Never  . She has been exposed to passive smoke. The patient's alcohol use is:  Social History   Substance and Sexual Activity  Alcohol Use Yes   Alcohol/week: 0.0 standard drinks   Comment: socially - 1 drink every 3 months    Review of Systems  Constitutional: Negative.   HENT: Negative.    Eyes: Negative.  Negative for blurred vision.  Respiratory: Negative.  Negative for shortness of breath.   Cardiovascular: Negative.  Negative for chest pain and palpitations.  Gastrointestinal: Negative.   Endocrine: Negative.   Genitourinary: Negative.   Musculoskeletal: Negative.   Skin: Negative.   Allergic/Immunologic: Negative.   Neurological: Negative.   Hematological: Negative.   Psychiatric/Behavioral: Negative.      Today's Vitals   05/08/21 0906  BP: 124/76  Pulse: 86  Temp: 98.6 F (37 C)  TempSrc: Oral  Weight: 243 lb 3.2 oz (110.3 kg)  Height: 5' 6.2" (1.681 m)   Body mass index is 39.02 kg/m.  Wt Readings from Last 3 Encounters:  05/08/21 243 lb 3.2 oz (110.3 kg)  02/21/21 240 lb 6.4 oz (109 kg)   12/15/20 238 lb (108 kg)    BP Readings from Last 3 Encounters:  05/08/21 124/76  02/21/21 132/80  12/15/20 102/72    Objective:  Physical Exam Vitals and nursing note reviewed.  Constitutional:      Appearance: Normal appearance. She is obese.  HENT:     Head: Normocephalic and atraumatic.     Right Ear: Tympanic membrane, ear canal and external ear normal.     Left Ear: Tympanic membrane, ear canal and external ear normal.     Nose:     Comments: Masked     Mouth/Throat:     Comments: Masked  Eyes:  Extraocular Movements: Extraocular movements intact.     Conjunctiva/sclera: Conjunctivae normal.     Pupils: Pupils are equal, round, and reactive to light.  Cardiovascular:     Rate and Rhythm: Normal rate and regular rhythm.     Pulses: Normal pulses.     Heart sounds: Normal heart sounds.  Pulmonary:     Effort: Pulmonary effort is normal.     Breath sounds: Normal breath sounds.  Abdominal:     General: Abdomen is flat. Bowel sounds are normal.     Palpations: Abdomen is soft.  Genitourinary:    Comments: deferred Musculoskeletal:        General: Normal range of motion.     Cervical back: Normal range of motion and neck supple.     Comments: Right hand is bandaged.   Skin:    General: Skin is warm and dry.  Neurological:     General: No focal deficit present.     Mental Status: She is alert and oriented to person, place, and time.  Psychiatric:        Mood and Affect: Mood normal.        Behavior: Behavior normal.        Assessment And Plan:     1. Routine general medical examination at a health care facility Comments: A full exam was performed. Importance of monthly self breast exams was discussed with the patient. PATIENT IS ADVISED TO GET 30-45 MINUTES REGULAR EXERCISE NO LESS THAN FOUR TO FIVE DAYS PER WEEK - BOTH WEIGHTBEARING EXERCISES AND AEROBIC ARE RECOMMENDED.  PATIENT IS ADVISED TO FOLLOW A HEALTHY DIET WITH AT LEAST SIX FRUITS/VEGGIES PER DAY,  DECREASE INTAKE OF RED MEAT, AND TO INCREASE FISH INTAKE TO TWO DAYS PER WEEK.  MEATS/FISH SHOULD NOT BE FRIED, BAKED OR BROILED IS PREFERABLE.  IT IS ALSO IMPORTANT TO CUT BACK ON YOUR SUGAR INTAKE. PLEASE AVOID ANYTHING WITH ADDED SUGAR, CORN SYRUP OR OTHER SWEETENERS. IF YOU MUST USE A SWEETENER, YOU CAN TRY STEVIA. IT IS ALSO IMPORTANT TO AVOID ARTIFICIALLY SWEETENERS AND DIET BEVERAGES. LASTLY, I SUGGEST WEARING SPF 50 SUNSCREEN ON EXPOSED PARTS AND ESPECIALLY WHEN IN THE DIRECT SUNLIGHT FOR AN EXTENDED PERIOD OF TIME.  PLEASE AVOID FAST FOOD RESTAURANTS AND INCREASE YOUR WATER INTAKE.  - Hemoglobin A1c - CBC - CMP14+EGFR - Lipid panel - Insulin, random(561)  2. Essential hypertension Comments: Chronic, controlled. EKG performed, NSR w/ low voltage with rightward P-axis and rotation -possible pulmonary disease. I will not make any medication changes. Encouraged to follow low sodium diet. Advised to limit sodium intake.  - POCT UA - Microalbumin - POCT Urinalysis Dipstick (81002) - EKG 12-Lead  3. Class 2 severe obesity due to excess calories with serious comorbidity and body mass index (BMI) of 39.0 to 39.9 in adult [facility]) Comments: She is encouraged to strive to lose ten percent of her body weight (24 lbs) to decrease cardiac risk. Advised to aim for at least 150 minutes of exercise/week.   4. Immunization due Comments: I will send rx Shingrix to her local pharmacy.   Patient was given opportunity to ask questions. Patient verbalized understanding of the plan and was able to repeat key elements of the plan. All questions were answered to their satisfaction.   I, [name], MD, have reviewed all documentation for this visit. The documentation on 05/08/21 for the exam, diagnosis, procedures, and orders are all accurate and complete.  THE PATIENT IS ENCOURAGED TO PRACTICE SOCIAL DISTANCING DUE TO  THE COVID-19 PANDEMIC.

## 2021-05-09 ENCOUNTER — Ambulatory Visit
Admission: RE | Admit: 2021-05-09 | Discharge: 2021-05-09 | Disposition: A | Discharge status: Home / Self Care | Payer: BC Managed Care – PPO | Source: Ambulatory Visit

## 2021-05-09 DIAGNOSIS — Z1231 Encounter for screening mammogram for malignant neoplasm of breast: Secondary | ICD-10-CM | POA: Diagnosis not present

## 2021-05-09 DIAGNOSIS — G4733 Obstructive sleep apnea (adult) (pediatric): Secondary | ICD-10-CM | POA: Diagnosis not present

## 2021-05-09 LAB — CBC
Hematocrit: 39.6 % (ref 34.0–46.6)
Hemoglobin: 13.1 g/dL (ref 11.1–15.9)
MCH: 30 pg (ref 26.6–33.0)
MCHC: 33.1 g/dL (ref 31.5–35.7)
MCV: 91 fL (ref 79–97)
Platelets: 246 10*3/uL (ref 150–450)
RBC: 4.37 x10E6/uL (ref 3.77–5.28)
RDW: 13 % (ref 11.7–15.4)
WBC: 4.5 10*3/uL (ref 3.4–10.8)

## 2021-05-09 LAB — CMP14+EGFR
ALT: 61 IU/L — ABNORMAL HIGH (ref 0–32)
AST: 41 IU/L — ABNORMAL HIGH (ref 0–40)
Albumin/Globulin Ratio: 1.8 (ref 1.2–2.2)
Albumin: 4.8 g/dL (ref 3.8–4.9)
Alkaline Phosphatase: 77 IU/L (ref 44–121)
BUN/Creatinine Ratio: 14 (ref 9–23)
BUN: 14 mg/dL (ref 6–24)
Bilirubin Total: 0.3 mg/dL (ref 0.0–1.2)
CO2: 24 mmol/L (ref 20–29)
Calcium: 9.7 mg/dL (ref 8.7–10.2)
Chloride: 100 mmol/L (ref 96–106)
Creatinine, Ser: 0.99 mg/dL (ref 0.57–1.00)
Globulin, Total: 2.7 g/dL (ref 1.5–4.5)
Glucose: 90 mg/dL (ref 65–99)
Potassium: 4.4 mmol/L (ref 3.5–5.2)
Sodium: 138 mmol/L (ref 134–144)
Total Protein: 7.5 g/dL (ref 6.0–8.5)
eGFR: 69 mL/min/{1.73_m2} (ref >59)

## 2021-05-09 LAB — LIPID PANEL
Chol/HDL Ratio: 3.3 ratio (ref 0.0–4.4)
Cholesterol, Total: 199 mg/dL (ref 100–199)
HDL: 60 mg/dL (ref >39)
LDL Chol Calc (NIH): 116 mg/dL — ABNORMAL HIGH (ref 0–99)
Triglycerides: 132 mg/dL (ref 0–149)
VLDL Cholesterol Cal: 23 mg/dL (ref 5–40)

## 2021-05-09 LAB — HEMOGLOBIN A1C
Est. average glucose Bld gHb Est-mCnc: 126 mg/dL
Hgb A1c MFr Bld: 6 % — ABNORMAL HIGH (ref 4.8–5.6)

## 2021-05-09 LAB — INSULIN, RANDOM: INSULIN: 31.1 u[IU]/mL — ABNORMAL HIGH (ref 2.6–24.9)

## 2021-05-10 DIAGNOSIS — M25551 Pain in right hip: Secondary | ICD-10-CM | POA: Diagnosis not present

## 2021-05-10 DIAGNOSIS — M5459 Other low back pain: Secondary | ICD-10-CM | POA: Diagnosis not present

## 2021-05-17 ENCOUNTER — Other Ambulatory Visit: Payer: Self-pay

## 2021-05-17 ENCOUNTER — Ambulatory Visit (INDEPENDENT_AMBULATORY_CARE_PROVIDER_SITE_OTHER): Payer: BC Managed Care – PPO | Admitting: Plastic Surgery

## 2021-05-17 ENCOUNTER — Encounter: Payer: Self-pay | Admitting: Plastic Surgery

## 2021-05-17 VITALS — BP 119/70 | HR 89 | Ht 64.0 in | Wt 241.8 lb

## 2021-05-17 DIAGNOSIS — M25551 Pain in right hip: Secondary | ICD-10-CM | POA: Diagnosis not present

## 2021-05-17 DIAGNOSIS — M793 Panniculitis, unspecified: Secondary | ICD-10-CM | POA: Diagnosis not present

## 2021-05-17 DIAGNOSIS — M5459 Other low back pain: Secondary | ICD-10-CM | POA: Diagnosis not present

## 2021-05-17 DIAGNOSIS — N62 Hypertrophy of breast: Secondary | ICD-10-CM

## 2021-05-17 NOTE — Progress Notes (Signed)
Referring Provider Glendale Chard, MD [address] [place],   [id number]   CC:  Chief Complaint  Patient presents with   Advice Only      Desiree Dunn is an 50 y.o. female.  HPI: Patient presents to discuss breast and abdominal surgery.  She has had a breast reduction in the past by Dr. Harlow Mares back in 2010.  She has been very happy with the result.  She currently does have back pain and potentially may want to be a little bit smaller.  She does not smoke and is prediabetic.  Regarding her abdomen she is bothered by excess skin and feels that the heaviness of her abdomen is contributing to some back pain.  Abdominal surgeries include a laparoscopic hysterectomy that was about 10 years ago.  She is planning to start a weight loss medication in the next few weeks that should help with her prediabetes and borderline cholesterol.  Allergies  Allergen Reactions   Amoxicillin Hives    Hives    Outpatient Encounter Medications as of 05/17/2021  Medication Sig   acetaminophen (TYLENOL) 500 MG tablet Take 1,000 mg by mouth as needed for mild pain or headache.   albuterol (VENTOLIN HFA) 108 (90 Base) MCG/ACT inhaler TAKE 2 PUFFS BY MOUTH EVERY 4 HOURS AS NEEDED   Azelastine-Fluticasone 137-50 MCG/ACT SUSP Place 1 spray into the nose 2 (two) times daily.   EPINEPHrine (EPIPEN 2-PAK) 0.3 mg/0.3 mL IJ SOAJ injection Inject 0.3 mg into the muscle as needed for anaphylaxis.   hydrochlorothiazide (MICROZIDE) 12.5 MG capsule Take 1 capsule (12.5 mg total) by mouth daily.   ibuprofen (ADVIL,MOTRIN) 200 MG tablet Take 400 mg by mouth as needed for moderate pain.   montelukast (SINGULAIR) 10 MG tablet TAKE 1 TABLET BY MOUTH EVERYDAY AT BEDTIME   olmesartan (BENICAR) 40 MG tablet TAKE 1 TABLET BY MOUTH EVERY DAY   Olopatadine HCl (PAZEO) 0.7 % SOLN Place 1 drop into both eyes 1 day or 1 dose. (Patient taking differently: Place 1 drop into both eyes 1 day or 1 dose. As needed)    Probiotic Product (PROBIOTIC DAILY PO) Take by mouth.   TURMERIC PO Take by mouth. AS NEEDED   verapamil (CALAN-SR) 120 MG CR tablet TAKE 1 TABLET (120 MG TOTAL) BY MOUTH DAILY.   No facility-administered encounter medications on file as of 05/17/2021.     Past Medical History:  Diagnosis Date   Allergy    Anxiety    Back pain    Chronic RLQ pain    since 1995 when she had ectopic preg   Constipation    Deviated septum    Diverticulitis    Ectopic pregnancy 1995    x 1 , R side    Eczema    GERD (gastroesophageal reflux disease)    HTN (hypertension) 05/24/2015   Migraine without aura and without status migrainosus, not intractable    Miscarriage    x 1   Obesity, unspecified 07/06/2010   Pelvic congestion    h/o    Sleep apnea    uses cpap    Past Surgical History:  Procedure Laterality Date   ABDOMINAL HYSTERECTOMY  12/2009   [place] REDUCTION SURGERY  10/2008   COLONOSCOPY  2001   X3 ; diverticulosis   FOOT SURGERY     NASAL SEPTOPLASTY W/ TURBINOPLASTY Bilateral 10/31/2018   Procedure: NASAL SEPTOPLASTY WITH TURBINATE REDUCTION;  Surgeon: Jerrell Belfast, MD;  Location: [facility];  Service: ENT;  Laterality: Bilateral;   PARTIAL HYSTERECTOMY     POLYPECTOMY     HPP    REDUCTION MAMMAPLASTY Bilateral 10/2008   RIGHT OOPHORECTOMY  12/2009   [facility]   TRIGGER FINGER RELEASE Right 04/28/2021   middle finger   TUBAL LIGATION  2000    Family History  Problem Relation Age of Onset   Colon cancer Mother 57   Colon cancer Maternal Aunt 40   Stroke Father    Diabetes Father    Hypertension Father    Hyperlipidemia Father    Heart disease Father    Obesity Father    Hypertension Sister    Colon polyps Brother    Colitis Brother    Depression Son    Asperger's syndrome Son    Breast cancer Paternal Grandmother    Prostate cancer Brother    Heart attack Neg Hx    Esophageal cancer Neg Hx    Rectal cancer Neg Hx    Stomach cancer Neg Hx      Social History   Social History Narrative   Household-- pt , husband and children   daughter 72   son 2000     Review of Systems General: Denies fevers, chills, weight loss CV: Denies chest pain, shortness of breath, palpitations  Physical Exam Vitals with BMI 05/17/2021 05/08/2021 02/21/2021  Height 5\' 4"  5' 6.2" 5' 4.8"  Weight 241 lbs 13 oz 243 lbs 3 oz 240 lbs 6 oz  BMI 41.48 46.27 03.50  Systolic 093 818 299  Diastolic 70 76 80  Pulse 89 86 84    General:  No acute distress,  Alert and oriented, Non-Toxic, Normal speech and affect Breast: She has grade 2 ptosis.  Sternal notch to nipple is 32 cm bilaterally.  Nipple to fold is 13 cm on the left and 12 cm on the right.  She overall has a very nice symmetric result from her prior breast reduction with Wise pattern scars. Abdomen: Abdomen is soft nontender.  I do not appreciate any hernias.  She does have moderate excess skin in the supra and infraumbilical area with some contribution of intra-abdominal fullness to the upper abdomen.  Assessment/Plan With the patient planning to start weight loss medication within the next few weeks I recommended against any plans for surgery at this point.  We did talk through her options for revision breast reduction and potentially panniculectomy.  I explained the details of those procedures and general expectations.  We will plan to set up a follow-up appointment for her 4 to 6 months from now to review her weight status to see if her candidacy for surgery would be more appropriate at that time.  All her questions were answered.  Cindra Presume 05/17/2021, 5:14 PM

## 2021-05-30 ENCOUNTER — Encounter: Payer: Self-pay | Admitting: Internal Medicine

## 2021-06-07 DIAGNOSIS — M25551 Pain in right hip: Secondary | ICD-10-CM | POA: Diagnosis not present

## 2021-06-07 DIAGNOSIS — M5459 Other low back pain: Secondary | ICD-10-CM | POA: Diagnosis not present

## 2021-06-14 ENCOUNTER — Other Ambulatory Visit: Payer: Self-pay | Admitting: Allergy

## 2021-06-15 DIAGNOSIS — M25551 Pain in right hip: Secondary | ICD-10-CM | POA: Diagnosis not present

## 2021-06-15 DIAGNOSIS — M5459 Other low back pain: Secondary | ICD-10-CM | POA: Diagnosis not present

## 2021-06-26 ENCOUNTER — Encounter: Payer: Self-pay | Admitting: Internal Medicine

## 2021-06-27 ENCOUNTER — Telehealth: Payer: Self-pay

## 2021-06-27 NOTE — Telephone Encounter (Signed)
I left the pt a message that I was calling to schedule her a virtual appointment.

## 2021-06-28 ENCOUNTER — Encounter: Payer: Self-pay | Admitting: Internal Medicine

## 2021-06-28 ENCOUNTER — Other Ambulatory Visit: Payer: Self-pay

## 2021-06-28 ENCOUNTER — Ambulatory Visit: Payer: BC Managed Care – PPO | Admitting: Internal Medicine

## 2021-06-28 VITALS — BP 130/72 | HR 82 | Temp 99.2°F | Ht 64.0 in | Wt 236.0 lb

## 2021-06-28 DIAGNOSIS — H9201 Otalgia, right ear: Secondary | ICD-10-CM | POA: Diagnosis not present

## 2021-06-28 DIAGNOSIS — E8881 Metabolic syndrome: Secondary | ICD-10-CM | POA: Diagnosis not present

## 2021-06-28 DIAGNOSIS — I10 Essential (primary) hypertension: Secondary | ICD-10-CM | POA: Diagnosis not present

## 2021-06-28 DIAGNOSIS — Z6841 Body Mass Index (BMI) 40.0 and over, adult: Secondary | ICD-10-CM

## 2021-06-28 DIAGNOSIS — R509 Fever, unspecified: Secondary | ICD-10-CM | POA: Diagnosis not present

## 2021-06-28 NOTE — Patient Instructions (Signed)
Obesity, Adult Obesity is having too much body fat. Being obese means that your weight is morethan what is healthy for you. BMI is a number that explains how much body fat you have. If you have a BMI of 30 or more, you are obese. Obesity is often caused by eating or drinking morecalories than your body uses. Changing your lifestyle can help you lose weight. Obesity can cause serious health problems, such as: Stroke. Coronary artery disease (CAD). Type 2 diabetes. Some types of cancer, including cancers of the colon, breast, uterus, and gallbladder. Osteoarthritis. High blood pressure (hypertension). High cholesterol. Sleep apnea. Gallbladder stones. Infertility problems. What are the causes? Eating meals each day that are high in calories, sugar, and fat. Being born with genes that may make you more likely to become obese. Having a medical condition that causes obesity. Taking certain medicines. Sitting a lot (having a sedentary lifestyle). Not getting enough sleep. Drinking a lot of drinks that have sugar in them. What increases the risk? Having a family history of obesity. Being an [place] American woman. Being a Hispanic man. Living in an area with limited access to: [place], recreation centers, or sidewalks. Healthy food choices, such as grocery stores and farmers' markets. What are the signs or symptoms? The main sign is having too much body fat. How is this treated? Treatment for this condition often includes changing your lifestyle. Treatment may include: Changing your diet. This may include making a healthy meal plan. Exercise. This may include activity that causes your heart to beat faster (aerobic exercise) and strength training. Work with your doctor to design a program that works for you. Medicine to help you lose weight. This may be used if you are not able to lose 1 pound a week after 6 weeks of healthy eating and more exercise. Treating conditions that cause the  obesity. Surgery. Options may include gastric banding and gastric bypass. This may be done if: Other treatments have not helped to improve your condition. You have a BMI of 40 or higher. You have life-threatening health problems related to obesity. Follow these instructions at home: Eating and drinking  Follow advice from your doctor about what to eat and drink. Your doctor may tell you to: Limit fast food, sweets, and processed snack foods. Choose low-fat options. For example, choose low-fat milk instead of whole milk. Eat 5 or more servings of fruits or vegetables each day. Eat at home more often. This gives you more control over what you eat. Choose healthy foods when you eat out. Learn to read food labels. This will help you learn how much food is in 1 serving. Keep low-fat snacks available. Avoid drinks that have a lot of sugar in them. These include soda, fruit juice, iced tea with sugar, and flavored milk. Drink enough water to keep your pee (urine) pale yellow. Do not go on fad diets.  Physical activity Exercise often, as told by your doctor. Most adults should get up to 150 minutes of moderate-intensity exercise every week.Ask your doctor: What types of exercise are safe for you. How often you should exercise. Warm up and stretch before being active. Do slow stretching after being active (cool down). Rest between times of being active. Lifestyle Work with your doctor and a food expert (dietitian) to set a weight-loss goal that is best for you. Limit your screen time. Find ways to reward yourself that do not involve food. Do not drink alcohol if: Your doctor tells you not to drink.  You are pregnant, may be pregnant, or are planning to become pregnant. If you drink alcohol: Limit how much you use to: 0-1 drink a day for women. 0-2 drinks a day for men. Be aware of how much alcohol is in your drink. In the [place] one drink equals one 12 oz bottle of beer (355 mL), one 5 oz  glass of wine (148 mL), or one 1 oz glass of hard liquor (44 mL). General instructions Keep a weight-loss journal. This can help you keep track of: The food that you eat. How much exercise you get. Take over-the-counter and prescription medicines only as told by your doctor. Take vitamins and supplements only as told by your doctor. Think about joining a support group. Keep all follow-up visits as told by your doctor. This is important. Contact a doctor if: You cannot meet your weight loss goal after you have changed your diet and lifestyle for 6 weeks. Get help right away if you: Are having trouble breathing. Are having thoughts of harming yourself. Summary Obesity is having too much body fat. Being obese means that your weight is more than what is healthy for you. Work with your doctor to set a weight-loss goal. Get regular exercise as told by your doctor. This information is not intended to replace advice given to you by your health care provider. Make sure you discuss any questions you have with your healthcare provider. Document Revised: 07/03/2018 Document Reviewed: 07/03/2018 Elsevier Patient Education  2022 [employer].

## 2021-06-28 NOTE — Progress Notes (Signed)
I,Tianna Badgett,acting as a Education administrator for [name], MD.,have documented all relevant documentation on the behalf of [name], MD,as directed by  [name], MD while in the presence of [name], MD.  This visit occurred during the SARS-CoV-2 public health emergency.  Safety protocols were in place, including screening questions prior to the visit, additional usage of staff PPE, and extensive cleaning of exam room while observing appropriate contact time as indicated for disinfecting solutions.  Subjective:     Patient ID: Desiree Dunn , female    DOB: 1971/09/11 , 50 y.o.   MRN: [id number]   Chief Complaint  Patient presents with   Weight Check    HPI  Patient is here for weight check. She is taking semaglutide 0.'25mg'$  weekly. She has not had any issues with the medication.    Past Medical History:  Diagnosis Date   Allergy    Anxiety    Back pain    Chronic RLQ pain    since 1995 when she had ectopic preg   Constipation    Deviated septum    Diverticulitis    Ectopic pregnancy 1995    x 1 , R side    Eczema    GERD (gastroesophageal reflux disease)    HTN (hypertension) 05/24/2015   Migraine without aura and without status migrainosus, not intractable    Miscarriage    x 1   Obesity, unspecified 07/06/2010   Pelvic congestion    h/o    Sleep apnea    uses cpap     Family History  Problem Relation Age of Onset   Colon cancer Mother 46   Colon cancer Maternal Aunt 29   Stroke Father    Diabetes Father    Hypertension Father    Hyperlipidemia Father    Heart disease Father    Obesity Father    Hypertension Sister    Colon polyps Brother    Colitis Brother    Depression Son    Asperger's syndrome Son    Breast cancer Paternal Grandmother    Prostate cancer Brother    Heart attack Neg Hx    Esophageal cancer Neg Hx    Rectal cancer Neg Hx    Stomach cancer Neg Hx      Current Outpatient Medications:    acetaminophen  (TYLENOL) 500 MG tablet, Take 1,000 mg by mouth as needed for mild pain or headache., Disp: , Rfl:    albuterol (VENTOLIN HFA) 108 (90 Base) MCG/ACT inhaler, TAKE 2 PUFFS BY MOUTH EVERY 4 HOURS AS NEEDED, Disp: 18 g, Rfl: 1   Azelastine-Fluticasone 137-50 MCG/ACT SUSP, Place 1 spray into the nose 2 (two) times daily., Disp: 23 g, Rfl: 5   EPINEPHrine (EPIPEN 2-PAK) 0.3 mg/0.3 mL IJ SOAJ injection, Inject 0.3 mg into the muscle as needed for anaphylaxis., Disp: 2 each, Rfl: 2   hydrochlorothiazide (MICROZIDE) 12.5 MG capsule, Take 1 capsule (12.5 mg total) by mouth daily., Disp: 90 capsule, Rfl: 3   ibuprofen (ADVIL,MOTRIN) 200 MG tablet, Take 400 mg by mouth as needed for moderate pain., Disp: , Rfl:    montelukast (SINGULAIR) 10 MG tablet, TAKE 1 TABLET BY MOUTH EVERYDAY AT BEDTIME, Disp: 90 tablet, Rfl: 0   olmesartan (BENICAR) 40 MG tablet, TAKE 1 TABLET BY MOUTH EVERY DAY, Disp: 90 tablet, Rfl: 1   Olopatadine HCl (PAZEO) 0.7 % SOLN, Place 1 drop into both eyes 1 day or 1 dose. (Patient taking differently: Place 1  drop into both eyes 1 day or 1 dose. As needed), Disp: 2.5 mL, Rfl: 5   Probiotic Product (PROBIOTIC DAILY PO), Take by mouth., Disp: , Rfl:    TURMERIC PO, Take by mouth. AS NEEDED, Disp: , Rfl:    verapamil (CALAN-SR) 120 MG CR tablet, TAKE 1 TABLET (120 MG TOTAL) BY MOUTH DAILY., Disp: 90 tablet, Rfl: 1   Allergies  Allergen Reactions   Amoxicillin Hives    Hives     Review of Systems  Constitutional: Negative.   HENT:  Positive for ear pain.        She c/o r ear pain. Has had chills. Took COVID test, this was neg a week ago. Also reports having swollen lymph nodes. Improved with OTC meds.   Respiratory: Negative.    Cardiovascular: Negative.   Gastrointestinal: Negative.   Neurological: Negative.     Today's Vitals   06/28/21 1538  BP: 130/72  Pulse: 82  Temp: 99.2 F (37.3 C)  TempSrc: Oral  Weight: 236 lb (107 kg)  Height: '5\' 4"'$  (1.626 m)   Body mass index is  40.51 kg/m.  Wt Readings from Last 3 Encounters:  06/28/21 236 lb (107 kg)  05/17/21 241 lb 12.8 oz (109.7 kg)  05/08/21 243 lb 3.2 oz (110.3 kg)    Objective:  Physical Exam Vitals and nursing note reviewed.  Constitutional:      Appearance: Normal appearance.  HENT:     Head: Normocephalic and atraumatic.     Right Ear: Tympanic membrane, ear canal and external ear normal. There is no impacted cerumen.     Left Ear: Tympanic membrane, ear canal and external ear normal. There is no impacted cerumen.     Nose:     Comments: Masked     Mouth/Throat:     Comments: Masked  Eyes:     Extraocular Movements: Extraocular movements intact.  Cardiovascular:     Rate and Rhythm: Normal rate and regular rhythm.     Heart sounds: Normal heart sounds.  Pulmonary:     Effort: Pulmonary effort is normal.     Breath sounds: Normal breath sounds.  Musculoskeletal:     Cervical back: Normal range of motion.  Skin:    General: Skin is warm.  Neurological:     General: No focal deficit present.     Mental Status: She is alert.  Psychiatric:        Mood and Affect: Mood normal.        Behavior: Behavior normal.        Assessment And Plan:     1. Class 3 severe obesity due to excess calories with serious comorbidity and body mass index (BMI) of 40.0 to 44.9 in adult [facility]) Comments: BMI 40. She was congratulated on 5 lb weight loss in the past six weeks.  I will increase semaglutide to 0.'5mg'$  once weekly. She will f/u in eight weeks. She is encouraged to aim for at least 150 minutes of exercise per week.  2. Insulin resistance Comments: Pt reminded that semaglutide will address this as well. Encouraged to limit her intake of sweetened beverages.   3. Right ear pain Comments: I do not think an acute ear infection is present. Rapid COVID test neg. She was given sample of Norel AD to take twice daily prn.  She will let me know if her sx persist. - POC COVID-19 - Novel Coronavirus, NAA  ([facility])  4. Essential hypertension Comments: Chronic, controlled. NO med  changes today.    Patient was given opportunity to ask questions. Patient verbalized understanding of the plan and was able to repeat key elements of the plan. All questions were answered to their satisfaction.   I, [name], MD, have reviewed all documentation for this visit. The documentation on 06/30/21 for the exam, diagnosis, procedures, and orders are all accurate and complete.   IF YOU HAVE BEEN REFERRED TO A SPECIALIST, IT MAY TAKE 1-2 WEEKS TO SCHEDULE/PROCESS THE REFERRAL. IF YOU HAVE NOT HEARD FROM US/SPECIALIST IN TWO WEEKS, PLEASE GIVE [place] A CALL AT [phone].   THE PATIENT IS ENCOURAGED TO PRACTICE SOCIAL DISTANCING DUE TO THE COVID-19 PANDEMIC.

## 2021-06-29 LAB — SARS-COV-2, NAA 2 DAY TAT

## 2021-06-29 LAB — NOVEL CORONAVIRUS, NAA: SARS-CoV-2, NAA: NOT DETECTED

## 2021-06-30 LAB — POC COVID19 BINAXNOW: SARS Coronavirus 2 Ag: NEGATIVE

## 2021-07-12 DIAGNOSIS — M25551 Pain in right hip: Secondary | ICD-10-CM | POA: Diagnosis not present

## 2021-07-12 DIAGNOSIS — M5459 Other low back pain: Secondary | ICD-10-CM | POA: Diagnosis not present

## 2021-07-14 ENCOUNTER — Ambulatory Visit (INDEPENDENT_AMBULATORY_CARE_PROVIDER_SITE_OTHER): Payer: BC Managed Care – PPO | Admitting: Podiatry

## 2021-07-14 ENCOUNTER — Ambulatory Visit (INDEPENDENT_AMBULATORY_CARE_PROVIDER_SITE_OTHER): Payer: BC Managed Care – PPO | Admitting: Allergy

## 2021-07-14 ENCOUNTER — Ambulatory Visit (INDEPENDENT_AMBULATORY_CARE_PROVIDER_SITE_OTHER): Payer: BC Managed Care – PPO

## 2021-07-14 ENCOUNTER — Other Ambulatory Visit: Payer: Self-pay

## 2021-07-14 ENCOUNTER — Encounter: Payer: Self-pay | Admitting: Podiatry

## 2021-07-14 ENCOUNTER — Encounter: Payer: Self-pay | Admitting: Allergy

## 2021-07-14 VITALS — BP 120/70 | HR 86 | Temp 97.2°F | Resp 16 | Ht 64.0 in | Wt 234.2 lb

## 2021-07-14 DIAGNOSIS — M7661 Achilles tendinitis, right leg: Secondary | ICD-10-CM

## 2021-07-14 DIAGNOSIS — J3089 Other allergic rhinitis: Secondary | ICD-10-CM | POA: Diagnosis not present

## 2021-07-14 DIAGNOSIS — H6121 Impacted cerumen, right ear: Secondary | ICD-10-CM | POA: Diagnosis not present

## 2021-07-14 DIAGNOSIS — J45990 Exercise induced bronchospasm: Secondary | ICD-10-CM

## 2021-07-14 DIAGNOSIS — H1013 Acute atopic conjunctivitis, bilateral: Secondary | ICD-10-CM

## 2021-07-14 MED ORDER — TRIAMCINOLONE ACETONIDE 10 MG/ML IJ SUSP
10.0000 mg | Freq: Once | INTRAMUSCULAR | Status: AC
  Administered 2021-07-14: 10 mg

## 2021-07-14 MED ORDER — OXYMETAZOLINE HCL 0.05 % NA SOLN: 2.0000 | Freq: Two times a day (BID) | NASAL | 0 refills | Status: AC

## 2021-07-14 NOTE — Progress Notes (Signed)
Follow-up Note  RE: Desiree Dunn MRN: [id number] DOB: February 28, 1971 Date of Office Visit: 07/14/2021   History of present illness: Desiree Dunn is a 50 y.o. female presenting today for follow-up of allergic rhinitis with conjunctivitis and exercise-induced asthma.  She was last seen in the office on 08/18/2020 by myself.  She states her glands in her neck have been feeling "wonky" for past several weeks.  She also reports her right ear has been hurting.  She saw her PCP about 2 weeks ago for these issues.  She states she had a low grade fever at that the visit and she had covid testing performed that were negative.  She states her PCP gave her Norel AD and she took it for 2 days.  She also states she needed to have her ear flushed out but due to the low-grade fever it was not performed.  She is also reports her eyes are watering.  She states her right ear still feels heavy.   She takes xyzal in morning and singulair at night.  She uses dymista in the mornings before work but not every day.  Will also use nasal saline spray as well.  She does feel like the xyzal helps but she does not feel like the singulair is helping.  She has used pazeo.  Se denies any need for her albuterol for symptoms but does use pirior to activity.  She has not had any need for urgent care or ED visits or any systemic steroid needs regards to exercise-induced asthma.  For her job she does home visits and has a significant amount of outdoor activity.  School was also started back up and that she does not know if her symptoms are driven by her allergies or viral.  She was on allergen immunotherapy but her last injection was in February 2022.  At that time she did have illness and was unable to come get her injections.  Unfortunately she called the office to see when she would be able to resume but this message did not get to me that she was never called back and that she never came to resume her shots.  She  is however interested in getting back on her immunotherapy as she does feel like it was helping her.  Review of systems: Review of Systems  Constitutional:        See HPI  HENT:         See HPI  Eyes:        See HPI  Respiratory: Negative.    Cardiovascular: Negative.   Gastrointestinal: Negative.   Musculoskeletal: Negative.   Skin: Negative.   Neurological: Negative.    All other systems negative unless noted above in HPI  Past medical/social/surgical/family history have been reviewed and are unchanged unless specifically indicated below.  No changes  Medication List: Current Outpatient Medications  Medication Sig Dispense Refill   acetaminophen (TYLENOL) 500 MG tablet Take 1,000 mg by mouth as needed for mild pain or headache.     albuterol (VENTOLIN HFA) 108 (90 Base) MCG/ACT inhaler TAKE 2 PUFFS BY MOUTH EVERY 4 HOURS AS NEEDED 18 g 1   Azelastine-Fluticasone 137-50 MCG/ACT SUSP Place 1 spray into the nose 2 (two) times daily. 23 g 5   EPINEPHrine (EPIPEN 2-PAK) 0.3 mg/0.3 mL IJ SOAJ injection Inject 0.3 mg into the muscle as needed for anaphylaxis. 2 each 2   hydrochlorothiazide (MICROZIDE) 12.5 MG capsule Take 1 capsule (12.5 mg total) by mouth daily.  90 capsule 3   ibuprofen (ADVIL,MOTRIN) 200 MG tablet Take 400 mg by mouth as needed for moderate pain.     montelukast (SINGULAIR) 10 MG tablet TAKE 1 TABLET BY MOUTH EVERYDAY AT BEDTIME 90 tablet 0   olmesartan (BENICAR) 40 MG tablet TAKE 1 TABLET BY MOUTH EVERY DAY 90 tablet 1   oxymetazoline (AFRIN NASAL SPRAY) 0.05 % nasal spray Place 2 sprays into both nostrils 2 (two) times daily. Use no more than 3-5 days at a time. 30 mL 0   Semaglutide,0.25 or 0.'5MG'$ /DOS, (OZEMPIC, 0.25 OR 0.5 MG/DOSE,) 2 MG/1.5ML SOPN Inject 0.5 mg as directed once a week.     TURMERIC PO Take by mouth. AS NEEDED     verapamil (CALAN-SR) 120 MG CR tablet TAKE 1 TABLET (120 MG TOTAL) BY MOUTH DAILY. 90 tablet 1   No current facility-administered  medications for this visit.     Known medication allergies: Allergies  Allergen Reactions   Amoxicillin Hives    Hives     Physical examination: Blood pressure 120/70, pulse 86, temperature (!) 97.2 F (36.2 C), temperature source Temporal, resp. rate 16, height '5\' 4"'$  (1.626 m), weight 234 lb 3.2 oz (106.2 kg), SpO2 99 %.  General: Alert, interactive, in no acute distress. HEENT: PERRLA, right TM not able to be visualized due to cerumen impaction, left TMs pearly gray, turbinates markedly edematous and pale with clear discharge, post-pharynx non erythematous. Neck: Supple without lymphadenopathy. Lungs: Clear to auscultation without wheezing, rhonchi or rales. {no increased work of breathing. CV: Normal S1, S2 without murmurs. Abdomen: Nondistended, nontender. Skin: Warm and dry, without lesions or rashes. Extremities:  No clubbing, cyanosis or edema. Neuro:   Grossly intact.  Diagnositics/Labs: None today  Assessment and plan: Allergic rhinitis with conjunctivitis  -Continue avoidance measures for tree pollens, weed, pollens, grass pollens, molds, dust mite, dog and cockroach  -Continue Xyzal '5mg'$  daily as needed (if needed may take twice a day)  -Continue Singulair '10mg'$  daily at bedtime.    - for pretty severe nasal congestion recommend use of Afrin 2 sprays each nostril twice a day no more than the next 3-5 days to help decongest your nose.  Wait 5 to 15 minutes or until you can breathe more freely through your nose then follow-up with your Dymista  -Continue Dymista 1 spray each nostril twice a day as needed for nasal congestion/drainage  -Continue use of nasal saline rinse to help flush and clean the nose.  Use prior to medicated nasal spray use.  Use only distilled water or boil water and bring to room/lukewarm temperature prior to use.   - for itchy/watery/red eyes use Pazeo 1 drop each eye daily as needed.    - use your dry eye drop or rewetting drop to help keep eyes  moisturized  -Resume allergen immunotherapy per protocol. Have access to an epinephrine device on days of your injections.  We will need to back down and we build back up due to 57-monthgap.  Exercise induced asthma  - have access to albuterol inhaler 2 puffs every 4-6 hours as needed for cough/wheeze/shortness of breath/chest tightness.  May use 15-20 minutes prior to activity.   Monitor frequency of use.    Cerumen impaction -Recommend you see your PCP for flush of the right ear  Follow-up 6-12 months or sooner if needed  I appreciate the opportunity to take part in Desiree Dunn's care. Please do not hesitate to contact me with questions.  Sincerely,  Prudy Feeler, MD Allergy/Immunology [facility]

## 2021-07-14 NOTE — Patient Instructions (Addendum)
Allergies  -Continue avoidance measures for tree pollens, weed, pollens, grass pollens, molds, dust mite, dog and cockroach  -Continue Xyzal '5mg'$  daily as needed (if needed may take twice a day)  -Continue Singulair '10mg'$  daily at bedtime.    - for pretty severe nasal congestion recommend use of Afrin 2 sprays each nostril twice a day no more than the next 3-5 days to help decongest your nose.  Wait 5 to 15 minutes or until you can breathe more freely through your nose then follow-up with your Dymista  -Continue Dymista 1 spray each nostril twice a day as needed for nasal congestion/drainage  -Continue use of nasal saline rinse to help flush and clean the nose.  Use prior to medicated nasal spray use.  Use only distilled water or boil water and bring to room/lukewarm temperature prior to use.   - for itchy/watery/red eyes use Pazeo 1 drop each eye daily as needed.    - use your dry eye drop or rewetting drop to help keep eyes moisturized  -Resume allergen immunotherapy per protocol. Have access to an epinephrine device on days of your injections.  We will need to back down and we build back up due to 59-monthgap.  Exercise induced asthma  - have access to albuterol inhaler 2 puffs every 4-6 hours as needed for cough/wheeze/shortness of breath/chest tightness.  May use 15-20 minutes prior to activity.   Monitor frequency of use.    Cerumen impaction -Recommend you see your PCP for flush of the right ear  Follow-up 6-12 months or sooner if needed

## 2021-07-14 NOTE — Patient Instructions (Signed)

## 2021-07-17 NOTE — Progress Notes (Signed)
Subjective:   Patient ID: Desiree Dunn, female   DOB: 50 y.o.   MRN: [id number]   HPI Patient presents stating the back of the right heel has been very tender and has just started since she started back in school again and was doing well   ROS      Objective:  Physical Exam  Neurovascular status intact with inflammation pain posterior lateral aspect right heel with the central and medial tendon not involved currently in     Assessment:  Inflammatory tendinitis right posterior heel lateral side with pain     Plan:  H&P reviewed condition x-ray sterile prep and before doing injection did discuss chances for rupture associated with injection in the Achilles and patient wants this done and adjusted the lateral side of the right Achilles staying away central medial 3 mg Dexasone Kenalog 5 mg Xylocaine advised on boot usage that she has at home along with ice therapy and reduced activity.  Reappoint as symptoms indicate  X-rays indicate small spur formation no indication stress fracture arthritis

## 2021-07-18 ENCOUNTER — Ambulatory Visit (INDEPENDENT_AMBULATORY_CARE_PROVIDER_SITE_OTHER): Payer: BC Managed Care – PPO | Admitting: Internal Medicine

## 2021-07-18 ENCOUNTER — Other Ambulatory Visit: Payer: Self-pay

## 2021-07-18 ENCOUNTER — Encounter: Payer: Self-pay | Admitting: Internal Medicine

## 2021-07-18 VITALS — BP 124/86 | HR 90 | Temp 98.2°F | Ht 64.0 in | Wt 234.0 lb

## 2021-07-18 DIAGNOSIS — H6123 Impacted cerumen, bilateral: Secondary | ICD-10-CM | POA: Diagnosis not present

## 2021-07-18 DIAGNOSIS — H9201 Otalgia, right ear: Secondary | ICD-10-CM

## 2021-07-18 DIAGNOSIS — Z23 Encounter for immunization: Secondary | ICD-10-CM | POA: Diagnosis not present

## 2021-07-18 NOTE — Patient Instructions (Signed)
Earache, Adult An earache, or ear pain, can be caused by many things, including: An infection. Ear wax buildup. Ear pressure. Something in the ear that should not be there (foreign body). A sore throat. Tooth problems. Jaw problems. Treatment of the earache will depend on the cause. If the cause is not clear or cannot be determined, you may need to watch your symptoms until your earache goes away or until a cause is found. Follow these instructions at home: Medicines Take or apply over-the-counter and prescription medicines only as told by your health care provider. If you were prescribed an antibiotic medicine, use it as told by your health care provider. Do not stop using the antibiotic even if you start to feel better. Do not put anything in your ear other than medicine that is prescribed by your health care provider. Managing pain If directed, apply heat to the affected area as often as told by your health care provider. Use the heat source that your health care provider recommends, such as a moist heat pack or a heating pad. Place a towel between your skin and the heat source. Leave the heat on for 20-30 minutes. Remove the heat if your skin turns bright red. This is especially important if you are unable to feel pain, heat, or cold. You may have a greater risk of getting burned. If directed, put ice on the affected area as often as told by your health care provider. To do this:   Put ice in a plastic bag. Place a towel between your skin and the bag. Leave the ice on for 20 minutes, 2-3 times a day. General instructions Pay attention to any changes in your symptoms. Try resting in an upright position instead of lying down. This may help to reduce pressure in your ear and relieve pain. Chew gum if it helps to relieve your ear pain. Treat any allergies as told by your health care provider. Drink enough fluid to keep your urine pale yellow. It is up to you to get the results of any  tests that were done. Ask your health care provider, or the department that is doing the tests, when your results will be ready. Keep all follow-up visits as told by your health care provider. This is important. Contact a health care provider if: Your pain does not improve within 2 days. Your earache gets worse. You have new symptoms. You have a fever. Get help right away if you: Have a severe headache. Have a stiff neck. Have trouble swallowing. Have redness or swelling behind your ear. Have fluid or blood coming from your ear. Have hearing loss. Feel dizzy. Summary An earache, or ear pain, can be caused by many things. Treatment of the earache will depend on the cause. Follow recommendations from your health care provider to treat your ear pain. If the cause is not clear or cannot be determined, you may need to watch your symptoms until your earache goes away or until a cause is found. Keep all follow-up visits as told by your health care provider. This is important. This information is not intended to replace advice given to you by your health care provider. Make sure you discuss any questions you have with your health care provider. Document Revised: 06/06/2019 Document Reviewed: 06/06/2019 Elsevier Patient Education  2022 [employer]  

## 2021-07-18 NOTE — Progress Notes (Signed)
I,Katawbba Wiggins,acting as a Education administrator for [name], MD.,have documented all relevant documentation on the behalf of [name], MD,as directed by  [name], MD while in the presence of [name], MD.  This visit occurred during the SARS-CoV-2 public health emergency.  Safety protocols were in place, including screening questions prior to the visit, additional usage of staff PPE, and extensive cleaning of exam room while observing appropriate contact time as indicated for disinfecting solutions.  Subjective:     Patient ID: Desiree Dunn , female    DOB: [birth year] , 50 y.o.   MRN: [id number]   Chief Complaint  Patient presents with   Otalgia    right    HPI  The patient is here today for evaluation of ear pain.  She states her pain has persisted despite taking Norel AD. She also feels as if she is underwater. No fever/chills.   Otalgia  There is pain in the right ear. This is a recurrent problem. The current episode started 1 to 4 weeks ago. The problem has been unchanged. There has been no fever. The pain is mild. Pertinent negatives include no coughing, diarrhea, hearing loss or neck pain.    Past Medical History:  Diagnosis Date   Allergy    Anxiety    Back pain    Chronic RLQ pain    since 1995 when she had ectopic preg   Constipation    Deviated septum    Diverticulitis    Ectopic pregnancy 1995    x 1 , R side    Eczema    GERD (gastroesophageal reflux disease)    HTN (hypertension) 05/24/2015   Migraine without aura and without status migrainosus, not intractable    Miscarriage    x 1   Obesity, unspecified 07/06/2010   Pelvic congestion    h/o    Sleep apnea    uses cpap     Family History  Problem Relation Age of Onset   Colon cancer Mother 64   Colon cancer Maternal Aunt 16   Stroke Father    Diabetes Father    Hypertension Father    Hyperlipidemia Father    Heart disease Father    Obesity Father    Hypertension Sister     Colon polyps Brother    Colitis Brother    Depression Son    Asperger's syndrome Son    Breast cancer Paternal Grandmother    Prostate cancer Brother    Heart attack Neg Hx    Esophageal cancer Neg Hx    Rectal cancer Neg Hx    Stomach cancer Neg Hx      Current Outpatient Medications:    acetaminophen (TYLENOL) 500 MG tablet, Take 1,000 mg by mouth as needed for mild pain or headache., Disp: , Rfl:    albuterol (VENTOLIN HFA) 108 (90 Base) MCG/ACT inhaler, TAKE 2 PUFFS BY MOUTH EVERY 4 HOURS AS NEEDED, Disp: 18 g, Rfl: 1   Azelastine-Fluticasone 137-50 MCG/ACT SUSP, Place 1 spray into the nose 2 (two) times daily., Disp: 23 g, Rfl: 5   EPINEPHrine (EPIPEN 2-PAK) 0.3 mg/0.3 mL IJ SOAJ injection, Inject 0.3 mg into the muscle as needed for anaphylaxis., Disp: 2 each, Rfl: 2   hydrochlorothiazide (MICROZIDE) 12.5 MG capsule, Take 1 capsule (12.5 mg total) by mouth daily., Disp: 90 capsule, Rfl: 3   ibuprofen (ADVIL,MOTRIN) 200 MG tablet, Take 400 mg by mouth as needed for moderate pain., Disp: , Rfl:  montelukast (SINGULAIR) 10 MG tablet, TAKE 1 TABLET BY MOUTH EVERYDAY AT BEDTIME, Disp: 90 tablet, Rfl: 0   olmesartan (BENICAR) 40 MG tablet, TAKE 1 TABLET BY MOUTH EVERY DAY, Disp: 90 tablet, Rfl: 1   oxymetazoline (AFRIN NASAL SPRAY) 0.05 % nasal spray, Place 2 sprays into both nostrils 2 (two) times daily. Use no more than 3-5 days at a time., Disp: 30 mL, Rfl: 0   Semaglutide,0.25 or 0.'5MG'$ /DOS, (OZEMPIC, 0.25 OR 0.5 MG/DOSE,) 2 MG/1.5ML SOPN, Inject 0.5 mg as directed once a week., Disp: , Rfl:    TURMERIC PO, Take by mouth. AS NEEDED, Disp: , Rfl:    verapamil (CALAN-SR) 120 MG CR tablet, TAKE 1 TABLET (120 MG TOTAL) BY MOUTH DAILY., Disp: 90 tablet, Rfl: 1   Allergies  Allergen Reactions   Amoxicillin Hives    Hives     Review of Systems  Constitutional: Negative.   HENT:  Positive for ear pain. Negative for hearing loss.   Respiratory: Negative.  Negative for cough.    Cardiovascular: Negative.   Gastrointestinal: Negative.  Negative for diarrhea.  Musculoskeletal:  Negative for neck pain.  Neurological: Negative.   Psychiatric/Behavioral: Negative.      Today's Vitals   07/18/21 1043  BP: 124/86  Pulse: 90  Temp: 98.2 F (36.8 C)  TempSrc: Other (Comment)  Weight: 234 lb (106.1 kg)  Height: '5\' 4"'$  (1.626 m)  PainSc: 5   PainLoc: Ear   Body mass index is 40.17 kg/m.  Wt Readings from Last 3 Encounters:  07/18/21 234 lb (106.1 kg)  07/14/21 234 lb 3.2 oz (106.2 kg)  06/28/21 236 lb (107 kg)    BP Readings from Last 3 Encounters:  07/18/21 124/86  07/14/21 120/70  06/28/21 130/72    Objective:  Physical Exam Vitals and nursing note reviewed.  Constitutional:      Appearance: Normal appearance. She is obese.  HENT:     Head: Normocephalic and atraumatic.     Right Ear: Ear canal and external ear normal. There is impacted cerumen.     Left Ear: Ear canal and external ear normal. There is impacted cerumen.  Eyes:     Extraocular Movements: Extraocular movements intact.  Cardiovascular:     Rate and Rhythm: Normal rate and regular rhythm.     Heart sounds: Normal heart sounds.  Pulmonary:     Effort: Pulmonary effort is normal.     Breath sounds: Normal breath sounds.  Musculoskeletal:     Cervical back: Normal range of motion.  Skin:    General: Skin is warm.  Neurological:     General: No focal deficit present.     Mental Status: She is alert.  Psychiatric:        Mood and Affect: Mood normal.        Behavior: Behavior normal.        Assessment And Plan:     1. Right ear pain Comments: No acute abnormalities noted on exam after irrigation.   2. Bilateral impacted cerumen  AFTER OBTAINING VERBAL CONSENT, BOTH EARS WERE FLUSHED BY IRRIGATION. SHE TOLERATED PROCEDURE WELL WITHOUT ANY COMPLICATIONS. NO TM ABNORMALITIES WERE NOTED. - Ear Lavage  3. Immunization due Comments: She was given flu vaccine.  - Flu Vaccine  QUAD 6+ mos PF IM (Fluarix Quad PF)    Patient was given opportunity to ask questions. Patient verbalized understanding of the plan and was able to repeat key elements of the plan. All questions were answered to  their satisfaction.   I, [name], MD, have reviewed all documentation for this visit. The documentation on 07/18/21 for the exam, diagnosis, procedures, and orders are all accurate and complete.   IF YOU HAVE BEEN REFERRED TO A SPECIALIST, IT MAY TAKE 1-2 WEEKS TO SCHEDULE/PROCESS THE REFERRAL. IF YOU HAVE NOT HEARD FROM US/SPECIALIST IN TWO WEEKS, PLEASE GIVE [place] A CALL AT [phone].   THE PATIENT IS ENCOURAGED TO PRACTICE SOCIAL DISTANCING DUE TO THE COVID-19 PANDEMIC.

## 2021-07-30 ENCOUNTER — Encounter: Payer: Self-pay | Admitting: Internal Medicine

## 2021-07-30 ENCOUNTER — Other Ambulatory Visit: Payer: Self-pay | Admitting: Internal Medicine

## 2021-07-30 DIAGNOSIS — H9201 Otalgia, right ear: Secondary | ICD-10-CM

## 2021-07-31 ENCOUNTER — Other Ambulatory Visit: Payer: Self-pay | Admitting: Internal Medicine

## 2021-07-31 DIAGNOSIS — M5459 Other low back pain: Secondary | ICD-10-CM | POA: Diagnosis not present

## 2021-07-31 DIAGNOSIS — M25551 Pain in right hip: Secondary | ICD-10-CM | POA: Diagnosis not present

## 2021-07-31 MED ORDER — DOXYCYCLINE HYCLATE 100 MG PO TABS: 100.0000 mg | ORAL_TABLET | Freq: Two times a day (BID) | ORAL | 0 refills | Status: DC

## 2021-08-01 ENCOUNTER — Other Ambulatory Visit: Payer: Self-pay | Admitting: Internal Medicine

## 2021-08-01 MED ORDER — FLUCONAZOLE 150 MG PO TABS: 150.0000 mg | ORAL_TABLET | Freq: Every day | ORAL | 0 refills | Status: DC

## 2021-08-06 ENCOUNTER — Other Ambulatory Visit: Payer: Self-pay

## 2021-08-06 ENCOUNTER — Encounter (HOSPITAL_BASED_OUTPATIENT_CLINIC_OR_DEPARTMENT_OTHER): Payer: Self-pay

## 2021-08-06 ENCOUNTER — Emergency Department (HOSPITAL_BASED_OUTPATIENT_CLINIC_OR_DEPARTMENT_OTHER)
Admission: EM | Admit: 2021-08-06 | Discharge: 2021-08-06 | Disposition: A | Discharge status: Home / Self Care | Payer: BC Managed Care – PPO | Attending: Emergency Medicine | Admitting: Emergency Medicine

## 2021-08-06 DIAGNOSIS — H66001 Acute suppurative otitis media without spontaneous rupture of ear drum, right ear: Secondary | ICD-10-CM | POA: Insufficient documentation

## 2021-08-06 DIAGNOSIS — I10 Essential (primary) hypertension: Secondary | ICD-10-CM | POA: Insufficient documentation

## 2021-08-06 DIAGNOSIS — Z79899 Other long term (current) drug therapy: Secondary | ICD-10-CM | POA: Diagnosis not present

## 2021-08-06 DIAGNOSIS — H9201 Otalgia, right ear: Secondary | ICD-10-CM | POA: Diagnosis not present

## 2021-08-06 MED ORDER — CEFDINIR 300 MG PO CAPS: 300.0000 mg | ORAL_CAPSULE | Freq: Two times a day (BID) | ORAL | 0 refills | Status: AC

## 2021-08-06 MED ORDER — OXYCODONE HCL 5 MG PO TABS
5.0000 mg | ORAL_TABLET | ORAL | 0 refills | Status: DC | PRN
Start: 2021-08-06 — End: 2021-09-10

## 2021-08-06 NOTE — Discharge Instructions (Addendum)
You may take Tylenol (acetaminophen) 1000 mg 4 times a day for 1 week. This is the maximum dose of Tylenol usually take from all sources. Please check other over-the-counter medications and prescriptions to ensure you are not taking other medications that contain acetaminophen.  You may also take ibuprofen 400 mg 6 times a day alternating with or at the same time as tylenol.  Take oxycodone as needed for breakthrough pain.  This medication can be addicting, sedating and cause constipation.

## 2021-08-06 NOTE — ED Provider Notes (Signed)
[facility] EMERGENCY DEPARTMENT Provider Note   CSN: [id number] Arrival date & time: 08/06/21  4132     History Chief Complaint  Patient presents with   Otalgia    Desiree Dunn is a 50 y.o. female.  HPI     50yo female with history below presents with concern for right ear pain.   Has been present for one month, saw PCP 2 weeks ago, had wax removed, told she had some fluid, maybe allergic.  Wednesday, she sent message to dr and was started on doxycycline. Has been taking it since then. No fevers. Has had some headaches. No visual changes, numbness, weakness.  Pain located right ear pain, radiating towards jaw. No nausea or vomiting.    Past Medical History:  Diagnosis Date   Allergy    Anxiety    Back pain    Chronic RLQ pain    since 1995 when she had ectopic preg   Constipation    Deviated septum    Diverticulitis    Ectopic pregnancy 1995    x 1 , R side    Eczema    GERD (gastroesophageal reflux disease)    HTN (hypertension) 05/24/2015   Migraine without aura and without status migrainosus, not intractable    Miscarriage    x 1   Obesity, unspecified 07/06/2010   Pelvic congestion    h/o    Sleep apnea    uses cpap    Patient Active Problem List   Diagnosis Date Noted   Postnasal drip 05/20/2019   Sorethroat 05/20/2019   Rash 11/21/2018   Non-seasonal allergic rhinitis 11/21/2018   Hypertrophy, nasal, turbinate 10/08/2018   Moderate obstructive sleep apnea-hypopnea syndrome 10/08/2018   Deviated septum 10/08/2018   OSA on CPAP 10/08/2018   Insulin resistance 09/15/2018   Obstructive sleep apnea syndrome 09/01/2018   PCP NOTES >>> 08/25/2015   HTN (hypertension) 05/24/2015   Episodic tension-type headache, not intractable 02/11/2015   Migraine without aura and without status migrainosus, not intractable 06/02/2014   GERD (gastroesophageal reflux disease) 12/01/2012   CTS (carpal tunnel syndrome) 12/01/2012   Dermatitis  12/01/2012   Annual physical exam 03/19/2012   HEADACHE 12/25/2010   Class 3 severe obesity due to excess calories with serious comorbidity and body mass index (BMI) of 40.0 to 44.9 in adult ([place]) 07/06/2010   Abdominal pain, chronic, right lower quadrant 06/09/2009   Nausea alone 04/27/2009    Past Surgical History:  Procedure Laterality Date   ABDOMINAL HYSTERECTOMY  12/2009   [place] REDUCTION SURGERY  10/2008   COLONOSCOPY  2001   X3 ; diverticulosis   FOOT SURGERY     NASAL SEPTOPLASTY W/ TURBINOPLASTY Bilateral 10/31/2018   Procedure: NASAL SEPTOPLASTY WITH TURBINATE REDUCTION;  Surgeon: Jerrell Belfast, MD;  Location: [facility];  Service: ENT;  Laterality: Bilateral;   PARTIAL HYSTERECTOMY     POLYPECTOMY     HPP    REDUCTION MAMMAPLASTY Bilateral 10/2008   RIGHT OOPHORECTOMY  12/2009   [facility]   TRIGGER FINGER RELEASE Right 04/28/2021   middle finger   TUBAL LIGATION  2000     OB History     Gravida  3   Para  2   Term      Preterm      AB      Living         SAB      IAB      Ectopic  Multiple      Live Births              Family History  Problem Relation Age of Onset   Colon cancer Mother 57   Colon cancer Maternal Aunt 34   Stroke Father    Diabetes Father    Hypertension Father    Hyperlipidemia Father    Heart disease Father    Obesity Father    Hypertension Sister    Colon polyps Brother    Colitis Brother    Depression Son    Asperger's syndrome Son    Breast cancer Paternal Grandmother    Prostate cancer Brother    Heart attack Neg Hx    Esophageal cancer Neg Hx    Rectal cancer Neg Hx    Stomach cancer Neg Hx     Social History   Tobacco Use   Smoking status: Never   Smokeless tobacco: Never  Vaping Use   Vaping Use: Never used  Substance Use Topics   Alcohol use: Yes    Alcohol/week: 0.0 standard drinks    Comment: socially - 1 drink every 3 months   Drug use: No    Home Medications Prior  to Admission medications   Medication Sig Start Date End Date Taking? Authorizing Provider  cefdinir (OMNICEF) 300 MG capsule Take 1 capsule (300 mg total) by mouth 2 (two) times daily for 10 days. 08/06/21 08/16/21 Yes Gareth [place], MD  doxycycline (VIBRA-TABS) 100 MG tablet Take 1 tablet (100 mg total) by mouth 2 (two) times daily. 07/31/21   Glendale Chard, MD  oxyCODONE (ROXICODONE) 5 MG immediate release tablet Take 1 tablet (5 mg total) by mouth every 4 (four) hours as needed for severe pain. 08/06/21  Yes Gareth [place], MD  acetaminophen (TYLENOL) 500 MG tablet Take 1,000 mg by mouth as needed for mild pain or headache.    [provider]  albuterol (VENTOLIN HFA) 108 (90 Base) MCG/ACT inhaler TAKE 2 PUFFS BY MOUTH EVERY 4 HOURS AS NEEDED 08/18/20   Kennith Gain, MD  Azelastine-Fluticasone 137-50 MCG/ACT SUSP Place 1 spray into the nose 2 (two) times daily. 08/18/20   Kennith Gain, MD  EPINEPHrine (EPIPEN 2-PAK) 0.3 mg/0.3 mL IJ SOAJ injection Inject 0.3 mg into the muscle as needed for anaphylaxis. 08/18/20   Kennith Gain, MD  fluconazole (DIFLUCAN) 150 MG tablet Take 1 tablet (150 mg total) by mouth daily. 08/01/21   Glendale Chard, MD  hydrochlorothiazide (MICROZIDE) 12.5 MG capsule Take 1 capsule (12.5 mg total) by mouth daily. 10/11/20 10/11/21  Glendale Chard, MD  ibuprofen (ADVIL,MOTRIN) 200 MG tablet Take 400 mg by mouth as needed for moderate pain.    [provider]  montelukast (SINGULAIR) 10 MG tablet TAKE 1 TABLET BY MOUTH EVERYDAY AT BEDTIME 06/14/21   Kennith Gain, MD  olmesartan (BENICAR) 40 MG tablet TAKE 1 TABLET BY MOUTH EVERY DAY 04/28/21   Glendale Chard, MD  oxymetazoline [facility] NASAL SPRAY) 0.05 % nasal spray Place 2 sprays into both nostrils 2 (two) times daily. Use no more than 3-5 days at a time. 07/14/21   Kennith Gain, MD  Semaglutide,0.25 or 0.5MG /DOS, (OZEMPIC, 0.25 OR 0.5 MG/DOSE,) 2  MG/1.5ML SOPN Inject 0.5 mg as directed once a week.    [provider]  TURMERIC PO Take by mouth. AS NEEDED    [provider]  verapamil (CALAN-SR) 120 MG CR tablet TAKE 1 TABLET (120 MG TOTAL) BY MOUTH DAILY. 02/06/21  Glendale Chard, MD    Allergies    Amoxicillin  Review of Systems   Review of Systems  Constitutional:  Negative for fever.  HENT:  Positive for ear pain. Negative for sore throat and trouble swallowing.   Eyes:  Negative for visual disturbance.  Cardiovascular:  Negative for chest pain.  Neurological:  Positive for headaches. Negative for syncope, facial asymmetry, weakness and numbness.   Physical Exam Updated Vital Signs BP (!) 134/91   Pulse 78   Temp 98.3 F (36.8 C) (Oral)   Resp 16   Ht 5\' 4"  (1.626 m)   Wt 107 kg   SpO2 98%   BMI 40.51 kg/m   Physical Exam Vitals and nursing note reviewed.  Constitutional:      General: She is not in acute distress.    Appearance: Normal appearance. She is not ill-appearing, toxic-appearing or diaphoretic.  HENT:     Head: Normocephalic.     Right Ear: A middle ear effusion is present. Tympanic membrane is bulging.     Left Ear:  No middle ear effusion.  Eyes:     Conjunctiva/sclera: Conjunctivae normal.  Cardiovascular:     Rate and Rhythm: Normal rate and regular rhythm.     Pulses: Normal pulses.  Pulmonary:     Effort: Pulmonary effort is normal. No respiratory distress.  Musculoskeletal:        General: No deformity or signs of injury.     Cervical back: No rigidity.  Skin:    General: Skin is warm and dry.     Coloration: Skin is not jaundiced or pale.  Neurological:     General: No focal deficit present.     Mental Status: She is alert and oriented to person, place, and time.    ED Results / Procedures / Treatments   Labs (all labs ordered are listed, but only abnormal results are displayed) Labs Reviewed - No data to display  EKG None  Radiology No results  found.  Procedures Procedures   Medications Ordered in ED Medications - No data to display  ED Course  I have reviewed the triage vital signs and the nursing notes.  Pertinent labs & imaging results that were available during my care of the patient were reviewed by me and considered in my medical decision making (see chart for details).    MDM Rules/Calculators/A&P                            50yo female with history below presents with concern for right ear pain.  Low suspicion for mastoiditis, subdural empyema, parotitis, jugular venous thrombosis.  Agree exam at this time seems consistent with otitis media, appears purulent.  She is pen allergic but tolerates cephalosporins--will add cefdinir, recommend ENT follow up, return if new or worsening symptoms.   Final Clinical Impression(s) / ED Diagnoses Final diagnoses:  Acute suppurative otitis media of right ear without spontaneous rupture of tympanic membrane, recurrence not specified    Rx / DC Orders ED Discharge Orders          Ordered    cefdinir (OMNICEF) 300 MG capsule  2 times daily        08/06/21 0922    oxyCODONE (ROXICODONE) 5 MG immediate release tablet  Every 4 hours PRN        08/06/21 5726             Gareth Morgan, MD 08/06/21 2245

## 2021-08-06 NOTE — ED Triage Notes (Signed)
Pt arrives ambulatory to ED with c/o pain to right ear X1 month states that she was seen by her PCP and had some wax removed, was told she had "some fluid in the ear" and was started on doxycycline. Pt reports continued pain to right ear with pain now radiating into right face. Pt has been using tylenol at home with no change in pain.

## 2021-08-21 ENCOUNTER — Other Ambulatory Visit: Payer: Self-pay

## 2021-08-21 ENCOUNTER — Ambulatory Visit (INDEPENDENT_AMBULATORY_CARE_PROVIDER_SITE_OTHER): Payer: BC Managed Care – PPO | Admitting: Otolaryngology

## 2021-08-21 DIAGNOSIS — H9201 Otalgia, right ear: Secondary | ICD-10-CM | POA: Diagnosis not present

## 2021-08-21 NOTE — Progress Notes (Signed)
HPI: Desiree Dunn is a 50 y.o. female who presents is referred by her PCP for evaluation of right ear discomfort.  This initially began about a month ago where she had some sinus issues as well as right ear pain and fullness in the right ear.  She has history of allergies for which she takes Zyrtec Singulair and Dymista.  She was treated with antibiotics and decongestants.  It is feeling better presently but she still has slight discomfort when she lies on the right side..  Past Medical History:  Diagnosis Date   Allergy    Anxiety    Back pain    Chronic RLQ pain    since 1995 when she had ectopic preg   Constipation    Deviated septum    Diverticulitis    Ectopic pregnancy 1995    x 1 , R side    Eczema    GERD (gastroesophageal reflux disease)    HTN (hypertension) 05/24/2015   Migraine without aura and without status migrainosus, not intractable    Miscarriage    x 1   Obesity, unspecified 07/06/2010   Pelvic congestion    h/o    Sleep apnea    uses cpap   Past Surgical History:  Procedure Laterality Date   ABDOMINAL HYSTERECTOMY  12/2009   [facility]   BREAST REDUCTION SURGERY  10/2008   COLONOSCOPY  2001   X3 ; diverticulosis   FOOT SURGERY     NASAL SEPTOPLASTY W/ TURBINOPLASTY Bilateral 10/31/2018   Procedure: NASAL SEPTOPLASTY WITH TURBINATE REDUCTION;  Surgeon: Jerrell Belfast, MD;  Location: [facility];  Service: ENT;  Laterality: Bilateral;   PARTIAL HYSTERECTOMY     POLYPECTOMY     HPP    REDUCTION MAMMAPLASTY Bilateral 10/2008   RIGHT OOPHORECTOMY  12/2009   [place]   TRIGGER FINGER RELEASE Right 04/28/2021   middle finger   TUBAL LIGATION  2000   Social History   Socioeconomic History   Marital status: Married    Spouse name: Charles   Number of children: 2   Years of education: Not on file   Highest education level: Not on file  Occupational History   Occupation: Counsellor, Par time   Occupation: works full time at [employer]:  [place] Use   Smoking status: Never   Smokeless tobacco: Never  Vaping Use   Vaping Use: Never used  Substance and Sexual Activity   Alcohol use: Yes    Alcohol/week: 0.0 standard drinks    Comment: socially - 1 drink every 3 months   Drug use: No   Sexual activity: Yes    Partners: Male    Birth control/protection: Surgical  Other Topics Concern   Not on file  Social History Narrative   Household-- pt , husband and children   daughter 9   son 42   Social Determinants of Radio broadcast assistant Strain: Not on file  Food Insecurity: Not on file  Transportation Needs: Not on file  Physical Activity: Not on file  Stress: Not on file  Social Connections: Not on file   Family History  Problem Relation Age of Onset   Colon cancer Mother 40   Colon cancer Maternal Aunt 37   Stroke Father    Diabetes Father    Hypertension Father    Hyperlipidemia Father    Heart disease Father    Obesity Father    Hypertension Sister    Colon  polyps Brother    Colitis Brother    Depression Son    Asperger's syndrome Son    Breast cancer Paternal Grandmother    Prostate cancer Brother    Heart attack Neg Hx    Esophageal cancer Neg Hx    Rectal cancer Neg Hx    Stomach cancer Neg Hx    Allergies  Allergen Reactions   Amoxicillin Hives    Hives   Prior to Admission medications   Medication Sig Start Date End Date Taking? Authorizing Provider  doxycycline (VIBRA-TABS) 100 MG tablet Take 1 tablet (100 mg total) by mouth 2 (two) times daily. 07/31/21   Glendale Chard, MD  acetaminophen (TYLENOL) 500 MG tablet Take 1,000 mg by mouth as needed for mild pain or headache.    [provider]  albuterol (VENTOLIN HFA) 108 (90 Base) MCG/ACT inhaler TAKE 2 PUFFS BY MOUTH EVERY 4 HOURS AS NEEDED 08/18/20   Kennith Gain, MD  Azelastine-Fluticasone 137-50 MCG/ACT SUSP Place 1 spray into the nose 2 (two) times daily. 08/18/20   Kennith Gain, MD  EPINEPHrine (EPIPEN 2-PAK) 0.3 mg/0.3 mL IJ SOAJ injection Inject 0.3 mg into the muscle as needed for anaphylaxis. 08/18/20   Kennith Gain, MD  fluconazole (DIFLUCAN) 150 MG tablet Take 1 tablet (150 mg total) by mouth daily. 08/01/21   Glendale Chard, MD  hydrochlorothiazide (MICROZIDE) 12.5 MG capsule Take 1 capsule (12.5 mg total) by mouth daily. 10/11/20 10/11/21  Glendale Chard, MD  ibuprofen (ADVIL,MOTRIN) 200 MG tablet Take 400 mg by mouth as needed for moderate pain.    [provider]  montelukast (SINGULAIR) 10 MG tablet TAKE 1 TABLET BY MOUTH EVERYDAY AT BEDTIME 06/14/21   Kennith Gain, MD  olmesartan (BENICAR) 40 MG tablet TAKE 1 TABLET BY MOUTH EVERY DAY 04/28/21   Glendale Chard, MD  oxyCODONE (ROXICODONE) 5 MG immediate release tablet Take 1 tablet (5 mg total) by mouth every 4 (four) hours as needed for severe pain. 08/06/21   Gareth Morgan, MD  oxymetazoline (AFRIN NASAL SPRAY) 0.05 % nasal spray Place 2 sprays into both nostrils 2 (two) times daily. Use no more than 3-5 days at a time. 07/14/21   Kennith Gain, MD  Semaglutide,0.25 or 0.5MG /DOS, (OZEMPIC, 0.25 OR 0.5 MG/DOSE,) 2 MG/1.5ML SOPN Inject 0.5 mg as directed once a week.    [provider]  TURMERIC PO Take by mouth. AS NEEDED    [provider]  verapamil (CALAN-SR) 120 MG CR tablet TAKE 1 TABLET (120 MG TOTAL) BY MOUTH DAILY. 02/06/21   Glendale Chard, MD     Positive ROS: Otherwise negative  All other systems have been reviewed and were otherwise negative with the exception of those mentioned in the HPI and as above.  Physical Exam: Constitutional: Alert, well-appearing, no acute distress Ears: External ears without lesions or tenderness. Ear canals are clear bilaterally with minimal wax buildup that was cleaned with a curette.  There is no inflammatory changes of the ear canals on either side.  Both TMs are clear with good mobility  on pneumatic otoscopy.  No middle ear serous otitis or infection noted.  Hearing screening with a tuning forks revealed symmetric hearing in both ears with AC > BC bilaterally. Nasal: External nose without lesions. Septum with minimal deformity and moderate rhinitis.  Both middle meatus regions were clear with no purulent discharge noted.. Clear nasal passages otherwise. Oral: Lips and gums without lesions. Tongue and palate mucosa without  lesions. Posterior oropharynx clear.  Tonsil regions appear benign bilaterally.  Denies sore throat. Neck: No palpable adenopathy or masses.  No TMJ pain or discomfort on opening and closing her jaw. Respiratory: Breathing comfortably  Skin: No facial/neck lesions or rash noted.  Procedures  Assessment: Right ear discomfort with normal examination with no evidence of ear infection or middle ear abnormality.  No ear canal abnormality noted.  Plan: Reassured patient of normal ear examination.  No signs of infection today on clinical exam. Would recommend taking a NSAID if she is having any discomfort. She will follow-up as needed.   Radene Journey, MD   CC:

## 2021-08-28 ENCOUNTER — Encounter: Payer: Self-pay | Admitting: Internal Medicine

## 2021-08-28 ENCOUNTER — Other Ambulatory Visit: Payer: Self-pay

## 2021-08-28 ENCOUNTER — Ambulatory Visit (INDEPENDENT_AMBULATORY_CARE_PROVIDER_SITE_OTHER): Payer: BC Managed Care – PPO | Admitting: Internal Medicine

## 2021-08-28 VITALS — BP 124/80 | HR 81 | Temp 98.1°F | Ht 64.0 in | Wt 234.0 lb

## 2021-08-28 DIAGNOSIS — E8881 Metabolic syndrome: Secondary | ICD-10-CM | POA: Diagnosis not present

## 2021-08-28 DIAGNOSIS — I10 Essential (primary) hypertension: Secondary | ICD-10-CM

## 2021-08-28 DIAGNOSIS — Z6841 Body Mass Index (BMI) 40.0 and over, adult: Secondary | ICD-10-CM | POA: Diagnosis not present

## 2021-08-28 NOTE — Progress Notes (Signed)
I,Katawbba Wiggins,acting as a Education administrator for [name], MD.,have documented all relevant documentation on the behalf of [name], MD,as directed by  [name], MD while in the presence of [name], MD.  This visit occurred during the SARS-CoV-2 public health emergency.  Safety protocols were in place, including screening questions prior to the visit, additional usage of staff PPE, and extensive cleaning of exam room while observing appropriate contact time as indicated for disinfecting solutions.  Subjective:     Patient ID: Desiree Dunn , female    DOB: [birth year] , 50 y.o.   MRN: [id number]   Chief Complaint  Patient presents with   Obesity    HPI  The patient is here today for a follow-up on her weight. She has been taking semaglutide without any issues. She has noticed that it has slightly decreased her appetite. Now using 0.$RemoveBef'25mg'[id number]$  weekly.   Hypertension This is a chronic problem. The current episode started more than 1 year ago. The problem has been gradually improving since onset. The problem is controlled. Pertinent negatives include no blurred vision, chest pain, palpitations or shortness of breath. Risk factors for coronary artery disease include obesity and sedentary lifestyle. Past treatments include angiotensin blockers and calcium channel blockers. The current treatment provides moderate improvement.    Past Medical History:  Diagnosis Date   Allergy    Anxiety    Back pain    Chronic RLQ pain    since 1995 when she had ectopic preg   Constipation    Deviated septum    Diverticulitis    Ectopic pregnancy 1995    x 1 , R side    Eczema    GERD (gastroesophageal reflux disease)    HTN (hypertension) 05/24/2015   Migraine without aura and without status migrainosus, not intractable    Miscarriage    x 1   Obesity, unspecified 07/06/2010   Pelvic congestion    h/o    Sleep apnea    uses cpap     Family History  Problem Relation Age  of Onset   Colon cancer Mother 79   Colon cancer Maternal Aunt 37   Stroke Father    Diabetes Father    Hypertension Father    Hyperlipidemia Father    Heart disease Father    Obesity Father    Hypertension Sister    Colon polyps Brother    Colitis Brother    Depression Son    Asperger's syndrome Son    Breast cancer Paternal Grandmother    Prostate cancer Brother    Heart attack Neg Hx    Esophageal cancer Neg Hx    Rectal cancer Neg Hx    Stomach cancer Neg Hx      Current Outpatient Medications:    acetaminophen (TYLENOL) 500 MG tablet, Take 1,000 mg by mouth as needed for mild pain or headache., Disp: , Rfl:    doxycycline (VIBRA-TABS) 100 MG tablet, Take 1 tablet (100 mg total) by mouth 2 (two) times daily., Disp: 20 tablet, Rfl: 0   EPINEPHrine (EPIPEN 2-PAK) 0.3 mg/0.3 mL IJ SOAJ injection, Inject 0.3 mg into the muscle as needed for anaphylaxis., Disp: 2 each, Rfl: 2   hydrochlorothiazide (MICROZIDE) 12.5 MG capsule, Take 1 capsule (12.5 mg total) by mouth daily., Disp: 90 capsule, Rfl: 3   ibuprofen (ADVIL,MOTRIN) 200 MG tablet, Take 400 mg by mouth as needed for moderate pain., Disp: , Rfl:    olmesartan (BENICAR) 40 MG  tablet, TAKE 1 TABLET BY MOUTH EVERY DAY, Disp: 90 tablet, Rfl: 1   oxyCODONE (ROXICODONE) 5 MG immediate release tablet, Take 1 tablet (5 mg total) by mouth every 4 (four) hours as needed for severe pain., Disp: 5 tablet, Rfl: 0   oxymetazoline (AFRIN NASAL SPRAY) 0.05 % nasal spray, Place 2 sprays into both nostrils 2 (two) times daily. Use no more than 3-5 days at a time., Disp: 30 mL, Rfl: 0   Semaglutide,0.25 or 0.5MG/DOS, (OZEMPIC, 0.25 OR 0.5 MG/DOSE,) 2 MG/1.5ML SOPN, Inject 0.5 mg as directed once a week., Disp: , Rfl:    TURMERIC PO, Take by mouth. AS NEEDED, Disp: , Rfl:    albuterol (VENTOLIN HFA) 108 (90 Base) MCG/ACT inhaler, INHALE 2 PUFFS BY MOUTH EVERY 4 HOURS AS NEEDED, Disp: 18 each, Rfl: 1   Azelastine-Fluticasone 137-50 MCG/ACT SUSP,  PLACE 1 SPRAY INTO THE NOSE 2 (TWO) TIMES DAILY., Disp: 23 g, Rfl: 5   montelukast (SINGULAIR) 10 MG tablet, TAKE 1 TABLET BY MOUTH EVERYDAY AT BEDTIME, Disp: 90 tablet, Rfl: 1   verapamil (CALAN-SR) 120 MG CR tablet, TAKE 1 TABLET BY MOUTH EVERY DAY, Disp: 90 tablet, Rfl: 1   Allergies  Allergen Reactions   Amoxicillin Hives    Hives     Review of Systems  Constitutional: Negative.   Eyes:  Negative for blurred vision.  Respiratory: Negative.  Negative for shortness of breath.   Cardiovascular: Negative.  Negative for chest pain and palpitations.  Gastrointestinal: Negative.   Neurological: Negative.   Psychiatric/Behavioral: Negative.    All other systems reviewed and are negative.   Today's Vitals   08/28/21 1538  BP: 124/80  Pulse: 81  Temp: 98.1 F (36.7 C)  Weight: 234 lb (106.1 kg)  Height: _0  (1.626 m)   Body mass index is 40.17 kg/m.  Wt Readings from Last 3 Encounters:  08/28/21 234 lb (106.1 kg)  08/06/21 236 lb (107 kg)  07/18/21 234 lb (106.1 kg)    BP Readings from Last 3 Encounters:  08/28/21 124/80  08/06/21 (!) 134/91  07/18/21 124/86    Objective:  Physical Exam Vitals and nursing note reviewed.  Constitutional:      Appearance: Normal appearance.  HENT:     Head: Normocephalic and atraumatic.     Nose:     Comments: Masked     Mouth/Throat:     Comments: Masked  Eyes:     Extraocular Movements: Extraocular movements intact.  Cardiovascular:     Rate and Rhythm: Normal rate and regular rhythm.     Heart sounds: Normal heart sounds.  Pulmonary:     Effort: Pulmonary effort is normal.     Breath sounds: Normal breath sounds.  Musculoskeletal:     Cervical back: Normal range of motion.  Skin:    General: Skin is warm.  Neurological:     General: No focal deficit present.     Mental Status: She is alert.  Psychiatric:        Mood and Affect: Mood normal.        Behavior: Behavior normal.        Assessment And Plan:     1.  Class 3 severe obesity due to excess calories with serious comorbidity and body mass index (BMI) of 40.0 to 44.9 in adult [facility]) Comments: She has lost 2 lbs since Sep 2022. I will send rx 0.7m semaglutide weekly to her pharmacy. Encouraged to incorporate more exercise into her daily routine.  She will f/u in 8 weeks for re-evaluation.   2. Insulin resistance Comments: I will check labs as listed below. She is encouraged to limit her intake of refined carbs.  - BMP8+EGFR - Hemoglobin A1c  3. Essential hypertension Comments: Chronic, well controlled. She is encouraged to follow low sodium diet. No med changes.    Patient was given opportunity to ask questions. Patient verbalized understanding of the plan and was able to repeat key elements of the plan. All questions were answered to their satisfaction.   I, [name], MD, have reviewed all documentation for this visit. The documentation on 08/28/21 for the exam, diagnosis, procedures, and orders are all accurate and complete.   IF YOU HAVE BEEN REFERRED TO A SPECIALIST, IT MAY TAKE 1-2 WEEKS TO SCHEDULE/PROCESS THE REFERRAL. IF YOU HAVE NOT HEARD FROM US/SPECIALIST IN TWO WEEKS, PLEASE GIVE [place] A CALL AT [phone].   THE PATIENT IS ENCOURAGED TO PRACTICE SOCIAL DISTANCING DUE TO THE COVID-19 PANDEMIC.

## 2021-08-28 NOTE — Patient Instructions (Signed)

## 2021-08-29 LAB — HEMOGLOBIN A1C
Est. average glucose Bld gHb Est-mCnc: 117 mg/dL
Hgb A1c MFr Bld: 5.7 % — ABNORMAL HIGH (ref 4.8–5.6)

## 2021-08-29 LAB — BMP8+EGFR
BUN/Creatinine Ratio: 10 (ref 9–23)
BUN: 10 mg/dL (ref 6–24)
CO2: 20 mmol/L (ref 20–29)
Calcium: 9.2 mg/dL (ref 8.7–10.2)
Chloride: 103 mmol/L (ref 96–106)
Creatinine, Ser: 0.97 mg/dL (ref 0.57–1.00)
Glucose: 82 mg/dL (ref 70–99)
Potassium: 3.6 mmol/L (ref 3.5–5.2)
Sodium: 141 mmol/L (ref 134–144)
eGFR: 71 mL/min/{1.73_m2} (ref >59)

## 2021-09-06 ENCOUNTER — Encounter: Payer: Self-pay | Admitting: Internal Medicine

## 2021-09-07 ENCOUNTER — Other Ambulatory Visit: Payer: Self-pay | Admitting: Allergy

## 2021-09-07 ENCOUNTER — Other Ambulatory Visit: Payer: Self-pay | Admitting: Internal Medicine

## 2021-09-10 DIAGNOSIS — G4733 Obstructive sleep apnea (adult) (pediatric): Secondary | ICD-10-CM | POA: Diagnosis not present

## 2021-09-11 DIAGNOSIS — M5459 Other low back pain: Secondary | ICD-10-CM | POA: Diagnosis not present

## 2021-09-11 DIAGNOSIS — M25551 Pain in right hip: Secondary | ICD-10-CM | POA: Diagnosis not present

## 2021-09-19 IMAGING — MG MM DIGITAL SCREENING BILAT W/ TOMO AND CAD
6 of 12 series · 6 of 36 positions shown · non-contrast
Comparison: Previous exam(s).

CLINICAL DATA: Screening.

EXAM:
DIGITAL SCREENING BILATERAL MAMMOGRAM WITH TOMOSYNTHESIS AND CAD
TECHNIQUE: Bilateral screening digital craniocaudal and mediolateral oblique
mammograms were obtained. Bilateral screening digital breast
tomosynthesis was performed. The images were evaluated with
computer-aided detection.

[R CC synth-2D (1 of 2)]
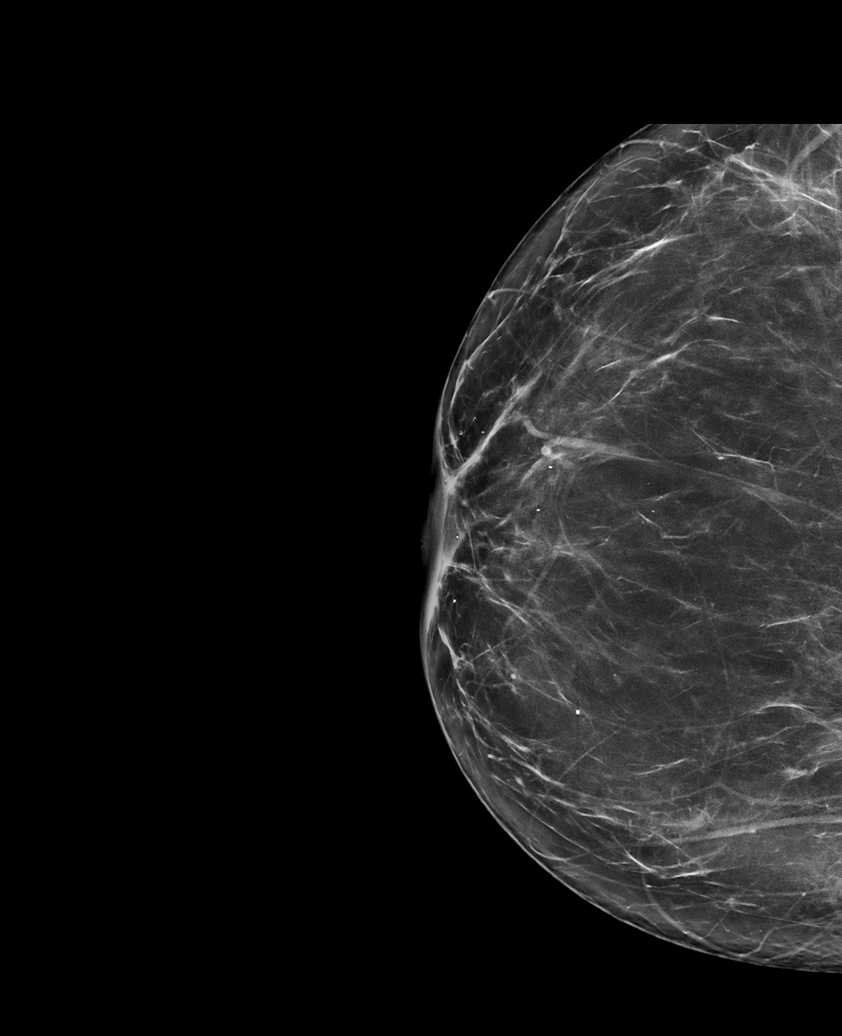

[L MLO synth-2D]
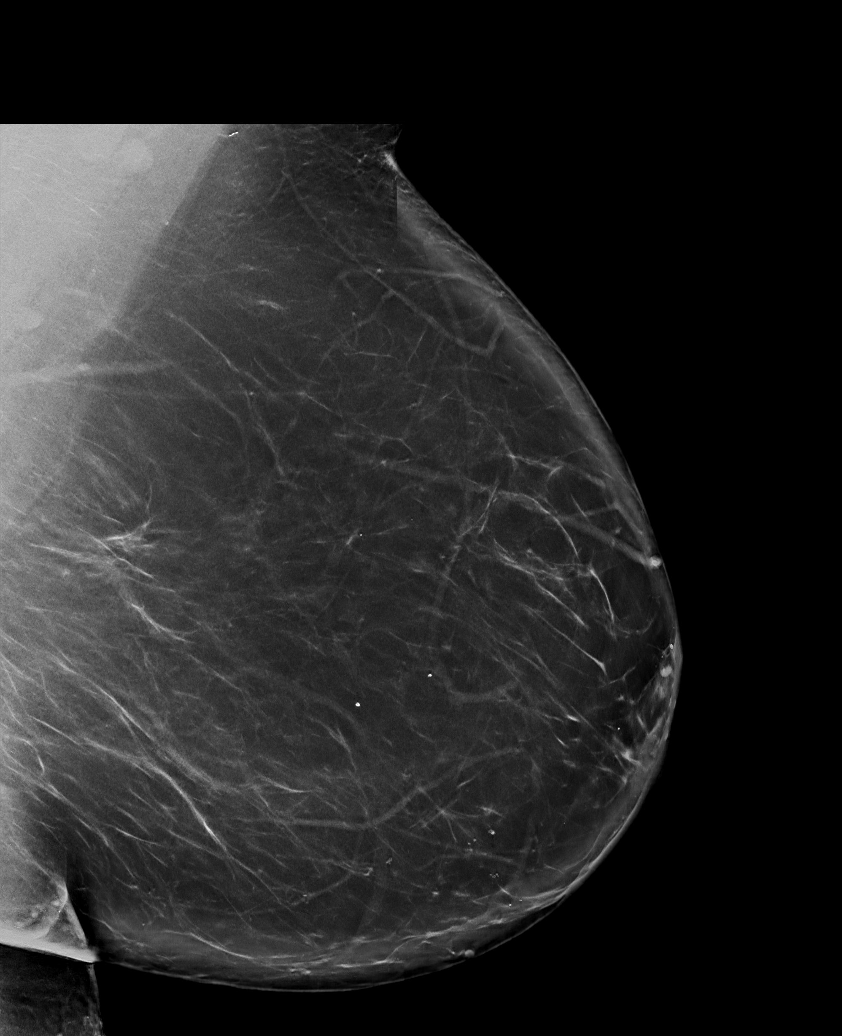

[R MLO synth-2D]
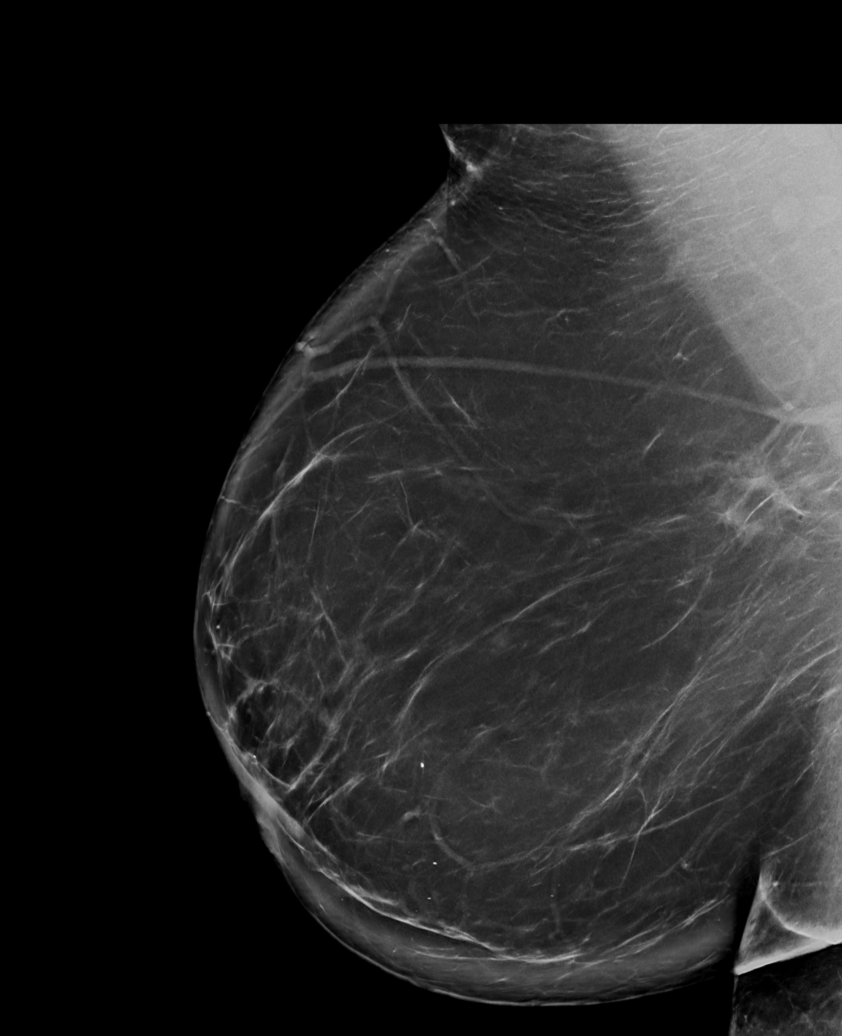

[R CC synth-2D (2 of 2)]
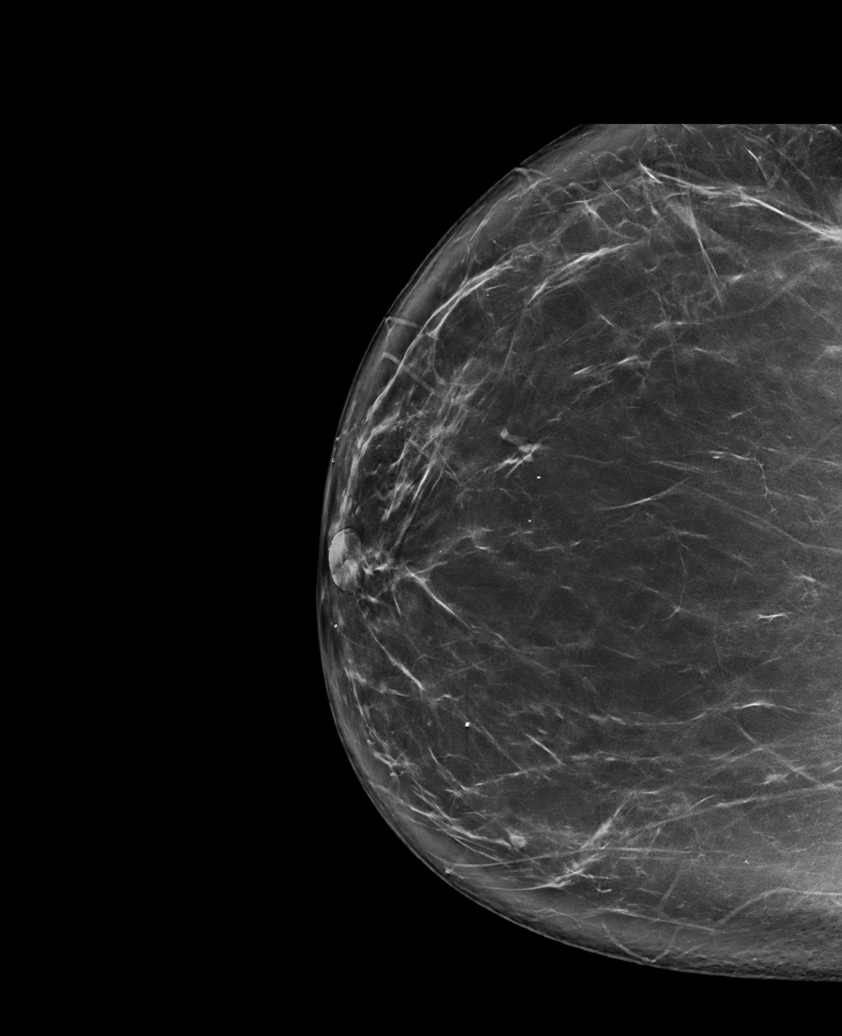

[L CC synth-2D (1 of 2)]
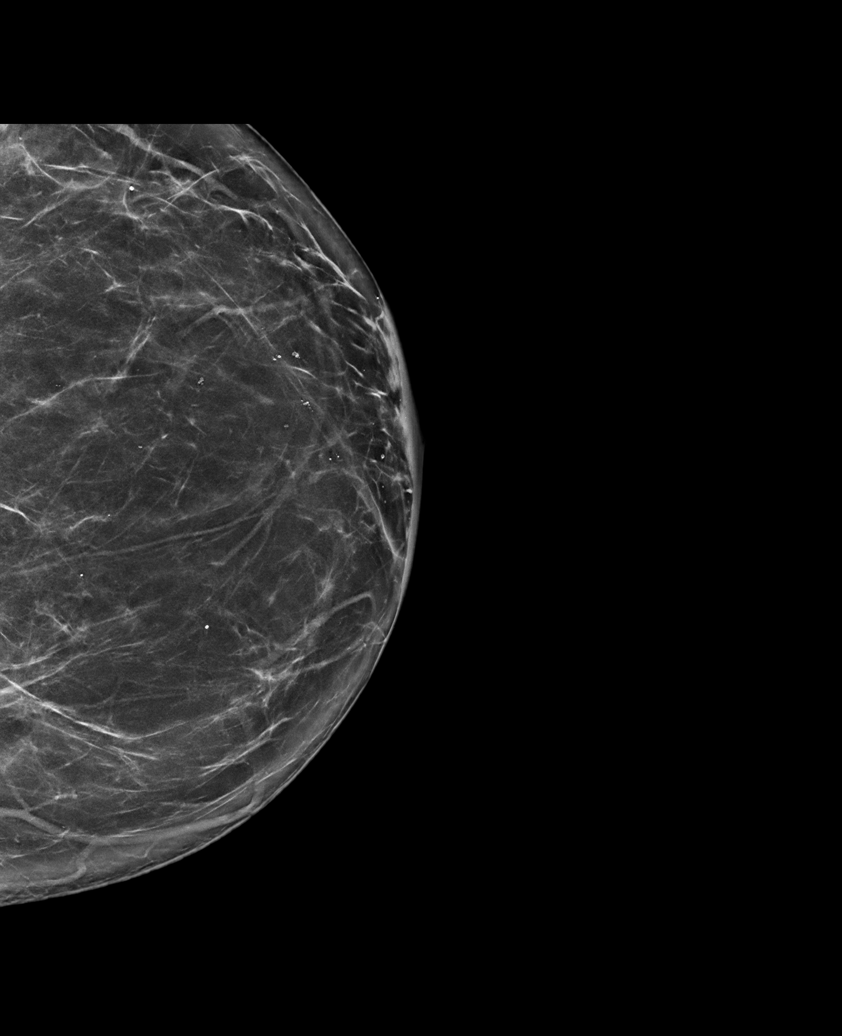

[L CC synth-2D (2 of 2)]
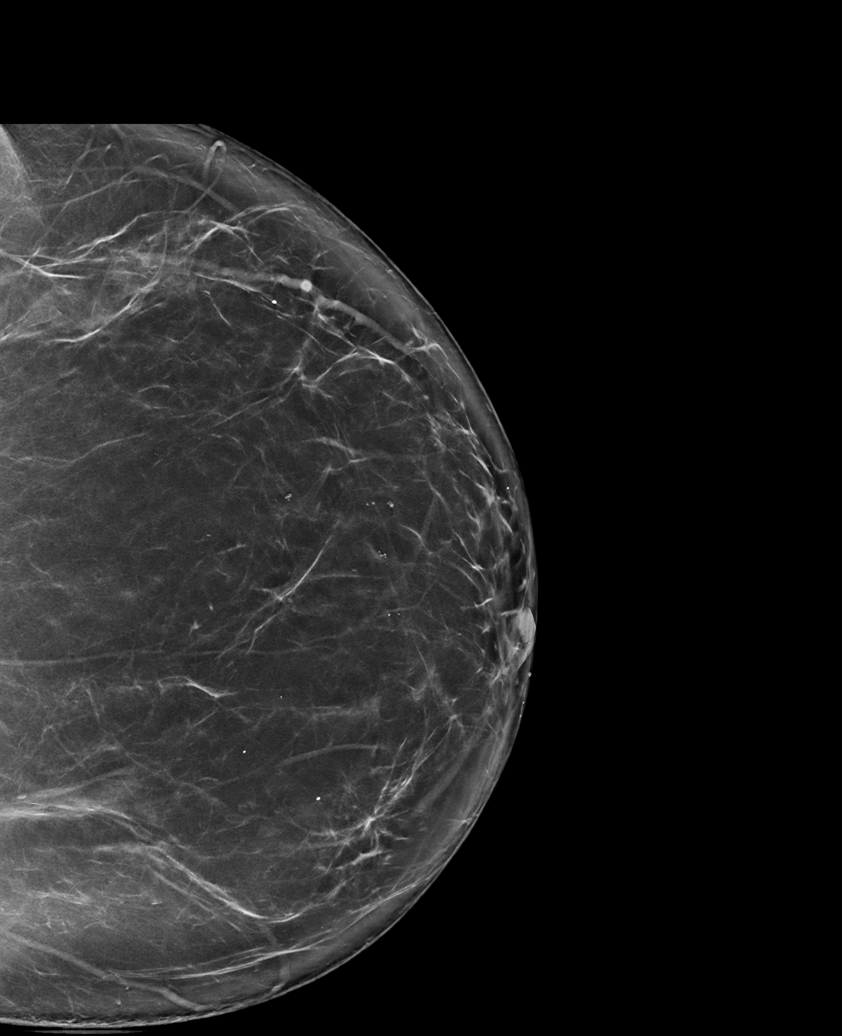

[6 of 36 positions shown; findings below may reference images not displayed]

ACR Breast Density Category b: There are scattered areas of
fibroglandular density.
FINDINGS: There are no findings suspicious for malignancy.
IMPRESSION: No mammographic evidence of malignancy. A result letter of this
screening mammogram will be mailed directly to the patient.

RECOMMENDATION:
Screening mammogram in one year. (Code:51-O-LD2)

BI-RADS CATEGORY  1: Negative.

## 2021-09-20 DIAGNOSIS — M9901 Segmental and somatic dysfunction of cervical region: Secondary | ICD-10-CM | POA: Diagnosis not present

## 2021-09-20 DIAGNOSIS — M41126 Adolescent idiopathic scoliosis, lumbar region: Secondary | ICD-10-CM | POA: Diagnosis not present

## 2021-09-20 DIAGNOSIS — M9907 Segmental and somatic dysfunction of upper extremity: Secondary | ICD-10-CM | POA: Diagnosis not present

## 2021-09-20 DIAGNOSIS — M40292 Other kyphosis, cervical region: Secondary | ICD-10-CM | POA: Diagnosis not present

## 2021-09-20 DIAGNOSIS — M9900 Segmental and somatic dysfunction of head region: Secondary | ICD-10-CM | POA: Diagnosis not present

## 2021-09-20 DIAGNOSIS — M4316 Spondylolisthesis, lumbar region: Secondary | ICD-10-CM | POA: Diagnosis not present

## 2021-09-20 DIAGNOSIS — M26602 Left temporomandibular joint disorder, unspecified: Secondary | ICD-10-CM | POA: Diagnosis not present

## 2021-09-20 DIAGNOSIS — M9903 Segmental and somatic dysfunction of lumbar region: Secondary | ICD-10-CM | POA: Diagnosis not present

## 2021-09-20 DIAGNOSIS — M9902 Segmental and somatic dysfunction of thoracic region: Secondary | ICD-10-CM | POA: Diagnosis not present

## 2021-09-25 DIAGNOSIS — M25551 Pain in right hip: Secondary | ICD-10-CM | POA: Diagnosis not present

## 2021-09-25 DIAGNOSIS — M5459 Other low back pain: Secondary | ICD-10-CM | POA: Diagnosis not present

## 2021-09-26 DIAGNOSIS — M9902 Segmental and somatic dysfunction of thoracic region: Secondary | ICD-10-CM | POA: Diagnosis not present

## 2021-09-26 DIAGNOSIS — M9903 Segmental and somatic dysfunction of lumbar region: Secondary | ICD-10-CM | POA: Diagnosis not present

## 2021-09-26 DIAGNOSIS — M9901 Segmental and somatic dysfunction of cervical region: Secondary | ICD-10-CM | POA: Diagnosis not present

## 2021-09-26 DIAGNOSIS — M26602 Left temporomandibular joint disorder, unspecified: Secondary | ICD-10-CM | POA: Diagnosis not present

## 2021-09-26 DIAGNOSIS — M9907 Segmental and somatic dysfunction of upper extremity: Secondary | ICD-10-CM | POA: Diagnosis not present

## 2021-09-26 DIAGNOSIS — M9900 Segmental and somatic dysfunction of head region: Secondary | ICD-10-CM | POA: Diagnosis not present

## 2021-10-02 DIAGNOSIS — M9902 Segmental and somatic dysfunction of thoracic region: Secondary | ICD-10-CM | POA: Diagnosis not present

## 2021-10-02 DIAGNOSIS — M26602 Left temporomandibular joint disorder, unspecified: Secondary | ICD-10-CM | POA: Diagnosis not present

## 2021-10-02 DIAGNOSIS — M9901 Segmental and somatic dysfunction of cervical region: Secondary | ICD-10-CM | POA: Diagnosis not present

## 2021-10-02 DIAGNOSIS — M9907 Segmental and somatic dysfunction of upper extremity: Secondary | ICD-10-CM | POA: Diagnosis not present

## 2021-10-02 DIAGNOSIS — M9900 Segmental and somatic dysfunction of head region: Secondary | ICD-10-CM | POA: Diagnosis not present

## 2021-10-02 DIAGNOSIS — M9903 Segmental and somatic dysfunction of lumbar region: Secondary | ICD-10-CM | POA: Diagnosis not present

## 2021-10-02 DIAGNOSIS — M5459 Other low back pain: Secondary | ICD-10-CM | POA: Diagnosis not present

## 2021-10-02 DIAGNOSIS — M25551 Pain in right hip: Secondary | ICD-10-CM | POA: Diagnosis not present

## 2021-10-03 ENCOUNTER — Encounter: Payer: Self-pay | Admitting: Internal Medicine

## 2021-10-11 DIAGNOSIS — G4733 Obstructive sleep apnea (adult) (pediatric): Secondary | ICD-10-CM | POA: Diagnosis not present

## 2021-10-12 DIAGNOSIS — M26602 Left temporomandibular joint disorder, unspecified: Secondary | ICD-10-CM | POA: Diagnosis not present

## 2021-10-12 DIAGNOSIS — M9901 Segmental and somatic dysfunction of cervical region: Secondary | ICD-10-CM | POA: Diagnosis not present

## 2021-10-12 DIAGNOSIS — M9907 Segmental and somatic dysfunction of upper extremity: Secondary | ICD-10-CM | POA: Diagnosis not present

## 2021-10-12 DIAGNOSIS — M9902 Segmental and somatic dysfunction of thoracic region: Secondary | ICD-10-CM | POA: Diagnosis not present

## 2021-10-12 DIAGNOSIS — M9900 Segmental and somatic dysfunction of head region: Secondary | ICD-10-CM | POA: Diagnosis not present

## 2021-10-12 DIAGNOSIS — M9903 Segmental and somatic dysfunction of lumbar region: Secondary | ICD-10-CM | POA: Diagnosis not present

## 2021-10-16 DIAGNOSIS — M5459 Other low back pain: Secondary | ICD-10-CM | POA: Diagnosis not present

## 2021-10-16 DIAGNOSIS — M25551 Pain in right hip: Secondary | ICD-10-CM | POA: Diagnosis not present

## 2021-10-25 ENCOUNTER — Encounter: Payer: Self-pay | Admitting: Internal Medicine

## 2021-10-25 ENCOUNTER — Ambulatory Visit (INDEPENDENT_AMBULATORY_CARE_PROVIDER_SITE_OTHER): Payer: BC Managed Care – PPO | Admitting: Internal Medicine

## 2021-10-25 ENCOUNTER — Other Ambulatory Visit: Payer: Self-pay

## 2021-10-25 VITALS — BP 122/84 | HR 104 | Temp 98.5°F | Ht 64.0 in | Wt 228.6 lb

## 2021-10-25 DIAGNOSIS — I10 Essential (primary) hypertension: Secondary | ICD-10-CM

## 2021-10-25 DIAGNOSIS — Z6839 Body mass index (BMI) 39.0-39.9, adult: Secondary | ICD-10-CM | POA: Diagnosis not present

## 2021-10-25 DIAGNOSIS — M5459 Other low back pain: Secondary | ICD-10-CM | POA: Diagnosis not present

## 2021-10-25 DIAGNOSIS — M25551 Pain in right hip: Secondary | ICD-10-CM | POA: Diagnosis not present

## 2021-10-25 NOTE — Patient Instructions (Signed)

## 2021-10-25 NOTE — Progress Notes (Signed)
[name],acting as a Education administrator for [name], MD.,have documented all relevant documentation on the behalf of [name], MD,as directed by  [name], MD while in the presence of [name], MD.  This visit occurred during the SARS-CoV-2 public health emergency.  Safety protocols were in place, including screening questions prior to the visit, additional usage of staff PPE, and extensive cleaning of exam room while observing appropriate contact time as indicated for disinfecting solutions.  Subjective:     Patient ID: Desiree Dunn , female    DOB: 02-Sep-1971 , 50 y.o.   MRN: [id number]   Chief Complaint  Patient presents with   Weight Check    HPI  Patient presents today for a weight check. She is tolerating the semaglutide well. She has no issues with meds. She is now walking regularly.     Past Medical History:  Diagnosis Date   Allergy    Anxiety    Back pain    Chronic RLQ pain    since 1995 when she had ectopic preg   Constipation    Deviated septum    Diverticulitis    Ectopic pregnancy 1995    x 1 , R side    Eczema    GERD (gastroesophageal reflux disease)    HTN (hypertension) 05/24/2015   Migraine without aura and without status migrainosus, not intractable    Miscarriage    x 1   Obesity, unspecified 07/06/2010   Pelvic congestion    h/o    Sleep apnea    uses cpap     Family History  Problem Relation Age of Onset   Colon cancer Mother 14   Colon cancer Maternal Aunt 69   Stroke Father    Diabetes Father    Hypertension Father    Hyperlipidemia Father    Heart disease Father    Obesity Father    Hypertension Sister    Colon polyps Brother    Colitis Brother    Depression Son    Asperger's syndrome Son    Breast cancer Paternal Grandmother    Prostate cancer Brother    Heart attack Neg Hx    Esophageal cancer Neg Hx    Rectal cancer Neg Hx    Stomach cancer Neg Hx      Current Outpatient Medications:     acetaminophen (TYLENOL) 500 MG tablet, Take 1,000 mg by mouth as needed for mild pain or headache., Disp: , Rfl:    albuterol (VENTOLIN HFA) 108 (90 Base) MCG/ACT inhaler, INHALE 2 PUFFS BY MOUTH EVERY 4 HOURS AS NEEDED, Disp: 18 each, Rfl: 1   Azelastine-Fluticasone 137-50 MCG/ACT SUSP, PLACE 1 SPRAY INTO THE NOSE 2 (TWO) TIMES DAILY., Disp: 23 g, Rfl: 5   EPINEPHrine (EPIPEN 2-PAK) 0.3 mg/0.3 mL IJ SOAJ injection, Inject 0.3 mg into the muscle as needed for anaphylaxis., Disp: 2 each, Rfl: 2   hydrochlorothiazide (MICROZIDE) 12.5 MG capsule, Take 1 capsule (12.5 mg total) by mouth daily., Disp: 90 capsule, Rfl: 3   ibuprofen (ADVIL,MOTRIN) 200 MG tablet, Take 400 mg by mouth as needed for moderate pain., Disp: , Rfl:    montelukast (SINGULAIR) 10 MG tablet, TAKE 1 TABLET BY MOUTH EVERYDAY AT BEDTIME, Disp: 90 tablet, Rfl: 1   olmesartan (BENICAR) 40 MG tablet, TAKE 1 TABLET BY MOUTH EVERY DAY, Disp: 90 tablet, Rfl: 1   oxymetazoline (AFRIN NASAL SPRAY) 0.05 % nasal spray, Place 2 sprays into both nostrils 2 (two) times  daily. Use no more than 3-5 days at a time., Disp: 30 mL, Rfl: 0   Semaglutide,0.25 or 0.5MG /DOS, (OZEMPIC, 0.25 OR 0.5 MG/DOSE,) 2 MG/1.5ML SOPN, Inject 0.5 mg as directed once a week., Disp: , Rfl:    TURMERIC PO, Take by mouth. AS NEEDED, Disp: , Rfl:    verapamil (CALAN-SR) 120 MG CR tablet, TAKE 1 TABLET BY MOUTH EVERY DAY, Disp: 90 tablet, Rfl: 1   doxycycline (VIBRA-TABS) 100 MG tablet, Take 1 tablet (100 mg total) by mouth 2 (two) times daily., Disp: 20 tablet, Rfl: 0   Allergies  Allergen Reactions   Amoxicillin Hives    Hives     Review of Systems  Constitutional: Negative.   Respiratory: Negative.    Cardiovascular: Negative.   Gastrointestinal: Negative.   Neurological: Negative.   Psychiatric/Behavioral: Negative.      Today's Vitals   10/25/21 1458  BP: 122/84  Pulse: (!) 104  Temp: 98.5 F (36.9 C)  Weight: 228 lb 9.6 oz (103.7 kg)  Height: 5\' 4"   (1.626 m)  PainSc: 0-No pain   Body mass index is 39.24 kg/m. Wt Readings from Last 3 Encounters:  10/25/21 228 lb 9.6 oz (103.7 kg)  08/28/21 234 lb (106.1 kg)  08/06/21 236 lb (107 kg)      Objective:  Physical Exam Vitals and nursing note reviewed.  Constitutional:      Appearance: Normal appearance.  HENT:     Head: Normocephalic and atraumatic.     Nose:     Comments: Masked     Mouth/Throat:     Comments: Masked  Eyes:     Extraocular Movements: Extraocular movements intact.  Cardiovascular:     Rate and Rhythm: Normal rate and regular rhythm.     Heart sounds: Normal heart sounds.  Pulmonary:     Effort: Pulmonary effort is normal.     Breath sounds: Normal breath sounds.  Skin:    General: Skin is warm.  Neurological:     General: No focal deficit present.     Mental Status: She is alert.  Psychiatric:        Mood and Affect: Mood normal.        Behavior: Behavior normal.        Assessment And Plan:     1. Class 2 severe obesity due to excess calories with serious comorbidity and body mass index (BMI) of 39.0 to 39.9 in adult [facility]) Comments: She was congratulated on her 6 lb weight loss since Oct 2022. She will c/w semaglutide 0.75mg  weekly. F/u in 8 weeks. Plan to send rx for [facility] in Jan to check for insurance approval.   2. Essential hypertension Comments: Chronic, well controlled. No med changes.     Patient was given opportunity to ask questions. Patient verbalized understanding of the plan and was able to repeat key elements of the plan. All questions were answered to their satisfaction.   I, [name], MD, have reviewed all documentation for this visit. The documentation on 10/25/21 for the exam, diagnosis, procedures, and orders are all accurate and complete.   IF YOU HAVE BEEN REFERRED TO A SPECIALIST, IT MAY TAKE 1-2 WEEKS TO SCHEDULE/PROCESS THE REFERRAL. IF YOU HAVE NOT HEARD FROM US/SPECIALIST IN TWO WEEKS, PLEASE GIVE [place] A CALL AT  [phone] X [phone].   THE PATIENT IS ENCOURAGED TO PRACTICE SOCIAL DISTANCING DUE TO THE COVID-19 PANDEMIC.

## 2021-10-26 ENCOUNTER — Encounter: Payer: Self-pay | Admitting: Internal Medicine

## 2021-10-26 ENCOUNTER — Other Ambulatory Visit: Payer: Self-pay

## 2021-10-26 DIAGNOSIS — Z20822 Contact with and (suspected) exposure to covid-19: Secondary | ICD-10-CM | POA: Diagnosis not present

## 2021-10-26 MED ORDER — NOREL AD 4-10-325 MG PO TABS: ORAL_TABLET | ORAL | 3 refills | Status: DC

## 2021-10-27 ENCOUNTER — Encounter: Payer: Self-pay | Admitting: Internal Medicine

## 2021-10-31 DIAGNOSIS — M9902 Segmental and somatic dysfunction of thoracic region: Secondary | ICD-10-CM | POA: Diagnosis not present

## 2021-10-31 DIAGNOSIS — M9903 Segmental and somatic dysfunction of lumbar region: Secondary | ICD-10-CM | POA: Diagnosis not present

## 2021-10-31 DIAGNOSIS — M9907 Segmental and somatic dysfunction of upper extremity: Secondary | ICD-10-CM | POA: Diagnosis not present

## 2021-10-31 DIAGNOSIS — M9901 Segmental and somatic dysfunction of cervical region: Secondary | ICD-10-CM | POA: Diagnosis not present

## 2021-10-31 DIAGNOSIS — M9900 Segmental and somatic dysfunction of head region: Secondary | ICD-10-CM | POA: Diagnosis not present

## 2021-10-31 DIAGNOSIS — M26602 Left temporomandibular joint disorder, unspecified: Secondary | ICD-10-CM | POA: Diagnosis not present

## 2021-11-01 DIAGNOSIS — M5459 Other low back pain: Secondary | ICD-10-CM | POA: Diagnosis not present

## 2021-11-01 DIAGNOSIS — M25551 Pain in right hip: Secondary | ICD-10-CM | POA: Diagnosis not present

## 2021-11-02 DIAGNOSIS — M9902 Segmental and somatic dysfunction of thoracic region: Secondary | ICD-10-CM | POA: Diagnosis not present

## 2021-11-02 DIAGNOSIS — M26602 Left temporomandibular joint disorder, unspecified: Secondary | ICD-10-CM | POA: Diagnosis not present

## 2021-11-02 DIAGNOSIS — M9903 Segmental and somatic dysfunction of lumbar region: Secondary | ICD-10-CM | POA: Diagnosis not present

## 2021-11-02 DIAGNOSIS — M9907 Segmental and somatic dysfunction of upper extremity: Secondary | ICD-10-CM | POA: Diagnosis not present

## 2021-11-02 DIAGNOSIS — M9900 Segmental and somatic dysfunction of head region: Secondary | ICD-10-CM | POA: Diagnosis not present

## 2021-11-02 DIAGNOSIS — M4316 Spondylolisthesis, lumbar region: Secondary | ICD-10-CM | POA: Diagnosis not present

## 2021-11-02 DIAGNOSIS — M9901 Segmental and somatic dysfunction of cervical region: Secondary | ICD-10-CM | POA: Diagnosis not present

## 2021-11-10 DIAGNOSIS — G4733 Obstructive sleep apnea (adult) (pediatric): Secondary | ICD-10-CM | POA: Diagnosis not present

## 2021-11-14 ENCOUNTER — Telehealth: Payer: Self-pay

## 2021-11-14 DIAGNOSIS — M5459 Other low back pain: Secondary | ICD-10-CM | POA: Diagnosis not present

## 2021-11-14 DIAGNOSIS — M25551 Pain in right hip: Secondary | ICD-10-CM | POA: Diagnosis not present

## 2021-11-14 DIAGNOSIS — M9902 Segmental and somatic dysfunction of thoracic region: Secondary | ICD-10-CM | POA: Diagnosis not present

## 2021-11-14 DIAGNOSIS — M26602 Left temporomandibular joint disorder, unspecified: Secondary | ICD-10-CM | POA: Diagnosis not present

## 2021-11-14 DIAGNOSIS — M9901 Segmental and somatic dysfunction of cervical region: Secondary | ICD-10-CM | POA: Diagnosis not present

## 2021-11-14 DIAGNOSIS — M9900 Segmental and somatic dysfunction of head region: Secondary | ICD-10-CM | POA: Diagnosis not present

## 2021-11-14 DIAGNOSIS — M9903 Segmental and somatic dysfunction of lumbar region: Secondary | ICD-10-CM | POA: Diagnosis not present

## 2021-11-14 DIAGNOSIS — M9907 Segmental and somatic dysfunction of upper extremity: Secondary | ICD-10-CM | POA: Diagnosis not present

## 2021-11-14 NOTE — Telephone Encounter (Signed)
We received a call from [facility] at Dr Oneal Grout office regarding this patient. She was given the number to their office by her dentist, Dr Arleta Creek, to get an oral appliance for her sleep apnea. Per Michele Mcalpine, they need a referral from a physician, including past sleep study results and office notes before getting the patient scheduled for an appointment. The patient advised them that Dr Brett Fairy is the one who diagnosed her sleep apnea. Can you please advise if we are able to get this referral set up for the patient?  Michele Mcalpine would also like a call back with an update regarding this so she can keep the patient in the loop. Her call back number is [phone]  Thanks!

## 2021-11-14 NOTE — Telephone Encounter (Signed)
I would like to repeat a HST, and send that new result also to Dr Ron Parker. Would the patient be willing ? CD

## 2021-11-14 NOTE — Telephone Encounter (Signed)
The patient underwent in August 2019 an in -lab study , SPLIT night with poor outcome as she didn't sleep much, especially on CPAP.  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 Repeated HST after surgery, to my surprise that showed still AHI of over 30/h. NO REM versus NREM differentiation was possible in that HST.  I had ordered  heated -humidified auto CPAP, 6-16 cm water, 1 cm EPR. Nasal pillow.    Please send Dr Ron Parker the 2 sleep studies.  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   Has BMI changed in those years? I can't find a recent BMI documentation.  Medical history? Medication changes?    Larey Seat, MD

## 2021-11-15 ENCOUNTER — Ambulatory Visit: Payer: BC Managed Care – PPO | Admitting: Internal Medicine

## 2021-11-23 ENCOUNTER — Ambulatory Visit: Payer: BC Managed Care – PPO | Admitting: Allergy

## 2021-11-23 ENCOUNTER — Ambulatory Visit: Payer: BC Managed Care – PPO | Admitting: Plastic Surgery

## 2021-11-27 ENCOUNTER — Other Ambulatory Visit: Payer: Self-pay | Admitting: *Deleted

## 2021-11-27 DIAGNOSIS — M25551 Pain in right hip: Secondary | ICD-10-CM | POA: Diagnosis not present

## 2021-11-27 DIAGNOSIS — Z9989 Dependence on other enabling machines and devices: Secondary | ICD-10-CM

## 2021-11-27 DIAGNOSIS — M5459 Other low back pain: Secondary | ICD-10-CM | POA: Diagnosis not present

## 2021-11-27 DIAGNOSIS — G4733 Obstructive sleep apnea (adult) (pediatric): Secondary | ICD-10-CM

## 2021-12-02 ENCOUNTER — Other Ambulatory Visit: Payer: Self-pay | Admitting: Internal Medicine

## 2021-12-07 ENCOUNTER — Other Ambulatory Visit: Payer: Self-pay | Admitting: Internal Medicine

## 2021-12-13 ENCOUNTER — Encounter: Payer: Self-pay | Admitting: Adult Health

## 2021-12-13 ENCOUNTER — Ambulatory Visit (INDEPENDENT_AMBULATORY_CARE_PROVIDER_SITE_OTHER): Payer: BC Managed Care – PPO | Admitting: Adult Health

## 2021-12-13 VITALS — BP 124/86 | HR 92 | Ht 64.0 in | Wt 233.0 lb

## 2021-12-13 DIAGNOSIS — Z9989 Dependence on other enabling machines and devices: Secondary | ICD-10-CM | POA: Diagnosis not present

## 2021-12-13 DIAGNOSIS — G4733 Obstructive sleep apnea (adult) (pediatric): Secondary | ICD-10-CM | POA: Diagnosis not present

## 2021-12-13 NOTE — Progress Notes (Signed)
PATIENT: Desiree Dunn DOB: 04/14/1971  REASON FOR VISIT: follow up HISTORY FROM: patient  HISTORY OF PRESENT ILLNESS: Today 12/13/21:  [name] is a 51 year old female with a history of obstructive sleep apnea on CPAP.  She returns today for follow-up.  She reports that the CPAP is working well for her.  Recently diagnosed with TMJ and feels like that is impacted her usage time.  Report is below    12/12/20: [name] is a 51 year old female with a history of obstructive sleep apnea on CPAP.  She reports that the CPAP is working well for her.  She denies any issues with using her machine.  She returns today for follow-up.  Compliance Report: Compliance  Usage 11/12/2020 - 12/11/2020 Usage days 28/30 days (93%) >= 4 hours 26 days (87%) Average usage (days used) 6 hours 1 minutes  AirSense 10 AutoSet For Her Serial number [id number] Mode AutoSet for Her Min Pressure 6 cmH2O Max Pressure 16 cmH2O EPR Fulltime EPR level 3  Therapy Pressure - cmH2O Median: 8.1 95th percentile: 9.7 Maximum: 10.3 Leaks - L/min Median: 0.0 95th percentile: 0.7 Maximum: 5.2 Events per hour AI: 0.7 HI: 0.2 AHI: 0.9 Apnea Index Central: 0.2 Obstructive: 0.5 Unknown: 0.0   12/10/19: [name] is a 51 year old female with a history of obstructive sleep apnea on CPAP.  She returns today for follow-up.  Her CPAP download indicates that she has been using her machine 25 out of 30 days for compliance of 83%.  She use her machine greater than 4 hours 24 days for compliance of 80%.  On average she uses her machine 5 hours and 56 minutes.  Her residual AHI is 1.5 on 6 to 16 cm of water with EPR of 3.  Her leak in the 95th percentile is 0.6 L/min.  She states that since she has been using the CPAP more consistently she can tell that she feels more rested during the day.  She reports that she is also trying to exercise and lose weight.  She returns today for an  evaluation.  HISTORY 06/04/19:   [name] is a 51 year old female with a history of obstructive sleep apnea on CPAP.  She states that in December she had her deviated septum repair.  She reports that since then she has not been using the CPAP consistently.  She states that she has a hard time wearing the full facemask.  She also notes that her husband states that she does not snore nearly as much since she had the surgery.  Her Epworth sleepiness score is 3 and fatigue severity score is 23.  She returns today for follow-up.  REVIEW OF SYSTEMS: Out of a complete 14 system review of symptoms, the patient complains only of the following symptoms, and all other reviewed systems are negative.  Epworth sleepiness scale: 3 Fatigue severity score 55--patient feels that it is related to menopause ALLERGIES: Allergies  Allergen Reactions   Amoxicillin Hives    Hives    HOME MEDICATIONS: Outpatient Medications Prior to Visit  Medication Sig Dispense Refill   acetaminophen (TYLENOL) 500 MG tablet Take 1,000 mg by mouth as needed for mild pain or headache.     albuterol (VENTOLIN HFA) 108 (90 Base) MCG/ACT inhaler INHALE 2 PUFFS BY MOUTH EVERY 4 HOURS AS NEEDED 18 each 1   Azelastine-Fluticasone 137-50 MCG/ACT SUSP PLACE 1 SPRAY INTO THE NOSE 2 (TWO) TIMES DAILY. 23 g 5   Chlorphen-PE-Acetaminophen (NOREL AD) 4-10-325  MG TABS Take one tablet every four hours as needed. 84 tablet 3   EPINEPHrine (EPIPEN 2-PAK) 0.3 mg/0.3 mL IJ SOAJ injection Inject 0.3 mg into the muscle as needed for anaphylaxis. 2 each 2   hydrochlorothiazide (MICROZIDE) 12.5 MG capsule TAKE 1 CAPSULE BY MOUTH EVERY DAY 90 capsule 3   ibuprofen (ADVIL,MOTRIN) 200 MG tablet Take 400 mg by mouth as needed for moderate pain.     montelukast (SINGULAIR) 10 MG tablet TAKE 1 TABLET BY MOUTH EVERYDAY AT BEDTIME 90 tablet 1   olmesartan (BENICAR) 40 MG tablet TAKE 1 TABLET BY MOUTH EVERY DAY 90 tablet 1   oxymetazoline (AFRIN  NASAL SPRAY) 0.05 % nasal spray Place 2 sprays into both nostrils 2 (two) times daily. Use no more than 3-5 days at a time. 30 mL 0   Semaglutide,0.25 or 0.5MG /DOS, (OZEMPIC, 0.25 OR 0.5 MG/DOSE,) 2 MG/1.5ML SOPN Inject 0.5 mg as directed once a week.     TURMERIC PO Take by mouth. AS NEEDED     verapamil (CALAN-SR) 120 MG CR tablet TAKE 1 TABLET BY MOUTH EVERY DAY 90 tablet 1   No facility-administered medications prior to visit.    PAST MEDICAL HISTORY: Past Medical History:  Diagnosis Date   Allergy    Anxiety    Back pain    Chronic RLQ pain    since 1995 when she had ectopic preg   Constipation    Deviated septum    Diverticulitis    Ectopic pregnancy 1995   x 1 , R side    Eczema    GERD (gastroesophageal reflux disease)    HTN (hypertension) 05/24/2015   Migraine without aura and without status migrainosus, not intractable    Miscarriage    x 1   Obesity, unspecified 07/06/2010   OSA (obstructive sleep apnea)    Pelvic congestion    h/o    Sleep apnea    uses cpap    PAST SURGICAL HISTORY: Past Surgical History:  Procedure Laterality Date   ABDOMINAL HYSTERECTOMY  12/2009   [facility]   BREAST REDUCTION SURGERY  10/2008   COLONOSCOPY  2001   X3 ; diverticulosis   FOOT SURGERY     NASAL SEPTOPLASTY W/ TURBINOPLASTY Bilateral 10/31/2018   Procedure: NASAL SEPTOPLASTY WITH TURBINATE REDUCTION;  Surgeon: Jerrell Belfast, MD;  Location: [facility];  Service: ENT;  Laterality: Bilateral;   PARTIAL HYSTERECTOMY     POLYPECTOMY     HPP    REDUCTION MAMMAPLASTY Bilateral 10/2008   RIGHT OOPHORECTOMY  12/2009   [facility]   TRIGGER FINGER RELEASE Right 04/28/2021   middle finger   TUBAL LIGATION  2000    FAMILY HISTORY: Family History  Problem Relation Age of Onset   Colon cancer Mother 83   Colon cancer Maternal Aunt 47   Stroke Father    Diabetes Father    Hypertension Father    Hyperlipidemia Father    Heart disease Father    Obesity Father    Hypertension  Sister    Colon polyps Brother    Colitis Brother    Depression Son    Asperger's syndrome Son    Breast cancer Paternal Grandmother    Prostate cancer Brother    Heart attack Neg Hx    Esophageal cancer Neg Hx    Rectal cancer Neg Hx    Stomach cancer Neg Hx     SOCIAL HISTORY: Social History   Socioeconomic History   Marital status: Married  Spouse name: Juanda Crumble   Number of children: 2   Years of education: Not on file   Highest education level: Not on file  Occupational History   Occupation: Counsellor, Par time   Occupation: works full time at [employer]: [place] Use   Smoking status: Never   Smokeless tobacco: Never  Vaping Use   Vaping Use: Never used  Substance and Sexual Activity   Alcohol use: Yes    Alcohol/week: 0.0 standard drinks    Comment: socially - 1 drink every 3 months   Drug use: No   Sexual activity: Yes    Partners: Male    Birth control/protection: Surgical  Other Topics Concern   Not on file  Social History Narrative   Household-- pt , husband and children   daughter 56   son 45   Social Determinants of Radio broadcast assistant Strain: Not on file  Food Insecurity: Not on file  Transportation Needs: Not on file  Physical Activity: Not on file  Stress: Not on file  Social Connections: Not on file  Intimate Partner Violence: Not on file      PHYSICAL EXAM  Vitals:   12/13/21 1437  BP: 124/86  Pulse: 92  SpO2: 96%  Weight: 233 lb (105.7 kg)  Height: 5\' 4"  (1.626 m)   Body mass index is 39.99 kg/m.  Generalized: Well developed, in no acute distress  Chest: Lungs clear to auscultation bilaterally  Neurological examination  Mentation: Alert oriented to time, place, history taking. Follows all commands speech and language fluent Cranial nerve II-XII: Extraocular movements were full, visual field were full on confrontational test Head turning and shoulder shrug  were normal and  symmetric. Motor: The motor testing reveals 5 over 5 strength of all 4 extremities. Good symmetric motor tone is noted throughout.  Sensory: Sensory testing is intact to soft touch on all 4 extremities. No evidence of extinction is noted.  Gait and station: Gait is normal.     DIAGNOSTIC DATA (LABS, IMAGING, TESTING) - I reviewed patient records, labs, notes, testing and imaging myself where available.  Lab Results  Component Value Date   WBC 4.5 05/08/2021   HGB 13.1 05/08/2021   HCT 39.6 05/08/2021   MCV 91 05/08/2021   PLT 246 05/08/2021      Component Value Date/Time   NA 141 08/28/2021 1623   K 3.6 08/28/2021 1623   CL 103 08/28/2021 1623   CO2 20 08/28/2021 1623   GLUCOSE 82 08/28/2021 1623   GLUCOSE 91 10/23/2018 0831   BUN 10 08/28/2021 1623   CREATININE 0.97 08/28/2021 1623   CALCIUM 9.2 08/28/2021 1623   PROT 7.5 05/08/2021 0956   ALBUMIN 4.8 05/08/2021 0956   AST 41 (H) 05/08/2021 0956   ALT 61 (H) 05/08/2021 0956   ALKPHOS 77 05/08/2021 0956   BILITOT 0.3 05/08/2021 0956   GFRNONAA 72 12/01/2020 1732   GFRAA 83 12/01/2020 1732   Lab Results  Component Value Date   CHOL 199 05/08/2021   HDL 60 05/08/2021   LDLCALC 116 (H) 05/08/2021   TRIG 132 05/08/2021   CHOLHDL 3.3 05/08/2021   Lab Results  Component Value Date   HGBA1C 5.7 (H) 08/28/2021   Lab Results  Component Value Date   VITAMINB12 1,192 12/01/2020   Lab Results  Component Value Date   TSH 1.150 04/28/2020      ASSESSMENT AND PLAN 51 y.o. year old female  has a past medical history of Allergy, Anxiety, Back pain, Chronic RLQ pain, Constipation, Deviated septum, Diverticulitis, Ectopic pregnancy (1995), Eczema, GERD (gastroesophageal reflux disease), HTN (hypertension) (05/24/2015), Migraine without aura and without status migrainosus, not intractable, Miscarriage, Obesity, unspecified (07/06/2010), OSA (obstructive sleep apnea), Pelvic congestion, and Sleep apnea. here  with:  Obstructive sleep apnea on CPAP  -CPAP compliance is Suboptimal -Good treatment of apnea when using the machine -Encourage patient use CPAP nightly and greater than 4 hours each night -Follow-up in 1 year or sooner if needed    Ward Givens, MSN, NP-C 12/13/2021, 2:41 PM [facility] [address] [place], [place] [address] [phone]

## 2021-12-13 NOTE — Patient Instructions (Signed)
Continue using CPAP nightly and greater than 4 hours each night °If your symptoms worsen or you develop new symptoms please let us know.  ° °

## 2021-12-14 ENCOUNTER — Other Ambulatory Visit: Payer: Self-pay

## 2021-12-14 ENCOUNTER — Ambulatory Visit (INDEPENDENT_AMBULATORY_CARE_PROVIDER_SITE_OTHER): Payer: BC Managed Care – PPO | Admitting: Podiatry

## 2021-12-14 ENCOUNTER — Encounter: Payer: Self-pay | Admitting: Podiatry

## 2021-12-14 DIAGNOSIS — M7671 Peroneal tendinitis, right leg: Secondary | ICD-10-CM

## 2021-12-14 MED ORDER — TRIAMCINOLONE ACETONIDE 10 MG/ML IJ SUSP: 10.0000 mg | Freq: Once | INTRAMUSCULAR | Status: DC

## 2021-12-15 NOTE — Progress Notes (Signed)
Subjective:   Patient ID: Desiree Dunn, female   DOB: 51 y.o.   MRN: [id number]   HPI Patient presents stating around the Achilles seems to be improved but I am getting discomfort now behind the ankle right and that just started in the last   ROS      Objective:  Physical Exam  Neurovascular status intact with improvement around the right Achilles insertion but I did note inflammation and pain around the peroneal tendon as it comes underneath the lateral malleolus     Assessment:  Achilles tendinitis improved but does appear to have peroneal tendinitis right     Plan:  H&P reviewed both conditions and I did careful injection of the peroneal sheath as it is acute right 3 mg Kenalog 5 mg Xylocaine discussed boot usage and reduced activity.  Patient will be seen back to recheck and I may get an MRI if symptoms persist

## 2021-12-25 ENCOUNTER — Other Ambulatory Visit: Payer: Self-pay

## 2021-12-25 ENCOUNTER — Other Ambulatory Visit: Payer: Self-pay | Admitting: Internal Medicine

## 2021-12-25 ENCOUNTER — Encounter: Payer: Self-pay | Admitting: Internal Medicine

## 2021-12-25 ENCOUNTER — Ambulatory Visit (INDEPENDENT_AMBULATORY_CARE_PROVIDER_SITE_OTHER): Payer: BC Managed Care – PPO | Admitting: Internal Medicine

## 2021-12-25 VITALS — BP 124/70 | HR 98 | Temp 99.1°F | Ht 64.0 in | Wt 230.0 lb

## 2021-12-25 DIAGNOSIS — Z23 Encounter for immunization: Secondary | ICD-10-CM

## 2021-12-25 DIAGNOSIS — I10 Essential (primary) hypertension: Secondary | ICD-10-CM | POA: Diagnosis not present

## 2021-12-25 DIAGNOSIS — E8881 Metabolic syndrome: Secondary | ICD-10-CM

## 2021-12-25 DIAGNOSIS — J309 Allergic rhinitis, unspecified: Secondary | ICD-10-CM | POA: Diagnosis not present

## 2021-12-25 DIAGNOSIS — R0982 Postnasal drip: Secondary | ICD-10-CM | POA: Diagnosis not present

## 2021-12-25 DIAGNOSIS — Z6839 Body mass index (BMI) 39.0-39.9, adult: Secondary | ICD-10-CM

## 2021-12-25 MED ORDER — WEGOVY 0.5 MG/0.5ML ~~LOC~~ SOAJ: 0.5000 mg | SUBCUTANEOUS | 0 refills | Status: DC

## 2021-12-25 NOTE — Progress Notes (Signed)
I,Katawbba Wiggins,acting as a Education administrator for [name], MD.,have documented all relevant documentation on the behalf of [name], MD,as directed by  [name], MD while in the presence of [name], MD.  This visit occurred during the SARS-CoV-2 public health emergency.  Safety protocols were in place, including screening questions prior to the visit, additional usage of staff PPE, and extensive cleaning of exam room while observing appropriate contact time as indicated for disinfecting solutions.  Subjective:     Patient ID: Desiree Dunn , female    DOB: Oct 26, 1971 , 51 y.o.   MRN: [id number]   Chief Complaint  Patient presents with   Hypertension   Obesity    HPI  Patient presents today for a blood pressure  and f/u weight check. She reports compliance with meds. She denies headaches, chest pain and shortness of breath.   Hypertension This is a chronic problem. The current episode started more than 1 year ago. The problem has been gradually improving since onset. The problem is controlled. Pertinent negatives include no blurred vision, chest pain, palpitations or shortness of breath. Risk factors for coronary artery disease include obesity and sedentary lifestyle. Past treatments include angiotensin blockers and calcium channel blockers. The current treatment provides moderate improvement.    Past Medical History:  Diagnosis Date   Allergy    Anxiety    Back pain    Chronic RLQ pain    since 1995 when she had ectopic preg   Constipation    Deviated septum    Diverticulitis    Ectopic pregnancy 1995   x 1 , R side    Eczema    GERD (gastroesophageal reflux disease)    HTN (hypertension) 05/24/2015   Migraine without aura and without status migrainosus, not intractable    Miscarriage    x 1   Obesity, unspecified 07/06/2010   OSA (obstructive sleep apnea)    Pelvic congestion    h/o    Sleep apnea    uses cpap     Family History  Problem  Relation Age of Onset   Colon cancer Mother 60   Colon cancer Maternal Aunt 25   Stroke Father    Diabetes Father    Hypertension Father    Hyperlipidemia Father    Heart disease Father    Obesity Father    Hypertension Sister    Colon polyps Brother    Colitis Brother    Depression Son    Asperger's syndrome Son    Breast cancer Paternal Grandmother    Prostate cancer Brother    Heart attack Neg Hx    Esophageal cancer Neg Hx    Rectal cancer Neg Hx    Stomach cancer Neg Hx      Current Outpatient Medications:    acetaminophen (TYLENOL) 500 MG tablet, Take 1,000 mg by mouth as needed for mild pain or headache., Disp: , Rfl:    albuterol (VENTOLIN HFA) 108 (90 Base) MCG/ACT inhaler, INHALE 2 PUFFS BY MOUTH EVERY 4 HOURS AS NEEDED, Disp: 18 each, Rfl: 1   Azelastine-Fluticasone 137-50 MCG/ACT SUSP, PLACE 1 SPRAY INTO THE NOSE 2 (TWO) TIMES DAILY., Disp: 23 g, Rfl: 5   Chlorphen-PE-Acetaminophen (NOREL AD) 4-10-325 MG TABS, Take one tablet every four hours as needed., Disp: 84 tablet, Rfl: 3   EPINEPHrine (EPIPEN 2-PAK) 0.3 mg/0.3 mL IJ SOAJ injection, Inject 0.3 mg into the muscle as needed for anaphylaxis., Disp: 2 each, Rfl: 2   hydrochlorothiazide (  MICROZIDE) 12.5 MG capsule, TAKE 1 CAPSULE BY MOUTH EVERY DAY, Disp: 90 capsule, Rfl: 3   ibuprofen (ADVIL,MOTRIN) 200 MG tablet, Take 400 mg by mouth as needed for moderate pain., Disp: , Rfl:    montelukast (SINGULAIR) 10 MG tablet, TAKE 1 TABLET BY MOUTH EVERYDAY AT BEDTIME, Disp: 90 tablet, Rfl: 1   olmesartan (BENICAR) 40 MG tablet, TAKE 1 TABLET BY MOUTH EVERY DAY, Disp: 90 tablet, Rfl: 1   oxymetazoline (AFRIN NASAL SPRAY) 0.05 % nasal spray, Place 2 sprays into both nostrils 2 (two) times daily. Use no more than 3-5 days at a time., Disp: 30 mL, Rfl: 0   Semaglutide,0.25 or 0.5MG /DOS, (OZEMPIC, 0.25 OR 0.5 MG/DOSE,) 2 MG/1.5ML SOPN, Inject 0.5 mg as directed once a week., Disp: , Rfl:    Semaglutide-Weight Management (WEGOVY)  0.5 MG/0.5ML SOAJ, Inject 0.5 mg into the skin once a week., Disp: 2 mL, Rfl: 0   TURMERIC PO, Take by mouth. AS NEEDED, Disp: , Rfl:    verapamil (CALAN-SR) 120 MG CR tablet, TAKE 1 TABLET BY MOUTH EVERY DAY, Disp: 90 tablet, Rfl: 1   Allergies  Allergen Reactions   Amoxicillin Hives    Hives     Review of Systems  Constitutional: Negative.   HENT:  Positive for postnasal drip and rhinorrhea.   Eyes:  Negative for blurred vision.  Respiratory:  Positive for cough. Negative for shortness of breath.   Cardiovascular: Negative.  Negative for chest pain and palpitations.  Gastrointestinal: Negative.   Psychiatric/Behavioral: Negative.    All other systems reviewed and are negative.   Today's Vitals   12/25/21 1435  BP: 124/70  Pulse: 98  Temp: 99.1 F (37.3 C)  Weight: 230 lb (104.3 kg)  Height: 5\' 4"  (1.626 m)   Body mass index is 39.48 kg/m.  Wt Readings from Last 3 Encounters:  12/25/21 230 lb (104.3 kg)  12/13/21 233 lb (105.7 kg)  10/25/21 228 lb 9.6 oz (103.7 kg)    BP Readings from Last 3 Encounters:  12/25/21 124/70  12/13/21 124/86  10/25/21 122/84    Objective:  Physical Exam Vitals and nursing note reviewed.  Constitutional:      Appearance: Normal appearance.  HENT:     Head: Normocephalic and atraumatic.     Nose:     Comments: Masked     Mouth/Throat:     Comments: Masked  Eyes:     Extraocular Movements: Extraocular movements intact.  Cardiovascular:     Rate and Rhythm: Normal rate and regular rhythm.     Heart sounds: Normal heart sounds.  Pulmonary:     Effort: Pulmonary effort is normal.     Breath sounds: Normal breath sounds.  Musculoskeletal:     Cervical back: Normal range of motion.  Skin:    General: Skin is warm.  Neurological:     General: No focal deficit present.     Mental Status: She is alert.  Psychiatric:        Mood and Affect: Mood normal.        Behavior: Behavior normal.     Assessment And Plan:     1.  Essential hypertension Comments: Chronic, well controlled. NO med changes. Advised to follow low sodium diet.  I will check renal function.  - CMP14+EGFR  2. Allergic rhinitis with postnasal drip Comments: Chronic, as per Allergy. She will c/w Astepro along with montelukast. May need to take Zyrtec along with her current regimen.   3. Insulin  resistance Comments: I will send rx semaglutide 0.$RemoveBeforeDE'5mg'[id number]$  weekly to start PA process.  - Hemoglobin A1c  4. Class 2 severe obesity due to excess calories with serious comorbidity and body mass index (BMI) of 39.0 to 39.9 in adult [facility]) Comments: She is encouraged to aim for at least 150 minutes of exercise per week.  Will send rx Wegovy weekly to start PA process.   5. Need for vaccination - Varicella-zoster vaccine IM (Shingrix)  Patient was given opportunity to ask questions. Patient verbalized understanding of the plan and was able to repeat key elements of the plan. All questions were answered to their satisfaction.   I, [name], MD, have reviewed all documentation for this visit. The documentation on 12/25/21 for the exam, diagnosis, procedures, and orders are all accurate and complete.   IF YOU HAVE BEEN REFERRED TO A SPECIALIST, IT MAY TAKE 1-2 WEEKS TO SCHEDULE/PROCESS THE REFERRAL. IF YOU HAVE NOT HEARD FROM US/SPECIALIST IN TWO WEEKS, PLEASE GIVE [place] A CALL AT [phone].   THE PATIENT IS ENCOURAGED TO PRACTICE SOCIAL DISTANCING DUE TO THE COVID-19 PANDEMIC.

## 2021-12-25 NOTE — Patient Instructions (Signed)
Cooking With Less Salt Cooking with less salt is one way to reduce the amount of sodium you get from food. Sodium is one of the elements that make up salt. It is found naturally in foods and is also added to certain foods. Depending on your condition and overall health, your health care provider or dietitian may recommend that you reduce your sodium intake. Most people should have less than 2,300 milligrams (mg) of sodium each day. If you have high blood pressure (hypertension), you may need to limit your sodium to 1,500 mg each day. Follow the tipsbelow to help reduce your sodium intake. What are tips for eating less sodium? Reading food labels  Check the food label before buying or using packaged ingredients. Always check the label for the serving size and sodium content. Look for products with no more than 140 mg of sodium in one serving. Check the % Daily Value column to see what percent of the daily recommended amount of sodium is provided in one serving of the product. Foods with 5% or less in this column are considered low in sodium. Foods with 20% or higher are considered high in sodium. Do not choose foods with salt as one of the first three ingredients on the ingredients list. If salt is one of the first three ingredients, it usually means the item is high in sodium.  Shopping Buy sodium-free or low-sodium products. Look for the following words on food labels: Low-sodium. Sodium-free. Reduced-sodium. No salt added. Unsalted. Always check the sodium content even if foods are labeled as low-sodium or no salt added. Buy fresh foods. Cooking Use herbs, seasonings without salt, and spices as substitutes for salt. Use sodium-free baking soda when baking. Grill, braise, or roast foods to add flavor with less salt. Avoid adding salt to pasta, rice, or hot cereals. Drain and rinse canned vegetables, beans, and meat before use. Avoid adding salt when cooking sweets and desserts. Cook with  low-sodium ingredients. What foods are high in sodium? Vegetables Regular canned vegetables (not low-sodium or reduced-sodium). Sauerkraut, pickled vegetables, and relishes. Olives. French fries. Onion rings. Regular canned tomato sauce and paste. Regular tomato and vegetable juice. Frozenvegetables in sauces. Grains Instant hot cereals. Bread stuffing, pancake, and biscuit mixes. Croutons. Seasoned rice or pasta mixes. Noodle soup cups. Boxed or frozen macaroni and cheese. Regular salted crackers. Self-rising flour. Rolls. Bagels. Flourtortillas and wraps. Meats and other proteins Meat or fish that is salted, canned, smoked, cured, spiced, or pickled. This includes bacon, ham, sausages, hot dogs, corned beef, chipped beef, meat loaves, salt pork, jerky, pickled herring, anchovies, regular canned tuna, andsardines. Salted nuts. Dairy Processed cheese and cheese spreads. Cheese curds. Blue cheese. Feta cheese.String cheese. Regular cottage cheese. Buttermilk. Canned milk. The items listed above may not be a complete list of foods high in sodium. Actual amounts of sodium may be different depending on processing. Contact a dietitian for more information. What foods are low in sodium? Fruits Fresh, frozen, or canned fruit with no sauce added. Fruit juice. Vegetables Fresh or frozen vegetables with no sauce added. "No salt added" canned vegetables. "No salt added" tomato sauce and paste. Low-sodium orreduced-sodium tomato and vegetable juice. Grains Noodles, pasta, quinoa, rice. Shredded or puffed wheat or puffed rice. Regular or quick oats (not instant). Low-sodium crackers. Low-sodium bread. Whole-grainbread and whole-grain pasta. Unsalted popcorn. Meats and other proteins Fresh or frozen whole meats, poultry (not injected with sodium), and fish with no sauce added. Unsalted nuts. Dried peas, beans, and   lentils without added salt. Unsalted canned beans. Eggs. Unsalted nut butters. Low-sodium canned  tunaor chicken. Dairy Milk. Soy milk. Yogurt. Low-sodium cheeses, such as Swiss, Monterey Jack, mozzarella, and ricotta. Sherbet or ice cream (keep to  cup per serving).Cream cheese. Fats and oils Unsalted butter or margarine. Other foods Homemade pudding. Sodium-free baking soda and baking powder. Herbs and spices.Low-sodium seasoning mixes. Beverages Coffee and tea. Carbonated beverages. The items listed above may not be a complete list of foods low in sodium. Actual amounts of sodium may be different depending on processing. Contact a dietitian for more information. What are some salt alternatives when cooking? The following are herbs, seasonings, and spices that can be used instead of salt to flavor your food. Herbs should be fresh or dried. Do not choose packaged mixes. Next to the name of the herb, spice, or seasoning aresome examples of foods you can pair it with. Herbs Bay leaves - Soups, meat and vegetable dishes, and spaghetti sauce. Basil - Italian dishes, soups, pasta, and fish dishes. Cilantro - Meat, poultry, and vegetable dishes. Chili powder - Marinades and Mexican dishes. Chives - Salad dressings and potato dishes. Cumin - Mexican dishes, couscous, and meat dishes. Dill - Fish dishes, sauces, and salads. Fennel - Meat and vegetable dishes, breads, and cookies. Garlic (do not use garlic salt) - Italian dishes, meat dishes, salad dressings, and sauces. Marjoram - Soups, potato dishes, and meat dishes. Oregano - Pizza and spaghetti sauce. Parsley - Salads, soups, pasta, and meat dishes. Rosemary - Italian dishes, salad dressings, soups, and red meats. Saffron - Fish dishes, pasta, and some poultry dishes. Sage - Stuffings and sauces. Tarragon - Fish and poultry dishes. Thyme - Stuffing, meat, and fish dishes. Seasonings Lemon juice - Fish dishes, poultry dishes, vegetables, and salads. Vinegar - Salad dressings, vegetables, and fish dishes. Spices Cinnamon - Sweet  dishes, such as cakes, cookies, and puddings. Cloves - Gingerbread, puddings, and marinades for meats. Curry - Vegetable dishes, fish and poultry dishes, and stir-fry dishes. Ginger - Vegetable dishes, fish dishes, and stir-fry dishes. Nutmeg - Pasta, vegetables, poultry, fish dishes, and custard. Summary Cooking with less salt is one way to reduce the amount of sodium that you get from food. Buy sodium-free or low-sodium products. Check the food label before using or buying packaged ingredients. Use herbs, seasonings without salt, and spices as substitutes for salt in foods. This information is not intended to replace advice given to you by your health care provider. Make sure you discuss any questions you have with your healthcare provider. Document Revised: 10/21/2019 Document Reviewed: 10/21/2019 Elsevier Patient Education  2022 [employer] Inc.  

## 2021-12-26 LAB — CMP14+EGFR
ALT: 39 IU/L — ABNORMAL HIGH (ref 0–32)
AST: 26 IU/L (ref 0–40)
Albumin/Globulin Ratio: 1.8 (ref 1.2–2.2)
Albumin: 4.9 g/dL (ref 3.8–4.9)
Alkaline Phosphatase: 89 IU/L (ref 44–121)
BUN/Creatinine Ratio: 10 (ref 9–23)
BUN: 9 mg/dL (ref 6–24)
Bilirubin Total: <0.2 mg/dL (ref 0.0–1.2)
CO2: 21 mmol/L (ref 20–29)
Calcium: 9.9 mg/dL (ref 8.7–10.2)
Chloride: 103 mmol/L (ref 96–106)
Creatinine, Ser: 0.94 mg/dL (ref 0.57–1.00)
Globulin, Total: 2.7 g/dL (ref 1.5–4.5)
Glucose: 76 mg/dL (ref 70–99)
Potassium: 4 mmol/L (ref 3.5–5.2)
Sodium: 144 mmol/L (ref 134–144)
Total Protein: 7.6 g/dL (ref 6.0–8.5)
eGFR: 73 mL/min/{1.73_m2} (ref >59)

## 2021-12-26 LAB — HEMOGLOBIN A1C
Est. average glucose Bld gHb Est-mCnc: 111 mg/dL
Hgb A1c MFr Bld: 5.5 % (ref 4.8–5.6)

## 2021-12-29 ENCOUNTER — Ambulatory Visit (INDEPENDENT_AMBULATORY_CARE_PROVIDER_SITE_OTHER): Payer: BC Managed Care – PPO | Admitting: Podiatry

## 2021-12-29 ENCOUNTER — Other Ambulatory Visit: Payer: Self-pay

## 2021-12-29 ENCOUNTER — Encounter: Payer: Self-pay | Admitting: Podiatry

## 2021-12-29 DIAGNOSIS — M7671 Peroneal tendinitis, right leg: Secondary | ICD-10-CM

## 2021-12-29 DIAGNOSIS — M7661 Achilles tendinitis, right leg: Secondary | ICD-10-CM

## 2021-12-29 MED ORDER — DICLOFENAC SODIUM 75 MG PO TBEC: 75.0000 mg | DELAYED_RELEASE_TABLET | Freq: Two times a day (BID) | ORAL | 2 refills | Status: DC

## 2021-12-30 NOTE — Progress Notes (Signed)
Subjective:   Patient ID: Desiree Dunn, female   DOB: 51 y.o.   MRN: [id number]   HPI Patient states she is still having pain and admits that she did not do a good job of wearing her boot or using medications   ROS      Objective:  Physical Exam  Neurovascular status intact with the patient's right posterior heel more around the Achilles tendon being irritated slightly within the peroneal lateral side right     Assessment:  Achilles tendinitis right that is still moderately tender improvement in the peroneal with mild discomfort     Plan:  It is still a short-term duration so I do not recommend current MRI even though it may be necessary if it does not settle down and I have recommended increasing her immobilization and utilizing ice therapy topical medications anti-inflammatories and will be seen back if symptoms persist

## 2022-01-23 ENCOUNTER — Encounter: Payer: Self-pay | Admitting: Internal Medicine

## 2022-01-26 ENCOUNTER — Encounter: Payer: Self-pay | Admitting: Allergy

## 2022-01-26 ENCOUNTER — Other Ambulatory Visit: Payer: Self-pay

## 2022-01-26 ENCOUNTER — Ambulatory Visit (INDEPENDENT_AMBULATORY_CARE_PROVIDER_SITE_OTHER): Payer: BC Managed Care – PPO | Admitting: Allergy

## 2022-01-26 VITALS — BP 122/84 | HR 98 | Temp 97.7°F | Resp 18 | Ht 64.0 in | Wt 232.2 lb

## 2022-01-26 DIAGNOSIS — J45990 Exercise induced bronchospasm: Secondary | ICD-10-CM | POA: Diagnosis not present

## 2022-01-26 DIAGNOSIS — H1013 Acute atopic conjunctivitis, bilateral: Secondary | ICD-10-CM

## 2022-01-26 DIAGNOSIS — J3089 Other allergic rhinitis: Secondary | ICD-10-CM | POA: Diagnosis not present

## 2022-01-26 MED ORDER — ALBUTEROL SULFATE HFA 108 (90 BASE) MCG/ACT IN AERS: INHALATION_SPRAY | RESPIRATORY_TRACT | 1 refills | Status: DC

## 2022-01-26 MED ORDER — AZELASTINE-FLUTICASONE 137-50 MCG/ACT NA SUSP: 1.0000 | Freq: Two times a day (BID) | NASAL | 5 refills | Status: DC

## 2022-01-26 MED ORDER — MONTELUKAST SODIUM 10 MG PO TABS: ORAL_TABLET | ORAL | 1 refills | Status: DC

## 2022-01-26 MED ORDER — EPINEPHRINE 0.3 MG/0.3ML IJ SOAJ: 0.3000 mg | INTRAMUSCULAR | 2 refills | Status: DC | PRN

## 2022-01-26 NOTE — Progress Notes (Signed)
? ? ?Follow-up Note ? ?RE: Desiree Dunn MRN: [id number] DOB: June 21, 1971 ?Date of Office Visit: 01/26/2022 ? ? ?History of present illness: ?Desiree Dunn is a 51 y.o. female presenting today for follow-up of allergic rhinitis with conjunctivitis and exercise-induced asthma.  She was last seen in the office on 07/14/21 by myself.  She does states the allergy medications are helpful but she does note congestion and raspy voice now with tree pollen at this time.  She is taking claritin in AM, xyzal in PM, Singulair in PM.  She is also using Dymista twice a day that she states is very helpful for nasal congestion and drainage control.  She does perform nasal saline rinses in the mornings.  She is not interested in getting back on immunotherapy but her schedule at this time is not very conducive.  She would be interested however in doing the rush protocol that would get her through the buildup a lot faster therefore needing less weekly visits for immunotherapy. ?She does use albuterol prior to activity.  She has not required use of albuterol for symptom relief. ? ?Review of systems: ?Review of Systems  ?Constitutional: Negative.   ?HENT:  Positive for congestion and voice change.   ?Eyes: Negative.   ?Respiratory: Negative.    ?Cardiovascular: Negative.   ?Gastrointestinal: Negative.   ?Musculoskeletal: Negative.   ?Skin: Negative.   ?Allergic/Immunologic: Negative.   ?Neurological: Negative.    ? ?All other systems negative unless noted above in HPI ? ?Past medical/social/surgical/family history have been reviewed and are unchanged unless specifically indicated below. ? ?No changes ? ?Medication List: ?Current Outpatient Medications  ?Medication Sig Dispense Refill  ? acetaminophen (TYLENOL) 500 MG tablet Take 1,000 mg by mouth as needed for mild pain or headache.    ? Chlorphen-PE-Acetaminophen (NOREL AD) 4-10-325 MG TABS Take one tablet every four hours as needed. 84 tablet 3  ? diclofenac (VOLTAREN)  75 MG EC tablet Take 1 tablet (75 mg total) by mouth 2 (two) times daily. 50 tablet 2  ? hydrochlorothiazide (MICROZIDE) 12.5 MG capsule TAKE 1 CAPSULE BY MOUTH EVERY DAY 90 capsule 3  ? ibuprofen (ADVIL,MOTRIN) 200 MG tablet Take 400 mg by mouth as needed for moderate pain.    ? olmesartan (BENICAR) 40 MG tablet TAKE 1 TABLET BY MOUTH EVERY DAY 90 tablet 1  ? oxymetazoline (AFRIN NASAL SPRAY) 0.05 % nasal spray Place 2 sprays into both nostrils 2 (two) times daily. Use no more than 3-5 days at a time. 30 mL 0  ? Semaglutide,0.25 or 0.'5MG'$ /DOS, (OZEMPIC, 0.25 OR 0.5 MG/DOSE,) 2 MG/1.5ML SOPN Inject 0.5 mg as directed once a week.    ? Semaglutide-Weight Management (WEGOVY) 0.5 MG/0.5ML SOAJ Inject 0.5 mg into the skin once a week. 2 mL 0  ? TURMERIC PO Take by mouth. AS NEEDED    ? verapamil (CALAN-SR) 120 MG CR tablet TAKE 1 TABLET BY MOUTH EVERY DAY 90 tablet 1  ? albuterol (VENTOLIN HFA) 108 (90 Base) MCG/ACT inhaler INHALE 2 PUFFS BY MOUTH EVERY 4 HOURS AS NEEDED 18 each 1  ? Azelastine-Fluticasone 137-50 MCG/ACT SUSP Place 1 spray into the nose 2 (two) times daily. 23 g 5  ? EPINEPHrine (EPIPEN 2-PAK) 0.3 mg/0.3 mL IJ SOAJ injection Inject 0.3 mg into the muscle as needed for anaphylaxis. 2 each 2  ? montelukast (SINGULAIR) 10 MG tablet TAKE 1 TABLET BY MOUTH EVERYDAY AT BEDTIME 90 tablet 1  ? ?No current facility-administered medications for this visit.  ?  ? ?  Known medication allergies: ?Allergies  ?Allergen Reactions  ? Amoxicillin Hives  ?  Hives  ? ? ? ?Physical examination: ?Blood pressure 122/84, pulse 98, temperature 97.7 ?F (36.5 ?C), resp. rate 18, height '5\' 4"'$  (1.626 m), weight 232 lb 4 oz (105.3 kg), SpO2 97 %. ? ?General: Alert, interactive, in no acute distress. ?HEENT: PERRLA, TMs pearly gray, turbinates moderately edematous without discharge, post-pharynx non erythematous. ?Neck: Supple without lymphadenopathy. ?Lungs: Clear to auscultation without wheezing, rhonchi or rales. {no increased work  of breathing. ?CV: Normal S1, S2 without murmurs. ?Abdomen: Nondistended, nontender. ?Skin: Warm and dry, without lesions or rashes. ?Extremities:  No clubbing, cyanosis or edema. ?Neuro:   Grossly intact. ? ?Diagnositics/Labs: ?None today ? ?Assessment and plan: ?  ?Allergic rhinitis with conjunctivitis ? - continue avoidance measures for tree pollens, weed, pollens, grass pollens, molds, dust mite, dog and cockroach ? - continue Xyzal '5mg'$  daily as needed (if needed may take twice a day) ? - continue Singulair '10mg'$  daily at bedtime.   ? - if you have severe nasal congestion where you cannot breathe through either side of the nose is Afrin 2 sprays each nostril twice a day no more than the next 3-5 days to help decongest your nose.  Wait 5 to 15 minutes or until you can breathe more freely through your nose then follow-up with your Dymista ? - continue Dymista 1 spray each nostril twice a day as needed for nasal congestion/drainage ? - continue use of nasal saline rinse to help flush and clean the nose.  Use prior to medicated nasal spray use.  Use only distilled water or boil water and bring to room/lukewarm temperature prior to use.  ? - for itchy/watery/red eyes use Pazeo 1 drop each eye daily as needed.   ? - use your dry eye drop or rewetting drop to help keep eyes moisturized ? - recommend she resume immunotherapy and to get better control of her allergy symptoms and have less need for medication management.  I did review with her the rush protocol that we will be implementing in her [place] office soon and she is interested in this option.  Will have her added to the list.   ? ?Exercise induced asthma ? - have access to albuterol inhaler 2 puffs every 4-6 hours as needed for cough/wheeze/shortness of breath/chest tightness.  May use 15-20 minutes prior to activity.   Monitor frequency of use.   ? ? ?Follow-up 6 months or sooner if needed ? ?I appreciate the opportunity to take part in Desiree Dunn's care. Please  do not hesitate to contact me with questions. ? ?Sincerely, ? ? ?Prudy Feeler, MD ?Allergy/Immunology ?[facility] ? ? ?

## 2022-01-26 NOTE — Patient Instructions (Addendum)
Allergies ? - continue avoidance measures for tree pollens, weed, pollens, grass pollens, molds, dust mite, dog and cockroach ? - continue Xyzal '5mg'$  daily as needed (if needed may take twice a day) ? - continue Singulair '10mg'$  daily at bedtime.   ? - if you have severe nasal congestion where you cannot breathe through either side of the nose is Afrin 2 sprays each nostril twice a day no more than the next 3-5 days to help decongest your nose.  Wait 5 to 15 minutes or until you can breathe more freely through your nose then follow-up with your Dymista ? - continue Dymista 1 spray each nostril twice a day as needed for nasal congestion/drainage ? - continue use of nasal saline rinse to help flush and clean the nose.  Use prior to medicated nasal spray use.  Use only distilled water or boil water and bring to room/lukewarm temperature prior to use.  ? - for itchy/watery/red eyes use Pazeo 1 drop each eye daily as needed.   ? - use your dry eye drop or rewetting drop to help keep eyes moisturized ? - recommend she resume immunotherapy and to get better control of her allergy symptoms and have less need for medication management.  I did review with her the rush protocol that we will be implementing in her [place] office soon and she is interested in this option.  Will have her added to the list.   ? ?Exercise induced asthma ? - have access to albuterol inhaler 2 puffs every 4-6 hours as needed for cough/wheeze/shortness of breath/chest tightness.  May use 15-20 minutes prior to activity.   Monitor frequency of use.   ? ? ?Follow-up 6 months or sooner if needed ? ? ?

## 2022-01-29 ENCOUNTER — Telehealth: Payer: Self-pay | Admitting: *Deleted

## 2022-01-29 NOTE — Telephone Encounter (Signed)
PA has been submitted through CoverMyMeds for Azelastine-Fluticasone and is currently pending approval/denial.  ?

## 2022-01-30 NOTE — Telephone Encounter (Signed)
PA still pending.  

## 2022-01-31 NOTE — Telephone Encounter (Signed)
PA has been approved through CoverMyMeds and has been sent electronically to the pharmacy. PA is good for one year.  ?

## 2022-02-15 ENCOUNTER — Encounter: Payer: Self-pay | Admitting: Internal Medicine

## 2022-02-28 ENCOUNTER — Ambulatory Visit (INDEPENDENT_AMBULATORY_CARE_PROVIDER_SITE_OTHER): Payer: BC Managed Care – PPO | Admitting: Podiatry

## 2022-02-28 DIAGNOSIS — T148XXA Other injury of unspecified body region, initial encounter: Secondary | ICD-10-CM

## 2022-03-01 NOTE — Progress Notes (Signed)
Subjective:  ? ?Patient ID: Desiree Dunn, female   DOB: 51 y.o.   MRN: [id number]  ? ?HPI ?Patient presents stating she still is getting a lot of pain in the outside of her right ankle and around her Achilles tendon.  States that its not resolved with immobilization and ice therapy and if she does not wear the boot it is severely tender and still moderately tender in the boot also.  Patient has good overall health ? ? ?ROS ? ? ?   ?Objective:  ?Physical Exam  ?Exquisite discomfort around the peroneal tendon as it comes underneath the lateral malleolus right and mild to moderate discomfort of the Achilles tendon lateral side with no indication of tendon dysfunction currently ? ?   ?Assessment:  ?Possibility for interstitial tear of the peroneal tendon right with also possibility for Achilles tendon tendinitis or other pathology of the Achilles tendon ? ?   ?Plan:  ?H&P reviewed condition and I have recommended at this point that we get an MRI to rule out interstitial tear or other pathology of the Achilles and peroneal tendon.  After results are achieved we will decide on best long-term plan to try to help her and it is scheduled today ?   ? ? ?

## 2022-03-10 ENCOUNTER — Ambulatory Visit
Admission: RE | Admit: 2022-03-10 | Discharge: 2022-03-10 | Disposition: A | Discharge status: Home / Self Care | Payer: Self-pay | Source: Ambulatory Visit | Attending: Podiatry | Admitting: Podiatry

## 2022-03-10 DIAGNOSIS — S86011A Strain of right Achilles tendon, initial encounter: Secondary | ICD-10-CM | POA: Diagnosis not present

## 2022-03-10 DIAGNOSIS — T148XXA Other injury of unspecified body region, initial encounter: Secondary | ICD-10-CM

## 2022-03-14 ENCOUNTER — Other Ambulatory Visit: Payer: BC Managed Care – PPO

## 2022-03-15 ENCOUNTER — Encounter: Payer: Self-pay | Admitting: Podiatry

## 2022-03-15 ENCOUNTER — Ambulatory Visit (INDEPENDENT_AMBULATORY_CARE_PROVIDER_SITE_OTHER): Payer: BC Managed Care – PPO | Admitting: Podiatry

## 2022-03-15 DIAGNOSIS — M7671 Peroneal tendinitis, right leg: Secondary | ICD-10-CM | POA: Diagnosis not present

## 2022-03-15 DIAGNOSIS — M7661 Achilles tendinitis, right leg: Secondary | ICD-10-CM

## 2022-03-15 DIAGNOSIS — T148XXA Other injury of unspecified body region, initial encounter: Secondary | ICD-10-CM | POA: Diagnosis not present

## 2022-03-15 NOTE — Progress Notes (Signed)
Subjective:  ? ?Patient ID: Desiree Dunn, female   DOB: 51 y.o.   MRN: [id number]  ? ?HPI ?Patient is continuing to experience discomfort in the outside of the right posterior heel and states that the boot is helpful but she cannot wear the boot all the time and she is limping ? ? ?ROS ? ? ?   ?Objective:  ?Physical Exam  ?Neurovascular status intact with discomfort in the posterior lateral aspect of the right Achilles tendon insertion and also into the peroneal tendon on the lateral side with most the pain still around the Achilles with MRI results that are going to be discussed today ? ?   ?Assessment:  ?Inflammatory condition with minimal tearing of the Achilles tendon and inflammatory condition peroneal tendon right ? ?   ?Plan:  ?H&P reviewed MRI with patient and at this point does not appear that surgery is the best option for her but given the extent of discomfort failure to respond to previous immobilization injections anti-inflammatories and physical therapy PRP has been recommended and I have referred to Dr. Posey Pronto to have this done of the Achilles and possible peroneal tendon.  Also I would recommend 2 weeks of the most part complete nonweightbearing followed by aggressive physical therapy which had been done prior.  Patient will see Dr. Posey Pronto for PRP injection right posterior heel based on results of MRI ?   ? ? ?

## 2022-03-17 ENCOUNTER — Other Ambulatory Visit: Payer: Self-pay

## 2022-03-17 ENCOUNTER — Ambulatory Visit (HOSPITAL_COMMUNITY)
Admission: RE | Admit: 2022-03-17 | Discharge: 2022-03-17 | Disposition: A | Discharge status: Home / Self Care | Payer: BC Managed Care – PPO | Source: Ambulatory Visit | Attending: Nurse Practitioner | Admitting: Nurse Practitioner

## 2022-03-17 ENCOUNTER — Encounter (HOSPITAL_COMMUNITY): Payer: Self-pay

## 2022-03-17 VITALS — BP 128/74 | HR 85 | Temp 99.3°F | Resp 20

## 2022-03-17 DIAGNOSIS — J029 Acute pharyngitis, unspecified: Secondary | ICD-10-CM

## 2022-03-17 LAB — POCT RAPID STREP A, ED / UC: Streptococcus, Group A Screen (Direct): NEGATIVE

## 2022-03-17 MED ORDER — CEPHALEXIN 500 MG PO CAPS: 500.0000 mg | ORAL_CAPSULE | Freq: Two times a day (BID) | ORAL | 0 refills | Status: AC

## 2022-03-17 NOTE — Discharge Instructions (Addendum)
-   Rapid strep throat test today is negative ?-Please start the Keflex 500 mg twice daily for 10 days.  If this does not help after couple of days, please schedule follow-up with your allergist to discuss your symptoms ?-Change toothbrush a couple days after treatment if the Keflex is helping ?-Take the fluconazole at the end of treatment if you are having symptoms of yeast infection ?

## 2022-03-17 NOTE — ED Triage Notes (Signed)
Complains of sore throat.  Sore throat started last Saturday.  Patient has had a cough, but no fever.  However, a student has had a positive strep.   ?

## 2022-03-17 NOTE — ED Provider Notes (Signed)
?[facility] ? ? ? ?CSN: [id number] ?Arrival date & time: 03/17/22  1547 ? ? ?  ? ?History   ?Chief Complaint ?Chief Complaint  ?Patient presents with  ? Sore Throat  ?  I have a sore throat that I have tried to treat myself.  I used warm salt water but nothing seems to help. - Entered by patient  ? ? ?HPI ?Desiree Dunn is a 51 y.o. female.  ? ?Patient presents with more than 1 week of sore throat.  She denies body aches or fevers or chills.  She does have also have a dry cough.  She denies shortness of breath, chest pain or tightness, wheezing.  She denies any congestion or test.  She does feel some postnasal drainage at times.  However denies nausea, vomiting, abdominal pain, and diarrhea.  She is eating and drinking normally.  No fatigue.  No rash.  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  She is concerned that she may have strep throat since her symptoms have been ongoing more than a week and she has been using salt water gargles and taking her allergy regimen as prescribed by an allergist without any relief of symptoms. ? ? ?Past Medical History:  ?Diagnosis Date  ? Allergy   ? Anxiety   ? Back pain   ? Chronic RLQ pain   ? since 1995 when she had ectopic preg  ? Constipation   ? Deviated septum   ? Diverticulitis   ? Ectopic pregnancy 1995  ? x 1 , R side   ? Eczema   ? GERD (gastroesophageal reflux disease)   ? HTN (hypertension) 05/24/2015  ? Migraine without aura and without status migrainosus, not intractable   ? Miscarriage   ? x 1  ? Obesity, unspecified 07/06/2010  ? OSA (obstructive sleep apnea)   ? Pelvic congestion   ? h/o   ? Sleep apnea   ? uses cpap  ? ? ?Patient Active Problem List  ? Diagnosis Date Noted  ? Postnasal drip 05/20/2019  ? Sorethroat 05/20/2019  ? Rash 11/21/2018  ? Non-seasonal allergic rhinitis 11/21/2018  ? Hypertrophy, nasal, turbinate 10/08/2018  ? Moderate obstructive sleep apnea-hypopnea syndrome 10/08/2018  ?  Deviated septum 10/08/2018  ? OSA on CPAP 10/08/2018  ? Insulin resistance 09/15/2018  ? Obstructive sleep apnea syndrome 09/01/2018  ? PCP NOTES >>> 08/25/2015  ? HTN (hypertension) 05/24/2015  ? Episodic tension-type headache, not intractable 02/11/2015  ? Migraine without aura and without status migrainosus, not intractable 06/02/2014  ? GERD (gastroesophageal reflux disease) 12/01/2012  ? CTS (carpal tunnel syndrome) 12/01/2012  ? Dermatitis 12/01/2012  ? Annual physical exam 03/19/2012  ? HEADACHE 12/25/2010  ? Class 3 severe obesity due to excess calories with serious comorbidity and body mass index (BMI) of 40.0 to 44.9 in adult [facility]) 07/06/2010  ? Abdominal pain, chronic, right lower quadrant 06/09/2009  ? Nausea alone 04/27/2009  ? ? ?Past Surgical History:  ?Procedure Laterality Date  ? ABDOMINAL HYSTERECTOMY  12/2009  ? [facility]  ? BREAST REDUCTION SURGERY  10/2008  ? COLONOSCOPY  2001  ? X3 ; diverticulosis  ? FOOT SURGERY    ? NASAL SEPTOPLASTY W/ TURBINOPLASTY Bilateral 10/31/2018  ? Procedure: NASAL SEPTOPLASTY WITH TURBINATE REDUCTION;  Surgeon: Jerrell Belfast, MD;  Location: [facility];  Service: ENT;  Laterality: Bilateral;  ? PARTIAL HYSTERECTOMY    ? POLYPECTOMY    ? [facility]   ?  REDUCTION MAMMAPLASTY Bilateral 10/2008  ? RIGHT OOPHORECTOMY  12/2009  ? [facility]  ? TRIGGER FINGER RELEASE Right 04/28/2021  ? middle finger  ? TUBAL LIGATION  2000  ? ? ?OB History   ? ? Gravida  ?3  ? Para  ?2  ? Term  ?   ? Preterm  ?   ? AB  ?   ? Living  ?   ?  ? ? SAB  ?   ? IAB  ?   ? Ectopic  ?   ? Multiple  ?   ? Live Births  ?   ?   ?  ?  ? ? ? ?Home Medications   ? ?Prior to Admission medications   ?Medication Sig Start Date End Date Taking? Authorizing Provider  ?cephALEXin (KEFLEX) 500 MG capsule Take 1 capsule (500 mg total) by mouth 2 (two) times daily for 10 days. 03/17/22 03/27/22 Yes Eulogio Bear, NP  ?acetaminophen (TYLENOL) 500 MG tablet Take 1,000 mg by mouth as needed for mild pain or headache.     [provider]  ?albuterol (VENTOLIN HFA) 108 (90 Base) MCG/ACT inhaler INHALE 2 PUFFS BY MOUTH EVERY 4 HOURS AS NEEDED 01/26/22   Kennith Gain, MD  ?Azelastine-Fluticasone 137-50 MCG/ACT SUSP Place 1 spray into the nose 2 (two) times daily. 01/26/22   Kennith Gain, MD  ?Chlorphen-PE-Acetaminophen (NOREL AD) 4-10-325 MG TABS Take one tablet every four hours as needed. 10/26/21   Glendale Chard, MD  ?diclofenac (VOLTAREN) 75 MG EC tablet Take 1 tablet (75 mg total) by mouth 2 (two) times daily. 12/29/21   Wallene Huh, DPM  ?EPINEPHrine (EPIPEN 2-PAK) 0.3 mg/0.3 mL IJ SOAJ injection Inject 0.3 mg into the muscle as needed for anaphylaxis. 01/26/22   Kennith Gain, MD  ?hydrochlorothiazide (MICROZIDE) 12.5 MG capsule TAKE 1 CAPSULE BY MOUTH EVERY DAY 12/07/21   Glendale Chard, MD  ?ibuprofen (ADVIL,MOTRIN) 200 MG tablet Take 400 mg by mouth as needed for moderate pain.    [provider]  ?montelukast (SINGULAIR) 10 MG tablet TAKE 1 TABLET BY MOUTH EVERYDAY AT BEDTIME 01/26/22   Kennith Gain, MD  ?olmesartan (BENICAR) 40 MG tablet TAKE 1 TABLET BY MOUTH EVERY DAY 12/04/21   Glendale Chard, MD  ?oxymetazoline (AFRIN NASAL SPRAY) 0.05 % nasal spray Place 2 sprays into both nostrils 2 (two) times daily. Use no more than 3-5 days at a time. 07/14/21   Kennith Gain, MD  ?Semaglutide,0.25 or 0.'5MG'$ /DOS, (OZEMPIC, 0.25 OR 0.5 MG/DOSE,) 2 MG/1.5ML SOPN Inject 0.5 mg as directed once a week.    [provider]  ?Semaglutide-Weight Management (WEGOVY) 0.5 MG/0.5ML SOAJ Inject 0.5 mg into the skin once a week. 12/25/21   Glendale Chard, MD  ?TURMERIC PO Take by mouth. AS NEEDED    [provider]  ?verapamil (CALAN-SR) 120 MG CR tablet TAKE 1 TABLET BY MOUTH EVERY DAY 09/08/21   Glendale Chard, MD  ? ? ?Family History ?Family History  ?Problem Relation Age of Onset  ? Colon cancer Mother 29  ? Colon cancer Maternal Aunt 63  ? Stroke  Father   ? Diabetes Father   ? Hypertension Father   ? Hyperlipidemia Father   ? Heart disease Father   ? Obesity Father   ? Hypertension Sister   ? Colon polyps Brother   ? Colitis Brother   ? Depression Son   ? Asperger's syndrome Son   ? Breast cancer Paternal Grandmother   ?  Prostate cancer Brother   ? Heart attack Neg Hx   ? Esophageal cancer Neg Hx   ? Rectal cancer Neg Hx   ? Stomach cancer Neg Hx   ? ? ?Social History ?Social History  ? ?Tobacco Use  ? Smoking status: Never  ? Smokeless tobacco: Never  ?Vaping Use  ? Vaping Use: Never used  ?Substance Use Topics  ? Alcohol use: Yes  ?  Alcohol/week: 0.0 standard drinks  ?  Comment: socially - 1 drink every 3 months  ? Drug use: No  ? ? ? ?Allergies   ?Amoxicillin ? ? ?Review of Systems ?Review of Systems ?Per HPI ? ?Physical Exam ?Triage Vital Signs ?ED Triage Vitals  ?Enc Vitals Group  ?   BP 03/17/22 1652 128/74  ?   Pulse Rate 03/17/22 1652 85  ?   Resp 03/17/22 1652 20  ?   Temp 03/17/22 1652 99.3 ?F (37.4 ?C)  ?   Temp Source 03/17/22 1652 Oral  ?   SpO2 03/17/22 1652 97 %  ?   Weight --   ?   Height --   ?   Head Circumference --   ?   Peak Flow --   ?   Pain Score 03/17/22 1649 8  ?   Pain Loc --   ?   Pain Edu? --   ?   Excl. in [place]? --   ? ?No data found. ? ?Updated Vital Signs ?BP 128/74 (BP Location: Right Arm) Comment (BP Location): large cuff  Pulse 85   Temp 99.3 ?F (37.4 ?C) (Oral)   Resp 20   SpO2 97%  ? ?Visual Acuity ?Right Eye Distance:   ?Left Eye Distance:   ?Bilateral Distance:   ? ?Right Eye Near:   ?Left Eye Near:    ?Bilateral Near:    ? ?Physical Exam ?Vitals and nursing note reviewed.  ?Constitutional:   ?   General: She is not in acute distress. ?   Appearance: She is well-developed. She is obese. She is not toxic-appearing.  ?HENT:  ?   Head: Normocephalic and atraumatic.  ?   Right Ear: Tympanic membrane and ear canal normal. No drainage, swelling or tenderness. No middle ear effusion. Tympanic membrane is not erythematous.   ?   Left Ear: Tympanic membrane and ear canal normal. No drainage, swelling or tenderness.  No middle ear effusion. Tympanic membrane is not erythematous.  ?   Nose: No congestion or rhinorrhea.  ?   Mouth/Thro

## 2022-03-21 ENCOUNTER — Encounter: Payer: Self-pay | Admitting: Internal Medicine

## 2022-03-21 ENCOUNTER — Ambulatory Visit (INDEPENDENT_AMBULATORY_CARE_PROVIDER_SITE_OTHER): Payer: BC Managed Care – PPO | Admitting: Internal Medicine

## 2022-03-21 VITALS — BP 122/80 | HR 97 | Temp 98.9°F | Ht 64.0 in | Wt 236.4 lb

## 2022-03-21 DIAGNOSIS — Z23 Encounter for immunization: Secondary | ICD-10-CM | POA: Diagnosis not present

## 2022-03-21 DIAGNOSIS — E8881 Metabolic syndrome: Secondary | ICD-10-CM | POA: Diagnosis not present

## 2022-03-21 DIAGNOSIS — Z6841 Body Mass Index (BMI) 40.0 and over, adult: Secondary | ICD-10-CM | POA: Diagnosis not present

## 2022-03-21 MED ORDER — RYBELSUS 7 MG PO TABS: 7.0000 mg | ORAL_TABLET | Freq: Every day | ORAL | 1 refills | Status: DC

## 2022-03-21 NOTE — Progress Notes (Signed)
?[name],acting as a Education administrator for [name], MD.,have documented all relevant documentation on the behalf of [name], MD,as directed by  [name], MD while in the presence of [name], MD.  ?This visit occurred during the SARS-CoV-2 public health emergency.  Safety protocols were in place, including screening questions prior to the visit, additional usage of staff PPE, and extensive cleaning of exam room while observing appropriate contact time as indicated for disinfecting solutions. ? ?Subjective:  ?  ? Patient ID: Desiree Dunn , female    DOB: 09-12-1971 , 51 y.o.   MRN: [id number] ? ? ?Chief Complaint  ?Patient presents with  ? Weight Check  ? ? ?HPI ? ?Patient presents today for a weight check. Patient stated she has been unable to take her Ozempic for 3 weeks now because the pen wouldn't work. Apparently, staff advised her to come in so we could look at pen, but she dd not come in.  ?  ? ?Past Medical History:  ?Diagnosis Date  ? Allergy   ? Anxiety   ? Back pain   ? Chronic RLQ pain   ? since 1995 when she had ectopic preg  ? Constipation   ? Deviated septum   ? Diverticulitis   ? Ectopic pregnancy 1995  ? x 1 , R side   ? Eczema   ? GERD (gastroesophageal reflux disease)   ? HTN (hypertension) 05/24/2015  ? Migraine without aura and without status migrainosus, not intractable   ? Miscarriage   ? x 1  ? Obesity, unspecified 07/06/2010  ? OSA (obstructive sleep apnea)   ? Pelvic congestion   ? h/o   ? Sleep apnea   ? uses cpap  ?  ? ?Family History  ?Problem Relation Age of Onset  ? Colon cancer Mother 61  ? Colon cancer Maternal Aunt 67  ? Stroke Father   ? Diabetes Father   ? Hypertension Father   ? Hyperlipidemia Father   ? Heart disease Father   ? Obesity Father   ? Hypertension Sister   ? Colon polyps Brother   ? Colitis Brother   ? Depression Son   ? Asperger's syndrome Son   ? Breast cancer Paternal Grandmother   ? Prostate cancer Brother   ? Heart attack  Neg Hx   ? Esophageal cancer Neg Hx   ? Rectal cancer Neg Hx   ? Stomach cancer Neg Hx   ? ? ? ?Current Outpatient Medications:  ?  acetaminophen (TYLENOL) 500 MG tablet, Take 1,000 mg by mouth as needed for mild pain or headache., Disp: , Rfl:  ?  albuterol (VENTOLIN HFA) 108 (90 Base) MCG/ACT inhaler, INHALE 2 PUFFS BY MOUTH EVERY 4 HOURS AS NEEDED, Disp: 18 each, Rfl: 1 ?  Azelastine-Fluticasone 137-50 MCG/ACT SUSP, Place 1 spray into the nose 2 (two) times daily., Disp: 23 g, Rfl: 5 ?  cephALEXin (KEFLEX) 500 MG capsule, Take 1 capsule (500 mg total) by mouth 2 (two) times daily for 10 days., Disp: 20 capsule, Rfl: 0 ?  Chlorphen-PE-Acetaminophen (NOREL AD) 4-10-325 MG TABS, Take one tablet every four hours as needed., Disp: 84 tablet, Rfl: 3 ?  EPINEPHrine (EPIPEN 2-PAK) 0.3 mg/0.3 mL IJ SOAJ injection, Inject 0.3 mg into the muscle as needed for anaphylaxis., Disp: 2 each, Rfl: 2 ?  hydrochlorothiazide (MICROZIDE) 12.5 MG capsule, TAKE 1 CAPSULE BY MOUTH EVERY DAY, Disp: 90 capsule, Rfl: 3 ?  ibuprofen (ADVIL,MOTRIN) 200 MG  tablet, Take 400 mg by mouth as needed for moderate pain., Disp: , Rfl:  ?  montelukast (SINGULAIR) 10 MG tablet, TAKE 1 TABLET BY MOUTH EVERYDAY AT BEDTIME, Disp: 90 tablet, Rfl: 1 ?  olmesartan (BENICAR) 40 MG tablet, TAKE 1 TABLET BY MOUTH EVERY DAY, Disp: 90 tablet, Rfl: 1 ?  oxymetazoline (AFRIN NASAL SPRAY) 0.05 % nasal spray, Place 2 sprays into both nostrils 2 (two) times daily. Use no more than 3-5 days at a time., Disp: 30 mL, Rfl: 0 ?  Semaglutide (RYBELSUS) 7 MG TABS, Take 7 mg by mouth daily., Disp: 30 tablet, Rfl: 1 ?  TURMERIC PO, Take by mouth. AS NEEDED, Disp: , Rfl:  ?  verapamil (CALAN-SR) 120 MG CR tablet, TAKE 1 TABLET BY MOUTH EVERY DAY, Disp: 90 tablet, Rfl: 1  ? ?Allergies  ?Allergen Reactions  ? Amoxicillin Hives  ?  Hives  ?  ? ?Review of Systems  ?Constitutional: Negative.   ?Respiratory: Negative.    ?Cardiovascular: Negative.   ?Gastrointestinal: Negative.    ?Neurological: Negative.   ?Psychiatric/Behavioral: Negative.     ? ?Today's Vitals  ? 03/21/22 1557  ?BP: 122/80  ?Pulse: 97  ?Temp: 98.9 ?F (37.2 ?C)  ?Weight: 236 lb 6.4 oz (107.2 kg)  ?Height: '5\' 4"'$  (1.626 m)  ?PainSc: 0-No pain  ? ?Body mass index is 40.58 kg/m?.  ?Wt Readings from Last 3 Encounters:  ?03/21/22 236 lb 6.4 oz (107.2 kg)  ?01/26/22 232 lb 4 oz (105.3 kg)  ?12/25/21 230 lb (104.3 kg)  ?  ? ?Objective:  ?Physical Exam ?Vitals and nursing note reviewed.  ?Constitutional:   ?   Appearance: Normal appearance.  ?HENT:  ?   Head: Normocephalic and atraumatic.  ?Eyes:  ?   Extraocular Movements: Extraocular movements intact.  ?Cardiovascular:  ?   Rate and Rhythm: Normal rate and regular rhythm.  ?   Heart sounds: Normal heart sounds.  ?Pulmonary:  ?   Effort: Pulmonary effort is normal.  ?   Breath sounds: Normal breath sounds.  ?Musculoskeletal:  ?   Cervical back: Normal range of motion.  ?Skin: ?   General: Skin is warm.  ?Neurological:  ?   General: No focal deficit present.  ?   Mental Status: She is alert.  ?Psychiatric:     ?   Mood and Affect: Mood normal.     ?   Behavior: Behavior normal.  ?   ?Assessment And Plan:  ?   ?1. Class 3 severe obesity due to excess calories with serious comorbidity and body mass index (BMI) of 40.0 to 44.9 in adult [facility]) ?Comments: She has gained 3 lbs since March 2023. She is encouraged to consider water exercises due to foot pain. She will c/w semaglutide, but switch to oral version.  ? ?2. Insulin resistance ?Comments: Will switch to oral semaglutide since her insurance does not cover injectable version. Will send Rybelsus '7mg'$  to pharmacy to start PA. F/u 8 wks.  ? ?3. Immunization due ?- Varicella-zoster vaccine IM (Shingrix) ?  ?Patient was given opportunity to ask questions. Patient verbalized understanding of the plan and was able to repeat key elements of the plan. All questions were answered to their satisfaction.  ? ?I, [name], MD, have reviewed  all documentation for this visit. The documentation on 03/21/22 for the exam, diagnosis, procedures, and orders are all accurate and complete.  ? ?IF YOU HAVE BEEN REFERRED TO A SPECIALIST, IT MAY TAKE 1-2 WEEKS TO SCHEDULE/PROCESS  THE REFERRAL. IF YOU HAVE NOT HEARD FROM US/SPECIALIST IN TWO WEEKS, PLEASE GIVE [place] A CALL AT [phone].  ? ?THE PATIENT IS ENCOURAGED TO PRACTICE SOCIAL DISTANCING DUE TO THE COVID-19 PANDEMIC.   ?

## 2022-03-21 NOTE — Patient Instructions (Signed)

## 2022-03-27 ENCOUNTER — Encounter: Payer: Self-pay | Admitting: Internal Medicine

## 2022-04-04 ENCOUNTER — Ambulatory Visit (INDEPENDENT_AMBULATORY_CARE_PROVIDER_SITE_OTHER): Payer: BC Managed Care – PPO | Admitting: Podiatry

## 2022-04-04 DIAGNOSIS — M7661 Achilles tendinitis, right leg: Secondary | ICD-10-CM

## 2022-04-04 DIAGNOSIS — M7671 Peroneal tendinitis, right leg: Secondary | ICD-10-CM

## 2022-04-04 NOTE — Progress Notes (Signed)
PRP injection was given to the right Achilles tendon and right peroneal tendon at the point of maximal tenderness in standard technique.  7.5 cc of PRP was injected to the right side.  No complication noted

## 2022-04-06 ENCOUNTER — Encounter: Payer: Self-pay | Admitting: Podiatry

## 2022-04-10 ENCOUNTER — Telehealth: Payer: Self-pay | Admitting: *Deleted

## 2022-04-10 NOTE — Telephone Encounter (Signed)
Patient is calling to ask if she is clear to start PT. She has been experiencing pain in calf and at the injection site, what to do to ease the pain.Please advise.

## 2022-04-10 NOTE — Telephone Encounter (Signed)
Patient has been notified of recommendations and will call back if she needs another referral for PT

## 2022-04-17 ENCOUNTER — Other Ambulatory Visit: Payer: Self-pay | Admitting: Internal Medicine

## 2022-04-17 DIAGNOSIS — Z1231 Encounter for screening mammogram for malignant neoplasm of breast: Secondary | ICD-10-CM

## 2022-04-30 ENCOUNTER — Other Ambulatory Visit: Payer: Self-pay | Admitting: Internal Medicine

## 2022-04-30 DIAGNOSIS — Z01419 Encounter for gynecological examination (general) (routine) without abnormal findings: Secondary | ICD-10-CM | POA: Diagnosis not present

## 2022-04-30 DIAGNOSIS — Z6838 Body mass index (BMI) 38.0-38.9, adult: Secondary | ICD-10-CM | POA: Diagnosis not present

## 2022-05-02 ENCOUNTER — Ambulatory Visit (INDEPENDENT_AMBULATORY_CARE_PROVIDER_SITE_OTHER): Payer: BC Managed Care – PPO | Admitting: Podiatry

## 2022-05-02 DIAGNOSIS — M7661 Achilles tendinitis, right leg: Secondary | ICD-10-CM | POA: Diagnosis not present

## 2022-05-02 DIAGNOSIS — M7671 Peroneal tendinitis, right leg: Secondary | ICD-10-CM | POA: Diagnosis not present

## 2022-05-02 NOTE — Progress Notes (Signed)
Subjective:  Patient ID: Desiree Dunn, female    DOB: 17-Mar-1971,  MRN: [id number]  Chief Complaint  Patient presents with   PRP    Pt stated that she feels like the PRP was helpful     51 y.o. female presents with the above complaint.  Patient presents with follow-up of right Achilles tendinitis and right peroneal tendinitis.  She states the injection helped considerably.  She does not have any pain.  She states that she has been ambulating in regular shoes.  She denies any other acute complaints.   Review of Systems: Negative except as noted in the HPI. Denies N/V/F/Ch.  Past Medical History:  Diagnosis Date   Allergy    Anxiety    Back pain    Chronic RLQ pain    since 1995 when she had ectopic preg   Constipation    Deviated septum    Diverticulitis    Ectopic pregnancy 1995   x 1 , R side    Eczema    GERD (gastroesophageal reflux disease)    HTN (hypertension) 05/24/2015   Migraine without aura and without status migrainosus, not intractable    Miscarriage    x 1   Obesity, unspecified 07/06/2010   OSA (obstructive sleep apnea)    Pelvic congestion    h/o    Sleep apnea    uses cpap    Current Outpatient Medications:    acetaminophen (TYLENOL) 500 MG tablet, Take 1,000 mg by mouth as needed for mild pain or headache., Disp: , Rfl:    albuterol (VENTOLIN HFA) 108 (90 Base) MCG/ACT inhaler, INHALE 2 PUFFS BY MOUTH EVERY 4 HOURS AS NEEDED, Disp: 18 each, Rfl: 1   Azelastine-Fluticasone 137-50 MCG/ACT SUSP, Place 1 spray into the nose 2 (two) times daily., Disp: 23 g, Rfl: 5   Chlorphen-PE-Acetaminophen (NOREL AD) 4-10-325 MG TABS, Take one tablet every four hours as needed., Disp: 84 tablet, Rfl: 3   EPINEPHrine (EPIPEN 2-PAK) 0.3 mg/0.3 mL IJ SOAJ injection, Inject 0.3 mg into the muscle as needed for anaphylaxis., Disp: 2 each, Rfl: 2   hydrochlorothiazide (MICROZIDE) 12.5 MG capsule, TAKE 1 CAPSULE BY MOUTH EVERY DAY, Disp: 90 capsule, Rfl: 3    ibuprofen (ADVIL,MOTRIN) 200 MG tablet, Take 400 mg by mouth as needed for moderate pain., Disp: , Rfl:    montelukast (SINGULAIR) 10 MG tablet, TAKE 1 TABLET BY MOUTH EVERYDAY AT BEDTIME, Disp: 90 tablet, Rfl: 1   olmesartan (BENICAR) 40 MG tablet, TAKE 1 TABLET BY MOUTH EVERY DAY, Disp: 90 tablet, Rfl: 1   oxymetazoline (AFRIN NASAL SPRAY) 0.05 % nasal spray, Place 2 sprays into both nostrils 2 (two) times daily. Use no more than 3-5 days at a time., Disp: 30 mL, Rfl: 0   TURMERIC PO, Take by mouth. AS NEEDED, Disp: , Rfl:    verapamil (CALAN-SR) 120 MG CR tablet, TAKE 1 TABLET BY MOUTH EVERY DAY, Disp: 90 tablet, Rfl: 1  Social History   Tobacco Use  Smoking Status Never  Smokeless Tobacco Never    Allergies  Allergen Reactions   Amoxicillin Hives    Hives   Objective:  There were no vitals filed for this visit. There is no height or weight on file to calculate BMI. Constitutional Well developed. Well nourished.  Vascular Dorsalis pedis pulses palpable bilaterally. Posterior tibial pulses palpable bilaterally. Capillary refill normal to all digits.  No cyanosis or clubbing noted. Pedal hair growth normal.  Neurologic Normal speech. Oriented to person, place,  and time. Epicritic sensation to light touch grossly present bilaterally.  Dermatologic Nails well groomed and normal in appearance. No open wounds. No skin lesions.  Orthopedic: No pain on palpation to the Achilles tendon insertion.  No pain along the course of peroneal tendon.  No pain at the ATFL ligament, posterior tibial tendon.  No pain with dorsiflexion of the ankle joint with plantarflexion of the joint or eversion inversion   Radiographs: None Assessment:   1. Achilles tendinitis of right lower extremity   2. Peroneal tendinitis of right lower extremity    Plan:  Patient was evaluated and treated and all questions answered.  Right Achilles tendinitis/peroneal tendinitis -Clinically PRP injection helped  healing considerably with 1 injection.  At this time I discussed shoe gear modification offloading padding protecting.  She can return to regular shoes without her activities.  I discussed with her that if it starts coming back to come and schedule it directly PRP injection.  She states understanding  No follow-ups on file.

## 2022-05-04 DIAGNOSIS — M25571 Pain in right ankle and joints of right foot: Secondary | ICD-10-CM | POA: Diagnosis not present

## 2022-05-04 DIAGNOSIS — R2689 Other abnormalities of gait and mobility: Secondary | ICD-10-CM | POA: Diagnosis not present

## 2022-05-11 ENCOUNTER — Ambulatory Visit
Admission: RE | Admit: 2022-05-11 | Discharge: 2022-05-11 | Disposition: A | Discharge status: Home / Self Care | Payer: BC Managed Care – PPO | Source: Ambulatory Visit | Attending: Internal Medicine | Admitting: Internal Medicine

## 2022-05-11 DIAGNOSIS — Z1231 Encounter for screening mammogram for malignant neoplasm of breast: Secondary | ICD-10-CM | POA: Diagnosis not present

## 2022-05-18 ENCOUNTER — Other Ambulatory Visit: Payer: Self-pay | Admitting: Internal Medicine

## 2022-05-21 ENCOUNTER — Ambulatory Visit (INDEPENDENT_AMBULATORY_CARE_PROVIDER_SITE_OTHER): Payer: BC Managed Care – PPO | Admitting: Internal Medicine

## 2022-05-21 ENCOUNTER — Encounter: Payer: Self-pay | Admitting: Internal Medicine

## 2022-05-21 VITALS — BP 122/86 | HR 86 | Temp 98.3°F | Ht 64.0 in | Wt 238.0 lb

## 2022-05-21 DIAGNOSIS — I10 Essential (primary) hypertension: Secondary | ICD-10-CM

## 2022-05-21 DIAGNOSIS — Z6841 Body Mass Index (BMI) 40.0 and over, adult: Secondary | ICD-10-CM

## 2022-05-21 DIAGNOSIS — Z0000 Encounter for general adult medical examination without abnormal findings: Secondary | ICD-10-CM | POA: Diagnosis not present

## 2022-05-21 DIAGNOSIS — M25571 Pain in right ankle and joints of right foot: Secondary | ICD-10-CM | POA: Diagnosis not present

## 2022-05-21 DIAGNOSIS — R2689 Other abnormalities of gait and mobility: Secondary | ICD-10-CM | POA: Diagnosis not present

## 2022-05-21 LAB — POCT URINALYSIS DIPSTICK
Bilirubin, UA: NEGATIVE
Blood, UA: NEGATIVE
Glucose, UA: NEGATIVE
Ketones, UA: NEGATIVE
Leukocytes, UA: NEGATIVE
Nitrite, UA: NEGATIVE
Protein, UA: NEGATIVE
Spec Grav, UA: <=1.005 — AB (ref 1.010–1.025)
Urobilinogen, UA: 0.2 E.U./dL
pH, UA: 5.5 (ref 5.0–8.0)

## 2022-05-21 MED ORDER — RYBELSUS 14 MG PO TABS: 14.0000 mg | ORAL_TABLET | Freq: Every day | ORAL | 2 refills | Status: DC

## 2022-05-21 NOTE — Patient Instructions (Signed)

## 2022-05-21 NOTE — Progress Notes (Signed)
Desiree Dunn,acting as a Education administrator for Desiree Greenland, MD.,have documented all relevant documentation on the behalf of Desiree Greenland, MD,as directed by  Desiree Greenland, MD while in the presence of Desiree Greenland, MD.  This visit occurred during the SARS-CoV-2 public health emergency.  Safety protocols were in place, including screening questions prior to the visit, additional usage of staff PPE, and extensive cleaning of exam room while observing appropriate contact time as indicated for disinfecting solutions.  Subjective:     Patient ID: Desiree Dunn , female    DOB: 11-22-1970 , 51 y.o.   MRN: [id number]   Chief Complaint  Patient presents with   Annual Exam   Hypertension    HPI  She is here today for a full physical exam. She is followed by Laurin Coder, NP at [facility] for her pelvic exams. She is s/p hysterectomy. Her last visit was June 2023. Mammogram was performed 05/11/22.   Hypertension This is a chronic problem. The current episode started more than 1 year ago. The problem has been gradually improving since onset. The problem is controlled. Pertinent negatives include no blurred vision. Risk factors for coronary artery disease include obesity. Past treatments include ACE inhibitors. The current treatment provides moderate improvement. Compliance problems include exercise.      Past Medical History:  Diagnosis Date   Allergy    Anxiety    Back pain    Chronic RLQ pain    since 1995 when she had ectopic preg   Constipation    Deviated septum    Diverticulitis    Ectopic pregnancy 1995   x 1 , R side    Eczema    GERD (gastroesophageal reflux disease)    HTN (hypertension) 05/24/2015   Migraine without aura and without status migrainosus, not intractable    Miscarriage    x 1   Obesity, unspecified 07/06/2010   OSA (obstructive sleep apnea)    Pelvic congestion    h/o    Sleep apnea    uses cpap     Family History  Problem Relation Age  of Onset   Colon cancer Mother 97   Colon cancer Maternal Aunt 18   Stroke Father    Diabetes Father    Hypertension Father    Hyperlipidemia Father    Heart disease Father    Obesity Father    Hypertension Sister    Colon polyps Brother    Colitis Brother    Depression Son    Asperger's syndrome Son    Breast cancer Paternal Grandmother    Prostate cancer Brother    Heart attack Neg Hx    Esophageal cancer Neg Hx    Rectal cancer Neg Hx    Stomach cancer Neg Hx      Current Outpatient Medications:    acetaminophen (TYLENOL) 500 MG tablet, Take 1,000 mg by mouth as needed for mild pain or headache., Disp: , Rfl:    albuterol (VENTOLIN HFA) 108 (90 Base) MCG/ACT inhaler, INHALE 2 PUFFS BY MOUTH EVERY 4 HOURS AS NEEDED, Disp: 18 each, Rfl: 1   Azelastine-Fluticasone 137-50 MCG/ACT SUSP, Place 1 spray into the nose 2 (two) times daily., Disp: 23 g, Rfl: 5   EPINEPHrine (EPIPEN 2-PAK) 0.3 mg/0.3 mL IJ SOAJ injection, Inject 0.3 mg into the muscle as needed for anaphylaxis., Disp: 2 each, Rfl: 2   hydrochlorothiazide (MICROZIDE) 12.5 MG capsule, TAKE 1 CAPSULE BY MOUTH EVERY DAY, Disp: 90 capsule, Rfl: 3  ibuprofen (ADVIL,MOTRIN) 200 MG tablet, Take 400 mg by mouth as needed for moderate pain., Disp: , Rfl:    montelukast (SINGULAIR) 10 MG tablet, TAKE 1 TABLET BY MOUTH EVERYDAY AT BEDTIME, Disp: 90 tablet, Rfl: 1   olmesartan (BENICAR) 40 MG tablet, TAKE 1 TABLET BY MOUTH EVERY DAY, Disp: 90 tablet, Rfl: 1   oxymetazoline (AFRIN NASAL SPRAY) 0.05 % nasal spray, Place 2 sprays into both nostrils 2 (two) times daily. Use no more than 3-5 days at a time., Disp: 30 mL, Rfl: 0   Semaglutide (RYBELSUS) 14 MG TABS, Take 1 tablet (14 mg total) by mouth daily., Disp: 30 tablet, Rfl: 2   TURMERIC PO, Take by mouth. AS NEEDED, Disp: , Rfl:    verapamil (CALAN-SR) 120 MG CR tablet, TAKE 1 TABLET BY MOUTH EVERY DAY, Disp: 90 tablet, Rfl: 1   Allergies  Allergen Reactions   Amoxicillin Hives     Hives      The patient states she uses status post hysterectomy for birth control. Last LMP was No LMP recorded. Patient has had a hysterectomy.. Negative for Dysmenorrhea. Negative for: breast discharge, breast lump(s), breast pain and breast self exam. Associated symptoms include abnormal vaginal bleeding. Pertinent negatives include abnormal bleeding (hematology), anxiety, decreased libido, depression, difficulty falling sleep, dyspareunia, history of infertility, nocturia, sexual dysfunction, sleep disturbances, urinary incontinence, urinary urgency, vaginal discharge and vaginal itching. Diet regular.The patient states her exercise level is  intermittent.  . The patient's tobacco use is:  Social History   Tobacco Use  Smoking Status Never  Smokeless Tobacco Never  . She has been exposed to passive smoke. The patient's alcohol use is:  Social History   Substance and Sexual Activity  Alcohol Use Yes   Alcohol/week: 0.0 standard drinks of alcohol   Comment: socially - 1 drink every 3 months    Review of Systems  Constitutional: Negative.   HENT: Negative.    Eyes: Negative.  Negative for blurred vision.  Respiratory: Negative.    Cardiovascular: Negative.   Gastrointestinal: Negative.   Endocrine: Negative.   Genitourinary: Negative.   Musculoskeletal: Negative.   Skin: Negative.   Allergic/Immunologic: Negative.   Neurological: Negative.   Hematological: Negative.   Psychiatric/Behavioral: Negative.       Today's Vitals   05/21/22 1412  BP: 122/86  Pulse: 86  Temp: 98.3 F (36.8 C)  Weight: 238 lb (108 kg)  Height: $Remove'5\' 4"'ehdqrki$  (1.626 m)  PainSc: 0-No pain   Body mass index is 40.85 kg/m.  Wt Readings from Last 3 Encounters:  05/21/22 238 lb (108 kg)  03/21/22 236 lb 6.4 oz (107.2 kg)  01/26/22 232 lb 4 oz (105.3 kg)     Objective:  Physical Exam Vitals and nursing note reviewed.  Constitutional:      Appearance: Normal appearance.  HENT:     Head:  Normocephalic and atraumatic.     Right Ear: Tympanic membrane, ear canal and external ear normal.     Left Ear: Tympanic membrane, ear canal and external ear normal.     Nose: Nose normal.     Mouth/Throat:     Mouth: Mucous membranes are moist.     Pharynx: Oropharynx is clear.  Eyes:     Extraocular Movements: Extraocular movements intact.     Conjunctiva/sclera: Conjunctivae normal.     Pupils: Pupils are equal, round, and reactive to light.  Cardiovascular:     Rate and Rhythm: Normal rate and regular rhythm.  Pulses: Normal pulses.     Heart sounds: Normal heart sounds.  Pulmonary:     Effort: Pulmonary effort is normal.     Breath sounds: Normal breath sounds.  Chest:  Breasts:    Tanner Score is 5.     Right: Normal.     Left: Normal.     Comments: B/l healed surgical scars Abdominal:     General: Bowel sounds are normal.     Palpations: Abdomen is soft.     Comments: Obese, soft  Genitourinary:    Comments: deferred Musculoskeletal:        General: Normal range of motion.     Cervical back: Normal range of motion and neck supple.  Skin:    General: Skin is warm and dry.  Neurological:     General: No focal deficit present.     Mental Status: She is alert and oriented to person, place, and time.  Psychiatric:        Mood and Affect: Mood normal.        Behavior: Behavior normal.      Assessment And Plan:     1. Encounter for general adult medical examination w/o abnormal findings Comments: A full exam was performed. Importance of monthly self breast exams was discussed with the patient.  PATIENT IS ADVISED TO GET 30-45 MINUTES REGULAR EXERCISE NO LESS THAN FOUR TO FIVE DAYS PER WEEK - BOTH WEIGHTBEARING EXERCISES AND AEROBIC ARE RECOMMENDED.  PATIENT IS ADVISED TO FOLLOW A HEALTHY DIET WITH AT LEAST SIX FRUITS/VEGGIES PER DAY, DECREASE INTAKE OF RED MEAT, AND TO INCREASE FISH INTAKE TO TWO DAYS PER WEEK.  MEATS/FISH SHOULD NOT BE FRIED, BAKED OR BROILED IS  PREFERABLE.  IT IS ALSO IMPORTANT TO CUT BACK ON YOUR SUGAR INTAKE. PLEASE AVOID ANYTHING WITH ADDED SUGAR, CORN SYRUP OR OTHER SWEETENERS. IF YOU MUST USE A SWEETENER, YOU CAN TRY STEVIA. IT IS ALSO IMPORTANT TO AVOID ARTIFICIALLY SWEETENERS AND DIET BEVERAGES. LASTLY, I SUGGEST WEARING SPF 50 SUNSCREEN ON EXPOSED PARTS AND ESPECIALLY WHEN IN THE DIRECT SUNLIGHT FOR AN EXTENDED PERIOD OF TIME.  PLEASE AVOID FAST FOOD RESTAURANTS AND INCREASE YOUR WATER INTAKE.  - CBC - CMP14+EGFR - Lipid panel - Hemoglobin A1c - Insulin, random(561) - Vitamin B12  2. Essential hypertension Comments: Chronic, well controlled. EKG performed, NSR w/ nonspecific T abnormality.  She will c/w olmesartan $RemoveBefore'40mg'yFTfCpdAuZSRD$  and hctz 12.$RemoveBef'5mg'uGAbGTHxMX$  daily. She will f/u in 6 months.  - POCT Urinalysis Dipstick (81002) - Microalbumin / Creatinine Urine Ratio - EKG 12-Lead  3. Class 3 severe obesity due to excess calories with serious comorbidity and body mass index (BMI) of 40.0 to 44.9 in adult [facility]) Comments: She has gained 6 lbs since March 2023. She is encouraged to gradually increase her daily activity, aiming for at least 150 minutes/week.   Patient was given opportunity to ask questions. Patient verbalized understanding of the plan and was able to repeat key elements of the plan. All questions were answered to their satisfaction.   I, Desiree Greenland, MD, have reviewed all documentation for this visit. The documentation on 05/21/22 for the exam, diagnosis, procedures, and orders are all accurate and complete.  THE PATIENT IS ENCOURAGED TO PRACTICE SOCIAL DISTANCING DUE TO THE COVID-19 PANDEMIC.

## 2022-05-22 LAB — CBC
Hematocrit: 40.7 % (ref 34.0–46.6)
Hemoglobin: 13.8 g/dL (ref 11.1–15.9)
MCH: 30.3 pg (ref 26.6–33.0)
MCHC: 33.9 g/dL (ref 31.5–35.7)
MCV: 90 fL (ref 79–97)
Platelets: 256 10*3/uL (ref 150–450)
RBC: 4.55 x10E6/uL (ref 3.77–5.28)
RDW: 13.2 % (ref 11.7–15.4)
WBC: 6.3 10*3/uL (ref 3.4–10.8)

## 2022-05-22 LAB — HEMOGLOBIN A1C
Est. average glucose Bld gHb Est-mCnc: 117 mg/dL
Hgb A1c MFr Bld: 5.7 % — ABNORMAL HIGH (ref 4.8–5.6)

## 2022-05-22 LAB — MICROALBUMIN / CREATININE URINE RATIO
Creatinine, Urine: 46.9 mg/dL
Microalb/Creat Ratio: <6 mg/g creat (ref 0–29)
Microalbumin, Urine: <3 ug/mL

## 2022-05-22 LAB — CMP14+EGFR
ALT: 57 IU/L — ABNORMAL HIGH (ref 0–32)
AST: 33 IU/L (ref 0–40)
Albumin/Globulin Ratio: 1.5 (ref 1.2–2.2)
Albumin: 4.8 g/dL (ref 3.8–4.9)
Alkaline Phosphatase: 72 IU/L (ref 44–121)
BUN/Creatinine Ratio: 10 (ref 9–23)
BUN: 10 mg/dL (ref 6–24)
Bilirubin Total: 0.3 mg/dL (ref 0.0–1.2)
CO2: 24 mmol/L (ref 20–29)
Calcium: 10 mg/dL (ref 8.7–10.2)
Chloride: 101 mmol/L (ref 96–106)
Creatinine, Ser: 0.98 mg/dL (ref 0.57–1.00)
Globulin, Total: 3.3 g/dL (ref 1.5–4.5)
Glucose: 93 mg/dL (ref 70–99)
Potassium: 4.1 mmol/L (ref 3.5–5.2)
Sodium: 140 mmol/L (ref 134–144)
Total Protein: 8.1 g/dL (ref 6.0–8.5)
eGFR: 69 mL/min/{1.73_m2} (ref >59)

## 2022-05-22 LAB — INSULIN, RANDOM: INSULIN: 126 u[IU]/mL — ABNORMAL HIGH (ref 2.6–24.9)

## 2022-05-22 LAB — LIPID PANEL
Chol/HDL Ratio: 3.3 ratio (ref 0.0–4.4)
Cholesterol, Total: 216 mg/dL — ABNORMAL HIGH (ref 100–199)
HDL: 66 mg/dL (ref >39)
LDL Chol Calc (NIH): 121 mg/dL — ABNORMAL HIGH (ref 0–99)
Triglycerides: 166 mg/dL — ABNORMAL HIGH (ref 0–149)
VLDL Cholesterol Cal: 29 mg/dL (ref 5–40)

## 2022-05-22 LAB — VITAMIN B12: Vitamin B-12: >2000 pg/mL — ABNORMAL HIGH (ref 232–1245)

## 2022-05-27 ENCOUNTER — Encounter: Payer: Self-pay | Admitting: Podiatry

## 2022-05-28 NOTE — Telephone Encounter (Signed)
Please advise 

## 2022-06-04 ENCOUNTER — Other Ambulatory Visit: Payer: Self-pay | Admitting: Internal Medicine

## 2022-06-13 DIAGNOSIS — M25571 Pain in right ankle and joints of right foot: Secondary | ICD-10-CM | POA: Diagnosis not present

## 2022-06-13 DIAGNOSIS — R2689 Other abnormalities of gait and mobility: Secondary | ICD-10-CM | POA: Diagnosis not present

## 2022-06-22 DIAGNOSIS — R2689 Other abnormalities of gait and mobility: Secondary | ICD-10-CM | POA: Diagnosis not present

## 2022-06-22 DIAGNOSIS — M25571 Pain in right ankle and joints of right foot: Secondary | ICD-10-CM | POA: Diagnosis not present

## 2022-07-06 ENCOUNTER — Encounter: Payer: Self-pay | Admitting: Podiatry

## 2022-07-06 ENCOUNTER — Ambulatory Visit (INDEPENDENT_AMBULATORY_CARE_PROVIDER_SITE_OTHER): Payer: BC Managed Care – PPO | Admitting: Podiatry

## 2022-07-06 DIAGNOSIS — M7671 Peroneal tendinitis, right leg: Secondary | ICD-10-CM | POA: Diagnosis not present

## 2022-07-06 DIAGNOSIS — M7661 Achilles tendinitis, right leg: Secondary | ICD-10-CM

## 2022-07-06 MED ORDER — TRIAMCINOLONE ACETONIDE 10 MG/ML IJ SUSP
10.0000 mg | Freq: Once | INTRAMUSCULAR | Status: AC
  Administered 2022-07-06: 10 mg

## 2022-07-09 NOTE — Progress Notes (Signed)
Subjective:   Patient ID: Desiree Dunn, female   DOB: 52 y.o.   MRN: [id number]   HPI Patient states she is going on vacation she is getting pain in her right back of her heel but it seems to be in a slightly different place   ROS      Objective:  Physical Exam  Neurovascular status intact with inflammation pain more around the peroneal tendon right versus the Achilles tendon which seems to be improved     Assessment:  Possibility for peroneal tendinitis right versus Achilles tendinitis right     Plan:  Reviewed condition may require ultimate MRI ability to continue to try to work with it conservatively and I did do sterile prep and I carefully injected the peroneal sheath 3 mg Dexasone Kenalog 5 mg Xylocaine advised on reduced activity and reappoint to recheck after vacation

## 2022-07-24 ENCOUNTER — Ambulatory Visit: Payer: BC Managed Care – PPO | Admitting: Internal Medicine

## 2022-07-27 ENCOUNTER — Ambulatory Visit: Payer: BC Managed Care – PPO | Admitting: Internal Medicine

## 2022-08-01 ENCOUNTER — Ambulatory Visit: Payer: BC Managed Care – PPO | Admitting: Allergy

## 2022-08-06 ENCOUNTER — Ambulatory Visit: Payer: BC Managed Care – PPO | Admitting: Internal Medicine

## 2022-08-21 ENCOUNTER — Encounter: Payer: Self-pay | Admitting: Internal Medicine

## 2022-08-21 ENCOUNTER — Other Ambulatory Visit: Payer: Self-pay | Admitting: Internal Medicine

## 2022-08-21 ENCOUNTER — Ambulatory Visit (INDEPENDENT_AMBULATORY_CARE_PROVIDER_SITE_OTHER): Payer: BC Managed Care – PPO | Admitting: Internal Medicine

## 2022-08-21 VITALS — BP 124/78 | HR 83 | Temp 98.2°F | Ht 64.0 in | Wt 233.0 lb

## 2022-08-21 DIAGNOSIS — I10 Essential (primary) hypertension: Secondary | ICD-10-CM | POA: Diagnosis not present

## 2022-08-21 DIAGNOSIS — R590 Localized enlarged lymph nodes: Secondary | ICD-10-CM

## 2022-08-21 DIAGNOSIS — H6121 Impacted cerumen, right ear: Secondary | ICD-10-CM

## 2022-08-21 DIAGNOSIS — R7989 Other specified abnormal findings of blood chemistry: Secondary | ICD-10-CM

## 2022-08-21 DIAGNOSIS — Z23 Encounter for immunization: Secondary | ICD-10-CM

## 2022-08-21 DIAGNOSIS — Z6839 Body mass index (BMI) 39.0-39.9, adult: Secondary | ICD-10-CM

## 2022-08-21 DIAGNOSIS — E88819 Insulin resistance, unspecified: Secondary | ICD-10-CM | POA: Diagnosis not present

## 2022-08-21 MED ORDER — SAXENDA 18 MG/3ML ~~LOC~~ SOPN
3.0000 mg | PEN_INJECTOR | Freq: Every day | SUBCUTANEOUS | 0 refills | Status: DC
Start: 2022-08-21 — End: 2022-11-24

## 2022-08-21 NOTE — Patient Instructions (Signed)
Hypertension, Adult ?Hypertension is another name for high blood pressure. High blood pressure forces your heart to work harder to pump blood. This can cause problems over time. ?There are two numbers in a blood pressure reading. There is a top number (systolic) over a bottom number (diastolic). It is best to have a blood pressure that is below 120/80. ?What are the causes? ?The cause of this condition is not known. Some other conditions can lead to high blood pressure. ?What increases the risk? ?Some lifestyle factors can make you more likely to develop high blood pressure: ?Smoking. ?Not getting enough exercise or physical activity. ?Being overweight. ?Having too much fat, sugar, calories, or salt (sodium) in your diet. ?Drinking too much alcohol. ?Other risk factors include: ?Having any of these conditions: ?Heart disease. ?Diabetes. ?High cholesterol. ?Kidney disease. ?Obstructive sleep apnea. ?Having a family history of high blood pressure and high cholesterol. ?Age. The risk increases with age. ?Stress. ?What are the signs or symptoms? ?High blood pressure may not cause symptoms. Very high blood pressure (hypertensive crisis) may cause: ?Headache. ?Fast or uneven heartbeats (palpitations). ?Shortness of breath. ?Nosebleed. ?Vomiting or feeling like you may vomit (nauseous). ?Changes in how you see. ?Very bad chest pain. ?Feeling dizzy. ?Seizures. ?How is this treated? ?This condition is treated by making healthy lifestyle changes, such as: ?Eating healthy foods. ?Exercising more. ?Drinking less alcohol. ?Your doctor may prescribe medicine if lifestyle changes do not help enough and if: ?Your top number is above 130. ?Your bottom number is above 80. ?Your personal target blood pressure may vary. ?Follow these instructions at home: ?Eating and drinking ? ?If told, follow the DASH eating plan. To follow this plan: ?Fill one half of your plate at each meal with fruits and vegetables. ?Fill one fourth of your plate  at each meal with whole grains. Whole grains include whole-wheat pasta, brown rice, and whole-grain bread. ?Eat or drink low-fat dairy products, such as skim milk or low-fat yogurt. ?Fill one fourth of your plate at each meal with low-fat (lean) proteins. Low-fat proteins include fish, chicken without skin, eggs, beans, and tofu. ?Avoid fatty meat, cured and processed meat, or chicken with skin. ?Avoid pre-made or processed food. ?Limit the amount of salt in your diet to less than 1,500 mg each day. ?Do not drink alcohol if: ?Your doctor tells you not to drink. ?You are pregnant, may be pregnant, or are planning to become pregnant. ?If you drink alcohol: ?Limit how much you have to: ?0-1 drink a day for women. ?0-2 drinks a day for men. ?Know how much alcohol is in your drink. In the [place] one drink equals one 12 oz bottle of beer (355 mL), one 5 oz glass of wine (148 mL), or one 1? oz glass of hard liquor (44 mL). ?Lifestyle ? ?Work with your doctor to stay at a healthy weight or to lose weight. Ask your doctor what the best weight is for you. ?Get at least 30 minutes of exercise that causes your heart to beat faster (aerobic exercise) most days of the week. This may include walking, swimming, or biking. ?Get at least 30 minutes of exercise that strengthens your muscles (resistance exercise) at least 3 days a week. This may include lifting weights or doing Pilates. ?Do not smoke or use any products that contain nicotine or tobacco. If you need help quitting, ask your doctor. ?Check your blood pressure at home as told by your doctor. ?Keep all follow-up visits. ?Medicines ?Take over-the-counter and prescription medicines   only as told by your doctor. Follow directions carefully. ?Do not skip doses of blood pressure medicine. The medicine does not work as well if you skip doses. Skipping doses also puts you at risk for problems. ?Ask your doctor about side effects or reactions to medicines that you should watch  for. ?Contact a doctor if: ?You think you are having a reaction to the medicine you are taking. ?You have headaches that keep coming back. ?You feel dizzy. ?You have swelling in your ankles. ?You have trouble with your vision. ?Get help right away if: ?You get a very bad headache. ?You start to feel mixed up (confused). ?You feel weak or numb. ?You feel faint. ?You have very bad pain in your: ?Chest. ?Belly (abdomen). ?You vomit more than once. ?You have trouble breathing. ?These symptoms may be an emergency. Get help right away. Call 911. ?Do not wait to see if the symptoms will go away. ?Do not drive yourself to the hospital. ?Summary ?Hypertension is another name for high blood pressure. ?High blood pressure forces your heart to work harder to pump blood. ?For most people, a normal blood pressure is less than 120/80. ?Making healthy choices can help lower blood pressure. If your blood pressure does not get lower with healthy choices, you may need to take medicine. ?This information is not intended to replace advice given to you by your health care provider. Make sure you discuss any questions you have with your health care provider. ?Document Revised: 08/17/2021 Document Reviewed: 08/17/2021 ?Elsevier Patient Education ? 2023 Else[employer] Inc. ? ?

## 2022-08-21 NOTE — Progress Notes (Signed)
[name],acting as a Education administrator for [name], MD.,have documented all relevant documentation on the behalf of [name], MD,as directed by  [name], MD while in the presence of [name], MD.    Subjective:     Patient ID: Desiree Dunn , female    DOB: 1971-02-19 , 51 y.o.   MRN: [id number]   Chief Complaint  Patient presents with   Hypertension    HPI  Patient presents today for BP follow up. Patient states compliance with medications. She denies headaches, chest pain and shortness of breath.  Patient has a knot behind her right ear that she states is sore, she first noticed it 2-3 days ago. She denies fever, chills, ear drainage and hearing issues.   BP Readings from Last 3 Encounters: 08/21/22 : 124/78 05/21/22 : 122/86 03/21/22 : 122/80    Hypertension This is a chronic problem. The current episode started more than 1 year ago. The problem has been gradually improving since onset. The problem is controlled. Pertinent negatives include no blurred vision, chest pain, palpitations or shortness of breath. Risk factors for coronary artery disease include obesity and sedentary lifestyle. Past treatments include angiotensin blockers and calcium channel blockers. The current treatment provides moderate improvement.     Past Medical History:  Diagnosis Date   Allergy    Anxiety    Back pain    Chronic RLQ pain    since 1995 when she had ectopic preg   Constipation    Deviated septum    Diverticulitis    Ectopic pregnancy 1995   x 1 , R side    Eczema    GERD (gastroesophageal reflux disease)    HTN (hypertension) 05/24/2015   Migraine without aura and without status migrainosus, not intractable    Miscarriage    x 1   Obesity, unspecified 07/06/2010   OSA (obstructive sleep apnea)    Pelvic congestion    h/o    Sleep apnea    uses cpap     Family History  Problem Relation Age of Onset   Colon cancer Mother 2   Colon cancer  Maternal Aunt 59   Stroke Father    Diabetes Father    Hypertension Father    Hyperlipidemia Father    Heart disease Father    Obesity Father    Hypertension Sister    Colon polyps Brother    Colitis Brother    Depression Son    Asperger's syndrome Son    Breast cancer Paternal Grandmother    Prostate cancer Brother    Heart attack Neg Hx    Esophageal cancer Neg Hx    Rectal cancer Neg Hx    Stomach cancer Neg Hx      Current Outpatient Medications:    acetaminophen (TYLENOL) 500 MG tablet, Take 1,000 mg by mouth as needed for mild pain or headache., Disp: , Rfl:    albuterol (VENTOLIN HFA) 108 (90 Base) MCG/ACT inhaler, INHALE 2 PUFFS BY MOUTH EVERY 4 HOURS AS NEEDED, Disp: 18 each, Rfl: 1   Azelastine-Fluticasone 137-50 MCG/ACT SUSP, Place 1 spray into the nose 2 (two) times daily., Disp: 23 g, Rfl: 5   EPINEPHrine (EPIPEN 2-PAK) 0.3 mg/0.3 mL IJ SOAJ injection, Inject 0.3 mg into the muscle as needed for anaphylaxis., Disp: 2 each, Rfl: 2   hydrochlorothiazide (MICROZIDE) 12.5 MG capsule, TAKE 1 CAPSULE BY MOUTH EVERY DAY, Disp: 90 capsule, Rfl: 3   ibuprofen (ADVIL,MOTRIN) 200  MG tablet, Take 400 mg by mouth as needed for moderate pain., Disp: , Rfl:    Liraglutide -Weight Management (SAXENDA) 18 MG/3ML SOPN, Inject 3 mg into the skin daily., Disp: 3 mL, Rfl: 0   montelukast (SINGULAIR) 10 MG tablet, TAKE 1 TABLET BY MOUTH EVERYDAY AT BEDTIME, Disp: 90 tablet, Rfl: 1   olmesartan (BENICAR) 40 MG tablet, TAKE 1 TABLET BY MOUTH EVERY DAY, Disp: 90 tablet, Rfl: 1   oxymetazoline (AFRIN NASAL SPRAY) 0.05 % nasal spray, Place 2 sprays into both nostrils 2 (two) times daily. Use no more than 3-5 days at a time., Disp: 30 mL, Rfl: 0   TURMERIC PO, Take by mouth. AS NEEDED, Disp: , Rfl:    verapamil (CALAN-SR) 120 MG CR tablet, TAKE 1 TABLET BY MOUTH EVERY DAY, Disp: 90 tablet, Rfl: 1   Allergies  Allergen Reactions   Amoxicillin Hives    Hives     Review of Systems   Constitutional: Negative.   HENT:         There is "knot" behind her ear that is tender. No fever/chills, ear discharge.   Eyes:  Negative for blurred vision.  Respiratory: Negative.  Negative for shortness of breath.   Cardiovascular: Negative.  Negative for chest pain and palpitations.  Gastrointestinal: Negative.   Neurological: Negative.   Psychiatric/Behavioral: Negative.       Today's Vitals   08/21/22 0855  BP: 124/78  Pulse: 83  Temp: 98.2 F (36.8 C)  TempSrc: Oral  Weight: 233 lb (105.7 kg)  Height: $Remove'5\' 4"'fHYgOyL$  (1.626 m)  PainSc: 0-No pain   Body mass index is 39.99 kg/m.  Wt Readings from Last 3 Encounters:  08/21/22 233 lb (105.7 kg)  05/21/22 238 lb (108 kg)  03/21/22 236 lb 6.4 oz (107.2 kg)    Objective:  Physical Exam Vitals and nursing note reviewed.  Constitutional:      Appearance: Normal appearance. She is obese.  HENT:     Head: Normocephalic and atraumatic.     Right Ear: Ear canal and external ear normal. There is impacted cerumen.     Left Ear: Tympanic membrane, ear canal and external ear normal. There is no impacted cerumen.     Nose:     Comments: Masked     Mouth/Throat:     Comments: Masked  Eyes:     Extraocular Movements: Extraocular movements intact.  Cardiovascular:     Rate and Rhythm: Normal rate and regular rhythm.     Heart sounds: Normal heart sounds.  Pulmonary:     Effort: Pulmonary effort is normal.     Breath sounds: Normal breath sounds.  Musculoskeletal:     Cervical back: Normal range of motion.  Skin:    General: Skin is warm.  Neurological:     General: No focal deficit present.     Mental Status: She is alert.  Psychiatric:        Mood and Affect: Mood normal.        Behavior: Behavior normal.       Assessment And Plan:     1. Essential hypertension Comments: Chronic, well controlled. She will c/w olmesartan $RemoveBefore'40mg'WmuqJYkYpwexE$  and hctz 12.$RemoveBef'5mg'EXePNAWUwz$  daily.  She will f/u in January 2024.  - BMP8+eGFR  2. Insulin  resistance Comments: Unfortunately, her insurance is no longer covering Rybelsus.  - Hemoglobin A1c  3. Elevated liver function tests Comments: She reports she has cut back on her NSAID use, I will check labs as listed  below.  - Liver Profile - Gamma GT, GGT (21624)  4. Impacted cerumen of right ear Comments: She prefers to use OTC products to flush her ears at home. This was not addressed today during her visit.   5. Posterior auricular lymphadenopathy Comments: Pt also with seasonal allergies w/ PND. She is advised to let me know if her sx persist. I do not think she has underlying infection.   6. Class 2 severe obesity due to excess calories with serious comorbidity and body mass index (BMI) of 39.0 to 39.9 in adult [facility]) Comments: She was congratulated on losing 5lbs since July 2023. I will send rx Saxenda to her pharmacy, she denies personal/family h/o thyroid cancer.   7. Need for influenza vaccination - Flu Vaccine QUAD 6+ mos PF IM (Fluarix Quad PF)   Patient was given opportunity to ask questions. Patient verbalized understanding of the plan and was able to repeat key elements of the plan. All questions were answered to their satisfaction.   I, [name], MD, have reviewed all documentation for this visit. The documentation on 08/21/22 for the exam, diagnosis, procedures, and orders are all accurate and complete.   IF YOU HAVE BEEN REFERRED TO A SPECIALIST, IT MAY TAKE 1-2 WEEKS TO SCHEDULE/PROCESS THE REFERRAL. IF YOU HAVE NOT HEARD FROM US/SPECIALIST IN TWO WEEKS, PLEASE GIVE [place] A CALL AT [phone] X [phone].   THE PATIENT IS ENCOURAGED TO PRACTICE SOCIAL DISTANCING DUE TO THE COVID-19 PANDEMIC.

## 2022-08-22 DIAGNOSIS — G4733 Obstructive sleep apnea (adult) (pediatric): Secondary | ICD-10-CM | POA: Diagnosis not present

## 2022-08-22 LAB — BMP8+EGFR
BUN/Creatinine Ratio: 11 (ref 9–23)
BUN: 11 mg/dL (ref 6–24)
CO2: 21 mmol/L (ref 20–29)
Calcium: 10.1 mg/dL (ref 8.7–10.2)
Chloride: 101 mmol/L (ref 96–106)
Creatinine, Ser: 0.99 mg/dL (ref 0.57–1.00)
Glucose: 88 mg/dL (ref 70–99)
Potassium: 4.3 mmol/L (ref 3.5–5.2)
Sodium: 141 mmol/L (ref 134–144)
eGFR: 69 mL/min/{1.73_m2} (ref >59)

## 2022-08-22 LAB — GAMMA GT: GGT: 32 IU/L (ref 0–60)

## 2022-08-22 LAB — HEPATIC FUNCTION PANEL
ALT: 53 IU/L — ABNORMAL HIGH (ref 0–32)
AST: 40 IU/L (ref 0–40)
Albumin: 4.7 g/dL (ref 3.8–4.9)
Alkaline Phosphatase: 77 IU/L (ref 44–121)
Bilirubin Total: 0.4 mg/dL (ref 0.0–1.2)
Bilirubin, Direct: 0.12 mg/dL (ref 0.00–0.40)
Total Protein: 7.7 g/dL (ref 6.0–8.5)

## 2022-08-22 LAB — HEMOGLOBIN A1C
Est. average glucose Bld gHb Est-mCnc: 114 mg/dL
Hgb A1c MFr Bld: 5.6 % (ref 4.8–5.6)

## 2022-09-22 DIAGNOSIS — G4733 Obstructive sleep apnea (adult) (pediatric): Secondary | ICD-10-CM | POA: Diagnosis not present

## 2022-09-28 DIAGNOSIS — R350 Frequency of micturition: Secondary | ICD-10-CM | POA: Diagnosis not present

## 2022-09-28 DIAGNOSIS — R300 Dysuria: Secondary | ICD-10-CM | POA: Diagnosis not present

## 2022-09-28 DIAGNOSIS — N951 Menopausal and female climacteric states: Secondary | ICD-10-CM | POA: Diagnosis not present

## 2022-10-02 ENCOUNTER — Encounter: Payer: Self-pay | Admitting: Internal Medicine

## 2022-10-03 ENCOUNTER — Other Ambulatory Visit: Payer: Self-pay | Admitting: Internal Medicine

## 2022-10-03 MED ORDER — CYCLOBENZAPRINE HCL 10 MG PO TABS: ORAL_TABLET | ORAL | 0 refills | Status: DC

## 2022-10-18 ENCOUNTER — Other Ambulatory Visit: Payer: Self-pay | Admitting: Internal Medicine

## 2022-10-22 DIAGNOSIS — G4733 Obstructive sleep apnea (adult) (pediatric): Secondary | ICD-10-CM | POA: Diagnosis not present

## 2022-10-31 ENCOUNTER — Ambulatory Visit: Payer: BC Managed Care – PPO | Admitting: Internal Medicine

## 2022-11-07 DIAGNOSIS — M6281 Muscle weakness (generalized): Secondary | ICD-10-CM | POA: Diagnosis not present

## 2022-11-07 DIAGNOSIS — M5459 Other low back pain: Secondary | ICD-10-CM | POA: Diagnosis not present

## 2022-11-09 DIAGNOSIS — M6281 Muscle weakness (generalized): Secondary | ICD-10-CM | POA: Diagnosis not present

## 2022-11-09 DIAGNOSIS — M5459 Other low back pain: Secondary | ICD-10-CM | POA: Diagnosis not present

## 2022-11-21 ENCOUNTER — Encounter: Payer: Self-pay | Admitting: Internal Medicine

## 2022-11-21 ENCOUNTER — Ambulatory Visit (INDEPENDENT_AMBULATORY_CARE_PROVIDER_SITE_OTHER): Payer: BC Managed Care – PPO | Admitting: Internal Medicine

## 2022-11-21 VITALS — BP 110/78 | HR 87 | Temp 98.1°F | Ht 64.0 in | Wt 240.0 lb

## 2022-11-21 DIAGNOSIS — I10 Essential (primary) hypertension: Secondary | ICD-10-CM | POA: Diagnosis not present

## 2022-11-21 DIAGNOSIS — E559 Vitamin D deficiency, unspecified: Secondary | ICD-10-CM | POA: Diagnosis not present

## 2022-11-21 DIAGNOSIS — E88819 Insulin resistance, unspecified: Secondary | ICD-10-CM

## 2022-11-21 DIAGNOSIS — M5416 Radiculopathy, lumbar region: Secondary | ICD-10-CM | POA: Diagnosis not present

## 2022-11-21 DIAGNOSIS — Z6841 Body Mass Index (BMI) 40.0 and over, adult: Secondary | ICD-10-CM

## 2022-11-21 DIAGNOSIS — R7989 Other specified abnormal findings of blood chemistry: Secondary | ICD-10-CM | POA: Diagnosis not present

## 2022-11-21 DIAGNOSIS — E66813 Obesity, class 3: Secondary | ICD-10-CM

## 2022-11-21 MED ORDER — TRIAMCINOLONE ACETONIDE 40 MG/ML IJ SUSP
60.0000 mg | Freq: Once | INTRAMUSCULAR | Status: AC
  Administered 2022-11-21: 60 mg via INTRAMUSCULAR

## 2022-11-21 NOTE — Progress Notes (Signed)
[name],acting as a Education administrator for [name], MD.,have documented all relevant documentation on the behalf of [name], MD,as directed by  [name], MD while in the presence of [name], MD.    Subjective:     Patient ID: Desiree Dunn , female    DOB: [birth year] , 52 y.o.   MRN: [id number]   Chief Complaint  Patient presents with   Hypertension    HPI  Patient presents today for BP follow up. Patient states compliance with medications. She denies headaches, chest pain and shortness of breath.   Today, she is most concerned about back pain. Patient reports she is doing physical therapy but she is still having a lot of pain.  At times, it is unbearable. She denies having any urinary/fecal incontinence and LE weakness.    Hypertension This is a chronic problem. The current episode started more than 1 year ago. The problem has been gradually improving since onset. The problem is controlled. Pertinent negatives include no blurred vision. Risk factors for coronary artery disease include obesity and sedentary lifestyle. Past treatments include angiotensin blockers and calcium channel blockers. The current treatment provides moderate improvement.     Past Medical History:  Diagnosis Date   Allergy    Anxiety    Back pain    Chronic RLQ pain    since 1995 when she had ectopic preg   Constipation    Deviated septum    Diverticulitis    Ectopic pregnancy 1995   x 1 , R side    Eczema    GERD (gastroesophageal reflux disease)    HTN (hypertension) 05/24/2015   Migraine without aura and without status migrainosus, not intractable    Miscarriage    x 1   Obesity, unspecified 07/06/2010   OSA (obstructive sleep apnea)    Pelvic congestion    h/o    Sleep apnea    uses cpap     Family History  Problem Relation Age of Onset   Colon cancer Mother 5   Colon cancer Maternal Aunt 21   Stroke Father    Diabetes Father    Hypertension  Father    Hyperlipidemia Father    Heart disease Father    Obesity Father    Hypertension Sister    Colon polyps Brother    Colitis Brother    Depression Son    Asperger's syndrome Son    Breast cancer Paternal Grandmother    Prostate cancer Brother    Heart attack Neg Hx    Esophageal cancer Neg Hx    Rectal cancer Neg Hx    Stomach cancer Neg Hx      Current Outpatient Medications:    acetaminophen (TYLENOL) 500 MG tablet, Take 1,000 mg by mouth as needed for mild pain or headache., Disp: , Rfl:    albuterol (VENTOLIN HFA) 108 (90 Base) MCG/ACT inhaler, INHALE 2 PUFFS BY MOUTH EVERY 4 HOURS AS NEEDED, Disp: 18 each, Rfl: 1   Azelastine-Fluticasone 137-50 MCG/ACT SUSP, Place 1 spray into the nose 2 (two) times daily., Disp: 23 g, Rfl: 5   cyclobenzaprine (FLEXERIL) 10 MG tablet, TAKE 1 TABLET BY MOUTH AT BEDTIME AS NEEDED FOR MUSCLE SPASMS, Disp: 20 tablet, Rfl: 0   EPINEPHrine (EPIPEN 2-PAK) 0.3 mg/0.3 mL IJ SOAJ injection, Inject 0.3 mg into the muscle as needed for anaphylaxis., Disp: 2 each, Rfl: 2   estradiol (VIVELLE-DOT) 0.05 MG/24HR patch, Place 1 patch onto the skin  2 (two) times a week., Disp: , Rfl:    hydrochlorothiazide (MICROZIDE) 12.5 MG capsule, TAKE 1 CAPSULE BY MOUTH EVERY DAY, Disp: 90 capsule, Rfl: 3   montelukast (SINGULAIR) 10 MG tablet, TAKE 1 TABLET BY MOUTH EVERYDAY AT BEDTIME, Disp: 90 tablet, Rfl: 1   olmesartan (BENICAR) 40 MG tablet, TAKE 1 TABLET BY MOUTH EVERY DAY, Disp: 90 tablet, Rfl: 1   oxymetazoline (AFRIN NASAL SPRAY) 0.05 % nasal spray, Place 2 sprays into both nostrils 2 (two) times daily. Use no more than 3-5 days at a time., Disp: 30 mL, Rfl: 0   [START ON 01/21/2023] Semaglutide-Weight Management 1 MG/0.5ML SOAJ, Inject 1 mg into the skin once a week for 28 days., Disp: 2 mL, Rfl: 0   TURMERIC PO, Take by mouth. AS NEEDED, Disp: , Rfl:    verapamil (CALAN-SR) 120 MG CR tablet, TAKE 1 TABLET BY MOUTH EVERY DAY, Disp: 90 tablet, Rfl: 1    Allergies  Allergen Reactions   Amoxicillin Hives    Hives     Review of Systems  Constitutional: Negative.   Eyes: Negative.  Negative for blurred vision.  Respiratory: Negative.    Cardiovascular: Negative.   Musculoskeletal:  Positive for back pain.       She c/o r sided LBP w/ radiation to r leg. Occasional numbness of RLE. Denies urinary/fecal incontinence.   Skin: Negative.   Neurological: Negative.   Psychiatric/Behavioral: Negative.       Today's Vitals   11/21/22 1529  BP: 110/78  Pulse: 87  Temp: 98.1 F (36.7 C)  Weight: 240 lb (108.9 kg)  Height: '5\' 4"'$  (1.626 m)  PainSc: 10-Worst pain ever  PainLoc: Back   Body mass index is 41.2 kg/m.  Wt Readings from Last 3 Encounters:  11/21/22 240 lb (108.9 kg)  08/21/22 233 lb (105.7 kg)  05/21/22 238 lb (108 kg)     Objective:  Physical Exam Vitals and nursing note reviewed.  Constitutional:      Appearance: Normal appearance.  HENT:     Head: Normocephalic and atraumatic.     Nose:     Comments: Masked     Mouth/Throat:     Comments: Masked  Eyes:     Extraocular Movements: Extraocular movements intact.  Cardiovascular:     Rate and Rhythm: Normal rate and regular rhythm.     Heart sounds: Normal heart sounds.  Pulmonary:     Effort: Pulmonary effort is normal.     Breath sounds: Normal breath sounds.  Musculoskeletal:        General: Tenderness present.     Cervical back: Normal range of motion.     Comments: Decreased ROM due to pain  Skin:    General: Skin is warm.  Neurological:     General: No focal deficit present.     Mental Status: She is alert.  Psychiatric:        Mood and Affect: Mood normal.        Behavior: Behavior normal.       Assessment And Plan:     1. Essential hypertension Comments: Chronic, well controlled. She will c/w olmesartan and hctz daily. Encouraged to follow low sodium diet. She will f/u in 4-6 months for re-evaluation. - CMP14+EGFR  2. Lumbar  radiculopathy Comments: No improvement w/ PT and muscle relaxers. She was given Kenalog IM x 1, advised to c/w muscle relaxer nightly and to stretch regularly. Given info for [employer]. - MR Lumbar Spine Wo Contrast;  Future - triamcinolone acetonide (KENALOG-40) injection 60 mg  3. Elevated liver function tests Comments: I will check repeat LFTs today. Encouraged to limit use of Tylenol and NSAIDs, decrease sugar intake and exercise regularly. - CMP14+EGFR  4. Insulin resistance Comments: Chronic, encouraged to follow diet low in refined carbs and to increase fiber intake. - CMP14+EGFR  5. Vitamin D deficiency disease Comments: I will check vitamin D level and supplement as needed. - Vitamin D (25 hydroxy)  6. Class 3 severe obesity due to excess calories with serious comorbidity and body mass index (BMI) of 40.0 to 44.9 in adult [facility]) Comments: She denies family h/o thyroid cancer. Will send rx Wegovy to see if insurance covers this.   Patient was given opportunity to ask questions. Patient verbalized understanding of the plan and was able to repeat key elements of the plan. All questions were answered to their satisfaction.   I, [name], MD, have reviewed all documentation for this visit. The documentation on 11/21/22 for the exam, diagnosis, procedures, and orders are all accurate and complete.   IF YOU HAVE BEEN REFERRED TO A SPECIALIST, IT MAY TAKE 1-2 WEEKS TO SCHEDULE/PROCESS THE REFERRAL. IF YOU HAVE NOT HEARD FROM US/SPECIALIST IN TWO WEEKS, PLEASE GIVE [place] A CALL AT [phone] X [phone].   THE PATIENT IS ENCOURAGED TO PRACTICE SOCIAL DISTANCING DUE TO THE COVID-19 PANDEMIC.

## 2022-11-21 NOTE — Patient Instructions (Signed)
Hypertension, Adult ?Hypertension is another name for high blood pressure. High blood pressure forces your heart to work harder to pump blood. This can cause problems over time. ?There are two numbers in a blood pressure reading. There is a top number (systolic) over a bottom number (diastolic). It is best to have a blood pressure that is below 120/80. ?What are the causes? ?The cause of this condition is not known. Some other conditions can lead to high blood pressure. ?What increases the risk? ?Some lifestyle factors can make you more likely to develop high blood pressure: ?Smoking. ?Not getting enough exercise or physical activity. ?Being overweight. ?Having too much fat, sugar, calories, or salt (sodium) in your diet. ?Drinking too much alcohol. ?Other risk factors include: ?Having any of these conditions: ?Heart disease. ?Diabetes. ?High cholesterol. ?Kidney disease. ?Obstructive sleep apnea. ?Having a family history of high blood pressure and high cholesterol. ?Age. The risk increases with age. ?Stress. ?What are the signs or symptoms? ?High blood pressure may not cause symptoms. Very high blood pressure (hypertensive crisis) may cause: ?Headache. ?Fast or uneven heartbeats (palpitations). ?Shortness of breath. ?Nosebleed. ?Vomiting or feeling like you may vomit (nauseous). ?Changes in how you see. ?Very bad chest pain. ?Feeling dizzy. ?Seizures. ?How is this treated? ?This condition is treated by making healthy lifestyle changes, such as: ?Eating healthy foods. ?Exercising more. ?Drinking less alcohol. ?Your doctor may prescribe medicine if lifestyle changes do not help enough and if: ?Your top number is above 130. ?Your bottom number is above 80. ?Your personal target blood pressure may vary. ?Follow these instructions at home: ?Eating and drinking ? ?If told, follow the DASH eating plan. To follow this plan: ?Fill one half of your plate at each meal with fruits and vegetables. ?Fill one fourth of your plate  at each meal with whole grains. Whole grains include whole-wheat pasta, brown rice, and whole-grain bread. ?Eat or drink low-fat dairy products, such as skim milk or low-fat yogurt. ?Fill one fourth of your plate at each meal with low-fat (lean) proteins. Low-fat proteins include fish, chicken without skin, eggs, beans, and tofu. ?Avoid fatty meat, cured and processed meat, or chicken with skin. ?Avoid pre-made or processed food. ?Limit the amount of salt in your diet to less than 1,500 mg each day. ?Do not drink alcohol if: ?Your doctor tells you not to drink. ?You are pregnant, may be pregnant, or are planning to become pregnant. ?If you drink alcohol: ?Limit how much you have to: ?0-1 drink a day for women. ?0-2 drinks a day for men. ?Know how much alcohol is in your drink. In the [place] one drink equals one 12 oz bottle of beer (355 mL), one 5 oz glass of wine (148 mL), or one 1? oz glass of hard liquor (44 mL). ?Lifestyle ? ?Work with your doctor to stay at a healthy weight or to lose weight. Ask your doctor what the best weight is for you. ?Get at least 30 minutes of exercise that causes your heart to beat faster (aerobic exercise) most days of the week. This may include walking, swimming, or biking. ?Get at least 30 minutes of exercise that strengthens your muscles (resistance exercise) at least 3 days a week. This may include lifting weights or doing Pilates. ?Do not smoke or use any products that contain nicotine or tobacco. If you need help quitting, ask your doctor. ?Check your blood pressure at home as told by your doctor. ?Keep all follow-up visits. ?Medicines ?Take over-the-counter and prescription medicines   only as told by your doctor. Follow directions carefully. ?Do not skip doses of blood pressure medicine. The medicine does not work as well if you skip doses. Skipping doses also puts you at risk for problems. ?Ask your doctor about side effects or reactions to medicines that you should watch  for. ?Contact a doctor if: ?You think you are having a reaction to the medicine you are taking. ?You have headaches that keep coming back. ?You feel dizzy. ?You have swelling in your ankles. ?You have trouble with your vision. ?Get help right away if: ?You get a very bad headache. ?You start to feel mixed up (confused). ?You feel weak or numb. ?You feel faint. ?You have very bad pain in your: ?Chest. ?Belly (abdomen). ?You vomit more than once. ?You have trouble breathing. ?These symptoms may be an emergency. Get help right away. Call 911. ?Do not wait to see if the symptoms will go away. ?Do not drive yourself to the hospital. ?Summary ?Hypertension is another name for high blood pressure. ?High blood pressure forces your heart to work harder to pump blood. ?For most people, a normal blood pressure is less than 120/80. ?Making healthy choices can help lower blood pressure. If your blood pressure does not get lower with healthy choices, you may need to take medicine. ?This information is not intended to replace advice given to you by your health care provider. Make sure you discuss any questions you have with your health care provider. ?Document Revised: 08/17/2021 Document Reviewed: 08/17/2021 ?Elsevier Patient Education ? 2023 Else[employer] Inc. ? ?

## 2022-11-22 ENCOUNTER — Encounter: Payer: Self-pay | Admitting: Internal Medicine

## 2022-11-22 LAB — CMP14+EGFR
ALT: 76 IU/L — ABNORMAL HIGH (ref 0–32)
AST: 53 IU/L — ABNORMAL HIGH (ref 0–40)
Albumin/Globulin Ratio: 1.6 (ref 1.2–2.2)
Albumin: 4.5 g/dL (ref 3.8–4.9)
Alkaline Phosphatase: 90 IU/L (ref 44–121)
BUN/Creatinine Ratio: 8 — ABNORMAL LOW (ref 9–23)
BUN: 7 mg/dL (ref 6–24)
Bilirubin Total: 0.3 mg/dL (ref 0.0–1.2)
CO2: 25 mmol/L (ref 20–29)
Calcium: 9.4 mg/dL (ref 8.7–10.2)
Chloride: 103 mmol/L (ref 96–106)
Creatinine, Ser: 0.88 mg/dL (ref 0.57–1.00)
Globulin, Total: 2.8 g/dL (ref 1.5–4.5)
Glucose: 80 mg/dL (ref 70–99)
Potassium: 3.8 mmol/L (ref 3.5–5.2)
Sodium: 140 mmol/L (ref 134–144)
Total Protein: 7.3 g/dL (ref 6.0–8.5)
eGFR: 79 mL/min/{1.73_m2} (ref >59)

## 2022-11-22 LAB — VITAMIN D 25 HYDROXY (VIT D DEFICIENCY, FRACTURES): Vit D, 25-Hydroxy: 19.6 ng/mL — ABNORMAL LOW (ref 30.0–100.0)

## 2022-11-24 ENCOUNTER — Encounter: Payer: Self-pay | Admitting: Internal Medicine

## 2022-11-24 ENCOUNTER — Other Ambulatory Visit: Payer: Self-pay | Admitting: Internal Medicine

## 2022-11-24 MED ORDER — SEMAGLUTIDE-WEIGHT MANAGEMENT 1 MG/0.5ML ~~LOC~~ SOAJ: 1.0000 mg | SUBCUTANEOUS | 0 refills | Status: AC

## 2022-11-26 ENCOUNTER — Telehealth: Payer: Self-pay

## 2022-11-26 NOTE — Telephone Encounter (Signed)
PA completed for wegovy

## 2022-11-27 ENCOUNTER — Encounter: Payer: Self-pay | Admitting: Internal Medicine

## 2022-11-28 DIAGNOSIS — M9903 Segmental and somatic dysfunction of lumbar region: Secondary | ICD-10-CM | POA: Diagnosis not present

## 2022-11-28 DIAGNOSIS — M62451 Contracture of muscle, right thigh: Secondary | ICD-10-CM | POA: Diagnosis not present

## 2022-11-28 DIAGNOSIS — M5431 Sciatica, right side: Secondary | ICD-10-CM | POA: Diagnosis not present

## 2022-11-28 DIAGNOSIS — M9905 Segmental and somatic dysfunction of pelvic region: Secondary | ICD-10-CM | POA: Diagnosis not present

## 2022-11-30 DIAGNOSIS — M25559 Pain in unspecified hip: Secondary | ICD-10-CM | POA: Diagnosis not present

## 2022-12-05 DIAGNOSIS — M5431 Sciatica, right side: Secondary | ICD-10-CM | POA: Diagnosis not present

## 2022-12-05 DIAGNOSIS — M9905 Segmental and somatic dysfunction of pelvic region: Secondary | ICD-10-CM | POA: Diagnosis not present

## 2022-12-05 DIAGNOSIS — M62451 Contracture of muscle, right thigh: Secondary | ICD-10-CM | POA: Diagnosis not present

## 2022-12-05 DIAGNOSIS — M9903 Segmental and somatic dysfunction of lumbar region: Secondary | ICD-10-CM | POA: Diagnosis not present

## 2022-12-14 DIAGNOSIS — M5431 Sciatica, right side: Secondary | ICD-10-CM | POA: Diagnosis not present

## 2022-12-14 DIAGNOSIS — M9903 Segmental and somatic dysfunction of lumbar region: Secondary | ICD-10-CM | POA: Diagnosis not present

## 2022-12-14 DIAGNOSIS — M9905 Segmental and somatic dysfunction of pelvic region: Secondary | ICD-10-CM | POA: Diagnosis not present

## 2022-12-14 DIAGNOSIS — M62451 Contracture of muscle, right thigh: Secondary | ICD-10-CM | POA: Diagnosis not present

## 2022-12-24 ENCOUNTER — Encounter: Payer: Self-pay | Admitting: *Deleted

## 2022-12-24 NOTE — Progress Notes (Unsigned)
PATIENT: Desiree Dunn DOB: December 29, 1970  REASON FOR VISIT: follow up HISTORY FROM: patient PRIMARY NEUROLOGIST:   Virtual Visit via Video Note  I connected with Desiree Dunn on 12/25/22 at  2:30 PM EST by a video enabled telemedicine application located remotely at [facility] and verified that I am speaking with the correct person using two identifiers who was located at their own home.   I discussed the limitations of evaluation and management by telemedicine and the availability of in person appointments. The patient expressed understanding and agreed to proceed.   PATIENT: Desiree Dunn DOB: [birth year]  REASON FOR VISIT: follow up HISTORY FROM: patient  HISTORY OF PRESENT ILLNESS: Today 12/25/22  Desiree Dunn is a 52 y.o. female with a history of OSA on CPAP. Returns today for follow-up.  She reports that the CPAP is working well for her.  She does state that she is congested and has not been able to use the CPAP in the last couple nights.  Her download is below       REVIEW OF SYSTEMS: Out of a complete 14 system review of symptoms, the patient complains only of the following symptoms, and all other reviewed systems are negative.  ALLERGIES: Allergies  Allergen Reactions   Amoxicillin Hives    Hives    HOME MEDICATIONS: Outpatient Medications Prior to Visit  Medication Sig Dispense Refill   acetaminophen (TYLENOL) 500 MG tablet Take 1,000 mg by mouth as needed for mild pain or headache.     albuterol (VENTOLIN HFA) 108 (90 Base) MCG/ACT inhaler INHALE 2 PUFFS BY MOUTH EVERY 4 HOURS AS NEEDED 18 each 1   Azelastine-Fluticasone 137-50 MCG/ACT SUSP Place 1 spray into the nose 2 (two) times daily. 23 g 5   cyclobenzaprine (FLEXERIL) 10 MG tablet TAKE 1 TABLET BY MOUTH AT BEDTIME AS NEEDED FOR MUSCLE SPASMS 20 tablet 0   EPINEPHrine (EPIPEN 2-PAK) 0.3 mg/0.3 mL IJ SOAJ injection Inject 0.3 mg into the muscle  as needed for anaphylaxis. 2 each 2   estradiol (VIVELLE-DOT) 0.05 MG/24HR patch Place 1 patch onto the skin 2 (two) times a week.     hydrochlorothiazide (MICROZIDE) 12.5 MG capsule TAKE 1 CAPSULE BY MOUTH EVERY DAY 90 capsule 3   montelukast (SINGULAIR) 10 MG tablet TAKE 1 TABLET BY MOUTH EVERYDAY AT BEDTIME 90 tablet 1   olmesartan (BENICAR) 40 MG tablet TAKE 1 TABLET BY MOUTH EVERY DAY 90 tablet 1   oxymetazoline (AFRIN NASAL SPRAY) 0.05 % nasal spray Place 2 sprays into both nostrils 2 (two) times daily. Use no more than 3-5 days at a time. 30 mL 0   [START ON 01/21/2023] Semaglutide-Weight Management 1 MG/0.5ML SOAJ Inject 1 mg into the skin once a week for 28 days. 2 mL 0   TURMERIC PO Take by mouth. AS NEEDED     verapamil (CALAN-SR) 120 MG CR tablet TAKE 1 TABLET BY MOUTH EVERY DAY 90 tablet 1   No facility-administered medications prior to visit.    PAST MEDICAL HISTORY: Past Medical History:  Diagnosis Date   Allergy    Anxiety    Back pain    Chronic RLQ pain    since 1995 when she had ectopic preg   Constipation    Deviated septum    Diverticulitis    Ectopic pregnancy 1995   x 1 , R side    Eczema    GERD (gastroesophageal reflux disease)    HTN (hypertension) 05/24/2015  Migraine without aura and without status migrainosus, not intractable    Miscarriage    x 1   Obesity, unspecified 07/06/2010   OSA (obstructive sleep apnea)    Pelvic congestion    h/o    Sleep apnea    uses cpap    PAST SURGICAL HISTORY: Past Surgical History:  Procedure Laterality Date   ABDOMINAL HYSTERECTOMY  12/2009   [facility]   BREAST REDUCTION SURGERY  10/2008   COLONOSCOPY  2001   X3 ; diverticulosis   FOOT SURGERY     NASAL SEPTOPLASTY W/ TURBINOPLASTY Bilateral 10/31/2018   Procedure: NASAL SEPTOPLASTY WITH TURBINATE REDUCTION;  Surgeon: Osborn Coho, MD;  Location: [facility] OR;  Service: ENT;  Laterality: Bilateral;   PARTIAL HYSTERECTOMY     POLYPECTOMY     HPP     REDUCTION MAMMAPLASTY Bilateral 10/2008   RIGHT OOPHORECTOMY  12/2009   [place]   TRIGGER FINGER RELEASE Right 04/28/2021   middle finger   TUBAL LIGATION  2000    FAMILY HISTORY: Family History  Problem Relation Age of Onset   Colon cancer Mother 62   Colon cancer Maternal Aunt 50   Stroke Father    Diabetes Father    Hypertension Father    Hyperlipidemia Father    Heart disease Father    Obesity Father    Hypertension Sister    Colon polyps Brother    Colitis Brother    Depression Son    Asperger's syndrome Son    Breast cancer Paternal Grandmother    Prostate cancer Brother    Heart attack Neg Hx    Esophageal cancer Neg Hx    Rectal cancer Neg Hx    Stomach cancer Neg Hx     SOCIAL HISTORY: Social History   Socioeconomic History   Marital status: Married    Spouse name: Desiree Dunn   Number of children: 2   Years of education: Not on file   Highest education level: Not on file  Occupational History   Occupation: Technical brewer, Par time   Occupation: works full time at [employer]    Employer: [employer]  Tobacco Use   Smoking status: Never   Smokeless tobacco: Never  Vaping Use   Vaping Use: Never used  Substance and Sexual Activity   Alcohol use: Yes    Alcohol/week: 0.0 standard drinks of alcohol    Comment: socially - 1 drink every 3 months   Drug use: No   Sexual activity: Yes    Partners: Male    Birth control/protection: Surgical  Other Topics Concern   Not on file  Social History Narrative   Household-- pt , husband and children   daughter 45   son 62   Social Determinants of Corporate investment banker Strain: Not on file  Food Insecurity: Not on file  Transportation Needs: Not on file  Physical Activity: Not on file  Stress: Not on file  Social Connections: Not on file  Intimate Partner Violence: Not on file      PHYSICAL EXAM Generalized: Well developed, in no acute distress   Neurological examination  Mentation:  Alert oriented to time, place, history taking. Follows all commands speech and language fluent Cranial nerve II-XII:Extraocular movements were full.  Head turning and shoulder shrug  were normal and symmetric. Motor: Good strength throughout subjectively per patient   DIAGNOSTIC DATA (LABS, IMAGING, TESTING) - I reviewed patient records, labs, notes, testing and imaging myself where available.  Lab Results  Component Value Date   WBC 6.3 05/21/2022   HGB 13.8 05/21/2022   HCT 40.7 05/21/2022   MCV 90 05/21/2022   PLT 256 05/21/2022      Component Value Date/Time   NA 140 11/21/2022 1628   K 3.8 11/21/2022 1628   CL 103 11/21/2022 1628   CO2 25 11/21/2022 1628   GLUCOSE 80 11/21/2022 1628   GLUCOSE 91 10/23/2018 0831   BUN 7 11/21/2022 1628   CREATININE 0.88 11/21/2022 1628   CALCIUM 9.4 11/21/2022 1628   PROT 7.3 11/21/2022 1628   ALBUMIN 4.5 11/21/2022 1628   AST 53 (H) 11/21/2022 1628   ALT 76 (H) 11/21/2022 1628   ALKPHOS 90 11/21/2022 1628   BILITOT 0.3 11/21/2022 1628   GFRNONAA 72 12/01/2020 1732   GFRAA 83 12/01/2020 1732   Lab Results  Component Value Date   CHOL 216 (H) 05/21/2022   HDL 66 05/21/2022   LDLCALC 121 (H) 05/21/2022   TRIG 166 (H) 05/21/2022   CHOLHDL 3.3 05/21/2022   Lab Results  Component Value Date   HGBA1C 5.6 08/21/2022   Lab Results  Component Value Date   VITAMINB12 >2000 (H) 05/21/2022   Lab Results  Component Value Date   TSH 1.150 04/28/2020      ASSESSMENT AND PLAN 52 y.o. year old female  has a past medical history of Allergy, Anxiety, Back pain, Chronic RLQ pain, Constipation, Deviated septum, Diverticulitis, Ectopic pregnancy (1995), Eczema, GERD (gastroesophageal reflux disease), HTN (hypertension) (05/24/2015), Migraine without aura and without status migrainosus, not intractable, Miscarriage, Obesity, unspecified (07/06/2010), OSA (obstructive sleep apnea), Pelvic congestion, and Sleep apnea. here with:  OSA on  CPAP  CPAP compliance excellent Residual AHI is good Encouraged patient to continue using CPAP nightly and > 4 hours each night F/U in 1 year or sooner if needed    Butch Penny, MSN, NP-C 12/25/2022, 2:34 PM [facility] [address], [address] [place], [place] [address] [phone]

## 2022-12-25 ENCOUNTER — Telehealth (INDEPENDENT_AMBULATORY_CARE_PROVIDER_SITE_OTHER): Payer: BC Managed Care – PPO | Admitting: Adult Health

## 2022-12-25 DIAGNOSIS — G4733 Obstructive sleep apnea (adult) (pediatric): Secondary | ICD-10-CM

## 2022-12-27 ENCOUNTER — Encounter: Payer: Self-pay | Admitting: Allergy

## 2023-02-04 DIAGNOSIS — M62451 Contracture of muscle, right thigh: Secondary | ICD-10-CM | POA: Diagnosis not present

## 2023-02-04 DIAGNOSIS — M9903 Segmental and somatic dysfunction of lumbar region: Secondary | ICD-10-CM | POA: Diagnosis not present

## 2023-02-04 DIAGNOSIS — M9905 Segmental and somatic dysfunction of pelvic region: Secondary | ICD-10-CM | POA: Diagnosis not present

## 2023-02-04 DIAGNOSIS — M5431 Sciatica, right side: Secondary | ICD-10-CM | POA: Diagnosis not present

## 2023-02-13 DIAGNOSIS — M9903 Segmental and somatic dysfunction of lumbar region: Secondary | ICD-10-CM | POA: Diagnosis not present

## 2023-02-13 DIAGNOSIS — M5431 Sciatica, right side: Secondary | ICD-10-CM | POA: Diagnosis not present

## 2023-02-13 DIAGNOSIS — M62451 Contracture of muscle, right thigh: Secondary | ICD-10-CM | POA: Diagnosis not present

## 2023-02-13 DIAGNOSIS — M9905 Segmental and somatic dysfunction of pelvic region: Secondary | ICD-10-CM | POA: Diagnosis not present

## 2023-02-21 DIAGNOSIS — F4323 Adjustment disorder with mixed anxiety and depressed mood: Secondary | ICD-10-CM | POA: Diagnosis not present

## 2023-03-01 DIAGNOSIS — I10 Essential (primary) hypertension: Secondary | ICD-10-CM | POA: Diagnosis not present

## 2023-03-01 DIAGNOSIS — Z90711 Acquired absence of uterus with remaining cervical stump: Secondary | ICD-10-CM | POA: Diagnosis not present

## 2023-03-01 DIAGNOSIS — J45909 Unspecified asthma, uncomplicated: Secondary | ICD-10-CM | POA: Diagnosis not present

## 2023-03-01 DIAGNOSIS — G894 Chronic pain syndrome: Secondary | ICD-10-CM | POA: Diagnosis not present

## 2023-03-03 DIAGNOSIS — E119 Type 2 diabetes mellitus without complications: Secondary | ICD-10-CM | POA: Diagnosis not present

## 2023-03-03 DIAGNOSIS — H43393 Other vitreous opacities, bilateral: Secondary | ICD-10-CM | POA: Diagnosis not present

## 2023-03-10 DIAGNOSIS — F4320 Adjustment disorder, unspecified: Secondary | ICD-10-CM | POA: Diagnosis not present

## 2023-03-10 DIAGNOSIS — F331 Major depressive disorder, recurrent, moderate: Secondary | ICD-10-CM | POA: Diagnosis not present

## 2023-03-19 ENCOUNTER — Telehealth: Payer: Self-pay

## 2023-03-19 NOTE — Telephone Encounter (Signed)
PA for Rybelsus 7mg  has been submitted through cover my meds. We are just waiting for the determination. YL,RMA

## 2023-03-30 DIAGNOSIS — F411 Generalized anxiety disorder: Secondary | ICD-10-CM | POA: Diagnosis not present

## 2023-03-30 DIAGNOSIS — F332 Major depressive disorder, recurrent severe without psychotic features: Secondary | ICD-10-CM | POA: Diagnosis not present

## 2023-04-10 ENCOUNTER — Other Ambulatory Visit: Payer: Self-pay | Admitting: Internal Medicine

## 2023-04-10 DIAGNOSIS — Z1231 Encounter for screening mammogram for malignant neoplasm of breast: Secondary | ICD-10-CM

## 2023-04-17 ENCOUNTER — Ambulatory Visit (INDEPENDENT_AMBULATORY_CARE_PROVIDER_SITE_OTHER): Payer: BC Managed Care – PPO | Admitting: Internal Medicine

## 2023-04-17 ENCOUNTER — Encounter: Payer: Self-pay | Admitting: Internal Medicine

## 2023-04-17 VITALS — BP 122/98 | HR 78 | Temp 98.3°F | Ht 64.0 in | Wt 233.2 lb

## 2023-04-17 DIAGNOSIS — I10 Essential (primary) hypertension: Secondary | ICD-10-CM

## 2023-04-17 DIAGNOSIS — H6121 Impacted cerumen, right ear: Secondary | ICD-10-CM

## 2023-04-17 DIAGNOSIS — Z6841 Body Mass Index (BMI) 40.0 and over, adult: Secondary | ICD-10-CM

## 2023-04-17 DIAGNOSIS — G44219 Episodic tension-type headache, not intractable: Secondary | ICD-10-CM | POA: Diagnosis not present

## 2023-04-17 DIAGNOSIS — R42 Dizziness and giddiness: Secondary | ICD-10-CM

## 2023-04-17 NOTE — Progress Notes (Signed)
I,Victoria T Hamilton,acting as a scribe for Gwynneth Aliment, MD.,have documented all relevant documentation on the behalf of Gwynneth Aliment, MD,as directed by  Gwynneth Aliment, MD while in the presence of Gwynneth Aliment, MD.    Subjective:     Patient ID: Desiree Dunn , female    DOB: 04-08-1971 , 52 y.o.   MRN: [id number]   Chief Complaint  Patient presents with   Dizziness   Headache    HPI  Patient presents today for evaluation of dizziness & headache. She states was cooking Sunday afternoon and suddenly felt "woozy". She finished cooking the meal.  She did take Tylenol b/c her back was also hurting. She felt "underwater". She felt like the room was spinning. She denies having recent cold/URI. She has tried dramamine without relief of her sx. Headache is described as dull, throbbing and located in the temporal area.   She reports keeping track of her blood pressure at home. She admits not having any high readings at home.  She states when lying down then getting up is when she feels most of the dizziness.    Dizziness Associated symptoms include headaches.  Headache  Associated symptoms include dizziness.     Past Medical History:  Diagnosis Date   Allergy    Anxiety    Back pain    Chronic RLQ pain    since 1995 when she had ectopic preg   Constipation    Deviated septum    Diverticulitis    Ectopic pregnancy 1995   x 1 , R side    Eczema    GERD (gastroesophageal reflux disease)    HTN (hypertension) 05/24/2015   Migraine without aura and without status migrainosus, not intractable    Miscarriage    x 1   Obesity, unspecified 07/06/2010   OSA (obstructive sleep apnea)    Pelvic congestion    h/o    Sleep apnea    uses cpap     Family History  Problem Relation Age of Onset   Colon cancer Mother 6   Colon cancer Maternal Aunt 50   Stroke Father    Diabetes Father    Hypertension Father    Hyperlipidemia Father    Heart disease Father     Obesity Father    Hypertension Sister    Colon polyps Brother    Colitis Brother    Depression Son    Asperger's syndrome Son    Breast cancer Paternal Grandmother    Prostate cancer Brother    Heart attack Neg Hx    Esophageal cancer Neg Hx    Rectal cancer Neg Hx    Stomach cancer Neg Hx      Current Outpatient Medications:    acetaminophen (TYLENOL) 500 MG tablet, Take 1,000 mg by mouth as needed for mild pain or headache., Disp: , Rfl:    albuterol (VENTOLIN HFA) 108 (90 Base) MCG/ACT inhaler, INHALE 2 PUFFS BY MOUTH EVERY 4 HOURS AS NEEDED, Disp: 18 each, Rfl: 1   Azelastine-Fluticasone 137-50 MCG/ACT SUSP, Place 1 spray into the nose 2 (two) times daily., Disp: 23 g, Rfl: 5   cyclobenzaprine (FLEXERIL) 10 MG tablet, TAKE 1 TABLET BY MOUTH AT BEDTIME AS NEEDED FOR MUSCLE SPASMS, Disp: 20 tablet, Rfl: 0   EPINEPHrine (EPIPEN 2-PAK) 0.3 mg/0.3 mL IJ SOAJ injection, Inject 0.3 mg into the muscle as needed for anaphylaxis., Disp: 2 each, Rfl: 2   hydrochlorothiazide (MICROZIDE) 12.5 MG capsule, TAKE 1 CAPSULE  BY MOUTH EVERY DAY, Disp: 90 capsule, Rfl: 3   montelukast (SINGULAIR) 10 MG tablet, TAKE 1 TABLET BY MOUTH EVERYDAY AT BEDTIME, Disp: 90 tablet, Rfl: 1   olmesartan (BENICAR) 40 MG tablet, TAKE 1 TABLET BY MOUTH EVERY DAY, Disp: 90 tablet, Rfl: 1   oxymetazoline (AFRIN NASAL SPRAY) 0.05 % nasal spray, Place 2 sprays into both nostrils 2 (two) times daily. Use no more than 3-5 days at a time., Disp: 30 mL, Rfl: 0   verapamil (CALAN-SR) 120 MG CR tablet, TAKE 1 TABLET BY MOUTH EVERY DAY, Disp: 90 tablet, Rfl: 1   Allergies  Allergen Reactions   Amoxicillin Hives    Hives     Review of Systems  Constitutional: Negative.   Respiratory: Negative.    Cardiovascular: Negative.   Gastrointestinal: Negative.   Musculoskeletal: Negative.   Skin: Negative.   Neurological:  Positive for dizziness and headaches.  Psychiatric/Behavioral: Negative.    No data found.    Today's  Vitals   04/17/23 1048  BP: (!) 122/98  Pulse: 78  Temp: 98.3 F (36.8 C)  SpO2: 98%  Weight: 233 lb 3.2 oz (105.8 kg)  Height: 5\' 4"  (1.626 m)   Wt Readings from Last 3 Encounters:  04/17/23 233 lb 3.2 oz (105.8 kg)  11/21/22 240 lb (108.9 kg)  08/21/22 233 lb (105.7 kg)    BP Readings from Last 3 Encounters:  04/17/23 (!) 122/98  11/21/22 110/78  08/21/22 124/78     Body mass index is 40.03 kg/m.  The 10-year ASCVD risk score (Arnett DK, et al., 2019) is: 6.2%   Values used to calculate the score:     Age: 24 years     Sex: Female     Is Non-Hispanic African American: Yes     Diabetic: No     Tobacco smoker: Yes     Systolic Blood Pressure: 122 mmHg     Is BP treated: Yes     HDL Cholesterol: 66 mg/dL     Total Cholesterol: 216 mg/dL  Objective:  Physical Exam Vitals and nursing note reviewed.  Constitutional:      Appearance: Normal appearance. She is well-developed.  HENT:     Head: Normocephalic and atraumatic.     Right Ear: Ear canal and external ear normal. There is impacted cerumen.     Left Ear: Tympanic membrane, ear canal and external ear normal. There is no impacted cerumen.  Eyes:     Extraocular Movements: Extraocular movements intact.  Cardiovascular:     Rate and Rhythm: Normal rate and regular rhythm.     Heart sounds: Normal heart sounds.  Pulmonary:     Effort: Pulmonary effort is normal.     Breath sounds: Normal breath sounds.  Musculoskeletal:     Cervical back: Normal range of motion.  Skin:    General: Skin is warm.  Neurological:     General: No focal deficit present.     Mental Status: She is alert.  Psychiatric:        Mood and Affect: Mood normal.        Behavior: Behavior normal.         Assessment And Plan:     1. Dizziness Comments: Sx suggest BPPV. She will use meclizine if her sx persist.  2. Episodic tension-type headache, not intractable Comments: She is advised to apply topical pain cream on temples as  needed. Encouraged to also stay well hydrated.  3. Impacted cerumen of right  ear Comments: After obtaining verbal consent, right ear was flushed by irrigation without complication. NO TM abnormalites were noted. - Ear Lavage  4. Essential hypertension Comments: Chronic, fair control. Goal BP < 120/80. She will c/w olmesartan 40mg  and hctz 12.5mg  daily. Advised to follow low sodium diet. No med changes today.  BP Readings from Last 3 Encounters:  04/17/23 (!) 122/98  11/21/22 110/78  08/21/22 124/78    5. Class 3 severe obesity due to excess calories with serious comorbidity and body mass index (BMI) of 40.0 to 44.9 in adult [facility]) She is encouraged to strive for BMI less than 30 to decrease cardiac risk. Advised to aim for at least 150 minutes of exercise per week.   Return if symptoms worsen or fail to improve.  Patient was given opportunity to ask questions. Patient verbalized understanding of the plan and was able to repeat key elements of the plan. All questions were answered to their satisfaction.   I, Gwynneth Aliment, MD, have reviewed all documentation for this visit. The documentation on 04/17/23 for the exam, diagnosis, procedures, and orders are all accurate and complete.   IF YOU HAVE BEEN REFERRED TO A SPECIALIST, IT MAY TAKE 1-2 WEEKS TO SCHEDULE/PROCESS THE REFERRAL. IF YOU HAVE NOT HEARD FROM US/SPECIALIST IN TWO WEEKS, PLEASE GIVE [place] A CALL AT [phone] X [phone].   THE PATIENT IS ENCOURAGED TO PRACTICE SOCIAL DISTANCING DUE TO THE COVID-19 PANDEMIC.

## 2023-04-17 NOTE — Patient Instructions (Signed)
Dizziness Dizziness is a common problem. It is a feeling of unsteadiness or light-headedness. You may feel like you are about to faint. Dizziness can lead to injury if you stumble or fall. Anyone can become dizzy, but dizziness is more common in older adults. This condition can be caused by a number of things, including medicines, dehydration, or illness. Follow these instructions at home: Eating and drinking  Drink enough fluid to keep your urine pale yellow. This helps to keep you from becoming dehydrated. Try to drink more clear fluids, such as water. Do not drink alcohol. Limit your caffeine intake if told to do so by your health care provider. Check ingredients and nutrition facts to see if a food or beverage contains caffeine. Limit your salt (sodium) intake if told to do so by your health care provider. Check ingredients and nutrition facts to see if a food or beverage contains sodium. Activity  Avoid making quick movements. Rise slowly from chairs and steady yourself until you feel okay. In the morning, first sit up on the side of the bed. When you feel okay, stand slowly while you hold onto something until you know that your balance is good. If you need to stand in one place for a long time, move your legs often. Tighten and relax the muscles in your legs while you are standing. Do not drive or use machinery if you feel dizzy. Avoid bending down if you feel dizzy. Place items in your home so that they are easy for you to reach without leaning over. Lifestyle Do not use any products that contain nicotine or tobacco. These products include cigarettes, chewing tobacco, and vaping devices, such as e-cigarettes. If you need help quitting, ask your health care provider. Try to reduce your stress level by using methods such as yoga or meditation. Talk with your health care provider if you need help to manage your stress. General instructions Watch your dizziness for any changes. Take  over-the-counter and prescription medicines only as told by your health care provider. Talk with your health care provider if you think that your dizziness is caused by a medicine that you are taking. Tell a friend or a family member that you are feeling dizzy. If he or she notices any changes in your behavior, have this person call your health care provider. Keep all follow-up visits. This is important. Contact a health care provider if: Your dizziness does not go away or you have new symptoms. Your dizziness or light-headedness gets worse. You feel nauseous. You have reduced hearing. You have a fever. You have neck pain or a stiff neck. Your dizziness leads to an injury or a fall. Get help right away if: You vomit or have diarrhea and are unable to eat or drink anything. You have problems talking, walking, swallowing, or using your arms, hands, or legs. You feel generally weak. You have any bleeding. You are not thinking clearly or you have trouble forming sentences. It may take a friend or family member to notice this. You have chest pain, abdominal pain, shortness of breath, or sweating. Your vision changes or you develop a severe headache. These symptoms may represent a serious problem that is an emergency. Do not wait to see if the symptoms will go away. Get medical help right away. Call your local emergency services (911 in the [place] Do not drive yourself to the hospital. Summary Dizziness is a feeling of unsteadiness or light-headedness. This condition can be caused by a number of   things, including medicines, dehydration, or illness. Anyone can become dizzy, but dizziness is more common in older adults. Drink enough fluid to keep your urine pale yellow. Do not drink alcohol. Avoid making quick movements if you feel dizzy. Monitor your dizziness for any changes. This information is not intended to replace advice given to you by your health care provider. Make sure you discuss any  questions you have with your health care provider. Document Revised: 10/03/2020 Document Reviewed: 10/03/2020 Elsevier Patient Education  2023 Elsevier Inc.  

## 2023-04-24 DIAGNOSIS — F4323 Adjustment disorder with mixed anxiety and depressed mood: Secondary | ICD-10-CM | POA: Diagnosis not present

## 2023-05-02 DIAGNOSIS — F4323 Adjustment disorder with mixed anxiety and depressed mood: Secondary | ICD-10-CM | POA: Diagnosis not present

## 2023-05-03 ENCOUNTER — Encounter: Payer: Self-pay | Admitting: *Deleted

## 2023-05-03 NOTE — Progress Notes (Signed)
[facility] Quality Team Note  Name: Desiree Dunn Date of Birth: 09-28-1971 MRN: [id number] Date: 05/03/2023  [facility] Quality Team has reviewed this patient's chart, please see recommendations below:  [facility] Quality Other; Pt has open gaps for A1C and blood pressure.  Would provider be able to order A1C at ov on 06/03/2023?

## 2023-05-08 ENCOUNTER — Encounter: Payer: Self-pay | Admitting: Internal Medicine

## 2023-05-15 ENCOUNTER — Ambulatory Visit: Payer: BC Managed Care – PPO | Admitting: Family Medicine

## 2023-05-15 ENCOUNTER — Encounter: Payer: Self-pay | Admitting: Family Medicine

## 2023-05-15 VITALS — BP 112/82 | HR 78 | Temp 98.7°F | Ht 64.0 in | Wt 236.4 lb

## 2023-05-15 DIAGNOSIS — Z6841 Body Mass Index (BMI) 40.0 and over, adult: Secondary | ICD-10-CM

## 2023-05-15 DIAGNOSIS — Z23 Encounter for immunization: Secondary | ICD-10-CM

## 2023-05-15 DIAGNOSIS — N644 Mastodynia: Secondary | ICD-10-CM | POA: Diagnosis not present

## 2023-05-15 NOTE — Progress Notes (Signed)
I,Jameka J Llittleton, CMA,acting as a Neurosurgeon for [employer], NP.,have documented all relevant documentation on the behalf of Uva Runkel, NP,as directed by  Kaelyn Nauta Moshe Salisbury, NP while in the presence of Selenne Coggin, NP.  Subjective:  Patient ID: Desiree Dunn , female    DOB: 11-29-1970 , 52 y.o.   MRN: [id number]  Chief Complaint  Patient presents with   Breast Pain    HPI  Patient presents today for Bilateral breast pain. She reports they have been feeling really sore and hurting her for awhile. She tried to schedule for a mammogram but they told her she had to come here first so we  can order a diagnostic mammogram. Patient denies any masses, lumps or discharge from breast     Past Medical History:  Diagnosis Date   Allergy    Anxiety    Back pain    Chronic RLQ pain    since 1995 when she had ectopic preg   Constipation    Deviated septum    Diverticulitis    Ectopic pregnancy 1995   x 1 , R side    Eczema    GERD (gastroesophageal reflux disease)    HTN (hypertension) 05/24/2015   Migraine without aura and without status migrainosus, not intractable    Miscarriage    x 1   Obesity, unspecified 07/06/2010   OSA (obstructive sleep apnea)    Pelvic congestion    h/o    Sleep apnea    uses cpap     Family History  Problem Relation Age of Onset   Colon cancer Mother 62   Colon cancer Maternal Aunt 50   Stroke Father    Diabetes Father    Hypertension Father    Hyperlipidemia Father    Heart disease Father    Obesity Father    Hypertension Sister    Colon polyps Brother    Colitis Brother    Depression Son    Asperger's syndrome Son    Breast cancer Paternal Grandmother    Prostate cancer Brother    Heart attack Neg Hx    Esophageal cancer Neg Hx    Rectal cancer Neg Hx    Stomach cancer Neg Hx      Current Outpatient Medications:    acetaminophen (TYLENOL) 500 MG tablet, Take 1,000 mg by mouth as needed for mild pain or headache., Disp: , Rfl:     albuterol (VENTOLIN HFA) 108 (90 Base) MCG/ACT inhaler, INHALE 2 PUFFS BY MOUTH EVERY 4 HOURS AS NEEDED, Disp: 18 each, Rfl: 1   Azelastine-Fluticasone 137-50 MCG/ACT SUSP, Place 1 spray into the nose 2 (two) times daily., Disp: 23 g, Rfl: 5   cyclobenzaprine (FLEXERIL) 10 MG tablet, TAKE 1 TABLET BY MOUTH AT BEDTIME AS NEEDED FOR MUSCLE SPASMS, Disp: 20 tablet, Rfl: 0   EPINEPHrine (EPIPEN 2-PAK) 0.3 mg/0.3 mL IJ SOAJ injection, Inject 0.3 mg into the muscle as needed for anaphylaxis., Disp: 2 each, Rfl: 2   hydrochlorothiazide (MICROZIDE) 12.5 MG capsule, TAKE 1 CAPSULE BY MOUTH EVERY DAY, Disp: 90 capsule, Rfl: 3   montelukast (SINGULAIR) 10 MG tablet, TAKE 1 TABLET BY MOUTH EVERYDAY AT BEDTIME, Disp: 90 tablet, Rfl: 1   olmesartan (BENICAR) 40 MG tablet, TAKE 1 TABLET BY MOUTH EVERY DAY, Disp: 90 tablet, Rfl: 1   oxymetazoline (AFRIN NASAL SPRAY) 0.05 % nasal spray, Place 2 sprays into both nostrils 2 (two) times daily. Use no more than 3-5 days at a time., Disp: 30 mL, Rfl:  0   verapamil (CALAN-SR) 120 MG CR tablet, TAKE 1 TABLET BY MOUTH EVERY DAY, Disp: 90 tablet, Rfl: 1   Allergies  Allergen Reactions   Amoxicillin Hives    Hives     Review of Systems  Constitutional: Negative.   HENT: Negative.    Eyes: Negative.   Cardiovascular: Negative.   Genitourinary: Negative.   Musculoskeletal: Negative.   Skin: Negative.   Psychiatric/Behavioral: Negative.       Today's Vitals   05/15/23 1105  BP: 112/82  Pulse: 78  Temp: 98.7 F (37.1 C)  Weight: 236 lb 6.4 oz (107.2 kg)  Height: 5\' 4"  (1.626 m)  PainSc: 4   PainLoc: Breast   Body mass index is 40.58 kg/m.  Wt Readings from Last 3 Encounters:  05/15/23 236 lb 6.4 oz (107.2 kg)  04/17/23 233 lb 3.2 oz (105.8 kg)  11/21/22 240 lb (108.9 kg)     Objective:  Physical Exam Constitutional:      Appearance: Normal appearance.  HENT:     Head: Normocephalic.     Nose: Nose normal.  Cardiovascular:     Rate and Rhythm:  Normal rate.  Pulmonary:     Effort: Pulmonary effort is normal. No respiratory distress.  Chest:  Breasts:    Right: Tenderness present. No mass.     Left: Tenderness present. No mass.  Skin:    General: Skin is warm and dry.  Neurological:     General: No focal deficit present.     Mental Status: She is alert and oriented to person, place, and time. Mental status is at baseline.  Psychiatric:        Mood and Affect: Mood normal.         Assessment And Plan:  Pain of both breasts -     MM 3D DIAGNOSTIC MAMMOGRAM BILATERAL BREAST; Future  Immunization due -     Tdap vaccine greater than or equal to 7yo IM  Class 3 severe obesity due to excess calories with serious comorbidity and body mass index (BMI) of 40.0 to 44.9 in adult [facility]) Assessment & Plan: She is encouraged to strive for BMI less than 30 to decrease cardiac risk. Advised to aim for at least 150 minutes of exercise per week.       Return for keep scheduled appt.  Patient was given opportunity to ask questions. Patient verbalized understanding of the plan and was able to repeat key elements of the plan. All questions were answered to their satisfaction.  Jennett Tarbell Moshe Salisbury, NP  I, Layce Sprung Moshe Salisbury, NP, have reviewed all documentation for this visit. The documentation on 05/22/23 for the exam, diagnosis, procedures, and orders are all accurate and complete.   IF YOU HAVE BEEN REFERRED TO A SPECIALIST, IT MAY TAKE 1-2 WEEKS TO SCHEDULE/PROCESS THE REFERRAL. IF YOU HAVE NOT HEARD FROM US/SPECIALIST IN TWO WEEKS, PLEASE GIVE [place] A CALL AT [phone].   THE PATIENT IS ENCOURAGED TO PRACTICE SOCIAL DISTANCING DUE TO THE COVID-19 PANDEMIC.

## 2023-05-21 ENCOUNTER — Other Ambulatory Visit: Payer: Self-pay | Admitting: Family Medicine

## 2023-05-21 DIAGNOSIS — N644 Mastodynia: Secondary | ICD-10-CM

## 2023-05-22 NOTE — Assessment & Plan Note (Signed)
She is encouraged to strive for BMI less than 30 to decrease cardiac risk. Advised to aim for at least 150 minutes of exercise per week.  

## 2023-05-23 ENCOUNTER — Ambulatory Visit
Admission: RE | Admit: 2023-05-23 | Discharge: 2023-05-23 | Disposition: A | Discharge status: Home / Self Care | Payer: BC Managed Care – PPO | Source: Ambulatory Visit | Attending: Family Medicine | Admitting: Family Medicine

## 2023-05-23 ENCOUNTER — Other Ambulatory Visit: Payer: BC Managed Care – PPO

## 2023-05-23 DIAGNOSIS — F4323 Adjustment disorder with mixed anxiety and depressed mood: Secondary | ICD-10-CM | POA: Diagnosis not present

## 2023-05-23 DIAGNOSIS — N644 Mastodynia: Secondary | ICD-10-CM | POA: Diagnosis not present

## 2023-05-23 LAB — HM MAMMOGRAPHY: HM Mammogram: NORMAL (ref 0–4)

## 2023-05-24 DIAGNOSIS — Z01419 Encounter for gynecological examination (general) (routine) without abnormal findings: Secondary | ICD-10-CM | POA: Diagnosis not present

## 2023-05-24 DIAGNOSIS — G4733 Obstructive sleep apnea (adult) (pediatric): Secondary | ICD-10-CM | POA: Diagnosis not present

## 2023-05-29 ENCOUNTER — Ambulatory Visit (INDEPENDENT_AMBULATORY_CARE_PROVIDER_SITE_OTHER): Payer: BC Managed Care – PPO | Admitting: Internal Medicine

## 2023-05-29 ENCOUNTER — Encounter: Payer: Self-pay | Admitting: Internal Medicine

## 2023-05-29 VITALS — BP 108/78 | HR 87 | Temp 98.4°F | Ht 64.0 in | Wt 236.2 lb

## 2023-05-29 DIAGNOSIS — Z0000 Encounter for general adult medical examination without abnormal findings: Secondary | ICD-10-CM

## 2023-05-29 DIAGNOSIS — F4310 Post-traumatic stress disorder, unspecified: Secondary | ICD-10-CM

## 2023-05-29 DIAGNOSIS — N951 Menopausal and female climacteric states: Secondary | ICD-10-CM

## 2023-05-29 DIAGNOSIS — I10 Essential (primary) hypertension: Secondary | ICD-10-CM

## 2023-05-29 DIAGNOSIS — E88819 Insulin resistance, unspecified: Secondary | ICD-10-CM

## 2023-05-29 LAB — POCT URINALYSIS DIPSTICK
Bilirubin, UA: NEGATIVE
Blood, UA: NEGATIVE
Glucose, UA: NEGATIVE
Ketones, UA: NEGATIVE
Leukocytes, UA: NEGATIVE
Nitrite, UA: NEGATIVE
Protein, UA: NEGATIVE
Spec Grav, UA: 1.02 (ref 1.010–1.025)
Urobilinogen, UA: 0.2 E.U./dL
pH, UA: 5.5 (ref 5.0–8.0)

## 2023-05-29 MED ORDER — CITALOPRAM HYDROBROMIDE 10 MG PO TABS: 10.0000 mg | ORAL_TABLET | Freq: Every day | ORAL | 2 refills | Status: DC

## 2023-05-29 NOTE — Patient Instructions (Signed)

## 2023-05-29 NOTE — Progress Notes (Unsigned)
I,Victoria T Deloria Lair, CMA,acting as a Neurosurgeon for Gwynneth Aliment, MD.,have documented all relevant documentation on the behalf of Gwynneth Aliment, MD,as directed by  Gwynneth Aliment, MD while in the presence of Gwynneth Aliment, MD.  Subjective:    Patient ID: Desiree Dunn , female    DOB: 1971/06/20 , 52 y.o.   MRN: [id number]  Chief Complaint  Patient presents with   Annual Exam   Hypertension    HPI  She is here today for a full physical exam. She is followed by Dr. Conni Elliot at [facility] for her pelvic exams. She is s/p hysterectomy. She reports compliance with medications. Denies headache, chest pain, and SOB. Letter sent to GYN for pap result.   Hypertension This is a chronic problem. The current episode started more than 1 year ago. The problem has been gradually improving since onset. The problem is controlled. Pertinent negatives include no blurred vision. Risk factors for coronary artery disease include obesity. Past treatments include ACE inhibitors. The current treatment provides moderate improvement. Compliance problems include exercise.      Past Medical History:  Diagnosis Date   Allergy    Anxiety    Back pain    Chronic RLQ pain    since 1995 when she had ectopic preg   Constipation    Deviated septum    Diverticulitis    Ectopic pregnancy 1995   x 1 , R side    Eczema    GERD (gastroesophageal reflux disease)    HTN (hypertension) 05/24/2015   Migraine without aura and without status migrainosus, not intractable    Miscarriage    x 1   Obesity, unspecified 07/06/2010   OSA (obstructive sleep apnea)    Pelvic congestion    h/o    Sleep apnea    uses cpap     Family History  Problem Relation Age of Onset   Colon cancer Mother 39   Colon cancer Maternal Aunt 50   Stroke Father    Diabetes Father    Hypertension Father    Hyperlipidemia Father    Heart disease Father    Obesity Father    Hypertension Sister    Colon polyps Brother     Colitis Brother    Depression Son    Asperger's syndrome Son    Breast cancer Paternal Grandmother    Prostate cancer Brother    Heart attack Neg Hx    Esophageal cancer Neg Hx    Rectal cancer Neg Hx    Stomach cancer Neg Hx      Current Outpatient Medications:    acetaminophen (TYLENOL) 500 MG tablet, Take 1,000 mg by mouth as needed for mild pain or headache., Disp: , Rfl:    albuterol (VENTOLIN HFA) 108 (90 Base) MCG/ACT inhaler, INHALE 2 PUFFS BY MOUTH EVERY 4 HOURS AS NEEDED, Disp: 18 each, Rfl: 1   Azelastine-Fluticasone 137-50 MCG/ACT SUSP, Place 1 spray into the nose 2 (two) times daily., Disp: 23 g, Rfl: 5   cyclobenzaprine (FLEXERIL) 10 MG tablet, TAKE 1 TABLET BY MOUTH AT BEDTIME AS NEEDED FOR MUSCLE SPASMS, Disp: 20 tablet, Rfl: 0   EPINEPHrine (EPIPEN 2-PAK) 0.3 mg/0.3 mL IJ SOAJ injection, Inject 0.3 mg into the muscle as needed for anaphylaxis., Disp: 2 each, Rfl: 2   hydrochlorothiazide (MICROZIDE) 12.5 MG capsule, TAKE 1 CAPSULE BY MOUTH EVERY DAY, Disp: 90 capsule, Rfl: 3   montelukast (SINGULAIR) 10 MG tablet, TAKE 1 TABLET BY MOUTH EVERYDAY AT  BEDTIME, Disp: 90 tablet, Rfl: 1   olmesartan (BENICAR) 40 MG tablet, TAKE 1 TABLET BY MOUTH EVERY DAY, Disp: 90 tablet, Rfl: 1   oxymetazoline (AFRIN NASAL SPRAY) 0.05 % nasal spray, Place 2 sprays into both nostrils 2 (two) times daily. Use no more than 3-5 days at a time., Disp: 30 mL, Rfl: 0   verapamil (CALAN-SR) 120 MG CR tablet, TAKE 1 TABLET BY MOUTH EVERY DAY, Disp: 90 tablet, Rfl: 1   Allergies  Allergen Reactions   Amoxicillin Hives    Hives      The patient states she uses status post hysterectomy for birth control. No LMP recorded. Patient has had a hysterectomy.. Negative for Dysmenorrhea. Negative for: breast discharge, breast lump(s), breast pain and breast self exam. Associated symptoms include abnormal vaginal bleeding. Pertinent negatives include abnormal bleeding (hematology), anxiety, decreased libido,  depression, difficulty falling sleep, dyspareunia, history of infertility, nocturia, sexual dysfunction, sleep disturbances, urinary incontinence, urinary urgency, vaginal discharge and vaginal itching. Diet regular.The patient states her exercise level is  moderate - walking.   . The patient's tobacco use is:  Social History   Tobacco Use  Smoking Status Never  Smokeless Tobacco Never  . She has been exposed to passive smoke. The patient's alcohol use is:  Social History   Substance and Sexual Activity  Alcohol Use Yes   Alcohol/week: 0.0 standard drinks of alcohol   Comment: socially - 1 drink every 3 months     Review of Systems  Constitutional: Negative.   HENT: Negative.    Eyes: Negative.  Negative for blurred vision.  Respiratory: Negative.    Cardiovascular: Negative.   Gastrointestinal: Negative.   Endocrine: Negative.   Genitourinary: Negative.   Musculoskeletal: Negative.   Skin: Negative.   Allergic/Immunologic: Negative.   Neurological: Negative.   Hematological: Negative.   Psychiatric/Behavioral: Negative.       Today's Vitals   05/29/23 1435  BP: 108/78  Pulse: 87  Temp: 98.4 F (36.9 C)  SpO2: 98%  Weight: 236 lb 3.2 oz (107.1 kg)  Height: 5\' 4"  (1.626 m)   Body mass index is 40.54 kg/m.  Wt Readings from Last 3 Encounters:  05/29/23 236 lb 3.2 oz (107.1 kg)  05/15/23 236 lb 6.4 oz (107.2 kg)  04/17/23 233 lb 3.2 oz (105.8 kg)     Objective:  Physical Exam Vitals and nursing note reviewed.  Constitutional:      Appearance: Normal appearance.  HENT:     Head: Normocephalic and atraumatic.     Right Ear: Tympanic membrane, ear canal and external ear normal.     Left Ear: Tympanic membrane, ear canal and external ear normal.     Nose: Nose normal.     Mouth/Throat:     Mouth: Mucous membranes are moist.     Pharynx: Oropharynx is clear.  Eyes:     Extraocular Movements: Extraocular movements intact.     Conjunctiva/sclera: Conjunctivae  normal.     Pupils: Pupils are equal, round, and reactive to light.  Cardiovascular:     Rate and Rhythm: Normal rate and regular rhythm.     Pulses: Normal pulses.     Heart sounds: Normal heart sounds.  Pulmonary:     Effort: Pulmonary effort is normal.     Breath sounds: Normal breath sounds.  Chest:  Breasts:    Tanner Score is 5.     Right: Normal.     Left: Normal.  Abdominal:     General:  Bowel sounds are normal.     Palpations: Abdomen is soft.     Comments: Rounded, soft  Genitourinary:    Comments: deferred Musculoskeletal:        General: Normal range of motion.     Cervical back: Normal range of motion and neck supple.  Skin:    General: Skin is warm and dry.  Neurological:     General: No focal deficit present.     Mental Status: She is alert and oriented to person, place, and time.  Psychiatric:        Mood and Affect: Mood normal.        Behavior: Behavior normal.         Assessment And Plan:     Encounter for general adult medical examination w/o abnormal findings  Essential hypertension -     POCT urinalysis dipstick -     Microalbumin / creatinine urine ratio -     EKG 12-Lead  Vitamin D deficiency disease  Insulin resistance     Return for 1 year HM, 6 month bpc. . Patient was given opportunity to ask questions. Patient verbalized understanding of the plan and was able to repeat key elements of the plan. All questions were answered to their satisfaction.   I, Gwynneth Aliment, MD, have reviewed all documentation for this visit. The documentation on 05/29/23 for the exam, diagnosis, procedures, and orders are all accurate and complete.

## 2023-05-30 LAB — CBC
Hematocrit: 40.1 % (ref 34.0–46.6)
Hemoglobin: 13.4 g/dL (ref 11.1–15.9)
MCH: 30 pg (ref 26.6–33.0)
MCHC: 33.4 g/dL (ref 31.5–35.7)
MCV: 90 fL (ref 79–97)
Platelets: 234 10*3/uL (ref 150–450)
RBC: 4.46 x10E6/uL (ref 3.77–5.28)
RDW: 12.9 % (ref 11.7–15.4)
WBC: 6 10*3/uL (ref 3.4–10.8)

## 2023-05-30 LAB — CMP14+EGFR
ALT: 67 IU/L — ABNORMAL HIGH (ref 0–32)
AST: 42 IU/L — ABNORMAL HIGH (ref 0–40)
Albumin: 4.6 g/dL (ref 3.8–4.9)
Alkaline Phosphatase: 79 IU/L (ref 44–121)
BUN/Creatinine Ratio: 10 (ref 9–23)
BUN: 9 mg/dL (ref 6–24)
Bilirubin Total: 0.2 mg/dL (ref 0.0–1.2)
CO2: 24 mmol/L (ref 20–29)
Calcium: 9.7 mg/dL (ref 8.7–10.2)
Chloride: 99 mmol/L (ref 96–106)
Creatinine, Ser: 0.94 mg/dL (ref 0.57–1.00)
Globulin, Total: 2.9 g/dL (ref 1.5–4.5)
Glucose: 100 mg/dL — ABNORMAL HIGH (ref 70–99)
Potassium: 3.4 mmol/L — ABNORMAL LOW (ref 3.5–5.2)
Sodium: 140 mmol/L (ref 134–144)
Total Protein: 7.5 g/dL (ref 6.0–8.5)
eGFR: 73 mL/min/{1.73_m2} (ref >59)

## 2023-05-30 LAB — TSH: TSH: 0.926 u[IU]/mL (ref 0.450–4.500)

## 2023-05-30 LAB — LIPID PANEL
Chol/HDL Ratio: 3 ratio (ref 0.0–4.4)
Cholesterol, Total: 215 mg/dL — ABNORMAL HIGH (ref 100–199)
HDL: 72 mg/dL (ref >39)
LDL Chol Calc (NIH): 102 mg/dL — ABNORMAL HIGH (ref 0–99)
Triglycerides: 246 mg/dL — ABNORMAL HIGH (ref 0–149)
VLDL Cholesterol Cal: 41 mg/dL — ABNORMAL HIGH (ref 5–40)

## 2023-05-30 LAB — HEMOGLOBIN A1C
Est. average glucose Bld gHb Est-mCnc: 123 mg/dL
Hgb A1c MFr Bld: 5.9 % — ABNORMAL HIGH (ref 4.8–5.6)

## 2023-05-30 LAB — MICROALBUMIN / CREATININE URINE RATIO
Creatinine, Urine: 132.3 mg/dL
Microalb/Creat Ratio: 3 mg/g creat (ref 0–29)
Microalbumin, Urine: 4.2 ug/mL

## 2023-06-01 ENCOUNTER — Encounter: Payer: Self-pay | Admitting: Internal Medicine

## 2023-06-03 ENCOUNTER — Other Ambulatory Visit: Payer: Self-pay | Admitting: Internal Medicine

## 2023-06-03 ENCOUNTER — Encounter: Payer: BC Managed Care – PPO | Admitting: Internal Medicine

## 2023-06-03 DIAGNOSIS — I10 Essential (primary) hypertension: Secondary | ICD-10-CM

## 2023-06-03 DIAGNOSIS — E7800 Pure hypercholesterolemia, unspecified: Secondary | ICD-10-CM

## 2023-06-03 DIAGNOSIS — E88819 Insulin resistance, unspecified: Secondary | ICD-10-CM

## 2023-06-10 ENCOUNTER — Other Ambulatory Visit: Payer: Self-pay | Admitting: Internal Medicine

## 2023-06-10 DIAGNOSIS — N951 Menopausal and female climacteric states: Secondary | ICD-10-CM | POA: Insufficient documentation

## 2023-06-10 DIAGNOSIS — F4310 Post-traumatic stress disorder, unspecified: Secondary | ICD-10-CM | POA: Insufficient documentation

## 2023-06-11 NOTE — Assessment & Plan Note (Signed)
She suffered an incredible stressor. She is encouraged to consider therapy. I will start citalopram, she will f/u in four to six weeks.

## 2023-06-11 NOTE — Assessment & Plan Note (Signed)

## 2023-06-11 NOTE — Assessment & Plan Note (Addendum)
She is advised that the citalopram started for the above issue, will also address her vasomotor symptoms associated with perimenopause.  Possible side effects were discussed with the patient. She will rto in 4-6 weeks for re-evaluation.

## 2023-06-11 NOTE — Assessment & Plan Note (Addendum)
Chronic, well controlled. EKG performed, NSR w/ low voltage in precordial leads, poor R-wave progression, and nonspecific T-abnormality.  She will c/w hydrochlorothiazide 12.5mg  daily, olmesartan 40mg  daily and verapamil 120mg  CR daily. She is encouraged to follow a low sodium diet. She will f/u in 4- 6 months for re-evaluation.

## 2023-06-13 ENCOUNTER — Ambulatory Visit (HOSPITAL_COMMUNITY)
Admission: RE | Admit: 2023-06-13 | Discharge: 2023-06-13 | Disposition: A | Discharge status: Home / Self Care | Payer: BC Managed Care – PPO | Source: Ambulatory Visit | Attending: Internal Medicine | Admitting: Internal Medicine

## 2023-06-13 DIAGNOSIS — E88819 Insulin resistance, unspecified: Secondary | ICD-10-CM | POA: Insufficient documentation

## 2023-06-13 DIAGNOSIS — I10 Essential (primary) hypertension: Secondary | ICD-10-CM | POA: Insufficient documentation

## 2023-06-13 DIAGNOSIS — E7800 Pure hypercholesterolemia, unspecified: Secondary | ICD-10-CM | POA: Insufficient documentation

## 2023-06-20 ENCOUNTER — Ambulatory Visit: Payer: BC Managed Care – PPO | Admitting: Allergy

## 2023-06-21 ENCOUNTER — Other Ambulatory Visit: Payer: Self-pay | Admitting: Internal Medicine

## 2023-06-22 ENCOUNTER — Encounter: Payer: Self-pay | Admitting: Internal Medicine

## 2023-06-24 DIAGNOSIS — G4733 Obstructive sleep apnea (adult) (pediatric): Secondary | ICD-10-CM | POA: Diagnosis not present

## 2023-07-05 NOTE — Patient Instructions (Incomplete)
Managing Anxiety, Adult After being diagnosed with anxiety, you may be relieved to know why you have felt or behaved a certain way. You may also feel overwhelmed about the treatment ahead and what it will mean for your life. With care and support, you can manage your anxiety. How to manage lifestyle changes Understanding the difference between stress and anxiety Although stress can play a role in anxiety, it is not the same as anxiety. Stress is your body's reaction to life changes and events, both good and bad. Stress is often caused by something external, such as a deadline, test, or competition. It normally goes away after the event has ended and will last just a few hours. But, stress can be ongoing and can lead to more than just stress. Anxiety is caused by something internal, such as imagining a terrible outcome or worrying that something will go wrong that will greatly upset you. Anxiety often does not go away even after the event is over, and it can become a long-term (chronic) worry. Lowering stress and anxiety Talk with your health care provider or a counselor to learn more about lowering anxiety and stress. They may suggest tension-reduction techniques, such as: Music. Spend time creating or listening to music that you enjoy and that inspires you. Mindfulness-based meditation. Practice being aware of your normal breaths while not trying to control your breathing. It can be done while sitting or walking. Centering prayer. Focus on a word, phrase, or sacred image that means something to you and brings you peace. Deep breathing. Expand your stomach and inhale slowly through your nose. Hold your breath for 3-5 seconds. Then breathe out slowly, letting your stomach muscles relax. Self-talk. Learn to notice and spot thought patterns that lead to anxiety reactions. Change those patterns to thoughts that feel peaceful. Muscle relaxation. Take time to tense muscles and then relax them. Choose a  tension-reduction technique that fits your lifestyle and personality. These techniques take time and practice. Set aside 5-15 minutes a day to do them. Specialized therapists can offer counseling and training in these techniques. The training to help with anxiety may be covered by some insurance plans. Other things you can do to manage stress and anxiety include: Keeping a stress diary. This can help you learn what triggers your reaction and then learn ways to manage your response. Thinking about how you react to certain situations. You may not be able to control everything, but you can control your response. Making time for activities that help you relax and not feeling guilty about spending your time in this way. Doing visual imagery. This involves imagining or creating mental pictures to help you relax. Practicing yoga. Through yoga poses, you can lower tension and relax.  Medicines Medicines for anxiety include: Antidepressant medicines. These are usually prescribed for long-term daily control. Anti-anxiety medicines. These may be added in severe cases, especially when panic attacks occur. When used together, medicines, psychotherapy, and tension-reduction techniques may be the most effective treatment. Relationships Relationships can play a big part in helping you recover. Spend more time connecting with trusted friends and family members. Think about going to couples counseling if you have a partner, taking family education classes, or going to family therapy. Therapy can help you and others better understand your anxiety. How to recognize changes in your anxiety Everyone responds differently to treatment for anxiety. Recovery from anxiety happens when symptoms lessen and stop interfering with your daily life at home or work. This may mean that you   will start to: Have better concentration and focus. Worry will interfere less in your daily thinking. Sleep better. Be less irritable. Have more  energy. Have improved memory. Try to recognize when your condition is getting worse. Contact your provider if your symptoms interfere with home or work and you feel like your condition is not improving. Follow these instructions at home: Activity Exercise. Adults should: Exercise for at least 150 minutes each week. The exercise should increase your heart rate and make you sweat (moderate-intensity exercise). Do strengthening exercises at least twice a week. Get the right amount and quality of sleep. Most adults need 7-9 hours of sleep each night. Lifestyle  Eat a healthy diet that includes plenty of vegetables, fruits, whole grains, low-fat dairy products, and lean protein. Do not eat a lot of foods that are high in fats, added sugars, or salt (sodium). Make choices that simplify your life. Do not use any products that contain nicotine or tobacco. These products include cigarettes, chewing tobacco, and vaping devices, such as e-cigarettes. If you need help quitting, ask your provider. Avoid caffeine, alcohol, and certain over-the-counter cold medicines. These may make you feel worse. Ask your pharmacist which medicines to avoid. General instructions Take over-the-counter and prescription medicines only as told by your provider. Keep all follow-up visits. This is to make sure you are managing your anxiety well or if you need more support. Where to find support You can get help and support from: Self-help groups. Online and community organizations. A trusted spiritual leader. Couples counseling. Family education classes. Family therapy. Where to find more information You may find that joining a support group helps you deal with your anxiety. The following sources can help you find counselors or support groups near you: Mental Health America: mentalhealthamerica.net Anxiety and Depression Association of America (ADAA): adaa.org National Alliance on Mental Illness (NAMI): nami.org Contact  a health care provider if: You have a hard time staying focused or finishing tasks. You spend many hours a day feeling worried about everyday life. You are very tired because you cannot stop worrying. You start to have headaches or often feel tense. You have chronic nausea or diarrhea. Get help right away if: Your heart feels like it is racing. You have shortness of breath. You have thoughts of hurting yourself or others. Get help right away if you feel like you may hurt yourself or others, or have thoughts about taking your own life. Go to your nearest emergency room or: Call 911. Call the National Suicide Prevention Lifeline at 1-800-273-8255 or 988. This is open 24 hours a day. Text the Crisis Text Line at 741741. This information is not intended to replace advice given to you by your health care provider. Make sure you discuss any questions you have with your health care provider. Document Revised: 08/07/2022 Document Reviewed: 02/19/2021 Elsevier Patient Education  2024 Elsevier Inc.  

## 2023-07-08 ENCOUNTER — Encounter: Payer: Self-pay | Admitting: Internal Medicine

## 2023-07-08 ENCOUNTER — Ambulatory Visit (INDEPENDENT_AMBULATORY_CARE_PROVIDER_SITE_OTHER): Payer: BC Managed Care – PPO | Admitting: Internal Medicine

## 2023-07-08 VITALS — BP 124/82 | HR 98 | Temp 98.4°F | Ht 64.0 in | Wt 233.4 lb

## 2023-07-08 DIAGNOSIS — N951 Menopausal and female climacteric states: Secondary | ICD-10-CM

## 2023-07-08 DIAGNOSIS — F4310 Post-traumatic stress disorder, unspecified: Secondary | ICD-10-CM

## 2023-07-08 DIAGNOSIS — E876 Hypokalemia: Secondary | ICD-10-CM | POA: Insufficient documentation

## 2023-07-08 DIAGNOSIS — Z6841 Body Mass Index (BMI) 40.0 and over, adult: Secondary | ICD-10-CM | POA: Diagnosis not present

## 2023-07-08 DIAGNOSIS — I10 Essential (primary) hypertension: Secondary | ICD-10-CM | POA: Diagnosis not present

## 2023-07-08 DIAGNOSIS — Z0000 Encounter for general adult medical examination without abnormal findings: Secondary | ICD-10-CM

## 2023-07-08 DIAGNOSIS — R748 Abnormal levels of other serum enzymes: Secondary | ICD-10-CM | POA: Insufficient documentation

## 2023-07-08 NOTE — Progress Notes (Signed)
I,Victoria T Deloria Lair, CMA,acting as a Neurosurgeon for Gwynneth Aliment, MD.,have documented all relevant documentation on the behalf of Gwynneth Aliment, MD,as directed by  Gwynneth Aliment, MD while in the presence of Gwynneth Aliment, MD.  Subjective:  Patient ID: Desiree Dunn , female    DOB: 1971-05-14 , 52 y.o.   MRN: [id number]  Chief Complaint  Patient presents with   Med Check    HPI  Patient presents today for Celexa follow up. She reports compliance with medication. She admits feeling a lot better while on medication. She wants to know how long she should stay on meds.      Past Medical History:  Diagnosis Date   Allergy    Anxiety    Back pain    Chronic RLQ pain    since 1995 when she had ectopic preg   Constipation    Deviated septum    Diverticulitis    Ectopic pregnancy 1995   x 1 , R side    Eczema    GERD (gastroesophageal reflux disease)    HTN (hypertension) 05/24/2015   Migraine without aura and without status migrainosus, not intractable    Miscarriage    x 1   Obesity, unspecified 07/06/2010   OSA (obstructive sleep apnea)    Pelvic congestion    h/o    Sleep apnea    uses cpap     Family History  Problem Relation Age of Onset   Colon cancer Mother 37   Colon cancer Maternal Aunt 50   Stroke Father    Diabetes Father    Hypertension Father    Hyperlipidemia Father    Heart disease Father    Obesity Father    Hypertension Sister    Colon polyps Brother    Colitis Brother    Depression Son    Asperger's syndrome Son    Breast cancer Paternal Grandmother    Prostate cancer Brother    Heart attack Neg Hx    Esophageal cancer Neg Hx    Rectal cancer Neg Hx    Stomach cancer Neg Hx      Current Outpatient Medications:    acetaminophen (TYLENOL) 500 MG tablet, Take 1,000 mg by mouth as needed for mild pain or headache., Disp: , Rfl:    albuterol (VENTOLIN HFA) 108 (90 Base) MCG/ACT inhaler, INHALE 2 PUFFS BY MOUTH EVERY 4 HOURS AS  NEEDED, Disp: 18 each, Rfl: 1   Azelastine-Fluticasone 137-50 MCG/ACT SUSP, Place 1 spray into the nose 2 (two) times daily., Disp: 23 g, Rfl: 5   citalopram (CELEXA) 10 MG tablet, TAKE 1 TABLET BY MOUTH EVERY DAY, Disp: 90 tablet, Rfl: 1   cyclobenzaprine (FLEXERIL) 10 MG tablet, TAKE 1 TABLET BY MOUTH AT BEDTIME AS NEEDED FOR MUSCLE SPASMS, Disp: 20 tablet, Rfl: 0   EPINEPHrine (EPIPEN 2-PAK) 0.3 mg/0.3 mL IJ SOAJ injection, Inject 0.3 mg into the muscle as needed for anaphylaxis., Disp: 2 each, Rfl: 2   hydrochlorothiazide (MICROZIDE) 12.5 MG capsule, TAKE 1 CAPSULE BY MOUTH EVERY DAY, Disp: 90 capsule, Rfl: 3   montelukast (SINGULAIR) 10 MG tablet, TAKE 1 TABLET BY MOUTH EVERYDAY AT BEDTIME, Disp: 90 tablet, Rfl: 1   olmesartan (BENICAR) 40 MG tablet, TAKE 1 TABLET BY MOUTH EVERY DAY, Disp: 90 tablet, Rfl: 1   oxymetazoline (AFRIN NASAL SPRAY) 0.05 % nasal spray, Place 2 sprays into both nostrils 2 (two) times daily. Use no more than 3-5 days at a time., Disp: 30  mL, Rfl: 0   verapamil (CALAN-SR) 120 MG CR tablet, TAKE 1 TABLET BY MOUTH EVERY DAY, Disp: 90 tablet, Rfl: 1   Allergies  Allergen Reactions   Amoxicillin Hives    Hives     Review of Systems  Constitutional: Negative.   Respiratory: Negative.    Cardiovascular: Negative.   Gastrointestinal: Negative.   Musculoskeletal: Negative.   Neurological: Negative.   Psychiatric/Behavioral: Negative.       Today's Vitals   07/08/23 1615  BP: 124/82  Pulse: 98  Temp: 98.4 F (36.9 C)  SpO2: 98%  Weight: 233 lb 6.4 oz (105.9 kg)  Height: 5\' 4"  (1.626 m)   Body mass index is 40.06 kg/m.  Wt Readings from Last 3 Encounters:  07/08/23 233 lb 6.4 oz (105.9 kg)  05/29/23 236 lb 3.2 oz (107.1 kg)  05/15/23 236 lb 6.4 oz (107.2 kg)     Objective:  Physical Exam Vitals and nursing note reviewed.  Constitutional:      Appearance: Normal appearance. She is obese.  HENT:     Head: Normocephalic and atraumatic.  Eyes:      Extraocular Movements: Extraocular movements intact.  Cardiovascular:     Rate and Rhythm: Normal rate and regular rhythm.     Heart sounds: Normal heart sounds.  Pulmonary:     Effort: Pulmonary effort is normal.     Breath sounds: Normal breath sounds.  Musculoskeletal:     Cervical back: Normal range of motion.  Skin:    General: Skin is warm.  Neurological:     General: No focal deficit present.     Mental Status: She is alert.  Psychiatric:        Mood and Affect: Mood normal.        Behavior: Behavior normal.         Assessment And Plan:  PTSD (post-traumatic stress disorder) Assessment & Plan: She has done well with citalopram 10mg  and therapy. She is advised to stay on therapy for at least 6 months to decrease risk of relapse;  however, one year of therapy is optimal. Will re-evaluate in 3-4 months.    Essential hypertension Assessment & Plan: Chronic, controlled. No med changes today. Reminded to follow low sodium diet.    Class 3 severe obesity due to excess calories with serious comorbidity and body mass index (BMI) of 40.0 to 44.9 in adult [facility]) Assessment & Plan: She was congratulated on 3lb weight loss since July 2024. She is encouraged to strive for BMI less than 30 to decrease cardiac risk. Advised to aim for at least 150 minutes of exercise per week.    She is encouraged to strive for BMI less than 30 to decrease cardiac risk. Advised to aim for at least 150 minutes of exercise per week.    Return in 3 months (on 10/08/2023), or Celexa f/u.  Patient was given opportunity to ask questions. Patient verbalized understanding of the plan and was able to repeat key elements of the plan. All questions were answered to their satisfaction.    I, Gwynneth Aliment, MD, have reviewed all documentation for this visit. The documentation on 07/08/23 for the exam, diagnosis, procedures, and orders are all accurate and complete.   IF YOU HAVE BEEN REFERRED TO A  SPECIALIST, IT MAY TAKE 1-2 WEEKS TO SCHEDULE/PROCESS THE REFERRAL. IF YOU HAVE NOT HEARD FROM US/SPECIALIST IN TWO WEEKS, PLEASE GIVE [place] A CALL AT [phone].   THE PATIENT IS ENCOURAGED TO  PRACTICE SOCIAL DISTANCING DUE TO THE COVID-19 PANDEMIC.

## 2023-07-15 NOTE — Assessment & Plan Note (Signed)
She was congratulated on 3lb weight loss since July 2024. She is encouraged to strive for BMI less than 30 to decrease cardiac risk. Advised to aim for at least 150 minutes of exercise per week.

## 2023-07-15 NOTE — Assessment & Plan Note (Signed)
Chronic, controlled. No med changes today. Reminded to follow low sodium diet.

## 2023-07-15 NOTE — Assessment & Plan Note (Signed)
She has done well with citalopram 10mg  and therapy. She is advised to stay on therapy for at least 6 months to decrease risk of relapse;  however, one year of therapy is optimal. Will re-evaluate in 3-4 months.

## 2023-07-19 ENCOUNTER — Encounter: Payer: Self-pay | Admitting: Adult Health

## 2023-07-19 ENCOUNTER — Encounter: Payer: Self-pay | Admitting: Internal Medicine

## 2023-07-22 NOTE — Telephone Encounter (Signed)
Noted  

## 2023-07-24 ENCOUNTER — Encounter: Payer: Self-pay | Admitting: Allergy

## 2023-07-24 ENCOUNTER — Ambulatory Visit (INDEPENDENT_AMBULATORY_CARE_PROVIDER_SITE_OTHER): Payer: Managed Care, Other (non HMO) | Admitting: Allergy

## 2023-07-24 VITALS — BP 122/78 | HR 83 | Temp 97.2°F | Resp 18 | Ht 65.2 in | Wt 234.8 lb

## 2023-07-24 DIAGNOSIS — H1013 Acute atopic conjunctivitis, bilateral: Secondary | ICD-10-CM | POA: Diagnosis not present

## 2023-07-24 DIAGNOSIS — J3089 Other allergic rhinitis: Secondary | ICD-10-CM | POA: Diagnosis not present

## 2023-07-24 DIAGNOSIS — J45990 Exercise induced bronchospasm: Secondary | ICD-10-CM

## 2023-07-24 MED ORDER — ALBUTEROL SULFATE HFA 108 (90 BASE) MCG/ACT IN AERS: INHALATION_SPRAY | RESPIRATORY_TRACT | 2 refills | Status: DC

## 2023-07-24 MED ORDER — MONTELUKAST SODIUM 10 MG PO TABS
10.0000 mg | ORAL_TABLET | Freq: Every day | ORAL | 1 refills | Status: DC
  Filled 2024-02-17: qty 45, 45d supply, fill #0

## 2023-07-24 MED ORDER — EPINEPHRINE 0.3 MG/0.3ML IJ SOAJ: 0.3000 mg | INTRAMUSCULAR | 2 refills | Status: AC | PRN

## 2023-07-24 MED ORDER — RYALTRIS 665-25 MCG/ACT NA SUSP: NASAL | 5 refills | Status: DC

## 2023-07-24 NOTE — Progress Notes (Signed)
Follow-up Note  RE: Desiree Dunn MRN: [id number] DOB: 04-28-1971 Date of Office Visit: 07/24/2023   History of present illness: Desiree Dunn is a 52 y.o. female presenting today for follow-up of allergic rhinitis with conjunctivitis and exercise induced asthma. She was last seen in the office on 01/26/22 by myself.  She has not had any major health changes, surgeries or hospitalizations since last visit.  She states ragweed is getting to her driving allergy symptoms of which can have sneezing, nasal congestion, drainage, itchy/watery eyes, sinus pressure.  She is currently alternating in the mornings between Allegra and Zyrtec and takes Xyzal in the evening. She is wondering if she should be more consistent with singulair use or not.  The dymista works well for nasal symptom control but now has new insurance and not sure if it is covered. She will use olopatadine eye drop as needed and also uses a rewetting drop as well.  Her work schedule is still not amenable to being able to be consistent with allergen immunotherapy thus holding for now.  She uses albuterol prior to exercise or activity and this typically does not have to use it again for symptom relief.  Review of systems: 10pt ROS negative unless noted above in HPI  Past medical/social/surgical/family history have been reviewed and are unchanged unless specifically indicated below.  No changes  Medication List: Current Outpatient Medications  Medication Sig Dispense Refill   acetaminophen (TYLENOL) 500 MG tablet Take 1,000 mg by mouth as needed for mild pain or headache.     Azelastine-Fluticasone 137-50 MCG/ACT SUSP Place 1 spray into the nose 2 (two) times daily. 23 g 5   citalopram (CELEXA) 10 MG tablet TAKE 1 TABLET BY MOUTH EVERY DAY 90 tablet 1   cyclobenzaprine (FLEXERIL) 10 MG tablet TAKE 1 TABLET BY MOUTH AT BEDTIME AS NEEDED FOR MUSCLE SPASMS 20 tablet 0   hydrochlorothiazide (MICROZIDE) 12.5 MG capsule  TAKE 1 CAPSULE BY MOUTH EVERY DAY 90 capsule 3   olmesartan (BENICAR) 40 MG tablet TAKE 1 TABLET BY MOUTH EVERY DAY 90 tablet 1   Olopatadine-Mometasone (RYALTRIS) 665-25 MCG/ACT SUSP Place 2 sprays into the nose in the morning and at bedtime AND 665 mcg daily. 29 g 5   oxymetazoline (AFRIN NASAL SPRAY) 0.05 % nasal spray Place 2 sprays into both nostrils 2 (two) times daily. Use no more than 3-5 days at a time. 30 mL 0   verapamil (CALAN-SR) 120 MG CR tablet TAKE 1 TABLET BY MOUTH EVERY DAY 90 tablet 1   albuterol (VENTOLIN HFA) 108 (90 Base) MCG/ACT inhaler INHALE 2 PUFFS BY MOUTH EVERY 4 HOURS AS NEEDED 18 g 2   EPINEPHrine (EPIPEN 2-PAK) 0.3 mg/0.3 mL IJ SOAJ injection Inject 0.3 mg into the muscle as needed for anaphylaxis. 2 each 2   montelukast (SINGULAIR) 10 MG tablet TAKE 1 TABLET BY MOUTH EVERYDAY AT BEDTIME 90 tablet 1   No current facility-administered medications for this visit.     Known medication allergies: Allergies  Allergen Reactions   Amoxicillin Hives    Hives     Physical examination: Blood pressure 122/78, pulse 83, temperature (!) 97.2 F (36.2 C), temperature source Temporal, resp. rate 18, height 5' 5.2" (1.656 m), weight 234 lb 12.8 oz (106.5 kg), SpO2 96%.  General: Alert, interactive, in no acute distress. HEENT: PERRLA, TMs pearly gray, turbinates non-edematous without discharge, post-pharynx non erythematous. Neck: Supple without lymphadenopathy. Lungs: Clear to auscultation without wheezing, rhonchi or rales. {no increased work  of breathing. CV: Normal S1, S2 without murmurs. Abdomen: Nondistended, nontender. Skin: Warm and dry, without lesions or rashes. Extremities:  No clubbing, cyanosis or edema. Neuro:   Grossly intact.  Diagnositics/Labs: None today  Assessment and plan: Allergic rhinitis with conjunctivitis   - continue avoidance measures for tree pollens, weed, pollens, grass pollens, molds, dust mite, dog and cockroach.  - continue  antihistamine regimen Allegra/Zyrtec in AM and Xyzal 5mg  in PM.  - continue Singulair 10mg  daily at bedtime.  Ok to take with Xyzal and take daily during fall weed pollen season for best effect.     - use Ryaltris 2 sprays each nostril twice a day as needed for runny or stuffy nose.  This will replace Dymista if covered (let us know if not covered and will try for Dymista)  - with using nasal sprays point tip of bottle toward eye on same side nostril and lean head slightly forward for best technique.    - continue use of nasal saline rinse to help flush and clean the nose.  Use prior to medicated nasal spray use.  Use only distilled water or boil water and bring to room/lukewarm temperature prior to use.   - for itchy/watery/red eyes use Pazeo 1 drop each eye daily as needed.    - use your dry eye drop or rewetting drop to help keep eyes moisturized  - if schedule allows in future you remain eligible to resume allergen immunotherapy to help control allergies and lessen your reactivity and medication needs  Exercise induced asthma  - have access to albuterol inhaler 2 puffs every 4-6 hours as needed for cough/wheeze/shortness of breath/chest tightness.  May use 15-20 minutes prior to activity.   Monitor frequency of use.     Follow-up 6-12 months or sooner if needed  I appreciate the opportunity to take part in Desiree Dunn's care. Please do not hesitate to contact me with questions.  Sincerely,   Margo Aye, MD Allergy/Immunology [facility]

## 2023-07-24 NOTE — Patient Instructions (Addendum)
Allergies  - continue avoidance measures for tree pollens, weed, pollens, grass pollens, molds, dust mite, dog and cockroach.  - continue antihistamine regimen Allegra/Zyrtec in AM and Xyzal 5mg  in PM.  - continue Singulair 10mg  daily at bedtime.  Ok to take with Xyzal and take daily during fall weed pollen season for best effect.     - use Ryaltris 2 sprays each nostril twice a day as needed for runny or stuffy nose.  This will replace Dymista if covered (let us know if not covered and will try for Dymista)  - with using nasal sprays point tip of bottle toward eye on same side nostril and lean head slightly forward for best technique.    - continue use of nasal saline rinse to help flush and clean the nose.  Use prior to medicated nasal spray use.  Use only distilled water or boil water and bring to room/lukewarm temperature prior to use.   - for itchy/watery/red eyes use Pazeo 1 drop each eye daily as needed.    - use your dry eye drop or rewetting drop to help keep eyes moisturized  - if schedule allows in future you remain eligible to resume allergen immunotherapy to help control allergies and lessen your reactivity and medication needs  Exercise induced asthma  - have access to albuterol inhaler 2 puffs every 4-6 hours as needed for cough/wheeze/shortness of breath/chest tightness.  May use 15-20 minutes prior to activity.   Monitor frequency of use.     Follow-up 6-12 months or sooner if needed

## 2023-07-25 ENCOUNTER — Telehealth: Payer: Self-pay

## 2023-07-25 ENCOUNTER — Other Ambulatory Visit: Payer: Self-pay

## 2023-07-25 DIAGNOSIS — G4733 Obstructive sleep apnea (adult) (pediatric): Secondary | ICD-10-CM | POA: Diagnosis not present

## 2023-07-25 NOTE — Telephone Encounter (Signed)
*  Asthma/Allergy  Pharmacy Patient Advocate Encounter  Received notification from [employer] that Prior Authorization for EPINEPHrine 0.3MG /0.3ML auto-injectors  has been CANCELLED due to PA not needed, pharmacy must use approved brand/NDC   PA #/Case ID/Reference #: [id number]

## 2023-07-27 ENCOUNTER — Encounter: Payer: Self-pay | Admitting: Allergy

## 2023-07-31 ENCOUNTER — Other Ambulatory Visit: Payer: Self-pay

## 2023-07-31 MED ORDER — AZELASTINE-FLUTICASONE 137-50 MCG/ACT NA SUSP
2.0000 | Freq: Every day | NASAL | 5 refills | Status: DC
  Filled 2024-01-13: qty 23, 30d supply, fill #0
  Filled 2024-02-17: qty 23, 30d supply, fill #1
  Filled 2024-03-19: qty 23, 30d supply, fill #2

## 2023-08-19 ENCOUNTER — Encounter: Payer: Self-pay | Admitting: Internal Medicine

## 2023-08-19 ENCOUNTER — Ambulatory Visit: Payer: Managed Care, Other (non HMO) | Admitting: Internal Medicine

## 2023-08-19 VITALS — BP 118/82 | HR 86 | Temp 98.3°F | Ht 65.0 in | Wt 234.2 lb

## 2023-08-19 DIAGNOSIS — Z23 Encounter for immunization: Secondary | ICD-10-CM

## 2023-08-19 DIAGNOSIS — I10 Essential (primary) hypertension: Secondary | ICD-10-CM | POA: Diagnosis not present

## 2023-08-19 DIAGNOSIS — E66812 Obesity, class 2: Secondary | ICD-10-CM

## 2023-08-19 DIAGNOSIS — Z6838 Body mass index (BMI) 38.0-38.9, adult: Secondary | ICD-10-CM | POA: Diagnosis not present

## 2023-08-19 MED ORDER — TIRZEPATIDE-WEIGHT MANAGEMENT 5 MG/0.5ML ~~LOC~~ SOLN: 5.0000 mg | SUBCUTANEOUS | 0 refills | Status: DC

## 2023-08-19 NOTE — Assessment & Plan Note (Signed)
She is interested in Zepbound for weight loss. Her insurance states it is $35 with PA. She denies family history of thyroid cancer. She is aware that we start with low dose and titrate upward monthly. She will consult with pharmacist to go over how to self-administer. We did review with demo pen as well. She is reminded to stop eating when full. Possible side effects discussed with patient in detail.  I will send 5mg  dose, I plan to titrate in four weeks. She will rto in 8 weeks for re-evaluation.

## 2023-08-19 NOTE — Assessment & Plan Note (Signed)
Chronic, controlled. No med changes today. Reminded to follow low sodium diet.

## 2023-08-19 NOTE — Patient Instructions (Signed)

## 2023-08-19 NOTE — Progress Notes (Signed)
I,Victoria T Deloria Lair, CMA,acting as a Neurosurgeon for Gwynneth Aliment, MD.,have documented all relevant documentation on the behalf of Gwynneth Aliment, MD,as directed by  Gwynneth Aliment, MD while in the presence of Gwynneth Aliment, MD.  Subjective:  Patient ID: Desiree Dunn , female    DOB: 1970-12-24 , 52 y.o.   MRN: [id number]  Chief Complaint  Patient presents with   Weight Check    HPI  Patient presents today wanting to discuss Zepbound. She would like to start medication. She admits her insurance does cover this particular medication.  She reports she is now exercising regularly and has changed her eating habits.   She admits regularly exercising.       Past Medical History:  Diagnosis Date   Allergy    Anxiety    Back pain    Chronic RLQ pain    since 1995 when she had ectopic preg   Constipation    Deviated septum    Diverticulitis    Ectopic pregnancy 1995   x 1 , R side    Eczema    GERD (gastroesophageal reflux disease)    HTN (hypertension) 05/24/2015   Migraine without aura and without status migrainosus, not intractable    Miscarriage    x 1   Obesity, unspecified 07/06/2010   OSA (obstructive sleep apnea)    Pelvic congestion    h/o    Sleep apnea    uses cpap     Family History  Problem Relation Age of Onset   Colon cancer Mother 67   Colon cancer Maternal Aunt 50   Stroke Father    Diabetes Father    Hypertension Father    Hyperlipidemia Father    Heart disease Father    Obesity Father    Hypertension Sister    Colon polyps Brother    Colitis Brother    Depression Son    Asperger's syndrome Son    Breast cancer Paternal Grandmother    Prostate cancer Brother    Heart attack Neg Hx    Esophageal cancer Neg Hx    Rectal cancer Neg Hx    Stomach cancer Neg Hx      Current Outpatient Medications:    acetaminophen (TYLENOL) 500 MG tablet, Take 1,000 mg by mouth as needed for mild pain or headache., Disp: , Rfl:    albuterol  (VENTOLIN HFA) 108 (90 Base) MCG/ACT inhaler, INHALE 2 PUFFS BY MOUTH EVERY 4 HOURS AS NEEDED, Disp: 18 g, Rfl: 2   Azelastine-Fluticasone (DYMISTA) 137-50 MCG/ACT SUSP, Place 2 sprays into both nostrils 1 day or 1 dose., Disp: 23 g, Rfl: 5   citalopram (CELEXA) 10 MG tablet, TAKE 1 TABLET BY MOUTH EVERY DAY, Disp: 90 tablet, Rfl: 1   cyclobenzaprine (FLEXERIL) 10 MG tablet, TAKE 1 TABLET BY MOUTH AT BEDTIME AS NEEDED FOR MUSCLE SPASMS, Disp: 20 tablet, Rfl: 0   EPINEPHrine (EPIPEN 2-PAK) 0.3 mg/0.3 mL IJ SOAJ injection, Inject 0.3 mg into the muscle as needed for anaphylaxis., Disp: 2 each, Rfl: 2   hydrochlorothiazide (MICROZIDE) 12.5 MG capsule, TAKE 1 CAPSULE BY MOUTH EVERY DAY, Disp: 90 capsule, Rfl: 3   montelukast (SINGULAIR) 10 MG tablet, TAKE 1 TABLET BY MOUTH EVERYDAY AT BEDTIME, Disp: 90 tablet, Rfl: 1   olmesartan (BENICAR) 40 MG tablet, TAKE 1 TABLET BY MOUTH EVERY DAY, Disp: 90 tablet, Rfl: 1   oxymetazoline (AFRIN NASAL SPRAY) 0.05 % nasal spray, Place 2 sprays into both nostrils 2 (two)  times daily. Use no more than 3-5 days at a time., Disp: 30 mL, Rfl: 0   tirzepatide 5 MG/0.5ML injection vial, Inject 5 mg into the skin once a week., Disp: 2 mL, Rfl: 0   verapamil (CALAN-SR) 120 MG CR tablet, TAKE 1 TABLET BY MOUTH EVERY DAY, Disp: 90 tablet, Rfl: 1   Allergies  Allergen Reactions   Amoxicillin Hives    Hives     Review of Systems  Constitutional: Negative.   Respiratory: Negative.    Cardiovascular: Negative.   Gastrointestinal: Negative.   Neurological: Negative.   Psychiatric/Behavioral: Negative.       Today's Vitals   08/19/23 1044  BP: 118/82  Pulse: 86  Temp: 98.3 F (36.8 C)  SpO2: 98%  Weight: 234 lb 3.2 oz (106.2 kg)  Height: 5\' 5"  (1.651 m)   Body mass index is 38.97 kg/m.  Wt Readings from Last 3 Encounters:  08/19/23 234 lb 3.2 oz (106.2 kg)  07/24/23 234 lb 12.8 oz (106.5 kg)  07/08/23 233 lb 6.4 oz (105.9 kg)    BP Readings from Last 3  Encounters:  08/19/23 118/82  07/24/23 122/78  07/08/23 124/82     Objective:  Physical Exam Vitals and nursing note reviewed.  Constitutional:      Appearance: Normal appearance.  HENT:     Head: Normocephalic and atraumatic.  Eyes:     Extraocular Movements: Extraocular movements intact.  Cardiovascular:     Rate and Rhythm: Normal rate and regular rhythm.     Heart sounds: Normal heart sounds.  Pulmonary:     Effort: Pulmonary effort is normal.     Breath sounds: Normal breath sounds.  Musculoskeletal:     Cervical back: Normal range of motion.  Skin:    General: Skin is warm.  Neurological:     General: No focal deficit present.     Mental Status: She is alert.  Psychiatric:        Mood and Affect: Mood normal.        Behavior: Behavior normal.         Assessment And Plan:  Class 2 severe obesity due to excess calories with serious comorbidity and body mass index (BMI) of 38.0 to 38.9 in adult [facility]) Assessment & Plan: She is interested in Zepbound for weight loss. Her insurance states it is $35 with [place]. She denies family history of thyroid cancer. She is aware that we start with low dose and titrate upward monthly. She will consult with pharmacist to go over how to self-administer. We did review with demo pen as well. She is reminded to stop eating when full. Possible side effects discussed with patient in detail.  I will send 5mg  dose, I plan to titrate in four weeks. She will rto in 8 weeks for re-evaluation.    Essential hypertension Assessment & Plan: Chronic, controlled. No med changes today. Reminded to follow low sodium diet.    Immunization due -     Flu vaccine trivalent PF, 6mos and older(Flulaval,Afluria,Fluarix,Fluzone)  Other orders -     Tirzepatide-Weight Management; Inject 5 mg into the skin once a week.  Dispense: 2 mL; Refill: 0  She is encouraged to strive for BMI less than 30 to decrease cardiac risk. Advised to aim for at least 150 minutes  of exercise per week.    Return if symptoms worsen or fail to improve.  Patient was given opportunity to ask questions. Patient verbalized understanding of the plan and was  able to repeat key elements of the plan. All questions were answered to their satisfaction.    I, Gwynneth Aliment, MD, have reviewed all documentation for this visit. The documentation on 08/19/23 for the exam, diagnosis, procedures, and orders are all accurate and complete.   IF YOU HAVE BEEN REFERRED TO A SPECIALIST, IT MAY TAKE 1-2 WEEKS TO SCHEDULE/PROCESS THE REFERRAL. IF YOU HAVE NOT HEARD FROM US/SPECIALIST IN TWO WEEKS, PLEASE GIVE [place] A CALL AT [phone].   THE PATIENT IS ENCOURAGED TO PRACTICE SOCIAL DISTANCING DUE TO THE COVID-19 PANDEMIC.

## 2023-08-28 ENCOUNTER — Ambulatory Visit (INDEPENDENT_AMBULATORY_CARE_PROVIDER_SITE_OTHER): Payer: Managed Care, Other (non HMO)

## 2023-08-28 ENCOUNTER — Ambulatory Visit (INDEPENDENT_AMBULATORY_CARE_PROVIDER_SITE_OTHER): Payer: Managed Care, Other (non HMO) | Admitting: Podiatry

## 2023-08-28 ENCOUNTER — Encounter: Payer: Self-pay | Admitting: Podiatry

## 2023-08-28 DIAGNOSIS — M79671 Pain in right foot: Secondary | ICD-10-CM

## 2023-08-28 MED ORDER — TRIAMCINOLONE ACETONIDE 10 MG/ML IJ SUSP
10.0000 mg | Freq: Once | INTRAMUSCULAR | Status: AC
Start: 2023-08-28 — End: 2023-08-28
  Administered 2023-08-28: 10 mg via INTRA_ARTICULAR

## 2023-08-28 NOTE — Progress Notes (Signed)
Subjective:   Patient ID: Desiree Dunn, female   DOB: 52 y.o.   MRN: [id number]   HPI Patient presents with pain in the plantar heel right states that it has been very tender and hard to walk with comfortably   ROS      Objective:  Physical Exam  Neurovascular status intact with patient noted to have exquisite discomfort right plantar fascia at the insertion of the tendon with moderate flatness of the arch noted     Assessment:  Acute plantar fasciitis right with structural changes of the arch right     Plan:  H&P reviewed condition and went ahead today did sterile prep and injected the plantar fascia right 3 mg Kenalog 5 mg Xylocaine and then went ahead and I applied fascial brace to lift up the arch and take all pressure off this plantar area.  Patient will be seen back to recheck  X-rays indicate small spur no indication stress fracture arthritis

## 2023-09-03 ENCOUNTER — Ambulatory Visit (INDEPENDENT_AMBULATORY_CARE_PROVIDER_SITE_OTHER): Payer: Managed Care, Other (non HMO) | Admitting: Internal Medicine

## 2023-09-03 VITALS — BP 110/80 | HR 82 | Temp 98.4°F | Ht 65.0 in | Wt 231.0 lb

## 2023-09-03 DIAGNOSIS — R1012 Left upper quadrant pain: Secondary | ICD-10-CM

## 2023-09-03 DIAGNOSIS — Z2821 Immunization not carried out because of patient refusal: Secondary | ICD-10-CM

## 2023-09-03 DIAGNOSIS — E66812 Obesity, class 2: Secondary | ICD-10-CM | POA: Diagnosis not present

## 2023-09-03 DIAGNOSIS — R1033 Periumbilical pain: Secondary | ICD-10-CM | POA: Diagnosis not present

## 2023-09-03 DIAGNOSIS — Z6838 Body mass index (BMI) 38.0-38.9, adult: Secondary | ICD-10-CM

## 2023-09-03 MED ORDER — PANTOPRAZOLE SODIUM 40 MG PO TBEC: 40.0000 mg | DELAYED_RELEASE_TABLET | Freq: Every day | ORAL | 1 refills | Status: DC

## 2023-09-03 NOTE — Assessment & Plan Note (Signed)
Some aspects are suggestive of gas. I will send rx pantoprazole to take once daily. I will also refer her for CT abd/pelvis for further evaluation. She is advised to go to ER if her sx worsen or if she develops n/v.

## 2023-09-03 NOTE — Progress Notes (Signed)
I,Jameka J Llittleton, CMA,acting as a Neurosurgeon for Gwynneth Aliment, MD.,have documented all relevant documentation on the behalf of Gwynneth Aliment, MD,as directed by  Gwynneth Aliment, MD while in the presence of Gwynneth Aliment, MD.  Subjective:  Patient ID: Desiree Dunn , female    DOB: 04-30-1971 , 51 y.o.   MRN: [id number]  Chief Complaint  Patient presents with   Abdominal Pain    HPI  Patient presents today for further evaluation of abdominal pain that started on Sunday. She also reports her stomach is tender to the touch. The pain is about the same. She reports the pain as dull and achy, located left upper area and extends down to her navel.  She denies eating any new foods. Denies having any n/v/d. She did not have bowel movement on Sunday, but she did have one Monday and Tuesday.  She does report h/o diverticulitis.  She has not had any change in appetite. No n/v. There is mild improvement in her symptoms with belching, bowel movement - then sx return. She has not tried any otc meds for relief.   Abdominal Pain This is a new problem. The current episode started in the past 7 days. The onset quality is gradual. The problem has been gradually worsening. The pain is located in the LUQ and periumbilical region. The pain is at a severity of 7/10. The pain is moderate. The quality of the pain is dull and aching. Pertinent negatives include no constipation, diarrhea, nausea or vomiting. The pain is aggravated by palpation and movement. The pain is relieved by Belching.     Past Medical History:  Diagnosis Date   Allergy    Anxiety    Back pain    Chronic RLQ pain    since 1995 when she had ectopic preg   Constipation    Deviated septum    Diverticulitis    Ectopic pregnancy 1995   x 1 , R side    Eczema    GERD (gastroesophageal reflux disease)    HTN (hypertension) 05/24/2015   Migraine without aura and without status migrainosus, not intractable    Miscarriage    x 1    Obesity, unspecified 07/06/2010   OSA (obstructive sleep apnea)    Pelvic congestion    h/o    Sleep apnea    uses cpap     Family History  Problem Relation Age of Onset   Colon cancer Mother 68   Colon cancer Maternal Aunt 50   Stroke Father    Diabetes Father    Hypertension Father    Hyperlipidemia Father    Heart disease Father    Obesity Father    Hypertension Sister    Colon polyps Brother    Colitis Brother    Depression Son    Asperger's syndrome Son    Breast cancer Paternal Grandmother    Prostate cancer Brother    Heart attack Neg Hx    Esophageal cancer Neg Hx    Rectal cancer Neg Hx    Stomach cancer Neg Hx      Current Outpatient Medications:    acetaminophen (TYLENOL) 500 MG tablet, Take 1,000 mg by mouth as needed for mild pain or headache., Disp: , Rfl:    albuterol (VENTOLIN HFA) 108 (90 Base) MCG/ACT inhaler, INHALE 2 PUFFS BY MOUTH EVERY 4 HOURS AS NEEDED, Disp: 18 g, Rfl: 2   Azelastine-Fluticasone (DYMISTA) 137-50 MCG/ACT SUSP, Place 2 sprays into both nostrils 1  day or 1 dose., Disp: 23 g, Rfl: 5   citalopram (CELEXA) 10 MG tablet, TAKE 1 TABLET BY MOUTH EVERY DAY, Disp: 90 tablet, Rfl: 1   cyclobenzaprine (FLEXERIL) 10 MG tablet, TAKE 1 TABLET BY MOUTH AT BEDTIME AS NEEDED FOR MUSCLE SPASMS, Disp: 20 tablet, Rfl: 0   EPINEPHrine (EPIPEN 2-PAK) 0.3 mg/0.3 mL IJ SOAJ injection, Inject 0.3 mg into the muscle as needed for anaphylaxis., Disp: 2 each, Rfl: 2   hydrochlorothiazide (MICROZIDE) 12.5 MG capsule, TAKE 1 CAPSULE BY MOUTH EVERY DAY, Disp: 90 capsule, Rfl: 3   montelukast (SINGULAIR) 10 MG tablet, TAKE 1 TABLET BY MOUTH EVERYDAY AT BEDTIME, Disp: 90 tablet, Rfl: 1   olmesartan (BENICAR) 40 MG tablet, TAKE 1 TABLET BY MOUTH EVERY DAY, Disp: 90 tablet, Rfl: 1   oxymetazoline (AFRIN NASAL SPRAY) 0.05 % nasal spray, Place 2 sprays into both nostrils 2 (two) times daily. Use no more than 3-5 days at a time., Disp: 30 mL, Rfl: 0   pantoprazole  (PROTONIX) 40 MG tablet, Take 1 tablet (40 mg total) by mouth daily., Disp: 30 tablet, Rfl: 1   tirzepatide 5 MG/0.5ML injection vial, Inject 5 mg into the skin once a week., Disp: 2 mL, Rfl: 0   verapamil (CALAN-SR) 120 MG CR tablet, TAKE 1 TABLET BY MOUTH EVERY DAY, Disp: 90 tablet, Rfl: 1   Allergies  Allergen Reactions   Amoxicillin Hives    Hives     Review of Systems  Constitutional: Negative.   HENT: Negative.    Eyes: Negative.   Respiratory: Negative.    Gastrointestinal:  Positive for abdominal pain. Negative for constipation, diarrhea, nausea and vomiting.  Musculoskeletal: Negative.   Skin: Negative.   Psychiatric/Behavioral: Negative.       Today's Vitals   09/03/23 1457  BP: 110/80  Pulse: 82  Temp: 98.4 F (36.9 C)  Weight: 231 lb (104.8 kg)  Height: 5\' 5"  (1.651 m)  PainSc: 7   PainLoc: Abdomen   Body mass index is 38.44 kg/m.  Wt Readings from Last 3 Encounters:  09/03/23 231 lb (104.8 kg)  08/19/23 234 lb 3.2 oz (106.2 kg)  07/24/23 234 lb 12.8 oz (106.5 kg)    The 10-year ASCVD risk score (Arnett DK, et al., 2019) is: 1.9%   Values used to calculate the score:     Age: 14 years     Sex: Female     Is Non-Hispanic African American: Yes     Diabetic: No     Tobacco smoker: No     Systolic Blood Pressure: 110 mmHg     Is BP treated: Yes     HDL Cholesterol: 72 mg/dL     Total Cholesterol: 215 mg/dL  Objective:  Physical Exam Vitals and nursing note reviewed.  Constitutional:      Appearance: Normal appearance. She is well-developed. She is obese.  HENT:     Head: Normocephalic and atraumatic.  Cardiovascular:     Rate and Rhythm: Normal rate and regular rhythm.     Heart sounds: Normal heart sounds.  Pulmonary:     Effort: Pulmonary effort is normal.     Breath sounds: Normal breath sounds.  Abdominal:     General: Bowel sounds are normal. There is distension.     Palpations: Abdomen is soft.     Tenderness: There is abdominal  tenderness in the periumbilical area and left upper quadrant.     Comments: No epigastric, RUQ, RLQ, LLQ tenderness.  Skin:    General: Skin is warm.  Neurological:     General: No focal deficit present.     Mental Status: She is alert.  Psychiatric:        Mood and Affect: Mood normal.        Behavior: Behavior normal.         Assessment And Plan:  Left upper quadrant abdominal pain Assessment & Plan: Some aspects are suggestive of gas. I will send rx pantoprazole to take once daily. I will also refer her for CT abd/pelvis for further evaluation. She is advised to go to ER if her sx worsen or if she develops n/v.    Orders: -     CBC -     CMP14+EGFR -     CT ABDOMEN PELVIS W CONTRAST; Future  Umbilical pain Assessment & Plan: Please see above, I will check labs as below and schedule her for CT scan for further evaluation. She is in agreement with treatment plan.   Orders: -     CBC -     CMP14+EGFR -     CT ABDOMEN PELVIS W CONTRAST; Future  Class 2 severe obesity due to excess calories with serious comorbidity and body mass index (BMI) of 38.0 to 38.9 in adult [facility]) Assessment & Plan: She was recently prescribed Zepbound for weight loss. She is still awaiting approval from her insurance. Pt advised to NOT start meds until her abdominal discomfort has resolved.    COVID-19 vaccination declined  Other orders -     Pantoprazole Sodium; Take 1 tablet (40 mg total) by mouth daily.  Dispense: 30 tablet; Refill: 1    Return if symptoms worsen or fail to improve.  Patient was given opportunity to ask questions. Patient verbalized understanding of the plan and was able to repeat key elements of the plan. All questions were answered to their satisfaction.    I, Gwynneth Aliment, MD, have reviewed all documentation for this visit. The documentation on 09/03/23 for the exam, diagnosis, procedures, and orders are all accurate and complete.   IF YOU HAVE BEEN REFERRED TO A  SPECIALIST, IT MAY TAKE 1-2 WEEKS TO SCHEDULE/PROCESS THE REFERRAL. IF YOU HAVE NOT HEARD FROM US/SPECIALIST IN TWO WEEKS, PLEASE GIVE [place] A CALL AT [phone].

## 2023-09-03 NOTE — Assessment & Plan Note (Signed)
She was recently prescribed Zepbound for weight loss. She is still awaiting approval from her insurance. Pt advised to NOT start meds until her abdominal discomfort has resolved.

## 2023-09-03 NOTE — Patient Instructions (Signed)
Abdominal Pain, Adult  Pain in the abdomen (abdominal pain) can be caused by many things. In most cases, it gets better with no treatment or by being treated at home. But in some cases, it can be serious. Your health care provider will ask questions about your medical history and do a physical exam to try to figure out what is causing your pain. Follow these instructions at home: Medicines Take over-the-counter and prescription medicines only as told by your provider. Do not take medicines that help you poop (laxatives) unless told by your provider. General instructions Watch your condition for any changes. Drink enough fluid to keep your pee (urine) pale yellow. Contact a health care provider if: Your pain changes, gets worse, or lasts longer than expected. You have severe cramping or bloating in your abdomen, or you vomit. Your pain gets worse with meals, after eating, or with certain foods. You are constipated or have diarrhea for more than 2-3 days. You are not hungry, or you lose weight without trying. You have signs of dehydration. These may include: Dark pee, very little pee, or no pee. Cracked lips or dry mouth. Sleepiness or weakness. You have pain when you pee (urinate) or poop. Your abdominal pain wakes you up at night. You have blood in your pee. You have a fever. Get help right away if: You cannot stop vomiting. Your pain is only in one part of the abdomen. Pain on the right side could be caused by appendicitis. You have bloody or black poop (stool), or poop that looks like tar. You have trouble breathing. You have chest pain. These symptoms may be an emergency. Get help right away. Call 911. Do not wait to see if the symptoms will go away. Do not drive yourself to the hospital. This information is not intended to replace advice given to you by your health care provider. Make sure you discuss any questions you have with your health care provider. Document Revised:  08/15/2022 Document Reviewed: 08/15/2022 Elsevier Patient Education  2024 [employer]  

## 2023-09-03 NOTE — Assessment & Plan Note (Signed)
Please see above, I will check labs as below and schedule her for CT scan for further evaluation. She is in agreement with treatment plan.

## 2023-09-04 LAB — CMP14+EGFR
ALT: 61 [IU]/L — ABNORMAL HIGH (ref 0–32)
AST: 41 [IU]/L — ABNORMAL HIGH (ref 0–40)
Albumin: 4.7 g/dL (ref 3.8–4.9)
Alkaline Phosphatase: 76 [IU]/L (ref 44–121)
BUN/Creatinine Ratio: 10 (ref 9–23)
BUN: 9 mg/dL (ref 6–24)
Bilirubin Total: 0.2 mg/dL (ref 0.0–1.2)
CO2: 24 mmol/L (ref 20–29)
Calcium: 9.7 mg/dL (ref 8.7–10.2)
Chloride: 101 mmol/L (ref 96–106)
Creatinine, Ser: 0.9 mg/dL (ref 0.57–1.00)
Globulin, Total: 2.6 g/dL (ref 1.5–4.5)
Glucose: 73 mg/dL (ref 70–99)
Potassium: 4.2 mmol/L (ref 3.5–5.2)
Sodium: 140 mmol/L (ref 134–144)
Total Protein: 7.3 g/dL (ref 6.0–8.5)
eGFR: 76 mL/min/{1.73_m2} (ref >59)

## 2023-09-04 LAB — CBC
Hematocrit: 39.9 % (ref 34.0–46.6)
Hemoglobin: 13.2 g/dL (ref 11.1–15.9)
MCH: 30.6 pg (ref 26.6–33.0)
MCHC: 33.1 g/dL (ref 31.5–35.7)
MCV: 93 fL (ref 79–97)
Platelets: 238 10*3/uL (ref 150–450)
RBC: 4.31 x10E6/uL (ref 3.77–5.28)
RDW: 12.8 % (ref 11.7–15.4)
WBC: 5.6 10*3/uL (ref 3.4–10.8)

## 2023-09-05 ENCOUNTER — Encounter: Payer: Self-pay | Admitting: Internal Medicine

## 2023-09-06 ENCOUNTER — Other Ambulatory Visit: Payer: Self-pay | Admitting: Internal Medicine

## 2023-09-12 ENCOUNTER — Ambulatory Visit (HOSPITAL_BASED_OUTPATIENT_CLINIC_OR_DEPARTMENT_OTHER): Payer: BC Managed Care – PPO

## 2023-09-20 ENCOUNTER — Encounter: Payer: Self-pay | Admitting: Internal Medicine

## 2023-09-20 ENCOUNTER — Other Ambulatory Visit: Payer: Self-pay | Admitting: Internal Medicine

## 2023-09-24 DIAGNOSIS — G4733 Obstructive sleep apnea (adult) (pediatric): Secondary | ICD-10-CM | POA: Diagnosis not present

## 2023-10-07 DIAGNOSIS — N760 Acute vaginitis: Secondary | ICD-10-CM | POA: Diagnosis not present

## 2023-10-07 DIAGNOSIS — N762 Acute vulvitis: Secondary | ICD-10-CM | POA: Diagnosis not present

## 2023-10-08 ENCOUNTER — Encounter: Payer: Self-pay | Admitting: Internal Medicine

## 2023-10-09 ENCOUNTER — Other Ambulatory Visit: Payer: Self-pay | Admitting: Internal Medicine

## 2023-10-09 MED ORDER — ZEPBOUND 7.5 MG/0.5ML ~~LOC~~ SOAJ: 7.5000 mg | SUBCUTANEOUS | 1 refills | Status: DC

## 2023-10-09 MED ORDER — MOUNJARO 7.5 MG/0.5ML ~~LOC~~ SOAJ: 7.5000 mg | SUBCUTANEOUS | 1 refills | Status: DC

## 2023-10-16 ENCOUNTER — Encounter: Payer: Self-pay | Admitting: Internal Medicine

## 2023-10-16 ENCOUNTER — Ambulatory Visit (INDEPENDENT_AMBULATORY_CARE_PROVIDER_SITE_OTHER): Payer: Managed Care, Other (non HMO) | Admitting: Internal Medicine

## 2023-10-16 VITALS — BP 110/82 | HR 86 | Temp 98.6°F | Ht 65.0 in | Wt 232.0 lb

## 2023-10-16 DIAGNOSIS — Z6838 Body mass index (BMI) 38.0-38.9, adult: Secondary | ICD-10-CM

## 2023-10-16 DIAGNOSIS — F4310 Post-traumatic stress disorder, unspecified: Secondary | ICD-10-CM | POA: Diagnosis not present

## 2023-10-16 DIAGNOSIS — E66812 Obesity, class 2: Secondary | ICD-10-CM | POA: Diagnosis not present

## 2023-10-16 DIAGNOSIS — I10 Essential (primary) hypertension: Secondary | ICD-10-CM

## 2023-10-16 NOTE — Progress Notes (Signed)
I,Victoria T Deloria Lair, CMA,acting as a Neurosurgeon for Gwynneth Aliment, MD.,have documented all relevant documentation on the behalf of Gwynneth Aliment, MD,as directed by  Gwynneth Aliment, MD while in the presence of Gwynneth Aliment, MD.  Subjective:  Patient ID: Desiree Dunn , female    DOB: 1971/10/11 , 52 y.o.   MRN: [id number]  Chief Complaint  Patient presents with   Med Check     HPI  Patient presents today for Celexa follow up. She reports compliance with medication. She states she is doing well with the medication.  She is also still in therapy, she feels this has been beneficial.  She would like to wean off of the medication.  Despite feeling well and admits she does not feel on edge as she has in the past.        Past Medical History:  Diagnosis Date   Allergy    Anxiety    Back pain    Chronic RLQ pain    since 1995 when she had ectopic preg   Constipation    Deviated septum    Diverticulitis    Ectopic pregnancy 1995   x 1 , R side    Eczema    GERD (gastroesophageal reflux disease)    HTN (hypertension) 05/24/2015   Migraine without aura and without status migrainosus, not intractable    Miscarriage    x 1   Obesity, unspecified 07/06/2010   OSA (obstructive sleep apnea)    Pelvic congestion    h/o    Sleep apnea    uses cpap     Family History  Problem Relation Age of Onset   Colon cancer Mother 69   Colon cancer Maternal Aunt 50   Stroke Father    Diabetes Father    Hypertension Father    Hyperlipidemia Father    Heart disease Father    Obesity Father    Hypertension Sister    Colon polyps Brother    Colitis Brother    Depression Son    Asperger's syndrome Son    Breast cancer Paternal Grandmother    Prostate cancer Brother    Heart attack Neg Hx    Esophageal cancer Neg Hx    Rectal cancer Neg Hx    Stomach cancer Neg Hx      Current Outpatient Medications:    acetaminophen (TYLENOL) 500 MG tablet, Take 1,000 mg by mouth as needed  for mild pain or headache., Disp: , Rfl:    albuterol (VENTOLIN HFA) 108 (90 Base) MCG/ACT inhaler, INHALE 2 PUFFS BY MOUTH EVERY 4 HOURS AS NEEDED, Disp: 18 g, Rfl: 2   Azelastine-Fluticasone (DYMISTA) 137-50 MCG/ACT SUSP, Place 2 sprays into both nostrils 1 day or 1 dose., Disp: 23 g, Rfl: 5   citalopram (CELEXA) 10 MG tablet, TAKE 1 TABLET BY MOUTH EVERY DAY, Disp: 90 tablet, Rfl: 1   cyclobenzaprine (FLEXERIL) 10 MG tablet, TAKE 1 TABLET BY MOUTH AT BEDTIME AS NEEDED FOR MUSCLE SPASMS, Disp: 20 tablet, Rfl: 0   EPINEPHrine (EPIPEN 2-PAK) 0.3 mg/0.3 mL IJ SOAJ injection, Inject 0.3 mg into the muscle as needed for anaphylaxis., Disp: 2 each, Rfl: 2   hydrochlorothiazide (MICROZIDE) 12.5 MG capsule, TAKE 1 CAPSULE BY MOUTH EVERY DAY, Disp: 90 capsule, Rfl: 3   montelukast (SINGULAIR) 10 MG tablet, TAKE 1 TABLET BY MOUTH EVERYDAY AT BEDTIME, Disp: 90 tablet, Rfl: 1   olmesartan (BENICAR) 40 MG tablet, TAKE 1 TABLET BY MOUTH EVERY DAY, Disp:  90 tablet, Rfl: 1   oxymetazoline (AFRIN NASAL SPRAY) 0.05 % nasal spray, Place 2 sprays into both nostrils 2 (two) times daily. Use no more than 3-5 days at a time., Disp: 30 mL, Rfl: 0   pantoprazole (PROTONIX) 40 MG tablet, Take 1 tablet (40 mg total) by mouth daily., Disp: 30 tablet, Rfl: 1   tirzepatide (ZEPBOUND) 7.5 MG/0.5ML Pen, Inject 7.5 mg into the skin once a week., Disp: 2 mL, Rfl: 1   verapamil (CALAN-SR) 120 MG CR tablet, TAKE 1 TABLET BY MOUTH EVERY DAY, Disp: 90 tablet, Rfl: 1   Allergies  Allergen Reactions   Amoxicillin Hives    Hives     Review of Systems  Constitutional: Negative.   Respiratory: Negative.    Cardiovascular: Negative.   Gastrointestinal: Negative.   Neurological: Negative.   Psychiatric/Behavioral: Negative.       Today's Vitals   10/16/23 1606  BP: 110/82  Pulse: 86  Temp: 98.6 F (37 C)  SpO2: 98%  Weight: 232 lb (105.2 kg)  Height: 5\' 5"  (1.651 m)   Body mass index is 38.61 kg/m.  Wt Readings from  Last 3 Encounters:  10/16/23 232 lb (105.2 kg)  09/03/23 231 lb (104.8 kg)  08/19/23 234 lb 3.2 oz (106.2 kg)     Objective:  Physical Exam Vitals and nursing note reviewed.  Constitutional:      Appearance: Normal appearance. She is obese.  HENT:     Head: Normocephalic and atraumatic.  Eyes:     Extraocular Movements: Extraocular movements intact.  Cardiovascular:     Rate and Rhythm: Normal rate and regular rhythm.     Heart sounds: Normal heart sounds.  Pulmonary:     Effort: Pulmonary effort is normal.     Breath sounds: Normal breath sounds.  Musculoskeletal:     Cervical back: Normal range of motion.  Skin:    General: Skin is warm.  Neurological:     General: No focal deficit present.     Mental Status: She is alert.  Psychiatric:        Mood and Affect: Mood normal.        Behavior: Behavior normal.         Assessment And Plan:  PTSD (post-traumatic stress disorder) Assessment & Plan: Sx have improved while in therapy and while taking citalopram. She would like to wean off of the medication. She will start taking 1/2 tab daily, for at least two weeks or until the medication runs out. She will let me know if she has any issues with this process.    Essential hypertension Assessment & Plan: Chronic, controlled. No med changes today. Reminded to follow low sodium diet.    Class 2 severe obesity due to excess calories with serious comorbidity and body mass index (BMI) of 38.0 to 38.9 in adult [facility]) Assessment & Plan: Chronic, she has lost two pounds since early October. She would like to stay on Zepbound 7.5mg  weekly. She will f/u in 8-10 weeks for re-evaluation.    She is encouraged to strive for BMI less than 30 to decrease cardiac risk. Advised to aim for at least 150 minutes of exercise per week.   Return in 10 weeks (on 12/25/2023), or cancel Jan appt.  Patient was given opportunity to ask questions. Patient verbalized understanding of the plan and was  able to repeat key elements of the plan. All questions were answered to their satisfaction.    I, Gwynneth Aliment, MD,  have reviewed all documentation for this visit. The documentation on 10/16/23 for the exam, diagnosis, procedures, and orders are all accurate and complete.   IF YOU HAVE BEEN REFERRED TO A SPECIALIST, IT MAY TAKE 1-2 WEEKS TO SCHEDULE/PROCESS THE REFERRAL. IF YOU HAVE NOT HEARD FROM US/SPECIALIST IN TWO WEEKS, PLEASE GIVE [place] A CALL AT [phone].   THE PATIENT IS ENCOURAGED TO PRACTICE SOCIAL DISTANCING DUE TO THE COVID-19 PANDEMIC.

## 2023-10-16 NOTE — Patient Instructions (Signed)
Exercising to Stay Healthy To become healthy and stay healthy, it is recommended that you do moderate-intensity and vigorous-intensity exercise. You can tell that you are exercising at a moderate intensity if your heart starts beating faster and you start breathing faster but can still hold a conversation. You can tell that you are exercising at a vigorous intensity if you are breathing much harder and faster and cannot hold a conversation while exercising. How can exercise benefit me? Exercising regularly is important. It has many health benefits, such as: Improving overall fitness, flexibility, and endurance. Increasing bone density. Helping with weight control. Decreasing body fat. Increasing muscle strength and endurance. Reducing stress and tension, anxiety, depression, or anger. Improving overall health. What guidelines should I follow while exercising? Before you start a new exercise program, talk with your health care provider. Do not exercise so much that you hurt yourself, feel dizzy, or get very short of breath. Wear comfortable clothes and wear shoes with good support. Drink plenty of water while you exercise to prevent dehydration or heat stroke. Work out until your breathing and your heartbeat get faster (moderate intensity). How often should I exercise? Choose an activity that you enjoy, and set realistic goals. Your health care provider can help you make an activity plan that is individually designed and works best for you. Exercise regularly as told by your health care provider. This may include: Doing strength training two times a week, such as: Lifting weights. Using resistance bands. Push-ups. Sit-ups. Yoga. Doing a certain intensity of exercise for a given amount of time. Choose from these options: A total of 150 minutes of moderate-intensity exercise every week. A total of 75 minutes of vigorous-intensity exercise every week. A mix of moderate-intensity and  vigorous-intensity exercise every week. Children, pregnant women, people who have not exercised regularly, people who are overweight, and older adults may need to talk with a health care provider about what activities are safe to perform. If you have a medical condition, be sure to talk with your health care provider before you start a new exercise program. What are some exercise ideas? Moderate-intensity exercise ideas include: Walking 1 mile (1.6 km) in about 15 minutes. Biking. Hiking. Golfing. Dancing. Water aerobics. Vigorous-intensity exercise ideas include: Walking 4.5 miles (7.2 km) or more in about 1 hour. Jogging or running 5 miles (8 km) in about 1 hour. Biking 10 miles (16.1 km) or more in about 1 hour. Lap swimming. Roller-skating or in-line skating. Cross-country skiing. Vigorous competitive sports, such as football, basketball, and soccer. Jumping rope. Aerobic dancing. What are some everyday activities that can help me get exercise? Yard work, such as: Pushing a lawn mower. Raking and bagging leaves. Washing your car. Pushing a stroller. Shoveling snow. Gardening. Washing windows or floors. How can I be more active in my day-to-day activities? Use stairs instead of an elevator. Take a walk during your lunch break. If you drive, park your car farther away from your work or school. If you take public transportation, get off one stop early and walk the rest of the way. Stand up or walk around during all of your indoor phone calls. Get up, stretch, and walk around every 30 minutes throughout the day. Enjoy exercise with a friend. Support to continue exercising will help you keep a regular routine of activity. Where to find more information You can find more information about exercising to stay healthy from: [place] Department of Health and Human Services: www.hhs.gov Centers for Disease Control and Prevention (  CDC): www.cdc.gov Summary Exercising regularly is  important. It will improve your overall fitness, flexibility, and endurance. Regular exercise will also improve your overall health. It can help you control your weight, reduce stress, and improve your bone density. Do not exercise so much that you hurt yourself, feel dizzy, or get very short of breath. Before you start a new exercise program, talk with your health care provider. This information is not intended to replace advice given to you by your health care provider. Make sure you discuss any questions you have with your health care provider. Document Revised: 02/24/2021 Document Reviewed: 02/24/2021 Elsevier Patient Education  2024 [employer]vier Inc.  

## 2023-10-20 NOTE — Assessment & Plan Note (Signed)
Chronic, she has lost two pounds since early October. She would like to stay on Zepbound 7.5mg  weekly. She will f/u in 8-10 weeks for re-evaluation.

## 2023-10-20 NOTE — Assessment & Plan Note (Signed)
Sx have improved while in therapy and while taking citalopram. She would like to wean off of the medication. She will start taking 1/2 tab daily, for at least two weeks or until the medication runs out. She will let me know if she has any issues with this process.

## 2023-10-20 NOTE — Assessment & Plan Note (Signed)
Chronic, controlled. No med changes today. Reminded to follow low sodium diet.

## 2023-10-22 ENCOUNTER — Other Ambulatory Visit: Payer: Self-pay | Admitting: Internal Medicine

## 2023-10-29 ENCOUNTER — Other Ambulatory Visit: Payer: Self-pay

## 2023-10-29 MED ORDER — ALBUTEROL SULFATE HFA 108 (90 BASE) MCG/ACT IN AERS
INHALATION_SPRAY | RESPIRATORY_TRACT | 2 refills | Status: DC
  Filled 2023-11-26: qty 6.7, 30d supply, fill #0
  Filled 2024-01-13: qty 6.7, 25d supply, fill #0
  Filled 2024-02-17: qty 6.7, 25d supply, fill #1
  Filled 2024-03-19: qty 6.7, 25d supply, fill #2

## 2023-11-16 ENCOUNTER — Other Ambulatory Visit: Payer: Self-pay | Admitting: Internal Medicine

## 2023-11-22 ENCOUNTER — Other Ambulatory Visit (HOSPITAL_COMMUNITY): Payer: Self-pay

## 2023-11-25 ENCOUNTER — Other Ambulatory Visit (HOSPITAL_COMMUNITY): Payer: Self-pay

## 2023-11-26 ENCOUNTER — Other Ambulatory Visit: Payer: Self-pay

## 2023-11-26 ENCOUNTER — Other Ambulatory Visit (HOSPITAL_COMMUNITY): Payer: Self-pay

## 2023-11-26 MED ORDER — TIRZEPATIDE-WEIGHT MANAGEMENT 2.5 MG/0.5ML ~~LOC~~ SOAJ: 2.5000 mg | SUBCUTANEOUS | 1 refills | Status: DC

## 2023-11-26 MED ORDER — TIRZEPATIDE-WEIGHT MANAGEMENT 5 MG/0.5ML ~~LOC~~ SOAJ: 5.0000 mg | SUBCUTANEOUS | 0 refills | Status: DC

## 2023-11-26 MED ORDER — ESTRADIOL 0.05 MG/24HR TD PTTW: 1.0000 | MEDICATED_PATCH | TRANSDERMAL | 0 refills | Status: DC

## 2023-11-26 MED FILL — Pantoprazole Sodium EC Tab 40 MG (Base Equiv): ORAL | 90 days supply | Qty: 90 | Fill #0 | Status: CN

## 2023-11-28 ENCOUNTER — Other Ambulatory Visit (HOSPITAL_COMMUNITY): Payer: Self-pay

## 2023-12-03 ENCOUNTER — Ambulatory Visit: Payer: BC Managed Care – PPO | Admitting: Internal Medicine

## 2023-12-05 ENCOUNTER — Other Ambulatory Visit: Payer: Self-pay | Admitting: Internal Medicine

## 2023-12-06 ENCOUNTER — Other Ambulatory Visit (HOSPITAL_COMMUNITY): Payer: Self-pay

## 2023-12-07 ENCOUNTER — Other Ambulatory Visit (HOSPITAL_COMMUNITY): Payer: Self-pay

## 2023-12-07 MED FILL — Verapamil HCl Tab ER 120 MG: ORAL | 90 days supply | Qty: 90 | Fill #0 | Status: CN

## 2023-12-07 MED FILL — Verapamil HCl Tab ER 120 MG: ORAL | 90 days supply | Qty: 90 | Fill #0 | Status: AC

## 2023-12-07 MED FILL — Olmesartan Medoxomil Tab 40 MG: ORAL | 90 days supply | Qty: 90 | Fill #0 | Status: AC

## 2023-12-07 MED FILL — Hydrochlorothiazide Cap 12.5 MG: ORAL | 90 days supply | Qty: 90 | Fill #0 | Status: AC

## 2023-12-09 ENCOUNTER — Encounter: Payer: Self-pay | Admitting: Internal Medicine

## 2023-12-09 ENCOUNTER — Other Ambulatory Visit (HOSPITAL_COMMUNITY): Payer: Self-pay

## 2023-12-10 ENCOUNTER — Other Ambulatory Visit: Payer: Self-pay

## 2023-12-10 MED ORDER — ZEPBOUND 7.5 MG/0.5ML ~~LOC~~ SOAJ: 7.5000 mg | SUBCUTANEOUS | 0 refills | Status: DC

## 2023-12-25 ENCOUNTER — Other Ambulatory Visit: Payer: Self-pay | Admitting: Internal Medicine

## 2023-12-26 ENCOUNTER — Ambulatory Visit: Payer: Managed Care, Other (non HMO) | Admitting: Internal Medicine

## 2023-12-26 ENCOUNTER — Other Ambulatory Visit (HOSPITAL_COMMUNITY): Payer: Self-pay

## 2023-12-26 ENCOUNTER — Encounter: Payer: Self-pay | Admitting: Internal Medicine

## 2023-12-26 VITALS — BP 108/74 | HR 95 | Temp 98.5°F | Ht 65.0 in | Wt 228.2 lb

## 2023-12-26 DIAGNOSIS — E66812 Obesity, class 2: Secondary | ICD-10-CM | POA: Diagnosis not present

## 2023-12-26 DIAGNOSIS — E88819 Insulin resistance, unspecified: Secondary | ICD-10-CM | POA: Diagnosis not present

## 2023-12-26 DIAGNOSIS — I10 Essential (primary) hypertension: Secondary | ICD-10-CM

## 2023-12-26 DIAGNOSIS — Z6837 Body mass index (BMI) 37.0-37.9, adult: Secondary | ICD-10-CM

## 2023-12-26 MED ORDER — ZEPBOUND 10 MG/0.5ML ~~LOC~~ SOAJ
10.0000 mg | SUBCUTANEOUS | 1 refills | Status: DC
Start: 2023-12-26 — End: 2023-12-26

## 2023-12-26 MED ORDER — ZEPBOUND 10 MG/0.5ML ~~LOC~~ SOAJ
10.0000 mg | SUBCUTANEOUS | 1 refills | Status: DC
  Filled 2023-12-26: qty 2, 28d supply, fill #0
  Filled 2024-01-13 – 2024-01-20 (×2): qty 2, 28d supply, fill #1

## 2023-12-26 NOTE — Progress Notes (Signed)
 I,Victoria T Deloria Lair, CMA,acting as a Neurosurgeon for Gwynneth Aliment, MD.,have documented all relevant documentation on the behalf of Gwynneth Aliment, MD,as directed by  Gwynneth Aliment, MD while in the presence of Gwynneth Aliment, MD.  Subjective:  Patient ID: Desiree Dunn , female    DOB: 1971/10/06 , 53 y.o.   MRN: [id number]  Chief Complaint  Patient presents with   Hypertension    HPI  Patient presents today for bpc. She reports compliance with medications. Denies headache, chest pain & sob. Patient arrived 21 minutes late to her appointment today due to work issue.   Hypertension This is a chronic problem. The current episode started more than 1 year ago. The problem has been gradually improving since onset. The problem is controlled. Pertinent negatives include no blurred vision. Risk factors for coronary artery disease include obesity and sedentary lifestyle. Past treatments include angiotensin blockers and calcium channel blockers. The current treatment provides moderate improvement.     Past Medical History:  Diagnosis Date   Allergy    Anxiety    Back pain    Chronic RLQ pain    since 1995 when she had ectopic preg   Constipation    Deviated septum    Diverticulitis    Ectopic pregnancy 1995   x 1 , R side    Eczema    GERD (gastroesophageal reflux disease)    HTN (hypertension) 05/24/2015   Migraine without aura and without status migrainosus, not intractable    Miscarriage    x 1   Obesity, unspecified 07/06/2010   OSA (obstructive sleep apnea)    Pelvic congestion    h/o    Sleep apnea    uses cpap     Family History  Problem Relation Age of Onset   Colon cancer Mother 76   Colon cancer Maternal Aunt 50   Stroke Father    Diabetes Father    Hypertension Father    Hyperlipidemia Father    Heart disease Father    Obesity Father    Hypertension Sister    Colon polyps Brother    Colitis Brother    Depression Son    Asperger's syndrome Son     Breast cancer Paternal Grandmother    Prostate cancer Brother    Heart attack Neg Hx    Esophageal cancer Neg Hx    Rectal cancer Neg Hx    Stomach cancer Neg Hx      Current Outpatient Medications:    acetaminophen (TYLENOL) 500 MG tablet, Take 1,000 mg by mouth as needed for mild pain or headache., Disp: , Rfl:    albuterol (VENTOLIN HFA) 108 (90 Base) MCG/ACT inhaler, INHALE 2 PUFFS BY MOUTH EVERY 4 HOURS AS NEEDED, Disp: 6.7 g, Rfl: 2   Azelastine-Fluticasone (DYMISTA) 137-50 MCG/ACT SUSP, Place 2 sprays into both nostrils daily., Disp: 23 g, Rfl: 5   citalopram (CELEXA) 10 MG tablet, TAKE 1 TABLET BY MOUTH EVERY DAY, Disp: 90 tablet, Rfl: 1   cyclobenzaprine (FLEXERIL) 10 MG tablet, TAKE 1 TABLET BY MOUTH AT BEDTIME AS NEEDED FOR MUSCLE SPASMS, Disp: 20 tablet, Rfl: 0   EPINEPHrine (EPIPEN 2-PAK) 0.3 mg/0.3 mL IJ SOAJ injection, Inject 0.3 mg into the muscle as needed for anaphylaxis., Disp: 2 each, Rfl: 2   hydrochlorothiazide (MICROZIDE) 12.5 MG capsule, Take 1 capsule (12.5 mg total) by mouth daily., Disp: 90 capsule, Rfl: 3   montelukast (SINGULAIR) 10 MG tablet, Take 1 tablet (10 mg total) by  mouth at bedtime., Disp: 90 tablet, Rfl: 1   olmesartan (BENICAR) 40 MG tablet, Take 1 tablet (40 mg total) by mouth daily., Disp: 90 tablet, Rfl: 1   oxymetazoline (AFRIN NASAL SPRAY) 0.05 % nasal spray, Place 2 sprays into both nostrils 2 (two) times daily. Use no more than 3-5 days at a time., Disp: 30 mL, Rfl: 0   verapamil (CALAN-SR) 120 MG CR tablet, Take 1 tablet (120 mg total) by mouth daily., Disp: 90 tablet, Rfl: 1   tirzepatide (ZEPBOUND) 10 MG/0.5ML Pen, Inject 10 mg into the skin once a week., Disp: 2 mL, Rfl: 1   Allergies  Allergen Reactions   Amoxicillin Hives    Hives     Review of Systems  Constitutional: Negative.   Eyes:  Negative for blurred vision.  Respiratory: Negative.    Cardiovascular: Negative.   Gastrointestinal: Negative.   Neurological: Negative.    Psychiatric/Behavioral: Negative.       Today's Vitals   12/26/23 1627  BP: 108/74  Pulse: 95  Temp: 98.5 F (36.9 C)  SpO2: 98%  Weight: 228 lb 3.2 oz (103.5 kg)  Height: 5\' 5"  (1.651 m)   Body mass index is 37.97 kg/m.  Wt Readings from Last 3 Encounters:  12/26/23 228 lb 3.2 oz (103.5 kg)  10/16/23 232 lb (105.2 kg)  09/03/23 231 lb (104.8 kg)     Objective:  Physical Exam Vitals and nursing note reviewed.  Constitutional:      Appearance: Normal appearance.  HENT:     Head: Normocephalic and atraumatic.  Eyes:     Extraocular Movements: Extraocular movements intact.  Cardiovascular:     Rate and Rhythm: Normal rate and regular rhythm.     Heart sounds: Normal heart sounds.  Pulmonary:     Effort: Pulmonary effort is normal.     Breath sounds: Normal breath sounds.  Musculoskeletal:     Cervical back: Normal range of motion.  Skin:    General: Skin is warm.  Neurological:     General: No focal deficit present.     Mental Status: She is alert.  Psychiatric:        Mood and Affect: Mood normal.        Behavior: Behavior normal.         Assessment And Plan:  Essential hypertension Assessment & Plan: Chronic, controlled. No med changes today. She will continue with hydrochlorothiazide 12.5mg  daily, verapamil CR daily and olmesartan 40mg  daily. She is reminded to follow low sodium diet. She will f/u in four months.   Orders: -     BMP8+EGFR  Insulin resistance -     BMP8+EGFR -     Hemoglobin A1c  Class 2 severe obesity due to excess calories with serious comorbidity and body mass index (BMI) of 37.0 to 37.9 in adult [facility]) Assessment & Plan: She was congratulated on her 4lb weight loss since December. I will increase dose of Zepbound to 10mg  weekly. She will rto in 8 weeks for re-evaluation. She is reminded to incorporate strength training into her workout routine and to prioritize her protein intake.    Other orders -     Zepbound; Inject 10  mg into the skin once a week.  Dispense: 2 mL; Refill: 1  She is encouraged to strive for BMI less than 30 to decrease cardiac risk. Advised to aim for at least 150 minutes of exercise per week.    Return in about 10 weeks (around 03/05/2024), or  weight check.  Patient was given opportunity to ask questions. Patient verbalized understanding of the plan and was able to repeat key elements of the plan. All questions were answered to their satisfaction.    I, Gwynneth Aliment, MD, have reviewed all documentation for this visit. The documentation on 12/26/23 for the exam, diagnosis, procedures, and orders are all accurate and complete.   IF YOU HAVE BEEN REFERRED TO A SPECIALIST, IT MAY TAKE 1-2 WEEKS TO SCHEDULE/PROCESS THE REFERRAL. IF YOU HAVE NOT HEARD FROM US/SPECIALIST IN TWO WEEKS, PLEASE GIVE [place] A CALL AT [phone].   THE PATIENT IS ENCOURAGED TO PRACTICE SOCIAL DISTANCING DUE TO THE COVID-19 PANDEMIC.

## 2023-12-26 NOTE — Patient Instructions (Signed)
Hypertension, Adult Hypertension is another name for high blood pressure. High blood pressure forces your heart to work harder to pump blood. This can cause problems over time. There are two numbers in a blood pressure reading. There is a top number (systolic) over a bottom number (diastolic). It is best to have a blood pressure that is below 120/80. What are the causes? The cause of this condition is not known. Some other conditions can lead to high blood pressure. What increases the risk? Some lifestyle factors can make you more likely to develop high blood pressure: Smoking. Not getting enough exercise or physical activity. Being overweight. Having too much fat, sugar, calories, or salt (sodium) in your diet. Drinking too much alcohol. Other risk factors include: Having any of these conditions: Heart disease. Diabetes. High cholesterol. Kidney disease. Obstructive sleep apnea. Having a family history of high blood pressure and high cholesterol. Age. The risk increases with age. Stress. What are the signs or symptoms? High blood pressure may not cause symptoms. Very high blood pressure (hypertensive crisis) may cause: Headache. Fast or uneven heartbeats (palpitations). Shortness of breath. Nosebleed. Vomiting or feeling like you may vomit (nauseous). Changes in how you see. Very bad chest pain. Feeling dizzy. Seizures. How is this treated? This condition is treated by making healthy lifestyle changes, such as: Eating healthy foods. Exercising more. Drinking less alcohol. Your doctor may prescribe medicine if lifestyle changes do not help enough and if: Your top number is above 130. Your bottom number is above 80. Your personal target blood pressure may vary. Follow these instructions at home: Eating and drinking  If told, follow the DASH eating plan. To follow this plan: Fill one half of your plate at each meal with fruits and vegetables. Fill one fourth of your plate  at each meal with whole grains. Whole grains include whole-wheat pasta, brown rice, and whole-grain bread. Eat or drink low-fat dairy products, such as skim milk or low-fat yogurt. Fill one fourth of your plate at each meal with low-fat (lean) proteins. Low-fat proteins include fish, chicken without skin, eggs, beans, and tofu. Avoid fatty meat, cured and processed meat, or chicken with skin. Avoid pre-made or processed food. Limit the amount of salt in your diet to less than 1,500 mg each day. Do not drink alcohol if: Your doctor tells you not to drink. You are pregnant, may be pregnant, or are planning to become pregnant. If you drink alcohol: Limit how much you have to: 0-1 drink a day for women. 0-2 drinks a day for men. Know how much alcohol is in your drink. In the [place] one drink equals one 12 oz bottle of beer (355 mL), one 5 oz glass of wine (148 mL), or one 1 oz glass of hard liquor (44 mL). Lifestyle  Work with your doctor to stay at a healthy weight or to lose weight. Ask your doctor what the best weight is for you. Get at least 30 minutes of exercise that causes your heart to beat faster (aerobic exercise) most days of the week. This may include walking, swimming, or biking. Get at least 30 minutes of exercise that strengthens your muscles (resistance exercise) at least 3 days a week. This may include lifting weights or doing Pilates. Do not smoke or use any products that contain nicotine or tobacco. If you need help quitting, ask your doctor. Check your blood pressure at home as told by your doctor. Keep all follow-up visits. Medicines Take over-the-counter and prescription medicines  only as told by your doctor. Follow directions carefully. Do not skip doses of blood pressure medicine. The medicine does not work as well if you skip doses. Skipping doses also puts you at risk for problems. Ask your doctor about side effects or reactions to medicines that you should watch  for. Contact a doctor if: You think you are having a reaction to the medicine you are taking. You have headaches that keep coming back. You feel dizzy. You have swelling in your ankles. You have trouble with your vision. Get help right away if: You get a very bad headache. You start to feel mixed up (confused). You feel weak or numb. You feel faint. You have very bad pain in your: Chest. Belly (abdomen). You vomit more than once. You have trouble breathing. These symptoms may be an emergency. Get help right away. Call 911. Do not wait to see if the symptoms will go away. Do not drive yourself to the hospital. Summary Hypertension is another name for high blood pressure. High blood pressure forces your heart to work harder to pump blood. For most people, a normal blood pressure is less than 120/80. Making healthy choices can help lower blood pressure. If your blood pressure does not get lower with healthy choices, you may need to take medicine. This information is not intended to replace advice given to you by your health care provider. Make sure you discuss any questions you have with your health care provider. Document Revised: 08/17/2021 Document Reviewed: 08/17/2021 Elsevier Patient Education  2024 [employer].

## 2023-12-27 LAB — BMP8+EGFR
BUN/Creatinine Ratio: 10 (ref 9–23)
BUN: 9 mg/dL (ref 6–24)
CO2: 24 mmol/L (ref 20–29)
Calcium: 9.6 mg/dL (ref 8.7–10.2)
Chloride: 103 mmol/L (ref 96–106)
Creatinine, Ser: 0.94 mg/dL (ref 0.57–1.00)
Glucose: 98 mg/dL (ref 70–99)
Potassium: 3.7 mmol/L (ref 3.5–5.2)
Sodium: 142 mmol/L (ref 134–144)
eGFR: 72 mL/min/{1.73_m2} (ref >59)

## 2023-12-27 LAB — HEMOGLOBIN A1C
Est. average glucose Bld gHb Est-mCnc: 117 mg/dL
Hgb A1c MFr Bld: 5.7 % — ABNORMAL HIGH (ref 4.8–5.6)

## 2023-12-28 ENCOUNTER — Other Ambulatory Visit (HOSPITAL_COMMUNITY): Payer: Self-pay

## 2023-12-29 NOTE — Assessment & Plan Note (Signed)
 Chronic, controlled. No med changes today. She will continue with hydrochlorothiazide 12.5mg  daily, verapamil CR daily and olmesartan 40mg  daily. She is reminded to follow low sodium diet. She will f/u in four months.

## 2023-12-29 NOTE — Assessment & Plan Note (Signed)
 She was congratulated on her 4lb weight loss since December. I will increase dose of Zepbound to 10mg  weekly. She will rto in 8 weeks for re-evaluation. She is reminded to incorporate strength training into her workout routine and to prioritize her protein intake.

## 2023-12-31 ENCOUNTER — Telehealth (INDEPENDENT_AMBULATORY_CARE_PROVIDER_SITE_OTHER): Payer: Managed Care, Other (non HMO) | Admitting: Adult Health

## 2023-12-31 DIAGNOSIS — G4733 Obstructive sleep apnea (adult) (pediatric): Secondary | ICD-10-CM | POA: Diagnosis not present

## 2023-12-31 NOTE — Patient Instructions (Signed)
 Your Plan:  Mask refitting ordered.  Will try DreamWear full facemask.  If this is not beneficial we will consider dental device     Thank you for coming to see [place] at [facility]. I hope we have been able to provide you high quality care today.  You may receive a patient satisfaction survey over the next few weeks. We would appreciate your feedback and comments so that we may continue to improve ourselves and the health of our patients.

## 2023-12-31 NOTE — Progress Notes (Signed)
 PATIENT: Desiree Dunn DOB: 03-19-1971  REASON FOR VISIT: follow up HISTORY FROM: patient PRIMARY NEUROLOGIST:   Virtual Visit via Video Note  I connected with Desiree Dunn on 12/31/23 at  2:15 PM EST by a video enabled telemedicine application located remotely at [facility] and verified that I am speaking with the correct person using two identifiers who was located at their job in [place].    I discussed the limitations of evaluation and management by telemedicine and the availability of in person appointments. The patient expressed understanding and agreed to proceed.   PATIENT: Desiree Dunn DOB: [birth year]  REASON FOR VISIT: follow up HISTORY FROM: patient  HISTORY OF PRESENT ILLNESS: Today 12/31/23  Desiree Dunn is a 53 y.o. female with a history of OSA on CPAP. Returns today for follow-up.  Reports that she has not been using the CPAP as much due to nasal congestion.  She currently has a nasal mask.  She states when she feels congested she is unable to use it.  She is asking about a dental device.  Her download is below.     12/25/22: Desiree Dunn is a 53 y.o. female with a history of OSA on CPAP. Returns today for follow-up.  She reports that the CPAP is working well for her.  She does state that she is congested and has not been able to use the CPAP in the last couple nights.  Her download is below       REVIEW OF SYSTEMS: Out of a complete 14 system review of symptoms, the patient complains only of the following symptoms, and all other reviewed systems are negative.  ALLERGIES: Allergies  Allergen Reactions   Amoxicillin Hives    Hives    HOME MEDICATIONS: Outpatient Medications Prior to Visit  Medication Sig Dispense Refill   acetaminophen (TYLENOL) 500 MG tablet Take 1,000 mg by mouth as needed for mild pain or headache.     albuterol (VENTOLIN HFA) 108 (90 Base) MCG/ACT inhaler INHALE 2  PUFFS BY MOUTH EVERY 4 HOURS AS NEEDED 6.7 g 2   Azelastine-Fluticasone (DYMISTA) 137-50 MCG/ACT SUSP Place 2 sprays into both nostrils daily. 23 g 5   citalopram (CELEXA) 10 MG tablet TAKE 1 TABLET BY MOUTH EVERY DAY 90 tablet 1   cyclobenzaprine (FLEXERIL) 10 MG tablet TAKE 1 TABLET BY MOUTH AT BEDTIME AS NEEDED FOR MUSCLE SPASMS 20 tablet 0   EPINEPHrine (EPIPEN 2-PAK) 0.3 mg/0.3 mL IJ SOAJ injection Inject 0.3 mg into the muscle as needed for anaphylaxis. 2 each 2   hydrochlorothiazide (MICROZIDE) 12.5 MG capsule Take 1 capsule (12.5 mg total) by mouth daily. 90 capsule 3   montelukast (SINGULAIR) 10 MG tablet Take 1 tablet (10 mg total) by mouth at bedtime. 90 tablet 1   olmesartan (BENICAR) 40 MG tablet Take 1 tablet (40 mg total) by mouth daily. 90 tablet 1   oxymetazoline (AFRIN NASAL SPRAY) 0.05 % nasal spray Place 2 sprays into both nostrils 2 (two) times daily. Use no more than 3-5 days at a time. 30 mL 0   tirzepatide (ZEPBOUND) 10 MG/0.5ML Pen Inject 10 mg into the skin once a week. 2 mL 1   verapamil (CALAN-SR) 120 MG CR tablet Take 1 tablet (120 mg total) by mouth daily. 90 tablet 1   No facility-administered medications prior to visit.    PAST MEDICAL HISTORY: Past Medical History:  Diagnosis Date   Allergy    Anxiety    Back pain  Chronic RLQ pain    since 1995 when she had ectopic preg   Constipation    Deviated septum    Diverticulitis    Ectopic pregnancy 1995   x 1 , R side    Eczema    GERD (gastroesophageal reflux disease)    HTN (hypertension) 05/24/2015   Migraine without aura and without status migrainosus, not intractable    Miscarriage    x 1   Obesity, unspecified 07/06/2010   OSA (obstructive sleep apnea)    Pelvic congestion    h/o    Sleep apnea    uses cpap    PAST SURGICAL HISTORY: Past Surgical History:  Procedure Laterality Date   ABDOMINAL HYSTERECTOMY  12/2009   [place]   BREAST REDUCTION SURGERY  10/2008   COLONOSCOPY  2001    X3 ; diverticulosis   FOOT SURGERY     NASAL SEPTOPLASTY W/ TURBINOPLASTY Bilateral 10/31/2018   Procedure: NASAL SEPTOPLASTY WITH TURBINATE REDUCTION;  Surgeon: Osborn Coho, MD;  Location: [facility] OR;  Service: ENT;  Laterality: Bilateral;   PARTIAL HYSTERECTOMY     POLYPECTOMY     HPP    REDUCTION MAMMAPLASTY Bilateral 10/2008   RIGHT OOPHORECTOMY  12/2009   [place]   TRIGGER FINGER RELEASE Right 04/28/2021   middle finger   TUBAL LIGATION  2000    FAMILY HISTORY: Family History  Problem Relation Age of Onset   Colon cancer Mother 25   Colon cancer Maternal Aunt 50   Stroke Father    Diabetes Father    Hypertension Father    Hyperlipidemia Father    Heart disease Father    Obesity Father    Hypertension Sister    Colon polyps Brother    Colitis Brother    Depression Son    Asperger's syndrome Son    Breast cancer Paternal Grandmother    Prostate cancer Brother    Heart attack Neg Hx    Esophageal cancer Neg Hx    Rectal cancer Neg Hx    Stomach cancer Neg Hx     SOCIAL HISTORY: Social History   Socioeconomic History   Marital status: Married    Spouse name: Charles   Number of children: 2   Years of education: Not on file   Highest education level: Master's degree (e.g., MA, MS, MEng, MEd, MSW, MBA)  Occupational History   Occupation: Technical brewer, Par time   Occupation: works full time at [employer]    Employer: [employer]  Tobacco Use   Smoking status: Never   Smokeless tobacco: Never  Vaping Use   Vaping status: Never Used  Substance and Sexual Activity   Alcohol use: Yes    Alcohol/week: 0.0 standard drinks of alcohol    Comment: socially - 1 drink every 3 months   Drug use: No   Sexual activity: Yes    Partners: Male    Birth control/protection: Surgical  Other Topics Concern   Not on file  Social History Narrative   Household-- pt , husband and children   daughter 52   son 92   Social Drivers of Corporate investment banker  Strain: Low Risk  (09/03/2023)   Overall Financial Resource Strain (CARDIA)    Difficulty of Paying Living Expenses: Not hard at all  Food Insecurity: No Food Insecurity (09/03/2023)   Hunger Vital Sign    Worried About Running Out of Food in the Last Year: Never true    Ran Out of Food  in the Last Year: Never true  Transportation Needs: No Transportation Needs (09/03/2023)   PRAPARE - Administrator, Civil Service (Medical): No    Lack of Transportation (Non-Medical): No  Physical Activity: Insufficiently Active (09/03/2023)   Exercise Vital Sign    Days of Exercise per Week: 3 days    Minutes of Exercise per Session: 30 min  Stress: No Stress Concern Present (09/03/2023)   [employer] of Occupational Health - Occupational Stress Questionnaire    Feeling of Stress : Only a little  Social Connections: Moderately Isolated (09/03/2023)   Social Connection and Isolation Panel [NHANES]    Frequency of Communication with Friends and Family: Once a week    Frequency of Social Gatherings with Friends and Family: Once a week    Attends Religious Services: Never    Database administrator or Organizations: Yes    Attends Banker Meetings: Never    Marital Status: Married  Catering manager Violence: Unknown (11/30/2022)   Received from [employer], [facility]   HITS    Physically Hurt: Not on file    Insult or Talk Down To: Not on file    Threaten Physical Harm: Not on file    Scream or Curse: Not on file      PHYSICAL EXAM Generalized: Well developed, in no acute distress   Neurological examination  Mentation: Alert oriented to time, place, history taking. Follows all commands speech and language fluent Cranial nerve II-XII:Extraocular movements were full.  Head turning and shoulder shrug  were normal and symmetric. Motor: Good strength throughout subjectively per patient   DIAGNOSTIC DATA (LABS, IMAGING, TESTING) - I reviewed patient records,  labs, notes, testing and imaging myself where available.  Lab Results  Component Value Date   WBC 5.6 09/03/2023   HGB 13.2 09/03/2023   HCT 39.9 09/03/2023   MCV 93 09/03/2023   PLT 238 09/03/2023      Component Value Date/Time   NA 142 12/26/2023 1708   K 3.7 12/26/2023 1708   CL 103 12/26/2023 1708   CO2 24 12/26/2023 1708   GLUCOSE 98 12/26/2023 1708   GLUCOSE 91 10/23/2018 0831   BUN 9 12/26/2023 1708   CREATININE 0.94 12/26/2023 1708   CALCIUM 9.6 12/26/2023 1708   PROT 7.3 09/03/2023 1539   ALBUMIN 4.7 09/03/2023 1539   AST 41 (H) 09/03/2023 1539   ALT 61 (H) 09/03/2023 1539   ALKPHOS 76 09/03/2023 1539   BILITOT 0.2 09/03/2023 1539   GFRNONAA 72 12/01/2020 1732   GFRAA 83 12/01/2020 1732   Lab Results  Component Value Date   CHOL 215 (H) 05/29/2023   HDL 72 05/29/2023   LDLCALC 102 (H) 05/29/2023   TRIG 246 (H) 05/29/2023   CHOLHDL 3.0 05/29/2023   Lab Results  Component Value Date   HGBA1C 5.7 (H) 12/26/2023   Lab Results  Component Value Date   VITAMINB12 >2000 (H) 05/21/2022   Lab Results  Component Value Date   TSH 0.926 05/29/2023      ASSESSMENT AND PLAN 53 y.o. year old female  has a past medical history of Allergy, Anxiety, Back pain, Chronic RLQ pain, Constipation, Deviated septum, Diverticulitis, Ectopic pregnancy (1995), Eczema, GERD (gastroesophageal reflux disease), HTN (hypertension) (05/24/2015), Migraine without aura and without status migrainosus, not intractable, Miscarriage, Obesity, unspecified (07/06/2010), OSA (obstructive sleep apnea), Pelvic congestion, and Sleep apnea. here with:  OSA on CPAP  CPAP compliance suboptimal Residual AHI is good Mask refitting  ordered.  Will try DreamWear fullface mask.  If this is not beneficial then we will consider dental device Encouraged patient to continue using CPAP nightly and > 4 hours each night F/U in 1 year or sooner if needed    Butch Penny, MSN, NP-C 12/31/2023, 1:55  PM [facility] [address] [place], [place] [address] [phone]

## 2024-01-13 ENCOUNTER — Ambulatory Visit (INDEPENDENT_AMBULATORY_CARE_PROVIDER_SITE_OTHER): Admitting: Internal Medicine

## 2024-01-13 ENCOUNTER — Encounter: Payer: Self-pay | Admitting: Internal Medicine

## 2024-01-13 ENCOUNTER — Other Ambulatory Visit (HOSPITAL_COMMUNITY): Payer: Self-pay

## 2024-01-13 ENCOUNTER — Other Ambulatory Visit: Payer: Self-pay

## 2024-01-13 VITALS — BP 106/78 | HR 109 | Temp 98.8°F | Ht 65.0 in | Wt 222.8 lb

## 2024-01-13 DIAGNOSIS — J0100 Acute maxillary sinusitis, unspecified: Secondary | ICD-10-CM

## 2024-01-13 DIAGNOSIS — E66812 Obesity, class 2: Secondary | ICD-10-CM | POA: Diagnosis not present

## 2024-01-13 DIAGNOSIS — R058 Other specified cough: Secondary | ICD-10-CM | POA: Diagnosis not present

## 2024-01-13 DIAGNOSIS — Z6837 Body mass index (BMI) 37.0-37.9, adult: Secondary | ICD-10-CM

## 2024-01-13 LAB — POC SOFIA 2 FLU + SARS ANTIGEN FIA
Influenza A, POC: NEGATIVE
Influenza B, POC: NEGATIVE
SARS Coronavirus 2 Ag: NEGATIVE

## 2024-01-13 MED ORDER — DOXYCYCLINE HYCLATE 100 MG PO TABS
100.0000 mg | ORAL_TABLET | Freq: Two times a day (BID) | ORAL | 0 refills | Status: DC
  Filled 2024-01-13: qty 20, 10d supply, fill #0

## 2024-01-13 MED ORDER — HYDROCODONE BIT-HOMATROP MBR 5-1.5 MG/5ML PO SOLN
5.0000 mL | Freq: Four times a day (QID) | ORAL | 0 refills | Status: DC | PRN
  Filled 2024-01-13: qty 120, 6d supply, fill #0

## 2024-01-13 NOTE — Assessment & Plan Note (Signed)
 She was congratulated on her 6lb weight loss. She will continue with Zepbound 10mg  weekly until I see her again in April 2025. She is encouraged to keep up her efforts.

## 2024-01-13 NOTE — Assessment & Plan Note (Addendum)
 Again, she is COVID/flu A and B negative. PDMP reviewed.  I will send rx hydromet to use nightly prn.

## 2024-01-13 NOTE — Progress Notes (Signed)
 I,Victoria T Deloria Lair, CMA,acting as a Neurosurgeon for Gwynneth Aliment, MD.,have documented all relevant documentation on the behalf of Gwynneth Aliment, MD,as directed by  Gwynneth Aliment, MD while in the presence of Gwynneth Aliment, MD.  Subjective:  Patient ID: Desiree Dunn , female    DOB: [birth year] , 53 y.o.   MRN: [id number]  Chief Complaint  Patient presents with   URI    HPI  Patient presents today experiencing cold like symptoms. She states her sx initially started Friday. She went out of town this weekend for her husbands birthday; however, had to cut the trip short because she felt so bad. She c/o sinus congestion, dry cough, swollen lymph nodes and ear pain.  Denies chills/fever. She has taken both Coricidin and Mucinex without relief of her sx.   She states her son was diagnosed with stomach virus last Friday.       Past Medical History:  Diagnosis Date   Allergy    Anxiety    Back pain    Chronic RLQ pain    since 1995 when she had ectopic preg   Constipation    Deviated septum    Diverticulitis    Ectopic pregnancy 1995   x 1 , R side    Eczema    GERD (gastroesophageal reflux disease)    HTN (hypertension) 05/24/2015   Migraine without aura and without status migrainosus, not intractable    Miscarriage    x 1   Obesity, unspecified 07/06/2010   OSA (obstructive sleep apnea)    Pelvic congestion    h/o    Sleep apnea    uses cpap     Family History  Problem Relation Age of Onset   Colon cancer Mother 41   Colon cancer Maternal Aunt 50   Stroke Father    Diabetes Father    Hypertension Father    Hyperlipidemia Father    Heart disease Father    Obesity Father    Hypertension Sister    Colon polyps Brother    Colitis Brother    Depression Son    Asperger's syndrome Son    Breast cancer Paternal Grandmother    Prostate cancer Brother    Heart attack Neg Hx    Esophageal cancer Neg Hx    Rectal cancer Neg Hx    Stomach cancer Neg Hx       Current Outpatient Medications:    acetaminophen (TYLENOL) 500 MG tablet, Take 1,000 mg by mouth as needed for mild pain or headache., Disp: , Rfl:    albuterol (VENTOLIN HFA) 108 (90 Base) MCG/ACT inhaler, INHALE 2 PUFFS BY MOUTH EVERY 4 HOURS AS NEEDED, Disp: 6.7 g, Rfl: 2   Azelastine-Fluticasone (DYMISTA) 137-50 MCG/ACT SUSP, Place 2 sprays into both nostrils daily., Disp: 23 g, Rfl: 5   citalopram (CELEXA) 10 MG tablet, TAKE 1 TABLET BY MOUTH EVERY DAY, Disp: 90 tablet, Rfl: 1   cyclobenzaprine (FLEXERIL) 10 MG tablet, TAKE 1 TABLET BY MOUTH AT BEDTIME AS NEEDED FOR MUSCLE SPASMS, Disp: 20 tablet, Rfl: 0   doxycycline (VIBRA-TABS) 100 MG tablet, Take 1 tablet (100 mg total) by mouth 2 (two) times daily., Disp: 20 tablet, Rfl: 0   EPINEPHrine (EPIPEN 2-PAK) 0.3 mg/0.3 mL IJ SOAJ injection, Inject 0.3 mg into the muscle as needed for anaphylaxis., Disp: 2 each, Rfl: 2   hydrochlorothiazide (MICROZIDE) 12.5 MG capsule, Take 1 capsule (12.5 mg total) by mouth daily., Disp: 90 capsule, Rfl: 3  HYDROcodone bit-homatropine (HYDROMET) 5-1.5 MG/5ML syrup, Take 5 mLs by mouth every 6 (six) hours as needed., Disp: 120 mL, Rfl: 0   montelukast (SINGULAIR) 10 MG tablet, Take 1 tablet (10 mg total) by mouth at bedtime., Disp: 90 tablet, Rfl: 1   olmesartan (BENICAR) 40 MG tablet, Take 1 tablet (40 mg total) by mouth daily., Disp: 90 tablet, Rfl: 1   oxymetazoline (AFRIN NASAL SPRAY) 0.05 % nasal spray, Place 2 sprays into both nostrils 2 (two) times daily. Use no more than 3-5 days at a time., Disp: 30 mL, Rfl: 0   tirzepatide (ZEPBOUND) 10 MG/0.5ML Pen, Inject 10 mg into the skin once a week., Disp: 2 mL, Rfl: 1   verapamil (CALAN-SR) 120 MG CR tablet, Take 1 tablet (120 mg total) by mouth daily., Disp: 90 tablet, Rfl: 1   Allergies  Allergen Reactions   Amoxicillin Hives    Hives     Review of Systems  Constitutional:  Positive for fatigue.  HENT:  Positive for congestion and ear pain.    Respiratory:  Positive for cough.   Cardiovascular: Negative.   Neurological: Negative.   Psychiatric/Behavioral: Negative.       Today's Vitals   01/13/24 1615  BP: 106/78  Pulse: (!) 109  Temp: 98.8 F (37.1 C)  SpO2: 98%  Weight: 222 lb 12.8 oz (101.1 kg)  Height: 5\' 5"  (1.651 m)   Body mass index is 37.08 kg/m.  Wt Readings from Last 3 Encounters:  01/13/24 222 lb 12.8 oz (101.1 kg)  12/26/23 228 lb 3.2 oz (103.5 kg)  10/16/23 232 lb (105.2 kg)     Objective:  Physical Exam Vitals and nursing note reviewed.  Constitutional:      Appearance: She is obese. She is ill-appearing.  HENT:     Head: Normocephalic and atraumatic.     Right Ear: Tympanic membrane, ear canal and external ear normal. There is no impacted cerumen.     Left Ear: Ear canal and external ear normal. There is impacted cerumen.  Eyes:     Extraocular Movements: Extraocular movements intact.  Cardiovascular:     Rate and Rhythm: Normal rate and regular rhythm.     Heart sounds: Normal heart sounds.  Pulmonary:     Effort: Pulmonary effort is normal.     Breath sounds: Normal breath sounds.  Musculoskeletal:     Cervical back: Normal range of motion.  Skin:    General: Skin is warm.  Neurological:     General: No focal deficit present.     Mental Status: She is alert.  Psychiatric:        Mood and Affect: Mood normal.        Behavior: Behavior normal.         Assessment And Plan:  Acute non-recurrent maxillary sinusitis Assessment & Plan: COVID and flu A and B negative. I will send rx doxycycline 100mg  twice daily x 10 days. She is encouraged to take full course of abx. She is advised to drink at least one hot beverage daily and to avoid dairy. She will let me know if her sx persist/worsen. She was given work note to RTW on Wed 01/15/24.   Orders: -     POC SOFIA 2 FLU + SARS ANTIGEN FIA  Dry cough Assessment & Plan: Again, she is COVID/flu A and B negative. PDMP reviewed.  I will  send rx hydromet to use nightly prn.    Class 2 severe obesity due to  excess calories with serious comorbidity and body mass index (BMI) of 37.0 to 37.9 in adult [facility]) Assessment & Plan: She was congratulated on her 6lb weight loss. She will continue with Zepbound 10mg  weekly until I see her again in April 2025. She is encouraged to keep up her efforts.    Other orders -     HYDROcodone Bit-Homatrop MBr; Take 5 mLs by mouth every 6 (six) hours as needed.  Dispense: 120 mL; Refill: 0 -     Doxycycline Hyclate; Take 1 tablet (100 mg total) by mouth 2 (two) times daily.  Dispense: 20 tablet; Refill: 0  She is encouraged to strive for BMI less than 30 to decrease cardiac risk. Advised to aim for at least 150 minutes of exercise per week.    Return if symptoms worsen or fail to improve.  Patient was given opportunity to ask questions. Patient verbalized understanding of the plan and was able to repeat key elements of the plan. All questions were answered to their satisfaction.    I, Gwynneth Aliment, MD, have reviewed all documentation for this visit. The documentation on 01/13/24 for the exam, diagnosis, procedures, and orders are all accurate and complete.   IF YOU HAVE BEEN REFERRED TO A SPECIALIST, IT MAY TAKE 1-2 WEEKS TO SCHEDULE/PROCESS THE REFERRAL. IF YOU HAVE NOT HEARD FROM US/SPECIALIST IN TWO WEEKS, PLEASE GIVE [place] A CALL AT [phone].   THE PATIENT IS ENCOURAGED TO PRACTICE SOCIAL DISTANCING DUE TO THE COVID-19 PANDEMIC.

## 2024-01-13 NOTE — Assessment & Plan Note (Signed)
 COVID and flu A and B negative. I will send rx doxycycline 100mg  twice daily x 10 days. She is encouraged to take full course of abx. She is advised to drink at least one hot beverage daily and to avoid dairy. She will let me know if her sx persist/worsen. She was given work note to RTW on Wed 01/15/24.

## 2024-01-13 NOTE — Patient Instructions (Signed)

## 2024-01-17 ENCOUNTER — Other Ambulatory Visit (HOSPITAL_COMMUNITY): Payer: Self-pay

## 2024-01-19 ENCOUNTER — Encounter: Payer: Self-pay | Admitting: Internal Medicine

## 2024-01-20 ENCOUNTER — Other Ambulatory Visit (HOSPITAL_COMMUNITY): Payer: Self-pay

## 2024-01-20 ENCOUNTER — Other Ambulatory Visit: Payer: Self-pay | Admitting: Internal Medicine

## 2024-01-20 MED ORDER — FLUCONAZOLE 150 MG PO TABS
ORAL_TABLET | ORAL | 0 refills | Status: DC
  Filled 2024-01-20: qty 2, 2d supply, fill #0

## 2024-01-31 ENCOUNTER — Other Ambulatory Visit (HOSPITAL_COMMUNITY): Payer: Self-pay

## 2024-01-31 MED ORDER — CEFDINIR 300 MG PO CAPS
300.0000 mg | ORAL_CAPSULE | Freq: Two times a day (BID) | ORAL | 0 refills | Status: DC
  Filled 2024-01-31: qty 20, 10d supply, fill #0

## 2024-01-31 MED ORDER — PREDNISONE 20 MG PO TABS
40.0000 mg | ORAL_TABLET | Freq: Every day | ORAL | 0 refills | Status: DC
  Filled 2024-01-31: qty 10, 5d supply, fill #0

## 2024-02-01 ENCOUNTER — Other Ambulatory Visit (HOSPITAL_COMMUNITY): Payer: Self-pay

## 2024-02-03 ENCOUNTER — Other Ambulatory Visit: Payer: Self-pay | Admitting: Internal Medicine

## 2024-02-05 ENCOUNTER — Ambulatory Visit: Admitting: Internal Medicine

## 2024-02-15 ENCOUNTER — Other Ambulatory Visit (HOSPITAL_COMMUNITY): Payer: Self-pay

## 2024-02-17 ENCOUNTER — Other Ambulatory Visit (HOSPITAL_COMMUNITY): Payer: Self-pay

## 2024-02-24 ENCOUNTER — Encounter: Payer: Self-pay | Admitting: Internal Medicine

## 2024-02-24 ENCOUNTER — Other Ambulatory Visit (HOSPITAL_COMMUNITY): Payer: Self-pay

## 2024-02-24 ENCOUNTER — Other Ambulatory Visit: Payer: Self-pay | Admitting: Internal Medicine

## 2024-02-24 MED ORDER — ZEPBOUND 10 MG/0.5ML ~~LOC~~ SOAJ
10.0000 mg | SUBCUTANEOUS | 1 refills | Status: DC
  Filled 2024-02-24: qty 2, 28d supply, fill #0

## 2024-02-25 ENCOUNTER — Other Ambulatory Visit (HOSPITAL_COMMUNITY): Payer: Self-pay

## 2024-03-05 ENCOUNTER — Encounter: Payer: Self-pay | Admitting: Internal Medicine

## 2024-03-05 ENCOUNTER — Other Ambulatory Visit (HOSPITAL_COMMUNITY): Payer: Self-pay

## 2024-03-05 ENCOUNTER — Ambulatory Visit (INDEPENDENT_AMBULATORY_CARE_PROVIDER_SITE_OTHER): Payer: Managed Care, Other (non HMO) | Admitting: Internal Medicine

## 2024-03-05 VITALS — BP 120/80 | HR 98 | Temp 98.7°F | Ht 65.0 in | Wt 217.6 lb

## 2024-03-05 DIAGNOSIS — R748 Abnormal levels of other serum enzymes: Secondary | ICD-10-CM

## 2024-03-05 DIAGNOSIS — Z6836 Body mass index (BMI) 36.0-36.9, adult: Secondary | ICD-10-CM

## 2024-03-05 DIAGNOSIS — I10 Essential (primary) hypertension: Secondary | ICD-10-CM

## 2024-03-05 DIAGNOSIS — E66812 Obesity, class 2: Secondary | ICD-10-CM

## 2024-03-05 MED ORDER — ZEPBOUND 10 MG/0.5ML ~~LOC~~ SOAJ
10.0000 mg | SUBCUTANEOUS | 1 refills | Status: DC
  Filled 2024-03-05 – 2024-03-19 (×2): qty 2, 28d supply, fill #0
  Filled 2024-06-15: qty 2, 28d supply, fill #1

## 2024-03-05 NOTE — Progress Notes (Signed)
 I,Victoria T Basil Lim, CMA,acting as a Neurosurgeon for Smiley Dung, MD.,have documented all relevant documentation on the behalf of Smiley Dung, MD,as directed by  Smiley Dung, MD while in the presence of Smiley Dung, MD.  Subjective:  Patient ID: Desiree Dunn , female    DOB: 03/05/1971 , 53 y.o.   MRN: [id number]  Chief Complaint  Patient presents with   Weight Check    Patient presents today for weight check. She reports compliance with Zepbound  10MG . Denies nausea, diarrhea & vomiting. She is doing well with the medication.     HPI Discussed the use of AI scribe software for clinical note transcription with the patient, who gave verbal consent to proceed.  History of Present Illness Desiree Dunn is a 53 year old female with hypertension who presents for follow-up on weight and blood pressure.  She manages her hypertension with hydrochlorothiazide  12.5 mg daily and verapamil  120 mg. Her blood pressure is stable today, despite not having eaten due to a busy workday at a [employer] school. Her active lifestyle has affected her eating habits.  She is focused on weight management and aims to reduce her weight to below 200 pounds. She takes tirzepatide , which helps suppress her snacking habits. She is also trying to improve her water intake and reduce her consumption of ginger ale.  She manages asthma with montelukast  and an albuterol  inhaler as needed. She has an adequate supply of the inhaler and continues montelukast . She recently completed a course of cefdinir  and prednisone  for walking pneumonia, which was treated at urgent care after experiencing symptoms such as chest pain and cough.  Socially, she works at [employer] and has been busy since returning from spring break. She is planning a family reunion in [place] and a potential trip to [place] for her daughter's graduation celebration.    Past Medical History:  Diagnosis Date   Allergy      Anxiety    Back pain    Chronic RLQ pain    since 1995 when she had ectopic preg   Constipation    Deviated septum    Diverticulitis    Ectopic pregnancy 1995   x 1 , R side    Eczema    GERD (gastroesophageal reflux disease)    HTN (hypertension) 05/24/2015   Migraine without aura and without status migrainosus, not intractable    Miscarriage    x 1   Obesity, unspecified 07/06/2010   OSA (obstructive sleep apnea)    Pelvic congestion    h/o    Sleep apnea    uses cpap     Family History  Problem Relation Age of Onset   Colon cancer Mother 30   Colon cancer Maternal Aunt 50   Stroke Father    Diabetes Father    Hypertension Father    Hyperlipidemia Father    Heart disease Father    Obesity Father    Hypertension Sister    Colon polyps Brother    Colitis Brother    Depression Son    Asperger's syndrome Son    Breast cancer Paternal Grandmother    Prostate cancer Brother    Heart attack Neg Hx    Esophageal cancer Neg Hx    Rectal cancer Neg Hx    Stomach cancer Neg Hx      Current Outpatient Medications:    acetaminophen  (TYLENOL ) 500 MG tablet, Take 1,000 mg by mouth as needed for mild pain or headache., Disp: ,  Rfl:    albuterol  (VENTOLIN  HFA) 108 (90 Base) MCG/ACT inhaler, INHALE 2 PUFFS BY MOUTH EVERY 4 HOURS AS NEEDED, Disp: 6.7 g, Rfl: 2   Azelastine -Fluticasone  (DYMISTA ) 137-50 MCG/ACT SUSP, Place 2 sprays into both nostrils daily., Disp: 23 g, Rfl: 5   cyclobenzaprine  (FLEXERIL ) 10 MG tablet, TAKE 1 TABLET BY MOUTH AT BEDTIME AS NEEDED FOR MUSCLE SPASMS, Disp: 20 tablet, Rfl: 0   EPINEPHrine  (EPIPEN  2-PAK) 0.3 mg/0.3 mL IJ SOAJ injection, Inject 0.3 mg into the muscle as needed for anaphylaxis., Disp: 2 each, Rfl: 2   hydrochlorothiazide  (MICROZIDE ) 12.5 MG capsule, Take 1 capsule (12.5 mg total) by mouth daily., Disp: 90 capsule, Rfl: 3   HYDROcodone  bit-homatropine (HYDROMET) 5-1.5 MG/5ML syrup, Take 5 mLs by mouth every 6 (six) hours as needed., Disp:  120 mL, Rfl: 0   montelukast  (SINGULAIR ) 10 MG tablet, Take 1 tablet (10 mg total) by mouth at bedtime., Disp: 90 tablet, Rfl: 1   olmesartan  (BENICAR ) 40 MG tablet, Take 1 tablet (40 mg total) by mouth daily., Disp: 90 tablet, Rfl: 1   oxymetazoline  (AFRIN NASAL SPRAY) 0.05 % nasal spray, Place 2 sprays into both nostrils 2 (two) times daily. Use no more than 3-5 days at a time., Disp: 30 mL, Rfl: 0   verapamil  (CALAN -SR) 120 MG CR tablet, Take 1 tablet (120 mg total) by mouth daily., Disp: 90 tablet, Rfl: 1   tirzepatide  (ZEPBOUND ) 10 MG/0.5ML Pen, Inject 10 mg into the skin once a week., Disp: 2 mL, Rfl: 1   Allergies  Allergen Reactions   Amoxicillin Hives    Hives     Review of Systems  Constitutional: Negative.   Respiratory: Negative.    Cardiovascular: Negative.   Gastrointestinal: Negative.   Neurological: Negative.   Psychiatric/Behavioral: Negative.       Today's Vitals   03/05/24 1601  BP: 120/80  Pulse: 98  Temp: 98.7 F (37.1 C)  SpO2: 98%  Weight: 217 lb 9.6 oz (98.7 kg)  Height: 5\' 5"  (1.651 m)   Body mass index is 36.21 kg/m.  Wt Readings from Last 3 Encounters:  03/05/24 217 lb 9.6 oz (98.7 kg)  01/13/24 222 lb 12.8 oz (101.1 kg)  12/26/23 228 lb 3.2 oz (103.5 kg)     Objective:  Physical Exam Vitals and nursing note reviewed.  Constitutional:      Appearance: Normal appearance. She is obese.  HENT:     Head: Normocephalic and atraumatic.  Eyes:     Extraocular Movements: Extraocular movements intact.  Cardiovascular:     Rate and Rhythm: Normal rate and regular rhythm.     Heart sounds: Normal heart sounds.  Pulmonary:     Effort: Pulmonary effort is normal.     Breath sounds: Normal breath sounds.  Musculoskeletal:     Cervical back: Normal range of motion.  Skin:    General: Skin is warm.  Neurological:     General: No focal deficit present.     Mental Status: She is alert.  Psychiatric:        Mood and Affect: Mood normal.         Behavior: Behavior normal.         Assessment And Plan:  Class 2 severe obesity due to excess calories with serious comorbidity and body mass index (BMI) of 36.0 to 36.9 in adult [facility]) Assessment & Plan: She was congratulated on her 5lbs.weight loss and encouraged to keep up the great work. I will send rx  tirzepatide  10mg  weekly to pharmacy. She will f/u in 8-10 weeks. Reminded to incorporate weight- training into her workout routine and to be intentional about her protein intake.    Essential hypertension Assessment & Plan: Chronic, controlled. No med changes today. She will continue with hydrochlorothiazide  12.5mg  daily, verapamil  120mcg CR daily and olmesartan  40mg  daily. She is reminded to follow low sodium diet. She will f/u in four months.    Elevated liver enzymes -     Hepatic function panel  Other orders -     Zepbound ; Inject 10 mg into the skin once a week.  Dispense: 2 mL; Refill: 1    Return if symptoms worsen or fail to improve.  Patient was given opportunity to ask questions. Patient verbalized understanding of the plan and was able to repeat key elements of the plan. All questions were answered to their satisfaction.   I, Smiley Dung, MD, have reviewed all documentation for this visit. The documentation on 03/05/24 for the exam, diagnosis, procedures, and orders are all accurate and complete.   IF YOU HAVE BEEN REFERRED TO A SPECIALIST, IT MAY TAKE 1-2 WEEKS TO SCHEDULE/PROCESS THE REFERRAL. IF YOU HAVE NOT HEARD FROM US /SPECIALIST IN TWO WEEKS, PLEASE GIVE US  A CALL AT [phone].   THE PATIENT IS ENCOURAGED TO PRACTICE SOCIAL DISTANCING DUE TO THE COVID-19 PANDEMIC.

## 2024-03-05 NOTE — Patient Instructions (Signed)

## 2024-03-06 ENCOUNTER — Other Ambulatory Visit (HOSPITAL_COMMUNITY): Payer: Self-pay

## 2024-03-06 LAB — HEPATIC FUNCTION PANEL
ALT: 33 IU/L — ABNORMAL HIGH (ref 0–32)
AST: 30 IU/L (ref 0–40)
Albumin: 4.6 g/dL (ref 3.8–4.9)
Alkaline Phosphatase: 74 IU/L (ref 44–121)
Bilirubin Total: 0.7 mg/dL (ref 0.0–1.2)
Bilirubin, Direct: 0.23 mg/dL (ref 0.00–0.40)
Total Protein: 7.5 g/dL (ref 6.0–8.5)

## 2024-03-09 ENCOUNTER — Encounter: Payer: Self-pay | Admitting: Internal Medicine

## 2024-03-09 ENCOUNTER — Other Ambulatory Visit (HOSPITAL_COMMUNITY): Payer: Self-pay

## 2024-03-09 MED FILL — Verapamil HCl Tab ER 120 MG: ORAL | 90 days supply | Qty: 90 | Fill #1 | Status: AC

## 2024-03-09 MED FILL — Hydrochlorothiazide Cap 12.5 MG: ORAL | 90 days supply | Qty: 90 | Fill #1 | Status: AC

## 2024-03-09 MED FILL — Olmesartan Medoxomil Tab 40 MG: ORAL | 90 days supply | Qty: 90 | Fill #1 | Status: AC

## 2024-03-09 NOTE — Assessment & Plan Note (Signed)
 She was congratulated on her 5lbs.weight loss and encouraged to keep up the great work. I will send rx tirzepatide  10mg  weekly to pharmacy. She will f/u in 8-10 weeks. Reminded to incorporate weight- training into her workout routine and to be intentional about her protein intake.

## 2024-03-09 NOTE — Assessment & Plan Note (Signed)
 Chronic, controlled. No med changes today. She will continue with hydrochlorothiazide 12.5mg  daily, verapamil CR daily and olmesartan 40mg  daily. She is reminded to follow low sodium diet. She will f/u in four months.

## 2024-03-19 ENCOUNTER — Other Ambulatory Visit: Payer: Self-pay

## 2024-03-19 ENCOUNTER — Other Ambulatory Visit (HOSPITAL_COMMUNITY): Payer: Self-pay

## 2024-03-30 ENCOUNTER — Other Ambulatory Visit: Payer: Self-pay | Admitting: Internal Medicine

## 2024-03-30 ENCOUNTER — Other Ambulatory Visit: Payer: Self-pay

## 2024-03-30 ENCOUNTER — Other Ambulatory Visit (HOSPITAL_COMMUNITY): Payer: Self-pay

## 2024-03-30 DIAGNOSIS — Z0000 Encounter for general adult medical examination without abnormal findings: Secondary | ICD-10-CM

## 2024-03-30 MED ORDER — ZEPBOUND 12.5 MG/0.5ML ~~LOC~~ SOAJ
12.5000 mg | SUBCUTANEOUS | 1 refills | Status: DC
Start: 2024-03-30 — End: 2024-06-15
  Filled 2024-03-30: qty 2, 28d supply, fill #0
  Filled 2024-05-07 – 2024-05-19 (×3): qty 2, 28d supply, fill #1
  Filled ????-??-??: fill #1

## 2024-04-07 ENCOUNTER — Other Ambulatory Visit (HOSPITAL_BASED_OUTPATIENT_CLINIC_OR_DEPARTMENT_OTHER): Payer: Self-pay

## 2024-04-08 ENCOUNTER — Encounter: Payer: Self-pay | Admitting: Internal Medicine

## 2024-04-12 ENCOUNTER — Encounter: Payer: Self-pay | Admitting: Allergy

## 2024-04-14 ENCOUNTER — Other Ambulatory Visit (HOSPITAL_COMMUNITY): Payer: Self-pay

## 2024-04-14 ENCOUNTER — Other Ambulatory Visit (HOSPITAL_BASED_OUTPATIENT_CLINIC_OR_DEPARTMENT_OTHER): Payer: Self-pay

## 2024-04-14 ENCOUNTER — Other Ambulatory Visit: Payer: Self-pay

## 2024-04-14 ENCOUNTER — Ambulatory Visit (INDEPENDENT_AMBULATORY_CARE_PROVIDER_SITE_OTHER): Admitting: Allergy & Immunology

## 2024-04-14 ENCOUNTER — Encounter: Payer: Self-pay | Admitting: Allergy & Immunology

## 2024-04-14 ENCOUNTER — Other Ambulatory Visit: Payer: Self-pay | Admitting: Allergy

## 2024-04-14 VITALS — BP 123/79 | HR 97 | Temp 98.1°F | Resp 18 | Ht 64.0 in | Wt 211.0 lb

## 2024-04-14 DIAGNOSIS — T7840XD Allergy, unspecified, subsequent encounter: Secondary | ICD-10-CM

## 2024-04-14 DIAGNOSIS — J3089 Other allergic rhinitis: Secondary | ICD-10-CM

## 2024-04-14 DIAGNOSIS — J45990 Exercise induced bronchospasm: Secondary | ICD-10-CM

## 2024-04-14 DIAGNOSIS — W57XXXA Bitten or stung by nonvenomous insect and other nonvenomous arthropods, initial encounter: Secondary | ICD-10-CM

## 2024-04-14 MED ORDER — METHYLPREDNISOLONE ACETATE 80 MG/ML IJ SUSP
80.0000 mg | Freq: Once | INTRAMUSCULAR | Status: AC
  Administered 2024-04-14: 80 mg via INTRAMUSCULAR

## 2024-04-14 MED ORDER — HYDROXYZINE HCL 25 MG PO TABS
25.0000 mg | ORAL_TABLET | Freq: Three times a day (TID) | ORAL | 0 refills | Status: DC | PRN
  Filled 2024-04-14: qty 30, 10d supply, fill #0

## 2024-04-14 MED ORDER — ALBUTEROL SULFATE HFA 108 (90 BASE) MCG/ACT IN AERS
INHALATION_SPRAY | RESPIRATORY_TRACT | 2 refills | Status: DC
Start: 2024-04-14 — End: 2024-08-13
  Filled 2024-04-14: qty 6.7, 30d supply, fill #0
  Filled 2024-05-19: qty 6.7, 30d supply, fill #1
  Filled 2024-07-20: qty 6.7, 30d supply, fill #2
  Filled ????-??-??: fill #1

## 2024-04-14 MED ORDER — PREDNISONE 10 MG PO TABS
ORAL_TABLET | ORAL | 0 refills | Status: DC
Start: 2024-04-14 — End: 2024-07-28
  Filled 2024-04-14: qty 18, 12d supply, fill #0

## 2024-04-14 MED ORDER — HYDROCORTISONE 2.5 % EX CREA
TOPICAL_CREAM | Freq: Two times a day (BID) | CUTANEOUS | 0 refills | Status: DC
  Filled 2024-04-14: qty 454, 45d supply, fill #0

## 2024-04-14 MED ORDER — MONTELUKAST SODIUM 10 MG PO TABS
10.0000 mg | ORAL_TABLET | Freq: Every day | ORAL | 1 refills | Status: DC
  Filled 2024-04-14: qty 90, 90d supply, fill #0

## 2024-04-14 NOTE — Patient Instructions (Addendum)
 1. Insect bies over the entire body - I have never seen this!  - We gave you a Depo injection 80mg  today which should remain in your body for a few days. - Start a prednisone  taper (low dose): Take one tablet (10mg ) twice daily for six days days, then one tablet (10mg ) once daily for six days, then STOP. - Continue with Allegra , but double to two tablets in the morning for TWO WEEKS. - Continue with Xyzal , but double to two tablets in the evening for TWO WEEKS. - Add on hydroxyzine 25mg  up to THREE TIMES DAILY. - Add on hydrocortisone  2.5% cream twice daily to the entire body for two weeks.   2. Follow up as scheduled with Dr. Tempie Fee, but call with updates.    Please inform us  of any Emergency Department visits, hospitalizations, or changes in symptoms. Call us  before going to the ED for breathing or allergy  symptoms since we might be able to fit you in for a sick visit. Feel free to contact us  anytime with any questions, problems, or concerns.  It was a pleasure to meet you today!  Websites that have reliable patient information: 1. American Academy of Asthma, Allergy , and Immunology: www.aaaai.org 2. Food Allergy  Research and Education (FARE): foodallergy.org 3. Mothers of Asthmatics: http://www.asthmacommunitynetwork.org 4. [employer] of Allergy , Asthma, and Immunology: www.acaai.org      "Like" us  on Facebook and Instagram for our latest updates!      A healthy democracy works best when [employer] participate! Make sure you are registered to vote! If you have moved or changed any of your contact information, you will need to get this updated before voting! Scan the QR codes below to learn more!

## 2024-04-14 NOTE — Progress Notes (Signed)
 FOLLOW UP  Date of Service/Encounter:  04/14/24   Assessment:   Perennial and seasonal allergic rhinitis   Exercise induced asthma  Plan/Recommendations:   1. Insect bies over the entire body - I have never seen this!  - We gave you a Depo injection 80mg  today which should remain in your body for a few days. - Start a prednisone  taper (low dose): Take one tablet (10mg ) twice daily for six days days, then one tablet (10mg ) once daily for six days, then STOP. - Continue with Allegra , but double to two tablets in the morning for TWO WEEKS. - Continue with Xyzal , but double to two tablets in the evening for TWO WEEKS. - Add on hydroxyzine 25mg  up to THREE TIMES DAILY. - Add on hydrocortisone  2.5% cream twice daily to the entire body for two weeks.   2. Follow up as scheduled with Dr. Tempie Fee, but call with updates.    Subjective:   Desiree Dunn is a 53 y.o. female presenting today for follow up of  Chief Complaint  Patient presents with   Rash    Patients reports the development of red itchy bites following a bird infestaation up in the vents in her bathroom. The exterminator was called to remove them. Since than the patient has been experiencing bites which are believed to bee bird mites. The affected are causing discomfort, such as burning and itching.     Desiree Dunn has a history of the following: Patient Active Problem List   Diagnosis Date Noted   Acute non-recurrent maxillary sinusitis 01/13/2024   Dry cough 01/13/2024   Left upper quadrant abdominal pain 09/03/2023   Umbilical pain 09/03/2023   Hypokalemia 07/08/2023   Elevated liver enzymes 07/08/2023   PTSD (post-traumatic stress disorder) 06/10/2023   Female climacteric state 06/10/2023   Immunization due 05/15/2023   Postnasal drip 05/20/2019   Sorethroat 05/20/2019   Rash 11/21/2018   Non-seasonal allergic rhinitis 11/21/2018   Hypertrophy, nasal, turbinate 10/08/2018   Moderate  obstructive sleep apnea-hypopnea syndrome 10/08/2018   Deviated septum 10/08/2018   OSA on CPAP 10/08/2018   Insulin  resistance 09/15/2018   Obstructive sleep apnea syndrome 09/01/2018   PCP NOTES >>> 08/25/2015   Essential hypertension 05/24/2015   Episodic tension-type headache, not intractable 02/11/2015   Migraine without aura and without status migrainosus, not intractable 06/02/2014   GERD (gastroesophageal reflux disease) 12/01/2012   CTS (carpal tunnel syndrome) 12/01/2012   Dermatitis 12/01/2012   Encounter for general adult medical examination w/o abnormal findings 03/19/2012   Headache 12/25/2010   Pain of both breasts 12/25/2010   Class 2 severe obesity due to excess calories with serious comorbidity and body mass index (BMI) of 36.0 to 36.9 in adult (HCC) 07/06/2010   Abdominal pain, chronic, right lower quadrant 06/09/2009   Nausea alone 04/27/2009    History obtained from: chart review and patient.  Discussed the use of AI scribe software for clinical note transcription with the patient and/or guardian, who gave verbal consent to proceed.  Desiree Dunn is a 53 y.o. female presenting for a sick visit.  She was last seen in September 2024 by Dr. Tempie Fee.  At that time, she was continue with antihistamines in the morning and at night.  She also continue with Singulair  10 mg daily at bedtime.  She was continued on Ryaltris  2 sprays per nostril twice a day.  For exercise-induced asthma, she was continued on albuterol  2 puffs every 4-6 hours as needed.  Since last visit, she  has done well.   She experienced a severe allergic reaction that began last Wednesday after discovering bird mites in her home. Intense itching and a rash have spread across her body, including her arms, hands, back, legs, and underarms. She is the only one in her family affected, despite sharing living spaces with her husband and children.  She has a history of environmental allergies, including dust mites, and  has been taking Allegra  in the morning and Xyzal  at night for her allergies. Recently, she has been taking Benadryl frequently to manage the itching, but it has not provided relief. She has also used calamine lotion without success.  The bird mites were discovered in a dryer vent where birds had nested. The mites spread into her bathroom, and she suspects she may have come into contact with them there. An exterminator was called, and the area was sprayed yesterday.  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 She has not experienced a similar reaction before, noting that the last time she had a significant rash was during a chickenpox infection in high school.  No diabetes, but she mentions being pre-diabetic. She has not been on allergy  shots recently but had them in the past.   Otherwise, there have been no changes to her past medical history, surgical history, family history, or social history.    Review of systems otherwise negative other than that mentioned in the HPI.    Objective:   Blood pressure 123/79, pulse 97, temperature 98.1 F (36.7 C), temperature source Temporal, resp. rate 18, height 5\' 4"  (1.626 m), weight 211 lb (95.7 kg), SpO2 98%. Body mass index is 36.22 kg/m.    Physical Exam Constitutional:      Appearance: She is well-developed.  HENT:     Head: Normocephalic and atraumatic.     Right Ear: Tympanic membrane, ear canal and external ear normal.     Left Ear: Tympanic membrane, ear canal and external ear normal.     Nose: No nasal deformity, septal deviation, mucosal edema or rhinorrhea.     Right Turbinates: Not enlarged or swollen.     Left Turbinates: Not enlarged or swollen.     Right Sinus: No maxillary sinus tenderness or frontal sinus tenderness.     Left Sinus: No maxillary sinus tenderness or frontal sinus tenderness.     Mouth/Throat:      Mouth: Mucous membranes are not pale and not dry.     Pharynx: Uvula midline.  Eyes:     General: Lids are normal. No allergic shiner.       Right eye: No discharge.        Left eye: No discharge.     Conjunctiva/sclera: Conjunctivae normal.     Right eye: Right conjunctiva is not injected. No chemosis.    Left eye: Left conjunctiva is not injected. No chemosis.    Pupils: Pupils are equal, round, and reactive to light.  Cardiovascular:     Rate and Rhythm: Normal rate and regular rhythm.     Heart sounds: Normal heart sounds.  Pulmonary:     Effort: Pulmonary effort is normal. No tachypnea, accessory muscle usage or respiratory distress.     Breath sounds: Normal breath sounds. No wheezing, rhonchi or rales.  Chest:     Chest wall: No tenderness.  Lymphadenopathy:     Cervical: No cervical adenopathy.  Skin:    General: Skin  is warm.     Capillary Refill: Capillary refill takes less than 2 seconds.     Coloration: Skin is not pale.     Findings: No abrasion, erythema, petechiae or rash. Rash is not papular, urticarial or vesicular.     Comments: There are some excoriations noted on the bilateral arms. There are tiny pustules noted without discharge or honey crusting.   Neurological:     Mental Status: She is alert.  Psychiatric:        Behavior: Behavior is cooperative.      Diagnostic studies: none  DepoMedrol 80mg  given in clinic today.     Drexel Gentles, MD  [facility] 

## 2024-04-15 ENCOUNTER — Other Ambulatory Visit (HOSPITAL_COMMUNITY): Payer: Self-pay

## 2024-04-16 ENCOUNTER — Other Ambulatory Visit (HOSPITAL_COMMUNITY): Payer: Self-pay

## 2024-04-17 ENCOUNTER — Other Ambulatory Visit (HOSPITAL_COMMUNITY): Payer: Self-pay

## 2024-05-07 ENCOUNTER — Other Ambulatory Visit (HOSPITAL_COMMUNITY): Payer: Self-pay

## 2024-05-08 ENCOUNTER — Other Ambulatory Visit: Payer: Self-pay

## 2024-05-08 ENCOUNTER — Other Ambulatory Visit (HOSPITAL_COMMUNITY): Payer: Self-pay

## 2024-05-11 ENCOUNTER — Encounter: Payer: Self-pay | Admitting: Internal Medicine

## 2024-05-15 ENCOUNTER — Other Ambulatory Visit: Payer: Self-pay | Admitting: Allergy

## 2024-05-19 ENCOUNTER — Other Ambulatory Visit: Payer: Self-pay

## 2024-05-19 ENCOUNTER — Other Ambulatory Visit (HOSPITAL_COMMUNITY): Payer: Self-pay

## 2024-05-19 MED ORDER — AZELASTINE-FLUTICASONE 137-50 MCG/ACT NA SUSP
2.0000 | Freq: Every day | NASAL | 5 refills | Status: AC
  Filled 2024-09-13 (×2): qty 23, 30d supply, fill #0
  Filled 2024-10-19: qty 23, 30d supply, fill #1

## 2024-05-25 ENCOUNTER — Other Ambulatory Visit: Payer: Self-pay

## 2024-05-25 ENCOUNTER — Other Ambulatory Visit (HOSPITAL_COMMUNITY): Payer: Self-pay

## 2024-05-25 ENCOUNTER — Ambulatory Visit
Admission: RE | Admit: 2024-05-25 | Discharge: 2024-05-25 | Disposition: A | Discharge status: Home / Self Care | Source: Ambulatory Visit | Attending: Internal Medicine | Admitting: Internal Medicine

## 2024-05-25 DIAGNOSIS — Z0000 Encounter for general adult medical examination without abnormal findings: Secondary | ICD-10-CM

## 2024-05-25 MED ORDER — ESTRADIOL 0.1 MG/GM VA CREA
1.0000 g | TOPICAL_CREAM | Freq: Every evening | VAGINAL | 1 refills | Status: AC
  Filled 2024-05-25: qty 42.5, 30d supply, fill #0
  Filled 2024-06-22: qty 42.5, 30d supply, fill #1

## 2024-06-01 ENCOUNTER — Other Ambulatory Visit (HOSPITAL_COMMUNITY): Payer: Self-pay

## 2024-06-01 ENCOUNTER — Other Ambulatory Visit: Payer: Self-pay | Admitting: Internal Medicine

## 2024-06-01 MED ORDER — OLMESARTAN MEDOXOMIL 40 MG PO TABS
40.0000 mg | ORAL_TABLET | Freq: Every day | ORAL | 1 refills | Status: DC
  Filled 2024-06-01: qty 90, 90d supply, fill #0
  Filled 2024-09-04: qty 90, 90d supply, fill #1

## 2024-06-01 MED ORDER — VERAPAMIL HCL ER 120 MG PO TBCR
120.0000 mg | EXTENDED_RELEASE_TABLET | Freq: Every day | ORAL | 1 refills | Status: DC
  Filled 2024-06-01: qty 90, 90d supply, fill #0
  Filled 2024-09-04: qty 10, 10d supply, fill #1
  Filled 2024-09-05: qty 80, 80d supply, fill #1
  Filled 2024-09-07: qty 80, 80d supply, fill #2

## 2024-06-02 ENCOUNTER — Encounter: Payer: BC Managed Care – PPO | Admitting: Internal Medicine

## 2024-06-04 ENCOUNTER — Other Ambulatory Visit (HOSPITAL_COMMUNITY): Payer: Self-pay

## 2024-06-04 MED FILL — Hydrochlorothiazide Cap 12.5 MG: ORAL | 90 days supply | Qty: 90 | Fill #2 | Status: AC

## 2024-06-15 ENCOUNTER — Other Ambulatory Visit: Payer: Self-pay | Admitting: Internal Medicine

## 2024-06-15 ENCOUNTER — Other Ambulatory Visit (HOSPITAL_COMMUNITY): Payer: Self-pay

## 2024-06-17 ENCOUNTER — Other Ambulatory Visit (HOSPITAL_COMMUNITY): Payer: Self-pay

## 2024-06-17 MED ORDER — ZEPBOUND 12.5 MG/0.5ML ~~LOC~~ SOAJ
12.5000 mg | SUBCUTANEOUS | 1 refills | Status: DC
  Filled 2024-06-17: qty 2, 28d supply, fill #0

## 2024-06-19 ENCOUNTER — Other Ambulatory Visit (HOSPITAL_COMMUNITY): Payer: Self-pay

## 2024-06-22 ENCOUNTER — Ambulatory Visit: Admitting: Podiatry

## 2024-06-23 ENCOUNTER — Other Ambulatory Visit (HOSPITAL_COMMUNITY): Payer: Self-pay

## 2024-06-25 ENCOUNTER — Other Ambulatory Visit (HOSPITAL_COMMUNITY): Payer: Self-pay

## 2024-06-25 ENCOUNTER — Ambulatory Visit (INDEPENDENT_AMBULATORY_CARE_PROVIDER_SITE_OTHER): Admitting: Internal Medicine

## 2024-06-25 VITALS — BP 124/82 | HR 85 | Temp 98.7°F | Ht 64.0 in | Wt 211.4 lb

## 2024-06-25 DIAGNOSIS — I10 Essential (primary) hypertension: Secondary | ICD-10-CM | POA: Diagnosis not present

## 2024-06-25 DIAGNOSIS — Z0000 Encounter for general adult medical examination without abnormal findings: Secondary | ICD-10-CM

## 2024-06-25 DIAGNOSIS — H6121 Impacted cerumen, right ear: Secondary | ICD-10-CM | POA: Diagnosis not present

## 2024-06-25 DIAGNOSIS — M545 Low back pain, unspecified: Secondary | ICD-10-CM

## 2024-06-25 DIAGNOSIS — R3915 Urgency of urination: Secondary | ICD-10-CM | POA: Diagnosis not present

## 2024-06-25 DIAGNOSIS — G8929 Other chronic pain: Secondary | ICD-10-CM

## 2024-06-25 DIAGNOSIS — Z6836 Body mass index (BMI) 36.0-36.9, adult: Secondary | ICD-10-CM

## 2024-06-25 DIAGNOSIS — E66812 Obesity, class 2: Secondary | ICD-10-CM

## 2024-06-25 DIAGNOSIS — F4310 Post-traumatic stress disorder, unspecified: Secondary | ICD-10-CM

## 2024-06-25 DIAGNOSIS — E88819 Insulin resistance, unspecified: Secondary | ICD-10-CM

## 2024-06-25 LAB — POCT URINALYSIS DIP (CLINITEK)
Bilirubin, UA: NEGATIVE
Blood, UA: NEGATIVE
Glucose, UA: NEGATIVE mg/dL
Ketones, POC UA: NEGATIVE mg/dL
Leukocytes, UA: NEGATIVE
Nitrite, UA: NEGATIVE
POC PROTEIN,UA: NEGATIVE
Spec Grav, UA: 1.02 (ref 1.010–1.025)
Urobilinogen, UA: 0.2 U/dL
pH, UA: 6 (ref 5.0–8.0)

## 2024-06-25 MED ORDER — ZEPBOUND 12.5 MG/0.5ML ~~LOC~~ SOAJ
12.5000 mg | SUBCUTANEOUS | 3 refills | Status: DC
  Filled 2024-06-25 – 2024-07-20 (×2): qty 2, 28d supply, fill #0
  Filled 2024-08-13: qty 2, 28d supply, fill #1
  Filled 2024-09-12: qty 2, 28d supply, fill #2
  Filled 2024-09-13: qty 2, 28d supply, fill #0

## 2024-06-25 NOTE — Patient Instructions (Signed)

## 2024-06-25 NOTE — Progress Notes (Signed)
 I,Victoria T Emmitt, CMA,acting as a Neurosurgeon for Catheryn LOISE Slocumb, MD.,have documented all relevant documentation on the behalf of Catheryn LOISE Slocumb, MD,as directed by  Catheryn LOISE Slocumb, MD while in the presence of Catheryn LOISE Slocumb, MD.  Subjective:    Patient ID: Desiree Dunn , female    DOB: Mar 18, 1971 , 53 y.o.   MRN: [id number]  Chief Complaint  Patient presents with   Annual Exam    Patient presents today for annual exam. She reports compliance with medications.  GYN: Dr Babetta   Hypertension    HPI Discussed the use of AI scribe software for clinical note transcription with the patient, who gave verbal consent to proceed.  History of Present Illness Desiree Dunn is a 53 year old female who presents for a routine physical and blood pressure check.  She is on hydrochlorothiazide  12.5 mg daily. She experienced a brief period of weight gain due to missing her medication but has since resumed her regimen. She is satisfied with the current dosage and does not wish to increase it.  She experienced a significant allergic reaction to bird mites that entered her home through the vents. She was the only one in her household affected, describing the reaction as 'it ate me alive'. This led to a breakout for which she was prescribed Atarax , which she uses as needed.  She has ongoing back pain, and a coworker advised her that it may be due to a lack of core strength. A coworker advised her to use a weighted vest during walks to strengthen her back. She frequently uses Voltaren  for muscle aches and pains.  She is experiencing marital issues, describing her husband as having become meaner since his illness three years ago. He was diagnosed with sarcoidosis and blood clots in his lungs. She reports a lack of intimacy and communication, leading her to consider separation. She has been in therapy to cope with these issues.  She is actively engaged in self-care, including massages,  social activities, and walking. She is on Zepbound  for weight loss and reports feeling more energetic.  She experiences occasional urinary urgency, especially after taking her morning water pill, but denies frequent incontinence.   Hypertension This is a chronic problem. The current episode started more than 1 year ago. The problem has been gradually improving since onset. The problem is controlled. Pertinent negatives include no blurred vision. Risk factors for coronary artery disease include obesity. Past treatments include ACE inhibitors. The current treatment provides moderate improvement. Compliance problems include exercise.      Past Medical History:  Diagnosis Date   Allergy     Anxiety    Back pain    Chronic RLQ pain    since 1995 when she had ectopic preg   Constipation    Deviated septum    Diverticulitis    Ectopic pregnancy 1995   x 1 , R side    Eczema    GERD (gastroesophageal reflux disease)    HTN (hypertension) 05/24/2015   Migraine without aura and without status migrainosus, not intractable    Miscarriage    x 1   Obesity, unspecified 07/06/2010   OSA (obstructive sleep apnea)    Pelvic congestion    h/o    Sleep apnea    uses cpap     Family History  Problem Relation Age of Onset   Colon cancer Mother 13   Colon cancer Maternal Aunt 27   Stroke Father    Diabetes Father  Hypertension Father    Hyperlipidemia Father    Heart disease Father    Obesity Father    Hypertension Sister    Colon polyps Brother    Colitis Brother    Depression Son    Asperger's syndrome Son    Breast cancer Paternal Grandmother    Prostate cancer Brother    Heart attack Neg Hx    Esophageal cancer Neg Hx    Rectal cancer Neg Hx    Stomach cancer Neg Hx      Current Outpatient Medications:    acetaminophen  (TYLENOL ) 500 MG tablet, Take 1,000 mg by mouth as needed for mild pain or headache., Disp: , Rfl:    albuterol  (VENTOLIN  HFA) 108 (90 Base) MCG/ACT  inhaler, INHALE 2 PUFFS BY MOUTH EVERY 4 HOURS AS NEEDED, Disp: 6.7 g, Rfl: 2   Azelastine -Fluticasone  (DYMISTA ) 137-50 MCG/ACT SUSP, Place 2 sprays into both nostrils daily., Disp: 23 g, Rfl: 5   cyclobenzaprine  (FLEXERIL ) 10 MG tablet, TAKE 1 TABLET BY MOUTH AT BEDTIME AS NEEDED FOR MUSCLE SPASMS, Disp: 20 tablet, Rfl: 0   EPINEPHrine  (EPIPEN  2-PAK) 0.3 mg/0.3 mL IJ SOAJ injection, Inject 0.3 mg into the muscle as needed for anaphylaxis., Disp: 2 each, Rfl: 2   estradiol  (ESTRACE ) 0.1 MG/GM vaginal cream, Place 1 g vaginally every evening for 2 weeks, then twice weekly, Disp: 42.5 g, Rfl: 1   hydrochlorothiazide  (MICROZIDE ) 12.5 MG capsule, Take 1 capsule (12.5 mg total) by mouth daily., Disp: 90 capsule, Rfl: 3   HYDROcodone  bit-homatropine (HYDROMET) 5-1.5 MG/5ML syrup, Take 5 mLs by mouth every 6 (six) hours as needed., Disp: 120 mL, Rfl: 0   hydrocortisone  2.5 % cream, Apply to affected areas 2 (two) times daily as directed., Disp: 454 g, Rfl: 0   hydrOXYzine  (ATARAX ) 25 MG tablet, Take 1 tablet (25 mg total) by mouth 3 (three) times daily as needed., Disp: 30 tablet, Rfl: 0   olmesartan  (BENICAR ) 40 MG tablet, Take 1 tablet (40 mg total) by mouth daily., Disp: 90 tablet, Rfl: 1   oxymetazoline  (AFRIN NASAL SPRAY) 0.05 % nasal spray, Place 2 sprays into both nostrils 2 (two) times daily. Use no more than 3-5 days at a time., Disp: 30 mL, Rfl: 0   predniSONE  (DELTASONE ) 10 MG tablet, Take one tablet (10mg ) twice daily for six days days, then one tablet (10mg ) once daily for six days, then STOP., Disp: 18 tablet, Rfl: 0   verapamil  (CALAN -SR) 120 MG CR tablet, Take 1 tablet (120 mg total) by mouth daily., Disp: 90 tablet, Rfl: 1   tirzepatide  (ZEPBOUND ) 10 MG/0.5ML Pen, Inject 10 mg into the skin once a week., Disp: 2 mL, Rfl: 1   tirzepatide  (ZEPBOUND ) 12.5 MG/0.5ML Pen, Inject 12.5 mg into the skin once a week., Disp: 2 mL, Rfl: 3   Allergies  Allergen Reactions   Amoxicillin Hives    Hives       The patient states she uses none for birth control. No LMP recorded. Patient has had a hysterectomy.. Negative for Dysmenorrhea. Negative for: breast discharge, breast lump(s), breast pain and breast self exam. Associated symptoms include abnormal vaginal bleeding. Pertinent negatives include abnormal bleeding (hematology), anxiety, decreased libido, depression, difficulty falling sleep, dyspareunia, history of infertility, nocturia, sexual dysfunction, sleep disturbances, urinary incontinence, urinary urgency, vaginal discharge and vaginal itching. Diet regular.The patient states her exercise level is  moderate, until recently.   . The patient's tobacco use is:  Social History   Tobacco Use  Smoking  Status Never  Smokeless Tobacco Never  . She has been exposed to passive smoke. The patient's alcohol use is:  Social History   Substance and Sexual Activity  Alcohol Use Yes   Alcohol/week: 0.0 standard drinks of alcohol   Comment: socially - 1 drink every 3 months    Review of Systems  Constitutional: Negative.   HENT: Negative.    Eyes: Negative.  Negative for blurred vision.  Respiratory: Negative.    Cardiovascular: Negative.   Gastrointestinal: Negative.   Endocrine: Negative.   Genitourinary: Negative.   Musculoskeletal: Negative.   Skin: Negative.   Allergic/Immunologic: Negative.   Neurological: Negative.   Hematological: Negative.   Psychiatric/Behavioral: Negative.       Today's Vitals   06/25/24 1517  BP: 124/82  Pulse: 85  Temp: 98.7 F (37.1 C)  SpO2: 98%  Weight: 211 lb 6.4 oz (95.9 kg)  Height: 5' 4 (1.626 m)   Body mass index is 36.29 kg/m.  Wt Readings from Last 3 Encounters:  06/25/24 211 lb 6.4 oz (95.9 kg)  04/14/24 211 lb (95.7 kg)  03/05/24 217 lb 9.6 oz (98.7 kg)     Objective:  Physical Exam Vitals and nursing note reviewed.  Constitutional:      Appearance: Normal appearance. She is obese.  HENT:     Head: Normocephalic and  atraumatic.     Right Ear: Ear canal and external ear normal. There is impacted cerumen.     Left Ear: Tympanic membrane, ear canal and external ear normal.     Nose: Nose normal.     Mouth/Throat:     Mouth: Mucous membranes are moist.     Pharynx: Oropharynx is clear.  Eyes:     Extraocular Movements: Extraocular movements intact.     Conjunctiva/sclera: Conjunctivae normal.     Pupils: Pupils are equal, round, and reactive to light.  Cardiovascular:     Rate and Rhythm: Normal rate and regular rhythm.     Pulses: Normal pulses.     Heart sounds: Normal heart sounds.  Pulmonary:     Effort: Pulmonary effort is normal.     Breath sounds: Normal breath sounds.  Abdominal:     General: Abdomen is flat. Bowel sounds are normal.     Palpations: Abdomen is soft.  Genitourinary:    Comments: deferred Musculoskeletal:        General: Normal range of motion.     Cervical back: Normal range of motion and neck supple.  Skin:    General: Skin is warm and dry.  Neurological:     General: No focal deficit present.     Mental Status: She is alert and oriented to person, place, and time.  Psychiatric:        Mood and Affect: Mood normal.        Behavior: Behavior normal.         Assessment And Plan:     Encounter for general adult medical examination w/o abnormal findings Assessment & Plan: A full exam was performed. Importance of monthly self breast exams was discussed with the patient. PATIENT IS ADVISED TO GET 30-45 MINUTES REGULAR EXERCISE NO LESS THAN FOUR TO FIVE DAYS PER WEEK - BOTH WEIGHTBEARING EXERCISES AND AEROBIC ARE RECOMMENDED.  PATIENT IS ADVISED TO FOLLOW A HEALTHY DIET WITH AT LEAST SIX FRUITS/VEGGIES PER DAY, DECREASE INTAKE OF RED MEAT, AND TO INCREASE FISH INTAKE TO TWO DAYS PER WEEK.  MEATS/FISH SHOULD NOT BE FRIED, BAKED OR BROILED IS  PREFERABLE.  IT IS ALSO IMPORTANT TO CUT BACK ON YOUR SUGAR INTAKE. PLEASE AVOID ANYTHING WITH ADDED SUGAR, CORN SYRUP OR OTHER  SWEETENERS. IF YOU MUST USE A SWEETENER, YOU CAN TRY STEVIA. IT IS ALSO IMPORTANT TO AVOID ARTIFICIALLY SWEETENERS AND DIET BEVERAGES. LASTLY, I SUGGEST WEARING SPF 50 SUNSCREEN ON EXPOSED PARTS AND ESPECIALLY WHEN IN THE DIRECT SUNLIGHT FOR AN EXTENDED PERIOD OF TIME.  PLEASE AVOID FAST FOOD RESTAURANTS AND INCREASE YOUR WATER INTAKE.   Orders: -     CBC -     CMP14+EGFR -     Lipid panel -     TSH  Essential hypertension Assessment & Plan: Chronic, controlled.   EKG performed, NSR w/ poor R-wave progression, nonspecific T-abnormality and low voltage. No med changes today. She will continue with hydrochlorothiazide  12.5mg  daily, verapamil  120mcg CR daily and olmesartan  40mg  daily. She is reminded to follow low sodium diet. She will f/u in four months.   Orders: -     POCT URINALYSIS DIP (CLINITEK) -     Microalbumin / creatinine urine ratio -     EKG 12-Lead  Insulin  resistance Assessment & Plan: Previous labs reviewed, her A1c has been elevated in the past. I will check an A1c today. Reminded to avoid refined sugars including sugary drinks/foods and processed meats including bacon, sausages and deli meats.    Orders: -     Hemoglobin A1c  Impacted cerumen of right ear Assessment & Plan: AFTER OBTAINING VERBAL CONSENT, RIGHT EAR WAS FLUSHED BY IRRIGATION. SHE TOLERATED PROCEDURE WELL WITHOUT ANY COMPLICATIONS. NO TM ABNORMALITIES WERE NOTED.    Urinary urgency Assessment & Plan: She is advised to incorporate some pelvic floor exercises to her daily routine.  - consider pelvic floor PT in the future   Chronic bilateral low back pain without sciatica Assessment & Plan: Chronic low back pain likely due to core weakness. Plans to use weighted vest and perform strengthening exercises. - Encourage use of weighted vest during walks. - Recommend core strengthening exercises.   Class 2 severe obesity due to excess calories with serious comorbidity and body mass index (BMI) of 36.0  to 36.9 in adult [facility]) Assessment & Plan: She has not gained/lost any weight since June 2025.  She wishes to continue with tirzepatide  12.5mg  weekly to pharmacy. She will f/u in 8-10 weeks. Reminded to incorporate weight- training into her workout routine and to be intentional about her protein intake.    Other orders -     Zepbound ; Inject 12.5 mg into the skin once a week.  Dispense: 2 mL; Refill: 3   Return in 3 months (on 09/25/2024), or weight check, for 1 year physical, 6 month bp. Patient was given opportunity to ask questions. Patient verbalized understanding of the plan and was able to repeat key elements of the plan. All questions were answered to their satisfaction.   I, Catheryn LOISE Slocumb, MD, have reviewed all documentation for this visit. The documentation on 06/25/24 for the exam, diagnosis, procedures, and orders are all accurate and complete.

## 2024-06-26 ENCOUNTER — Ambulatory Visit (INDEPENDENT_AMBULATORY_CARE_PROVIDER_SITE_OTHER): Admitting: Podiatry

## 2024-06-26 ENCOUNTER — Other Ambulatory Visit (HOSPITAL_COMMUNITY): Payer: Self-pay

## 2024-06-26 ENCOUNTER — Encounter: Payer: Self-pay | Admitting: Podiatry

## 2024-06-26 DIAGNOSIS — M722 Plantar fascial fibromatosis: Secondary | ICD-10-CM | POA: Diagnosis not present

## 2024-06-26 MED ORDER — TRIAMCINOLONE ACETONIDE 10 MG/ML IJ SUSP
10.0000 mg | Freq: Once | INTRAMUSCULAR | Status: AC
Start: 2024-06-26 — End: 2024-06-26
  Administered 2024-06-26: 10 mg via INTRA_ARTICULAR

## 2024-06-26 NOTE — Progress Notes (Signed)
 Subjective:   Patient ID: Desiree Dunn, female   DOB: 53 y.o.   MRN: [id number]   HPI Patient states has developed a lot of pain in the right heel and states that it is hard for her to stretch it appropriately   ROS      Objective:  Physical Exam  Neurovascular status intact exquisite discomfort medial fascial band right at the insertional point tendon calcaneus with fluid buildup around the tendon itself and a tight plantar fascia noted     Assessment:  Acute plantar fasciitis right with inflammation     Plan:  H&P reviewed and at this point sterile prep and injected the plantar fascia right 3 mg Kenalog  5 mg Liken at insertion with sterile dressing applied and then dispensed night splint to stretch out the foot appropriately improperly fitted to the lower leg of a prefab material with a soft like interface

## 2024-06-27 LAB — HEMOGLOBIN A1C
Est. average glucose Bld gHb Est-mCnc: 108 mg/dL
Hgb A1c MFr Bld: 5.4 % (ref 4.8–5.6)

## 2024-06-27 LAB — CMP14+EGFR
ALT: 22 IU/L (ref 0–32)
AST: 23 IU/L (ref 0–40)
Albumin: 4.6 g/dL (ref 3.8–4.9)
Alkaline Phosphatase: 69 IU/L (ref 44–121)
BUN/Creatinine Ratio: 11 (ref 9–23)
BUN: 11 mg/dL (ref 6–24)
Bilirubin Total: 0.5 mg/dL (ref 0.0–1.2)
CO2: 25 mmol/L (ref 20–29)
Calcium: 9.6 mg/dL (ref 8.7–10.2)
Chloride: 101 mmol/L (ref 96–106)
Creatinine, Ser: 0.98 mg/dL (ref 0.57–1.00)
Globulin, Total: 2.7 g/dL (ref 1.5–4.5)
Glucose: 77 mg/dL (ref 70–99)
Potassium: 3.4 mmol/L — ABNORMAL LOW (ref 3.5–5.2)
Sodium: 141 mmol/L (ref 134–144)
Total Protein: 7.3 g/dL (ref 6.0–8.5)
eGFR: 69 mL/min/1.73 (ref >59)

## 2024-06-27 LAB — CBC
Hematocrit: 36.2 % (ref 34.0–46.6)
Hemoglobin: 12 g/dL (ref 11.1–15.9)
MCH: 29.9 pg (ref 26.6–33.0)
MCHC: 33.1 g/dL (ref 31.5–35.7)
MCV: 90 fL (ref 79–97)
Platelets: 231 x10E3/uL (ref 150–450)
RBC: 4.01 x10E6/uL (ref 3.77–5.28)
RDW: 13 % (ref 11.7–15.4)
WBC: 5.7 x10E3/uL (ref 3.4–10.8)

## 2024-06-27 LAB — TSH: TSH: 0.679 u[IU]/mL (ref 0.450–4.500)

## 2024-06-27 LAB — MICROALBUMIN / CREATININE URINE RATIO
Creatinine, Urine: 51.3 mg/dL
Microalb/Creat Ratio: <6 mg/g{creat} (ref 0–29)
Microalbumin, Urine: <3 ug/mL

## 2024-06-27 LAB — LIPID PANEL
Chol/HDL Ratio: 2.8 ratio (ref 0.0–4.4)
Cholesterol, Total: 226 mg/dL — ABNORMAL HIGH (ref 100–199)
HDL: 81 mg/dL (ref >39)
LDL Chol Calc (NIH): 128 mg/dL — ABNORMAL HIGH (ref 0–99)
Triglycerides: 96 mg/dL (ref 0–149)
VLDL Cholesterol Cal: 17 mg/dL (ref 5–40)

## 2024-06-28 DIAGNOSIS — H6121 Impacted cerumen, right ear: Secondary | ICD-10-CM | POA: Insufficient documentation

## 2024-06-28 DIAGNOSIS — R3915 Urgency of urination: Secondary | ICD-10-CM | POA: Insufficient documentation

## 2024-06-28 DIAGNOSIS — M545 Low back pain, unspecified: Secondary | ICD-10-CM | POA: Insufficient documentation

## 2024-06-28 NOTE — Assessment & Plan Note (Signed)
 She is advised to incorporate some pelvic floor exercises to her daily routine.  - consider pelvic floor PT in the future

## 2024-06-28 NOTE — Assessment & Plan Note (Signed)
 Chronic low back pain likely due to core weakness. Plans to use weighted vest and perform strengthening exercises. - Encourage use of weighted vest during walks. - Recommend core strengthening exercises.

## 2024-06-28 NOTE — Assessment & Plan Note (Signed)
 Chronic, controlled.   EKG performed, NSR w/ poor R-wave progression, nonspecific T-abnormality and low voltage. No med changes today. She will continue with hydrochlorothiazide  12.5mg  daily, verapamil  120mcg CR daily and olmesartan  40mg  daily. She is reminded to follow low sodium diet. She will f/u in four months.

## 2024-06-28 NOTE — Assessment & Plan Note (Signed)
 She has not gained/lost any weight since June 2025.  She wishes to continue with tirzepatide  12.5mg  weekly to pharmacy. She will f/u in 8-10 weeks. Reminded to incorporate weight- training into her workout routine and to be intentional about her protein intake.

## 2024-06-28 NOTE — Assessment & Plan Note (Signed)
AFTER OBTAINING VERBAL CONSENT, RIGHT EAR WAS FLUSHED BY IRRIGATION. SHE TOLERATED PROCEDURE WELL WITHOUT ANY COMPLICATIONS. NO TM ABNORMALITIES WERE NOTED.

## 2024-06-28 NOTE — Assessment & Plan Note (Signed)
 Previous labs reviewed, her A1c has been elevated in the past. I will check an A1c today. Reminded to avoid refined sugars including sugary drinks/foods and processed meats including bacon, sausages and deli meats.

## 2024-06-28 NOTE — Assessment & Plan Note (Signed)

## 2024-06-29 ENCOUNTER — Ambulatory Visit: Payer: Self-pay | Admitting: Internal Medicine

## 2024-06-30 NOTE — Addendum Note (Signed)
 Addended by: EMMITT FINE T on: 06/30/2024 03:31 PM   Modules accepted: Orders

## 2024-07-10 ENCOUNTER — Encounter: Payer: Self-pay | Admitting: Internal Medicine

## 2024-07-20 ENCOUNTER — Other Ambulatory Visit (HOSPITAL_COMMUNITY): Payer: Self-pay

## 2024-07-28 ENCOUNTER — Encounter: Payer: Self-pay | Admitting: Internal Medicine

## 2024-07-28 ENCOUNTER — Ambulatory Visit (INDEPENDENT_AMBULATORY_CARE_PROVIDER_SITE_OTHER): Admitting: Internal Medicine

## 2024-07-28 VITALS — BP 108/70 | HR 85 | Temp 98.3°F | Ht 64.0 in | Wt 204.6 lb

## 2024-07-28 DIAGNOSIS — H9202 Otalgia, left ear: Secondary | ICD-10-CM

## 2024-07-28 DIAGNOSIS — Z23 Encounter for immunization: Secondary | ICD-10-CM

## 2024-07-28 NOTE — Patient Instructions (Signed)
Earache, Adult An earache, or ear pain, can be caused by many things, including: An infection. Ear wax buildup. Ear pressure. Something in the ear that should not be there (foreign body). A sore throat. Tooth problems. Jaw problems. Treatment of the earache will depend on the cause. If the cause is not clear or cannot be known, you may need to watch your symptoms until your earache goes away or until a cause is found. Follow these instructions at home: Medicines Take or apply over-the-counter and prescription medicines only as told by your health care provider. If you were prescribed antibiotics, use them as told by your health care provider. Do not stop using the antibiotic even if you start to feel better. Do not put anything in your ear other than medicine that is prescribed by your health care provider. Managing pain     If directed, apply heat to the affected area as often as told by your health care provider. Use the heat source that your health care provider recommends, such as a moist heat pack or a heating pad. Place a towel between your skin and the heat source. Leave the heat on for 20-30 minutes. If your skin turns bright red, remove the heat right away to prevent burns. The risk of burns is higher if you cannot feel pain, heat, or cold. If directed, put ice on the affected area. To do this: Put ice in a plastic bag. Place a towel between your skin and the bag. Leave the ice on for 20 minutes, 2-3 times a day. If your skin turns bright red, remove the ice right away to prevent skin damage. The risk of skin damage is higher if you cannot feel pain, heat, or cold.  General instructions Pay attention to any changes in your symptoms. Try resting in an upright position instead of lying down. This may help to reduce pressure in your ear and relieve pain. Chew gum if it helps to relieve your ear pain. Treat any allergies as told by your health care provider. Drink enough fluid  to keep your urine pale yellow. It is up to you to get the results of any tests that were done. Ask your health care provider, or the department that is doing the tests, when your results will be ready. Contact a health care provider if: Your pain does not improve within 2 days. Your earache gets worse. You have new symptoms. You have a fever. Get help right away if: You have a severe headache. You have a stiff neck. You have trouble swallowing. You have redness or swelling behind your ear. You have fluid or blood coming from your ear. You have hearing loss. You feel dizzy. This information is not intended to replace advice given to you by your health care provider. Make sure you discuss any questions you have with your health care provider. Document Revised: 03/12/2022 Document Reviewed: 03/12/2022 [employer] Patient Education  2024 [employer]  

## 2024-07-28 NOTE — Progress Notes (Signed)
 I,Victoria T Emmitt, CMA,acting as a Neurosurgeon for Catheryn LOISE Slocumb, MD.,have documented all relevant documentation on the behalf of Catheryn LOISE Slocumb, MD,as directed by  Catheryn LOISE Slocumb, MD while in the presence of Catheryn LOISE Slocumb, MD.  Subjective:  Patient ID: Desiree Dunn , female    DOB: 09-12-1971 , 53 y.o.   MRN: [id number]  Chief Complaint  Patient presents with   Left ear pain     Patient presents today for left ear pain. She also cannot hear out of right ear currently. Left ear pain started Saturday. She states having ear wax kit at home using peroxide & water which made the pain worse. She also has taken ibuprofen  which helped a little. She also has taken routine allergy  medications.     HPI Discussed the use of AI scribe software for clinical note transcription with the patient, who gave verbal consent to proceed.  History of Present Illness Desiree Dunn is a 53 year old female who presents with left ear pain and hearing loss.  She has been experiencing left ear pain and hearing loss since Saturday, described as a sensation of being 'in a tunnel', exacerbated by chewing. She attempted to alleviate the symptoms using an ear wax kit with peroxide, but the pain persisted.  She denies fever, chills, sore throat, and pain with swallowing. She has been managing the pain with ibuprofen  and Tylenol , although she tries to limit her ibuprofen  intake. Her daughter suggested she might need an anti-inflammatory medication.  She takes Allegra  in the morning and Xyzal  at night for allergies.  She denies known ill contacts.   Past Medical History:  Diagnosis Date   Allergy     Anxiety    Back pain    Chronic RLQ pain    since 1995 when she had ectopic preg   Constipation    Deviated septum    Diverticulitis    Ectopic pregnancy 1995   x 1 , R side    Eczema    GERD (gastroesophageal reflux disease)    HTN (hypertension) 05/24/2015   Migraine without aura and without  status migrainosus, not intractable    Miscarriage    x 1   Obesity, unspecified 07/06/2010   OSA (obstructive sleep apnea)    Pelvic congestion    h/o    Sleep apnea    uses cpap     Family History  Problem Relation Age of Onset   Colon cancer Mother 7   Colon cancer Maternal Aunt 50   Stroke Father    Diabetes Father    Hypertension Father    Hyperlipidemia Father    Heart disease Father    Obesity Father    Hypertension Sister    Colon polyps Brother    Colitis Brother    Depression Son    Asperger's syndrome Son    Breast cancer Paternal Grandmother    Prostate cancer Brother    Heart attack Neg Hx    Esophageal cancer Neg Hx    Rectal cancer Neg Hx    Stomach cancer Neg Hx      Current Outpatient Medications:    acetaminophen  (TYLENOL ) 500 MG tablet, Take 1,000 mg by mouth as needed for mild pain or headache., Disp: , Rfl:    albuterol  (VENTOLIN  HFA) 108 (90 Base) MCG/ACT inhaler, INHALE 2 PUFFS BY MOUTH EVERY 4 HOURS AS NEEDED, Disp: 6.7 g, Rfl: 2   Azelastine -Fluticasone  (DYMISTA ) 137-50 MCG/ACT SUSP, Place 2 sprays into both nostrils daily.,  Disp: 23 g, Rfl: 5   cyclobenzaprine  (FLEXERIL ) 10 MG tablet, TAKE 1 TABLET BY MOUTH AT BEDTIME AS NEEDED FOR MUSCLE SPASMS, Disp: 20 tablet, Rfl: 0   EPINEPHrine  (EPIPEN  2-PAK) 0.3 mg/0.3 mL IJ SOAJ injection, Inject 0.3 mg into the muscle as needed for anaphylaxis., Disp: 2 each, Rfl: 2   estradiol  (ESTRACE ) 0.1 MG/GM vaginal cream, Place 1 g vaginally every evening for 2 weeks, then twice weekly, Disp: 42.5 g, Rfl: 1   hydrochlorothiazide  (MICROZIDE ) 12.5 MG capsule, Take 1 capsule (12.5 mg total) by mouth daily., Disp: 90 capsule, Rfl: 3   hydrocortisone  2.5 % cream, Apply to affected areas 2 (two) times daily as directed., Disp: 454 g, Rfl: 0   olmesartan  (BENICAR ) 40 MG tablet, Take 1 tablet (40 mg total) by mouth daily., Disp: 90 tablet, Rfl: 1   oxymetazoline  (AFRIN NASAL SPRAY) 0.05 % nasal spray, Place 2 sprays into  both nostrils 2 (two) times daily. Use no more than 3-5 days at a time., Disp: 30 mL, Rfl: 0   tirzepatide  (ZEPBOUND ) 10 MG/0.5ML Pen, Inject 10 mg into the skin once a week., Disp: 2 mL, Rfl: 1   tirzepatide  (ZEPBOUND ) 12.5 MG/0.5ML Pen, Inject 12.5 mg into the skin once a week., Disp: 2 mL, Rfl: 3   verapamil  (CALAN -SR) 120 MG CR tablet, Take 1 tablet (120 mg total) by mouth daily., Disp: 90 tablet, Rfl: 1   Chlorphen-PE-Acetaminophen  (NOREL AD) 4-10-325 MG TABS, Take 1 tablet by mouth 2 (two) times daily as needed, Disp: 20 tablet, Rfl: 1   Allergies  Allergen Reactions   Amoxicillin Hives    Hives     Review of Systems  Constitutional: Negative.   HENT:  Positive for ear pain. Negative for congestion.   Respiratory: Negative.    Cardiovascular: Negative.   Gastrointestinal: Negative.   Neurological: Negative.   Psychiatric/Behavioral: Negative.       Today's Vitals   07/28/24 0829  BP: 108/70  Pulse: 85  Temp: 98.3 F (36.8 C)  SpO2: 98%  Weight: 204 lb 9.6 oz (92.8 kg)  Height: 5' 4 (1.626 m)   Body mass index is 35.12 kg/m.  Wt Readings from Last 3 Encounters:  07/28/24 204 lb 9.6 oz (92.8 kg)  06/25/24 211 lb 6.4 oz (95.9 kg)  04/14/24 211 lb (95.7 kg)     Objective:  Physical Exam Vitals and nursing note reviewed.  Constitutional:      Appearance: Normal appearance.  HENT:     Head: Normocephalic and atraumatic.     Right Ear: Ear canal and external ear normal.     Left Ear: Ear canal and external ear normal.     Ears:     Comments: Excess cerumen on right, not impacted Eyes:     Extraocular Movements: Extraocular movements intact.  Cardiovascular:     Rate and Rhythm: Normal rate and regular rhythm.     Heart sounds: Normal heart sounds.  Pulmonary:     Effort: Pulmonary effort is normal.     Breath sounds: Normal breath sounds.  Musculoskeletal:     Cervical back: Normal range of motion.  Skin:    General: Skin is warm.  Neurological:      General: No focal deficit present.     Mental Status: She is alert.  Psychiatric:        Mood and Affect: Mood normal.        Behavior: Behavior normal.  Assessment And Plan:  Left ear pain Assessment & Plan: No infection present. Antibiotics are not indicated - Prescribed Norel AD twice daily as a decongestant. - Advised against dairy products to prevent congestion. - Encouraged hot tea with Manuka honey to thin mucus. - Sent prescription to pharmacy.    Return if symptoms worsen or fail to improve.  Patient was given opportunity to ask questions. Patient verbalized understanding of the plan and was able to repeat key elements of the plan. All questions were answered to their satisfaction.   I, Catheryn LOISE Slocumb, MD, have reviewed all documentation for this visit. The documentation on 07/28/24 for the exam, diagnosis, procedures, and orders are all accurate and complete.   IF YOU HAVE BEEN REFERRED TO A SPECIALIST, IT MAY TAKE 1-2 WEEKS TO SCHEDULE/PROCESS THE REFERRAL. IF YOU HAVE NOT HEARD FROM US /SPECIALIST IN TWO WEEKS, PLEASE GIVE US  A CALL AT [phone].   THE PATIENT IS ENCOURAGED TO PRACTICE SOCIAL DISTANCING DUE TO THE COVID-19 PANDEMIC.

## 2024-07-30 ENCOUNTER — Encounter: Payer: Self-pay | Admitting: Internal Medicine

## 2024-08-04 ENCOUNTER — Ambulatory Visit

## 2024-08-05 ENCOUNTER — Telehealth: Payer: Self-pay | Admitting: Adult Health

## 2024-08-05 NOTE — Telephone Encounter (Signed)
 [facility] cancellation

## 2024-08-06 ENCOUNTER — Other Ambulatory Visit (HOSPITAL_COMMUNITY): Payer: Self-pay

## 2024-08-06 ENCOUNTER — Encounter: Admitting: Internal Medicine

## 2024-08-06 ENCOUNTER — Other Ambulatory Visit: Payer: Self-pay | Admitting: Internal Medicine

## 2024-08-06 ENCOUNTER — Other Ambulatory Visit: Payer: Self-pay

## 2024-08-06 MED ORDER — NOREL AD 4-10-325 MG PO TABS
1.0000 | ORAL_TABLET | Freq: Two times a day (BID) | ORAL | 1 refills | Status: AC
  Filled 2024-08-06: qty 20, 10d supply, fill #0
  Filled 2024-09-04 – 2024-10-19 (×2): qty 20, 10d supply, fill #1

## 2024-08-07 DIAGNOSIS — H9202 Otalgia, left ear: Secondary | ICD-10-CM | POA: Insufficient documentation

## 2024-08-07 NOTE — Assessment & Plan Note (Addendum)
 No infection present. Antibiotics are not indicated - Prescribed Norel AD twice daily as a decongestant. - Advised against dairy products to prevent congestion. - Encouraged hot tea with Manuka honey to thin mucus. - Sent prescription to pharmacy.

## 2024-08-11 ENCOUNTER — Ambulatory Visit (INDEPENDENT_AMBULATORY_CARE_PROVIDER_SITE_OTHER)

## 2024-08-11 VITALS — BP 120/82 | HR 94 | Temp 98.2°F | Ht 64.0 in | Wt 209.0 lb

## 2024-08-11 DIAGNOSIS — Z23 Encounter for immunization: Secondary | ICD-10-CM | POA: Diagnosis not present

## 2024-08-11 NOTE — Progress Notes (Signed)
 Patient presents today for a flu vaccine. Patient received her flu vaccine in her right deltoid. Patient tolerated injection well. YL,RMA

## 2024-08-13 ENCOUNTER — Other Ambulatory Visit (HOSPITAL_COMMUNITY): Payer: Self-pay

## 2024-08-13 ENCOUNTER — Other Ambulatory Visit: Payer: Self-pay

## 2024-08-13 ENCOUNTER — Other Ambulatory Visit: Payer: Self-pay | Admitting: Allergy

## 2024-08-13 MED ORDER — ALBUTEROL SULFATE HFA 108 (90 BASE) MCG/ACT IN AERS
2.0000 | INHALATION_SPRAY | RESPIRATORY_TRACT | 2 refills | Status: DC | PRN
  Filled 2024-08-13: qty 6.7, 16d supply, fill #0

## 2024-09-01 ENCOUNTER — Telehealth: Payer: Managed Care, Other (non HMO) | Admitting: Adult Health

## 2024-09-04 ENCOUNTER — Other Ambulatory Visit: Payer: Self-pay

## 2024-09-04 ENCOUNTER — Encounter: Payer: Self-pay | Admitting: Allergy

## 2024-09-04 ENCOUNTER — Other Ambulatory Visit (HOSPITAL_COMMUNITY): Payer: Self-pay

## 2024-09-04 ENCOUNTER — Ambulatory Visit (INDEPENDENT_AMBULATORY_CARE_PROVIDER_SITE_OTHER): Admitting: Allergy

## 2024-09-04 VITALS — BP 122/98 | HR 84 | Temp 98.5°F | Resp 16 | Ht 64.0 in | Wt 202.6 lb

## 2024-09-04 DIAGNOSIS — H1013 Acute atopic conjunctivitis, bilateral: Secondary | ICD-10-CM

## 2024-09-04 DIAGNOSIS — J45990 Exercise induced bronchospasm: Secondary | ICD-10-CM

## 2024-09-04 DIAGNOSIS — L2489 Irritant contact dermatitis due to other agents: Secondary | ICD-10-CM

## 2024-09-04 DIAGNOSIS — J3089 Other allergic rhinitis: Secondary | ICD-10-CM | POA: Diagnosis not present

## 2024-09-04 MED ORDER — RYALTRIS 665-25 MCG/ACT NA SUSP: 2.0000 | Freq: Two times a day (BID) | NASAL | 2 refills | Status: AC | PRN

## 2024-09-04 MED ORDER — ALBUTEROL SULFATE HFA 108 (90 BASE) MCG/ACT IN AERS
2.0000 | INHALATION_SPRAY | RESPIRATORY_TRACT | 2 refills | Status: AC | PRN
  Filled 2024-09-04 – 2024-09-06 (×2): qty 6.7, 16d supply, fill #0
  Filled 2024-10-07: qty 6.7, 16d supply, fill #1

## 2024-09-04 MED ORDER — HYDROXYZINE HCL 25 MG PO TABS
25.0000 mg | ORAL_TABLET | Freq: Every evening | ORAL | 1 refills | Status: AC | PRN
  Filled 2024-09-04 – 2024-10-19 (×2): qty 30, 30d supply, fill #0

## 2024-09-04 MED ORDER — HYDROCORTISONE 2.5 % EX CREA
TOPICAL_CREAM | Freq: Two times a day (BID) | CUTANEOUS | 2 refills | Status: AC
  Filled 2024-09-04: qty 454, 60d supply, fill #0

## 2024-09-04 MED ORDER — FEXOFENADINE HCL 180 MG PO TABS
180.0000 mg | ORAL_TABLET | Freq: Every day | ORAL | 1 refills | Status: AC
  Filled 2024-09-04 – 2024-10-07 (×2): qty 90, 90d supply, fill #0

## 2024-09-04 MED ORDER — LEVOCETIRIZINE DIHYDROCHLORIDE 5 MG PO TABS
5.0000 mg | ORAL_TABLET | Freq: Every evening | ORAL | 1 refills | Status: AC
  Filled 2024-09-04 – 2024-10-07 (×2): qty 90, 90d supply, fill #0

## 2024-09-04 MED ORDER — MONTELUKAST SODIUM 10 MG PO TABS
10.0000 mg | ORAL_TABLET | Freq: Every day | ORAL | 1 refills | Status: AC
  Filled 2024-09-04 – 2024-10-19 (×2): qty 90, 90d supply, fill #0

## 2024-09-04 MED FILL — Hydrochlorothiazide Cap 12.5 MG: ORAL | 90 days supply | Qty: 90 | Fill #3 | Status: AC

## 2024-09-04 NOTE — Progress Notes (Signed)
 Follow-up Note  RE: Hanaan Gancarz MRN: [id number] DOB: [birth year] Date of Office Visit: 09/04/2024   History of present illness: Laqueisha Catalina is a 53 y.o. female presenting today for rash.  She was last seen in the office on 04/14/2024 by Dr. Iva for dermatitis secondary to bird mite exposure.  She has history of allergic rhinitis with conjunctivitis as well as exercise-induced bronchospasm. Discussed the use of AI scribe software for clinical note transcription with the patient, who gave verbal consent to proceed.  She had a itchy rash recently.  She suspects may be due to a new body wash scent she used.  That was the only new thing she had to use was this body wash.  The rash was widespread, affecting her arms, chest, legs, and the back of her neck. Initially, the rash was red and inflamed, and it has now left darker areas on her skin. For this rash, she used Calahist, A&D ointment, and hydrocortisone  cream, which provided some relief. She also took hydroxyzine  tablets at night to help with itching, as her husband noted she was scratching in her sleep. She has been using Dove Sensitive body wash since without issue.  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 Her home was treated for bird mites, and measures were taken to prevent re-entry of birds.  She saw Dr. Iva at the last visit in regards to this rash.  She has a history of allergies, including itchy and watery eyes, managed with Singulair , Allegra  in the morning, Xyzal  at night, and occasionally Norel AD. She also uses Ryaltris  nasal spray and saline mist regularly. Her allergies have been more challenging this year due to unusual pollen and mold levels.  She has not needed to use her inhaler frequently.    Review of systems: 10pt  ROS negative unless noted above in HPI  Past  medical/social/surgical/family history have been reviewed and are unchanged unless specifically indicated below.  No changes  Medication List: Current Outpatient Medications  Medication Sig Dispense Refill   acetaminophen  (TYLENOL ) 500 MG tablet Take 1,000 mg by mouth as needed for mild pain or headache.     albuterol  (VENTOLIN  HFA) 108 (90 Base) MCG/ACT inhaler Inhale 2 puffs into the lungs every 4 (four) hours as needed. 6.7 g 2   Azelastine -Fluticasone  (DYMISTA ) 137-50 MCG/ACT SUSP Place 2 sprays into both nostrils daily. 23 g 5   Chlorphen-PE-Acetaminophen  (NOREL AD) 4-10-325 MG TABS Take 1 tablet by mouth 2 (two) times daily as needed 20 tablet 1   cyclobenzaprine  (FLEXERIL ) 10 MG tablet TAKE 1 TABLET BY MOUTH AT BEDTIME AS NEEDED FOR MUSCLE SPASMS 20 tablet 0   EPINEPHrine  (EPIPEN  2-PAK) 0.3 mg/0.3 mL IJ SOAJ injection Inject 0.3 mg into the muscle as needed for anaphylaxis. 2 each 2   estradiol  (ESTRACE ) 0.1 MG/GM vaginal cream Place 1 g vaginally every evening for 2 weeks, then twice weekly 42.5 g 1   hydrochlorothiazide  (MICROZIDE ) 12.5 MG capsule Take 1 capsule (12.5 mg total) by mouth daily. 90 capsule 3   hydrocortisone  2.5 % cream Apply to affected areas 2 (two) times daily as directed. 454 g 0   olmesartan  (BENICAR ) 40 MG tablet Take 1 tablet (40 mg total) by mouth daily. 90 tablet 1   oxymetazoline  (AFRIN NASAL SPRAY) 0.05 % nasal spray Place 2 sprays into both nostrils 2 (two) times daily. Use no more than 3-5 days at a  time. 30 mL 0   tirzepatide  (ZEPBOUND ) 12.5 MG/0.5ML Pen Inject 12.5 mg into the skin once a week. 2 mL 3   verapamil  (CALAN -SR) 120 MG CR tablet Take 1 tablet (120 mg total) by mouth daily. 90 tablet 1   tirzepatide  (ZEPBOUND ) 10 MG/0.5ML Pen Inject 10 mg into the skin once a week. (Patient not taking: Reported on 09/04/2024) 2 mL 1   No current facility-administered medications for this visit.     Known medication allergies: Allergies  Allergen Reactions    Amoxicillin Hives    Hives     Physical examination: Blood pressure (!) 122/98, pulse 84, temperature 98.5 F (36.9 C), temperature source Temporal, resp. rate 16, height 5' 4 (1.626 m), weight 202 lb 9.6 oz (91.9 kg), SpO2 98%.  General: Alert, interactive, in no acute distress. HEENT: PERRLA, TMs pearly gray, turbinates minimally edematous without discharge, post-pharynx non erythematous. Neck: Supple without lymphadenopathy. Lungs: Clear to auscultation without wheezing, rhonchi or rales. {no increased work of breathing. CV: Normal S1, S2 without murmurs. Abdomen: Nondistended, nontender. Skin: Arms bilaterally upper chest wall with hyperpigmented patches. Extremities:  No clubbing, cyanosis or edema. Neuro:   Grossly intact.  Diagnostics/Labs: None today  Assessment and plan: Dermatitis -Previous history of dermatitis secondary to bird mite exposures.  This has been rectified. -Recent dermatitis likely secondary to contact allergy  to body wash -Patch testing is the test of choice to evaluate for contact dermatitis.  Recommend performing patch testing with the NAC80 patch panels.  Patches are best placed on a Monday with return to office on Wednesday and Friday of same week for readings.  Once patches are in place to do not get them wet.  You can take antihistamines while patches are in place.  We do have the capabilities of patch sting personal product so if you have access to the body wash of concern you can bring that on the day of the patch testing to apply that as well.  If you require steroid take prednisone  or steroid injection will need to wait at least 4 weeks before performing patch testing. - If rash redevelops then can use hydroxyzine  25mg  at bedtime as needed for itch control. - If rash will develop to use hydrocortisone  2.5% cream twice daily as needed.  Allergic rhinitis with conjunctivitis  - continue avoidance measures for tree pollens, weed, pollens, grass  pollens, molds, dust mite, dog and cockroach.  - continue antihistamine regimen Allegra /Zyrtec in AM and Xyzal  5mg  in PM.  - continue Singulair  10mg  daily at bedtime.  Ok to take with Xyzal  and take daily during fall weed pollen season for best effect.     - use Ryaltris  2 sprays each nostril twice a day as needed for runny or stuffy nose.  This will replace Dymista  if covered (let us  know if not covered and will try for Dymista )  - with using nasal sprays point tip of bottle toward eye on same side nostril and lean head slightly forward for best technique.    - continue use of nasal saline rinse to help flush and clean the nose.  Use prior to medicated nasal spray use.  Use only distilled water or boil water and bring to room/lukewarm temperature prior to use.   - for itchy/watery/red eyes use Pazeo 1 drop each eye daily as needed.    - use your dry eye drop or rewetting drop to help keep eyes moisturized  - if schedule allows in future you remain eligible to  resume allergen immunotherapy to help control allergies and lessen your reactivity and medication needs  Exercise induced asthma  - have access to albuterol  inhaler 2 puffs every 4-6 hours as needed for cough/wheeze/shortness of breath/chest tightness.  May use 15-20 minutes prior to activity.   Monitor frequency of use.     Follow-up 6-12 months or sooner if needed  I appreciate the opportunity to take part in Makeba's care. Please do not hesitate to contact me with questions.  Sincerely,   Danita Brain, MD Allergy /Immunology [facility]

## 2024-09-04 NOTE — Patient Instructions (Addendum)
 Dermatitis -Previous history of dermatitis secondary to bird mite exposures.  This has been rectified. -Recent dermatitis likely secondary to contact allergy  to body wash -Patch testing is the test of choice to evaluate for contact dermatitis.  Recommend performing patch testing with the NAC80 patch panels.  Patches are best placed on a Monday with return to office on Wednesday and Friday of same week for readings.  Once patches are in place to do not get them wet.  You can take antihistamines while patches are in place.  If you require steroid take prednisone  or steroid injection will need to wait at least 4 weeks before performing patch testing.  We do have the capabilities of patch sting personal product so if you have access to the body wash of concern you can bring that on the day of the patch testing to apply that as well. - If rash redevelops then can use hydroxyzine  25mg  at bedtime as needed for itch control. - If rash will develop to use hydrocortisone  2.5% cream twice daily as needed.  Allergic rhinitis with conjunctivitis  - continue avoidance measures for tree pollens, weed, pollens, grass pollens, molds, dust mite, dog and cockroach.  - continue antihistamine regimen Allegra /Zyrtec in AM and Xyzal  5mg  in PM.  - continue Singulair  10mg  daily at bedtime.  Ok to take with Xyzal  and take daily during fall weed pollen season for best effect.     - use Ryaltris  2 sprays each nostril twice a day as needed for runny or stuffy nose.  This will replace Dymista  if covered (let us  know if not covered and will try for Dymista )  - with using nasal sprays point tip of bottle toward eye on same side nostril and lean head slightly forward for best technique.    - continue use of nasal saline rinse to help flush and clean the nose.  Use prior to medicated nasal spray use.  Use only distilled water or boil water and bring to room/lukewarm temperature prior to use.   - for itchy/watery/red eyes use Pazeo 1  drop each eye daily as needed.    - use your dry eye drop or rewetting drop to help keep eyes moisturized  - if schedule allows in future you remain eligible to resume allergen immunotherapy to help control allergies and lessen your reactivity and medication needs  Exercise induced asthma  - have access to albuterol  inhaler 2 puffs every 4-6 hours as needed for cough/wheeze/shortness of breath/chest tightness.  May use 15-20 minutes prior to activity.   Monitor frequency of use.     Follow-up 6-12 months or sooner if needed

## 2024-09-05 ENCOUNTER — Other Ambulatory Visit (HOSPITAL_COMMUNITY): Payer: Self-pay

## 2024-09-06 ENCOUNTER — Other Ambulatory Visit (HOSPITAL_COMMUNITY): Payer: Self-pay

## 2024-09-07 ENCOUNTER — Other Ambulatory Visit (HOSPITAL_COMMUNITY): Payer: Self-pay

## 2024-09-07 ENCOUNTER — Other Ambulatory Visit: Payer: Self-pay

## 2024-09-13 ENCOUNTER — Other Ambulatory Visit (HOSPITAL_COMMUNITY): Payer: Self-pay

## 2024-09-14 ENCOUNTER — Other Ambulatory Visit (HOSPITAL_COMMUNITY): Payer: Self-pay

## 2024-09-16 ENCOUNTER — Telehealth: Admitting: Adult Health

## 2024-09-16 DIAGNOSIS — G4733 Obstructive sleep apnea (adult) (pediatric): Secondary | ICD-10-CM | POA: Diagnosis not present

## 2024-09-16 NOTE — Progress Notes (Signed)
 PATIENT: Desiree Dunn DOB: [birth year]  REASON FOR VISIT: follow up HISTORY FROM: patient PRIMARY NEUROLOGIST:   Virtual Visit via Video Note  I connected with Desiree Dunn on 09/16/24 at  9:30 AM EST by a video enabled telemedicine application located remotely at [facility] and verified that I am speaking with the correct person using two identifiers who was located at their job in [place].    I discussed the limitations of evaluation and management by telemedicine and the availability of in person appointments. The patient expressed understanding and agreed to proceed.   PATIENT: Desiree Dunn DOB: [birth year]  REASON FOR VISIT: follow up HISTORY FROM: patient  HISTORY OF PRESENT ILLNESS: Today 09/16/24:  Desiree Dunn is a 53 y.o. female with a history of obstructive sleep apnea on CPAP. Returns today for follow-up.  Reports that CPAP is working well for her.  She does have congestion and if it is severe she is unable to use her CPAP.  Currently has the nasal mask.  She is open to trying a different style of mask.  Her download is below     12/31/23: Desiree Dunn is a 54 y.o. female with a history of OSA on CPAP. Returns today for follow-up.  Reports that she has not been using the CPAP as much due to nasal congestion.  She currently has a nasal mask.  She states when she feels congested she is unable to use it.  She is asking about a dental device.  Her download is below.     12/25/22: Desiree Dunn is a 53 y.o. female with a history of OSA on CPAP. Returns today for follow-up.  She reports that the CPAP is working well for her.  She does state that she is congested and has not been able to use the CPAP in the last couple nights.  Her download is below       REVIEW OF SYSTEMS: Out of a complete 14 system review of symptoms, the patient complains only of the following symptoms, and all other reviewed  systems are negative.  ALLERGIES: Allergies  Allergen Reactions   Amoxicillin Hives    Hives    HOME MEDICATIONS: Outpatient Medications Prior to Visit  Medication Sig Dispense Refill   acetaminophen  (TYLENOL ) 500 MG tablet Take 1,000 mg by mouth as needed for mild pain or headache.     albuterol  (VENTOLIN  HFA) 108 (90 Base) MCG/ACT inhaler Inhale 2 puffs into the lungs every 4 (four) hours as needed. 6.7 g 2   Azelastine -Fluticasone  (DYMISTA ) 137-50 MCG/ACT SUSP Place 2 sprays into both nostrils daily. 23 g 5   Chlorphen-PE-Acetaminophen  (NOREL AD) 4-10-325 MG TABS Take 1 tablet by mouth 2 (two) times daily as needed 20 tablet 1   cyclobenzaprine  (FLEXERIL ) 10 MG tablet TAKE 1 TABLET BY MOUTH AT BEDTIME AS NEEDED FOR MUSCLE SPASMS 20 tablet 0   EPINEPHrine  (EPIPEN  2-PAK) 0.3 mg/0.3 mL IJ SOAJ injection Inject 0.3 mg into the muscle as needed for anaphylaxis. 2 each 2   estradiol  (ESTRACE ) 0.1 MG/GM vaginal cream Place 1 g vaginally every evening for 2 weeks, then twice weekly 42.5 g 1   fexofenadine  (ALLEGRA  ALLERGY ) 180 MG tablet Take 1 tablet (180 mg total) by mouth daily. 90 tablet 1   hydrochlorothiazide  (MICROZIDE ) 12.5 MG capsule Take 1 capsule (12.5 mg total) by mouth daily. 90 capsule 3   hydrocortisone  2.5 % cream Apply to affected areas 2 (two) times daily as directed. 454 g 2  hydrOXYzine  (ATARAX ) 25 MG tablet Take 1 tablet (25 mg total) by mouth at bedtime as needed for itching. 30 tablet 1   levocetirizine (XYZAL ) 5 MG tablet Take 1 tablet (5 mg total) by mouth every evening. double to two tablets in the morning for TWO WEEKS. 90 tablet 1   montelukast  (SINGULAIR ) 10 MG tablet Take 1 tablet (10 mg total) by mouth at bedtime. 90 tablet 1   olmesartan  (BENICAR ) 40 MG tablet Take 1 tablet (40 mg total) by mouth daily. 90 tablet 1   Olopatadine-Mometasone  (RYALTRIS ) 665-25 MCG/ACT SUSP Place 2 sprays into the nose 2 (two) times daily as needed (for runny nose of stuffy nose). 29  g 2   oxymetazoline  (AFRIN NASAL SPRAY) 0.05 % nasal spray Place 2 sprays into both nostrils 2 (two) times daily. Use no more than 3-5 days at a time. 30 mL 0   tirzepatide  (ZEPBOUND ) 10 MG/0.5ML Pen Inject 10 mg into the skin once a week. (Patient not taking: Reported on 09/04/2024) 2 mL 1   tirzepatide  (ZEPBOUND ) 12.5 MG/0.5ML Pen Inject 12.5 mg into the skin once a week. 2 mL 3   verapamil  (CALAN -SR) 120 MG CR tablet Take 1 tablet (120 mg total) by mouth daily. 90 tablet 1   No facility-administered medications prior to visit.    PAST MEDICAL HISTORY: Past Medical History:  Diagnosis Date   Allergy     Anxiety    Back pain    Chronic RLQ pain    since 1995 when she had ectopic preg   Constipation    Deviated septum    Diverticulitis    Ectopic pregnancy 1995   x 1 , R side    Eczema    GERD (gastroesophageal reflux disease)    HTN (hypertension) 05/24/2015   Migraine without aura and without status migrainosus, not intractable    Miscarriage    x 1   Obesity, unspecified 07/06/2010   OSA (obstructive sleep apnea)    Pelvic congestion    h/o    Sleep apnea    uses cpap    PAST SURGICAL HISTORY: Past Surgical History:  Procedure Laterality Date   ABDOMINAL HYSTERECTOMY  12/2009   [place]   BREAST REDUCTION SURGERY  10/2008   COLONOSCOPY  2001   X3 ; diverticulosis   FOOT SURGERY     NASAL SEPTOPLASTY W/ TURBINOPLASTY Bilateral 10/31/2018   Procedure: NASAL SEPTOPLASTY WITH TURBINATE REDUCTION;  Surgeon: Mable Lenis, MD;  Location: [facility] OR;  Service: ENT;  Laterality: Bilateral;   PARTIAL HYSTERECTOMY     POLYPECTOMY     HPP    REDUCTION MAMMAPLASTY Bilateral 10/2008   RIGHT OOPHORECTOMY  12/2009   [place]   TRIGGER FINGER RELEASE Right 04/28/2021   middle finger   TUBAL LIGATION  2000    FAMILY HISTORY: Family History  Problem Relation Age of Onset   Colon cancer Mother 25   Colon cancer Maternal Aunt 62   Stroke Father    Diabetes Father     Hypertension Father    Hyperlipidemia Father    Heart disease Father    Obesity Father    Hypertension Sister    Colon polyps Brother    Colitis Brother    Depression Son    Asperger's syndrome Son    Breast cancer Paternal Grandmother    Prostate cancer Brother    Heart attack Neg Hx    Esophageal cancer Neg Hx    Rectal cancer Neg Hx  Stomach cancer Neg Hx     SOCIAL HISTORY: Social History   Socioeconomic History   Marital status: Married    Spouse name: Charles   Number of children: 2   Years of education: Not on file   Highest education level: Master's degree (e.g., MA, MS, MEng, MEd, MSW, MBA)  Occupational History   Occupation: Technical Brewer, Par time   Occupation: works full time at [employer]    Employer: [employer]  Tobacco Use   Smoking status: Never   Smokeless tobacco: Never  Vaping Use   Vaping status: Never Used  Substance and Sexual Activity   Alcohol use: Yes    Alcohol/week: 0.0 standard drinks of alcohol    Comment: socially - 1 drink every 3 months   Drug use: No   Sexual activity: Yes    Partners: Male    Birth control/protection: Surgical  Other Topics Concern   Not on file  Social History Narrative   Household-- pt , husband and children   daughter 75   son 30   Social Drivers of Corporate Investment Banker Strain: Low Risk  (06/25/2024)   Overall Financial Resource Strain (CARDIA)    Difficulty of Paying Living Expenses: Not very hard  Food Insecurity: No Food Insecurity (06/25/2024)   Hunger Vital Sign    Worried About Running Out of Food in the Last Year: Never true    Ran Out of Food in the Last Year: Never true  Transportation Needs: No Transportation Needs (06/25/2024)   PRAPARE - Administrator, Civil Service (Medical): No    Lack of Transportation (Non-Medical): No  Physical Activity: Insufficiently Active (06/25/2024)   Exercise Vital Sign    Days of Exercise per Week: 4 days    Minutes of Exercise  per Session: 30 min  Stress: Stress Concern Present (06/25/2024)   [employer] of Occupational Health - Occupational Stress Questionnaire    Feeling of Stress: To some extent  Social Connections: Unknown (06/25/2024)   Social Connection and Isolation Panel    Frequency of Communication with Friends and Family: Three times a week    Frequency of Social Gatherings with Friends and Family: Twice a week    Attends Religious Services: 1 to 4 times per year    Active Member of [employer] or Organizations: Yes    Attends Banker Meetings: 1 to 4 times per year    Marital Status: Patient declined  Intimate Partner Violence: Unknown (11/30/2022)   Received from [facility]   HITS    Physically Hurt: Not on file    Insult or Talk Down To: Not on file    Threaten Physical Harm: Not on file    Scream or Curse: Not on file      PHYSICAL EXAM Generalized: Well developed, in no acute distress   Neurological examination  Mentation: Alert oriented to time, place, history taking. Follows all commands speech and language fluent Cranial nerve II-XII: Facial symmetry noted   DIAGNOSTIC DATA (LABS, IMAGING, TESTING) - I reviewed patient records, labs, notes, testing and imaging myself where available.  Lab Results  Component Value Date   WBC 5.7 06/25/2024   HGB 12.0 06/25/2024   HCT 36.2 06/25/2024   MCV 90 06/25/2024   PLT 231 06/25/2024      Component Value Date/Time   NA 141 06/25/2024 1626   K 3.4 (L) 06/25/2024 1626   CL 101 06/25/2024 1626   CO2 25 06/25/2024 1626  GLUCOSE 77 06/25/2024 1626   GLUCOSE 91 10/23/2018 0831   BUN 11 06/25/2024 1626   CREATININE 0.98 06/25/2024 1626   CALCIUM 9.6 06/25/2024 1626   PROT 7.3 06/25/2024 1626   ALBUMIN 4.6 06/25/2024 1626   AST 23 06/25/2024 1626   ALT 22 06/25/2024 1626   ALKPHOS 69 06/25/2024 1626   BILITOT 0.5 06/25/2024 1626   GFRNONAA 72 12/01/2020 1732   GFRAA 83 12/01/2020 1732   Lab Results  Component  Value Date   CHOL 226 (H) 06/25/2024   HDL 81 06/25/2024   LDLCALC 128 (H) 06/25/2024   TRIG 96 06/25/2024   CHOLHDL 2.8 06/25/2024   Lab Results  Component Value Date   HGBA1C 5.4 06/25/2024   Lab Results  Component Value Date   VITAMINB12 >2000 (H) 05/21/2022   Lab Results  Component Value Date   TSH 0.679 06/25/2024      ASSESSMENT AND PLAN 53 y.o. year old female  has a past medical history of Allergy , Anxiety, Back pain, Chronic RLQ pain, Constipation, Deviated septum, Diverticulitis, Ectopic pregnancy (1995), Eczema, GERD (gastroesophageal reflux disease), HTN (hypertension) (05/24/2015), Migraine without aura and without status migrainosus, not intractable, Miscarriage, Obesity, unspecified (07/06/2010), OSA (obstructive sleep apnea), Pelvic congestion, and Sleep apnea. here with:  OSA on CPAP  CPAP compliance suboptimal Residual AHI is good Will try DreamWear fullface mask Encouraged patient to continue using CPAP nightly and > 4 hours each night F/U in 1 year or sooner if needed  Orders Placed This Encounter  Procedures   For home use only DME continuous positive airway pressure (CPAP)    Mask refitting- try fullface dreamwear or any other mask patient feels that she would tolerate    Length of Need:   12 Months    Patient has OSA or probable OSA:   Yes    Is the patient currently using CPAP in the home:   Yes    Settings:   Other see comments    CPAP supplies needed:   Mask, headgear, cushions, filters, heated tubing and water chamber     Duwaine Russell, MSN, NP-C 09/16/2024, 9:39 AM [facility] [address] [place], [place] [address] [phone]  The patient's condition requires frequent monitoring and adjustments in the treatment plan, reflecting the ongoing complexity of care.  This provider is the continuing focal point for all needed services for this condition.

## 2024-09-16 NOTE — Patient Instructions (Signed)
Continue using CPAP nightly and greater than 4 hours each night.  °Mask refitting °If your symptoms worsen or you develop new symptoms please let us know.  ° °

## 2024-09-17 NOTE — Progress Notes (Signed)
 RE: mask refitting Received: Today New, Adine Neysa Nena GORMAN, RN; Joylene Carlean Sheree Leveda Viktoria Dortha Jackson Avelina; 1 other Received, thank you!       Previous Messages    ----- Message ----- From: Neysa Nena GORMAN, RN Sent: 09/16/2024   9:58 AM EST To: Adine Randolm Avelina Jackson; Ephraim Viktoria* Subject: mask refitting                                New order in EPIC for mask refitting,  Desiree Dunn Female, 53 y.o., [birth year] MRN: [id number]  Thank you ,  Desiree Dunn

## 2024-09-21 ENCOUNTER — Ambulatory Visit (INDEPENDENT_AMBULATORY_CARE_PROVIDER_SITE_OTHER): Admitting: Podiatry

## 2024-09-21 DIAGNOSIS — B070 Plantar wart: Secondary | ICD-10-CM | POA: Diagnosis not present

## 2024-09-23 NOTE — Progress Notes (Signed)
 Subjective:   Patient ID: Desiree Dunn, female   DOB: 53 y.o.   MRN: [id number]   HPI Patient states she developed a lot of pain on the bottom of the right foot and the lesion has come up recently that is tender and hard to walk on   ROS      Objective:  Physical Exam  Neurovascular status intact with lesion Sub right forefoot measuring about 7 x 7 mm with pinpoint bleeding upon debridement pain to lateral pressure     Assessment:  Probability for verruca plantaris right     Plan:  Sharp sterile debridement of the lesion and applied chemical agent to treat immune response with sterile dressing and I did explain what to do if any blistering were to occur

## 2024-09-24 ENCOUNTER — Other Ambulatory Visit (HOSPITAL_COMMUNITY): Payer: Self-pay

## 2024-09-24 ENCOUNTER — Ambulatory Visit: Payer: Self-pay | Admitting: Internal Medicine

## 2024-09-24 ENCOUNTER — Encounter: Payer: Self-pay | Admitting: Internal Medicine

## 2024-09-24 VITALS — BP 124/80 | HR 98 | Temp 98.5°F | Ht 64.0 in | Wt 202.6 lb

## 2024-09-24 DIAGNOSIS — Z6836 Body mass index (BMI) 36.0-36.9, adult: Secondary | ICD-10-CM

## 2024-09-24 DIAGNOSIS — E66811 Obesity, class 1: Secondary | ICD-10-CM

## 2024-09-24 DIAGNOSIS — M722 Plantar fascial fibromatosis: Secondary | ICD-10-CM | POA: Insufficient documentation

## 2024-09-24 DIAGNOSIS — E6609 Other obesity due to excess calories: Secondary | ICD-10-CM | POA: Diagnosis not present

## 2024-09-24 DIAGNOSIS — Z6834 Body mass index (BMI) 34.0-34.9, adult: Secondary | ICD-10-CM

## 2024-09-24 DIAGNOSIS — L409 Psoriasis, unspecified: Secondary | ICD-10-CM

## 2024-09-24 DIAGNOSIS — I10 Essential (primary) hypertension: Secondary | ICD-10-CM

## 2024-09-24 MED ORDER — ZEPBOUND 12.5 MG/0.5ML ~~LOC~~ SOAJ
12.5000 mg | SUBCUTANEOUS | 3 refills | Status: AC
  Filled 2024-09-24 – 2024-10-07 (×2): qty 2, 28d supply, fill #0

## 2024-09-24 NOTE — Progress Notes (Signed)
 I,Victoria T Emmitt, CMA,acting as a neurosurgeon for Catheryn LOISE Slocumb, MD.,have documented all relevant documentation on the behalf of Catheryn LOISE Slocumb, MD,as directed by  Catheryn LOISE Slocumb, MD while in the presence of Catheryn LOISE Slocumb, MD.  Subjective:  Patient ID: Desiree Dunn , female    DOB: 10/29/1971 , 53 y.o.   MRN: [id number]  Chief Complaint  Patient presents with   Weight Check    Patient presents today for weight check. She reports compliance with Zepbound  12.5MG . denies headache, chest pain & sob. She has no specific questions or concerns.    HPI Discussed the use of AI scribe software for clinical note transcription with the patient, who gave verbal consent to proceed.  History of Present Illness Desiree Dunn is a 53 year old female who presents for blood pressure and weight management.  She feels generally well with no major complaints.  She has a goal weight of 185 pounds and has lost seven pounds since September. Her weight has stabilized over the past two weeks at approximately 201-202 pounds. A flare-up of plantar fasciitis has limited her ability to engage in her usual walking routine, contributing to her recent lack of exercise.  She recently visited a healthcare provider for her plantar fasciitis, where she recalls that a procedure was performed on a knot in the ball of her foot. She engages in chair exercises and other low-impact activities such as Pilates and yoga.  She experienced a flare-up of plantar fasciitis during a work conference in [place] from October 31st to November 4th, which involved extensive walking. She notes improved energy levels compared to previous experiences.  She wears platform sneakers to help with her back and hips. She plans to spend Thanksgiving with her family, visiting her mother-in-law.   Hypertension This is a chronic problem. The current episode started more than 1 year ago. The problem has been gradually improving since  onset. The problem is controlled. Pertinent negatives include no blurred vision. Risk factors for coronary artery disease include obesity and sedentary lifestyle. Past treatments include angiotensin blockers and calcium channel blockers. The current treatment provides moderate improvement.     Past Medical History:  Diagnosis Date   Allergy     Anxiety    Back pain    Chronic RLQ pain    since 1995 when she had ectopic preg   Constipation    Deviated septum    Diverticulitis    Ectopic pregnancy 1995   x 1 , R side    Eczema    GERD (gastroesophageal reflux disease)    HTN (hypertension) 05/24/2015   Migraine without aura and without status migrainosus, not intractable    Miscarriage    x 1   Obesity, unspecified 07/06/2010   OSA (obstructive sleep apnea)    Pelvic congestion    h/o    Sleep apnea    uses cpap     Family History  Problem Relation Age of Onset   Colon cancer Mother 41   Colon cancer Maternal Aunt 67   Stroke Father    Diabetes Father    Hypertension Father    Hyperlipidemia Father    Heart disease Father    Obesity Father    Hypertension Sister    Colon polyps Brother    Colitis Brother    Depression Son    Asperger's syndrome Son    Breast cancer Paternal Grandmother    Prostate cancer Brother    Heart attack Neg Hx  Esophageal cancer Neg Hx    Rectal cancer Neg Hx    Stomach cancer Neg Hx      Current Outpatient Medications:    acetaminophen  (TYLENOL ) 500 MG tablet, Take 1,000 mg by mouth as needed for mild pain or headache., Disp: , Rfl:    albuterol  (VENTOLIN  HFA) 108 (90 Base) MCG/ACT inhaler, Inhale 2 puffs into the lungs every 4 (four) hours as needed., Disp: 6.7 g, Rfl: 2   Azelastine -Fluticasone  (DYMISTA ) 137-50 MCG/ACT SUSP, Place 2 sprays into both nostrils daily., Disp: 23 g, Rfl: 5   Chlorphen-PE-Acetaminophen  (NOREL AD) 4-10-325 MG TABS, Take 1 tablet by mouth 2 (two) times daily as needed, Disp: 20 tablet, Rfl: 1    cyclobenzaprine  (FLEXERIL ) 10 MG tablet, TAKE 1 TABLET BY MOUTH AT BEDTIME AS NEEDED FOR MUSCLE SPASMS, Disp: 20 tablet, Rfl: 0   EPINEPHrine  (EPIPEN  2-PAK) 0.3 mg/0.3 mL IJ SOAJ injection, Inject 0.3 mg into the muscle as needed for anaphylaxis., Disp: 2 each, Rfl: 2   estradiol  (ESTRACE ) 0.1 MG/GM vaginal cream, Place 1 g vaginally every evening for 2 weeks, then twice weekly, Disp: 42.5 g, Rfl: 1   fexofenadine  (ALLEGRA  ALLERGY ) 180 MG tablet, Take 1 tablet (180 mg total) by mouth daily., Disp: 90 tablet, Rfl: 1   hydrochlorothiazide  (MICROZIDE ) 12.5 MG capsule, Take 1 capsule (12.5 mg total) by mouth daily., Disp: 90 capsule, Rfl: 3   hydrocortisone  2.5 % cream, Apply to affected areas 2 (two) times daily as directed., Disp: 454 g, Rfl: 2   hydrOXYzine  (ATARAX ) 25 MG tablet, Take 1 tablet (25 mg total) by mouth at bedtime as needed for itching., Disp: 30 tablet, Rfl: 1   levocetirizine (XYZAL ) 5 MG tablet, Take 1 tablet (5 mg total) by mouth every evening. double to two tablets in the morning for TWO WEEKS., Disp: 90 tablet, Rfl: 1   montelukast  (SINGULAIR ) 10 MG tablet, Take 1 tablet (10 mg total) by mouth at bedtime., Disp: 90 tablet, Rfl: 1   olmesartan  (BENICAR ) 40 MG tablet, Take 1 tablet (40 mg total) by mouth daily., Disp: 90 tablet, Rfl: 1   Olopatadine-Mometasone  (RYALTRIS ) 665-25 MCG/ACT SUSP, Place 2 sprays into the nose 2 (two) times daily as needed (for runny nose of stuffy nose)., Disp: 29 g, Rfl: 2   oxymetazoline  (AFRIN NASAL SPRAY) 0.05 % nasal spray, Place 2 sprays into both nostrils 2 (two) times daily. Use no more than 3-5 days at a time., Disp: 30 mL, Rfl: 0   verapamil  (CALAN -SR) 120 MG CR tablet, Take 1 tablet (120 mg total) by mouth daily., Disp: 90 tablet, Rfl: 1   tirzepatide  (ZEPBOUND ) 12.5 MG/0.5ML Pen, Inject 12.5 mg into the skin once a week., Disp: 2 mL, Rfl: 3   Allergies  Allergen Reactions   Amoxicillin Hives    Hives     Review of Systems  Constitutional:  Negative.   Eyes:  Negative for blurred vision.  Respiratory: Negative.    Cardiovascular: Negative.   Gastrointestinal: Negative.   Neurological: Negative.   Psychiatric/Behavioral: Negative.       Today's Vitals   09/24/24 1509  BP: 124/80  Pulse: 98  Temp: 98.5 F (36.9 C)  SpO2: 98%  Weight: 202 lb 9.6 oz (91.9 kg)  Height: 5' 4 (1.626 m)   Body mass index is 34.78 kg/m.  Wt Readings from Last 3 Encounters:  09/24/24 202 lb 9.6 oz (91.9 kg)  09/04/24 202 lb 9.6 oz (91.9 kg)  08/11/24 209 lb (94.8 kg)  Objective:  Physical Exam Vitals and nursing note reviewed.  Constitutional:      Appearance: Normal appearance. She is obese.  HENT:     Head: Normocephalic and atraumatic.  Eyes:     Extraocular Movements: Extraocular movements intact.  Cardiovascular:     Rate and Rhythm: Normal rate and regular rhythm.     Heart sounds: Normal heart sounds.  Pulmonary:     Effort: Pulmonary effort is normal.     Breath sounds: Normal breath sounds.  Musculoskeletal:     Cervical back: Normal range of motion.  Skin:    General: Skin is warm.  Neurological:     General: No focal deficit present.     Mental Status: She is alert.  Psychiatric:        Mood and Affect: Mood normal.        Behavior: Behavior normal.       Assessment And Plan:  Class 1 obesity due to excess calories with serious comorbidity and body mass index (BMI) of 34.0 to 34.9 in adult Assessment & Plan: Weight loss of 7 lbs since September with increased energy levels. Limited exercise due to plantar fasciitis. - Continue chair exercises and Pilates/yoga. - Encouraged use of weighted vest for exercises. - Encouraged walking with appropriate footwear during work trips. - Continue with Zepbound  12.5mg  weekly.  - Follow up in Feb 2026.    Essential hypertension Assessment & Plan: Chronic, controlled.   She will continue with hydrochlorothiazide  12.5mg  daily, verapamil  120mcg CR daily and  olmesartan  40mg  daily. She is reminded to follow low sodium diet. She will f/u in three months.    Psoriasis of scalp Assessment & Plan: She reports having a previous diagnosis, wants Derm referral.  - May benefit from clobetasol drops  - Referral placed to Dr. Delon Lenis.   Orders: -     Ambulatory referral to Dermatology  Plantar fasciitis, bilateral Assessment & Plan: Recent flare-up managed with debridement and pressure offloading device. - Continue using the round device to offload pressure from the foot. - Encouraged stretching and strengthening exercises for the Achilles tendon. - followed by Podiatry   Other orders -     Zepbound ; Inject 12.5 mg into the skin once a week.  Dispense: 2 mL; Refill: 3   Return if symptoms worsen or fail to improve.  Patient was given opportunity to ask questions. Patient verbalized understanding of the plan and was able to repeat key elements of the plan. All questions were answered to their satisfaction.   I, Catheryn LOISE Slocumb, MD, have reviewed all documentation for this visit. The documentation on 09/24/24 for the exam, diagnosis, procedures, and orders are all accurate and complete.   IF YOU HAVE BEEN REFERRED TO A SPECIALIST, IT MAY TAKE 1-2 WEEKS TO SCHEDULE/PROCESS THE REFERRAL. IF YOU HAVE NOT HEARD FROM US /SPECIALIST IN TWO WEEKS, PLEASE GIVE US  A CALL AT [phone].   THE PATIENT IS ENCOURAGED TO PRACTICE SOCIAL DISTANCING DUE TO THE COVID-19 PANDEMIC.

## 2024-09-24 NOTE — Assessment & Plan Note (Signed)
 Weight loss of 7 lbs since September with increased energy levels. Limited exercise due to plantar fasciitis. - Continue chair exercises and Pilates/yoga. - Encouraged use of weighted vest for exercises. - Encouraged walking with appropriate footwear during work trips. - Continue with Zepbound  12.5mg  weekly.  - Follow up in Feb 2026.

## 2024-09-24 NOTE — Patient Instructions (Signed)

## 2024-09-24 NOTE — Assessment & Plan Note (Signed)
 Chronic, controlled.   She will continue with hydrochlorothiazide  12.5mg  daily, verapamil  120mcg CR daily and olmesartan  40mg  daily. She is reminded to follow low sodium diet. She will f/u in three months.

## 2024-09-24 NOTE — Assessment & Plan Note (Signed)
 Recent flare-up managed with debridement and pressure offloading device. - Continue using the round device to offload pressure from the foot. - Encouraged stretching and strengthening exercises for the Achilles tendon. - followed by Podiatry

## 2024-09-24 NOTE — Assessment & Plan Note (Signed)
 She reports having a previous diagnosis, wants Derm referral.  - May benefit from clobetasol drops  - Referral placed to Dr. Delon Lenis.

## 2024-10-05 ENCOUNTER — Ambulatory Visit (INDEPENDENT_AMBULATORY_CARE_PROVIDER_SITE_OTHER): Admitting: Podiatry

## 2024-10-05 DIAGNOSIS — M7751 Other enthesopathy of right foot: Secondary | ICD-10-CM

## 2024-10-05 DIAGNOSIS — M2041 Other hammer toe(s) (acquired), right foot: Secondary | ICD-10-CM

## 2024-10-05 MED ORDER — TRIAMCINOLONE ACETONIDE 10 MG/ML IJ SUSP
10.0000 mg | Freq: Once | INTRAMUSCULAR | Status: AC
  Administered 2024-10-05: 10 mg via INTRA_ARTICULAR

## 2024-10-06 NOTE — Progress Notes (Signed)
 Subjective:   Patient ID: Desiree Dunn, female   DOB: 53 y.o.   MRN: [id number]   HPI Patient states she is getting a lot of pain in the fourth digit of her right foot and moderate in the fifth digit and does not remember injury   ROS      Objective:  Physical Exam  Neurovascular status intact with inflammation pain of the inner phalangeal joint digit 4 right moderate rotation digit 5 right pressing against the fourth toe     Assessment:  Inflammatory capsulitis digit 4 right fluid buildup noted with moderate digital deformity     Plan:  H&P reviewed discussed at 1 point derotational arthroplasty possibility for bone structure removal but at this point sterile prep injected the inner phalangeal joint digit 4 right 3 mg dexamethasone  Kenalog  5 mg Xylocaine  and applied sterile dressing and we will see the response and decide what else may be necessary

## 2024-10-07 ENCOUNTER — Other Ambulatory Visit: Payer: Self-pay

## 2024-10-07 ENCOUNTER — Other Ambulatory Visit (HOSPITAL_COMMUNITY): Payer: Self-pay

## 2024-10-10 ENCOUNTER — Other Ambulatory Visit (HOSPITAL_COMMUNITY): Payer: Self-pay

## 2024-10-16 ENCOUNTER — Encounter: Payer: Self-pay | Admitting: Adult Health

## 2024-10-16 ENCOUNTER — Encounter: Payer: Self-pay | Admitting: Internal Medicine

## 2024-10-19 ENCOUNTER — Other Ambulatory Visit: Payer: Self-pay

## 2024-10-19 ENCOUNTER — Other Ambulatory Visit (HOSPITAL_COMMUNITY): Payer: Self-pay

## 2024-10-19 DIAGNOSIS — E6609 Other obesity due to excess calories: Secondary | ICD-10-CM

## 2024-10-19 MED ORDER — ZEPBOUND 15 MG/0.5ML ~~LOC~~ SOAJ
15.0000 mg | SUBCUTANEOUS | 1 refills | Status: AC
  Filled 2024-10-19 – 2024-11-09 (×2): qty 2, 28d supply, fill #0
  Filled 2024-12-05: qty 2, 28d supply, fill #1

## 2024-10-19 NOTE — Telephone Encounter (Signed)
 New, Adine Boring, Heather CROME, RN; [place], [employer],  I just recieved reply back. Patient recived supplies on 09/18/2024 and would not be eligable for supplies through insurance.  Also, Patient will need to contact us  regrading her accout before any service can be provided.    Thank you,  Arvella Leer

## 2024-10-19 NOTE — Telephone Encounter (Signed)
 New, Adine Boring, Heather CROME, RN; Joylene Adine; Tucker, Dolanda; Ziegler, Melissa; [place], Merilee; 1 other Hello,  Order recieved 09/21/2024 1237pm and assinged to [employer]. Order is still pending and open. Will email branch to contact patient.  Thanks,  [employer]

## 2024-10-19 NOTE — Telephone Encounter (Signed)
 Message sent to [employer]

## 2024-10-19 NOTE — Telephone Encounter (Signed)
 Noted

## 2024-10-20 ENCOUNTER — Other Ambulatory Visit (HOSPITAL_COMMUNITY): Payer: Self-pay

## 2024-10-21 ENCOUNTER — Other Ambulatory Visit (HOSPITAL_COMMUNITY): Payer: Self-pay

## 2024-10-21 NOTE — Telephone Encounter (Signed)
 Noted

## 2024-10-24 ENCOUNTER — Other Ambulatory Visit (HOSPITAL_COMMUNITY): Payer: Self-pay

## 2024-10-30 ENCOUNTER — Other Ambulatory Visit (HOSPITAL_COMMUNITY): Payer: Self-pay

## 2024-11-09 ENCOUNTER — Encounter: Payer: Self-pay | Admitting: Allergy

## 2024-11-09 ENCOUNTER — Other Ambulatory Visit (HOSPITAL_COMMUNITY): Payer: Self-pay

## 2024-11-13 NOTE — Progress Notes (Signed)
" ° °  [address] [place] [address] Dept: [phone]  Follow-up Note  RE: Desiree Dunn MRN: [id number] DOB: Nov 02, 1971 Date of Office Visit: 11/16/2024  Primary care provider: Jarold Medici, MD Referring provider: Jarold Medici, MD   [name] returns to the office today for the patch test placement, given suspected history of contact dermatitis.  Patient reports feeling well overall with no steroid use over the last 4 weeks. Care of patches discussed in detail, specifically the need to keep patches dry and in place. All questions answered at this time.    Diagnostics: NAC 80 patches placed NAC-80 (1-80)   1. Ammonium persulfate  2. [place] Balsam  3. Aluminum (III) chloride hexahydrate  4. 4-tert-Butylphenolformaldehyde resin (PTBP)  5. Bacitracin  6. Budesonide  7. Quaternium-15  8. Cinnamal  9. Cobalt(II) chloride hexahydrate  10. Colophonium  11. Methyldibromo glutaronitrile  12. Decyl Glucoside  13. Ethylenediamine dihydrochloride  14. 2-Hydroxyethyl methacrylate  15. Hydroperoxides of Linalool  16. Iodopropynyl butylcarbamate  17. 2-Mercaptobenzothiazole (MBT)  18. Thiuram mix  19. METHYLISOTHIAZOLINONE  20. Propylene glycol  21. 1,3-Diphenylguanidine  22. Hydroperoxides of Limonene  23. Black rubber mix  24. Carba mix  25. Fragrance mix I  26. Fragrance mix II  27. Textile dye mix II  28. Neomycin sulfate  29. Nickel(II) sulfate hexahydrate  30. p-Phenylenediamine (PPD)  31. Potassium dichromate  32. Propolis  33. Sodium Metabisulfite  34. Tixocortol-21-pivalate  35. Lanolin alcohol  36. Methylisothiazolinone + Methylchloroisothiazolinone  37. Cocamidopropyl betaine  38. 3-(Dimethylamino)-1-propylamine  39. Formaldehyde  40. Oleamidopropyl dimethylamine  41. 2-Bromo-2-Nitropropane-l,3-diol  42. Diazolidinyl urea  43. DMDM Hydantoin  44. Epoxy resin, Bisphenol A  45. Benzophenone-4  46. Imidazolidinyl urea  47. Lauryl polyglucose  48  Methyl methacrylate  49. Paraben mix  50. Mercapto mix  51. Caine mix III  52. Mixed dialkyl thiourea  53. Compositae mix II  54. Toluenesulfonamide formaldehyde resin  55. Tea Tree Oil oxidized  56. Ylang-Ylang oil  57. Amidoamine  58. Amerchol L 101  59. Benzocaine  60. Benzyl alchohol  61. Benzyl salicylate  62. Chloroxylenol (PCMX)  63. Cocamide DEA  64. Clobetasol-17-propionate  65. Toluene-2,5-Diamine sulfate  66. Ethyl acrylate  67. N-Isopropyl-N-phenyl--4-phenylenediamine (IPPD)  68. Lidocaine   69. Gold (I) sodium thiosulfate dihydrate  70. Sesquiterpene lactone mix  71. 2-n-Octyl-4-isothiazolin-3-one  72. Propyl gallate  73. Polymyxin B sulfate  74. Pramoxine hydrochloride  75. Sodium benzoate  76. Sorbitan oleate  77. Sorbitan sesquioleate  78. Tocopherol  79. BENZALKONIUM CHLORIDE  80. Chlorhexidine  digluconate    Allergic contact dermatitis - Instructions provided on care of the patches for the next 48 hours. - Armetta was instructed to avoid showering for the next 48 hours. [name] will follow up in 48 hours and 96 hours for patch readings.    Call the clinic if this treatment plan is not working well for you  Follow up in 2 days or sooner if needed.  Thank you for the opportunity to care for this patient.  Please do not hesitate to contact me with questions.  [name], FNP [facility]  [facility]  "

## 2024-11-13 NOTE — Patient Instructions (Signed)
" ° °  [address] [place] [address] Dept: [phone]   Diagnostics: NAC 80 patches placed NAC-80 (1-80)   1. Ammonium persulfate  2. [place] Balsam  3. Aluminum (III) chloride hexahydrate  4. 4-tert-Butylphenolformaldehyde resin (PTBP)  5. Bacitracin  6. Budesonide  7. Quaternium-15  8. Cinnamal  9. Cobalt(II) chloride hexahydrate  10. Colophonium  11. Methyldibromo glutaronitrile  12. Decyl Glucoside  13. Ethylenediamine dihydrochloride  14. 2-Hydroxyethyl methacrylate  15. Hydroperoxides of Linalool  16. Iodopropynyl butylcarbamate  17. 2-Mercaptobenzothiazole (MBT)  18. Thiuram mix  19. METHYLISOTHIAZOLINONE  20. Propylene glycol  21. 1,3-Diphenylguanidine  22. Hydroperoxides of Limonene  23. Black rubber mix  24. Carba mix  25. Fragrance mix I  26. Fragrance mix II  27. Textile dye mix II  28. Neomycin sulfate  29. Nickel(II) sulfate hexahydrate  30. p-Phenylenediamine (PPD)  31. Potassium dichromate  32. Propolis  33. Sodium Metabisulfite  34. Tixocortol-21-pivalate  35. Lanolin alcohol  36. Methylisothiazolinone + Methylchloroisothiazolinone  37. Cocamidopropyl betaine  38. 3-(Dimethylamino)-1-propylamine  39. Formaldehyde  40. Oleamidopropyl dimethylamine  41. 2-Bromo-2-Nitropropane-l,3-diol  42. Diazolidinyl urea  43. DMDM Hydantoin  44. Epoxy resin, Bisphenol A  45. Benzophenone-4  46. Imidazolidinyl urea  47. Lauryl polyglucose  48 Methyl methacrylate  49. Paraben mix  50. Mercapto mix  51. Caine mix III  52. Mixed dialkyl thiourea  53. Compositae mix II  54. Toluenesulfonamide formaldehyde resin  55. Tea Tree Oil oxidized  56. Ylang-Ylang oil  57. Amidoamine  58. Amerchol L 101  59. Benzocaine  60. Benzyl alchohol  61. Benzyl salicylate  62. Chloroxylenol (PCMX)  63. Cocamide DEA  64. Clobetasol-17-propionate  65. Toluene-2,5-Diamine sulfate  66. Ethyl acrylate  67. N-Isopropyl-N-phenyl--4-phenylenediamine (IPPD)  68. Lidocaine    69. Gold (I) sodium thiosulfate dihydrate  70. Sesquiterpene lactone mix  71. 2-n-Octyl-4-isothiazolin-3-one  72. Propyl gallate  73. Polymyxin B sulfate  74. Pramoxine hydrochloride  75. Sodium benzoate  76. Sorbitan oleate  77. Sorbitan sesquioleate  78. Tocopherol  79. BENZALKONIUM CHLORIDE  80. Chlorhexidine  digluconate    Allergic contact dermatitis - Instructions provided on care of the patches for the next 48 hours. - Vanissa was instructed to avoid showering for the next 48 hours. Mikeala Girdler will follow up in 48 hours and 96 hours for patch readings.    Call the clinic if this treatment plan is not working well for you  Follow up in 2 days or sooner if needed. "

## 2024-11-16 ENCOUNTER — Ambulatory Visit (INDEPENDENT_AMBULATORY_CARE_PROVIDER_SITE_OTHER): Admitting: Family Medicine

## 2024-11-16 ENCOUNTER — Encounter: Payer: Self-pay | Admitting: Family Medicine

## 2024-11-16 DIAGNOSIS — L235 Allergic contact dermatitis due to other chemical products: Secondary | ICD-10-CM | POA: Diagnosis not present

## 2024-11-16 DIAGNOSIS — L259 Unspecified contact dermatitis, unspecified cause: Secondary | ICD-10-CM | POA: Insufficient documentation

## 2024-11-18 ENCOUNTER — Ambulatory Visit (INDEPENDENT_AMBULATORY_CARE_PROVIDER_SITE_OTHER): Admitting: Family

## 2024-11-18 ENCOUNTER — Other Ambulatory Visit: Payer: Self-pay

## 2024-11-18 ENCOUNTER — Other Ambulatory Visit (HOSPITAL_COMMUNITY): Payer: Self-pay

## 2024-11-18 ENCOUNTER — Encounter: Payer: Self-pay | Admitting: Family

## 2024-11-18 DIAGNOSIS — L235 Allergic contact dermatitis due to other chemical products: Secondary | ICD-10-CM | POA: Diagnosis not present

## 2024-11-18 NOTE — Progress Notes (Signed)
 Shakesha returns to the office today for the 48 hour NAC- 80 patch test interpretation, given suspected history of contact dermatitis.    Diagnostics:   NAC 80: 48-hour hour reading:   Irritant response to: propylene glycol, Methylisothiazolinone + Methylchloroisothiazolinone , and chloroxylenol (PCMX)  Plan:   Allergic contact dermatitis - The patient has been provided detailed information regarding the substances she is sensitive to, as well as products containing the substances.   - Meticulous avoidance of these substances is recommended.  - If avoidance is not possible, the use of barrier creams or lotions is recommended. - If symptoms persist or progress despite meticulous avoidance of these items, Dermatology Referral may be warranted.   Wanda Craze, FNP [facility]

## 2024-11-20 ENCOUNTER — Ambulatory Visit: Admitting: Internal Medicine

## 2024-11-20 DIAGNOSIS — L235 Allergic contact dermatitis due to other chemical products: Secondary | ICD-10-CM | POA: Diagnosis not present

## 2024-11-20 NOTE — Progress Notes (Signed)
 "  Follow Up Note  RE: Desiree Dunn MRN: [id number] DOB: July 29, 1971 Date of Office Visit: 11/20/2024  Referring provider: Jarold Medici, MD Primary care provider: Jarold Medici, MD  History of Present Illness: I had the pleasure of seeing Desiree Dunn for a follow up visit at the [facility] on 11/20/2024. She is a 54 y.o. female, who is being followed for contact dermatitis. Today she is here for final patch test interpretation, given suspected history of contact dermatitis.   Diagnostics:   NAC 80 96-hour hour reading:  NAC Panel Tested NAC-80 (1-80)   1. Ammonium persulfate Negative   2. [place] Balsam Negative   3. Aluminum (III) chloride hexahydrate  Negative   4. 4-tert-Butylphenolformaldehyde resin (PTBP) Negative   5. Bacitracin Negative   6. Budesonide Negative   7. Quaternium-15 Negative   8. Cinnamal Negative   9. Cobalt(II) chloride hexahydrate Negative  10. Colophonium Negative   11. Methyldibromo glutaronitrile Negative   12. Decyl Glucoside Negative   13. Ethylenediamine dihydrochloride Negative   14. 2-Hydroxyethyl methacrylate Negative   15. Hydroperoxides of Linalool Negative   16. Iodopropynyl butylcarbamate Negative   17. 2-Mercaptobenzothiazole (MBT) Negative   18. Thiuram mix Negative   19. METHYLISOTHIAZOLINONE Negative   20. Propylene glycol Negative   21. 1,3-Diphenylguanidine Negative   22. Hydroperoxides of Limonene Negative   23. Black rubber mix Negative   24. Carba mix Negative   25. Fragrance mix I Negative   26. Fragrance mix II Negative   27. Textile dye mix II Negative   28. Neomycin sulfate Negative   29. Nickel(II) sulfate hexahydrate Negative  30. p-Phenylenediamine (PPD) Negative   31. Potassium dichromate Negative   32. Propolis Negative   33. Sodium Metabisulfite Negative   34. Tixocortol-21-pivalate Negative   35. Lanolin alcohol Negative   36. Methylisothiazolinone +  Methylchloroisothiazolinone Negative   37. Cocamidopropyl betaine Negative   38. 3-(Dimethylamino)-1-propylamine Negative   39. Formaldehyde Negative  40. Oleamidopropyl dimethylamine Negative   41. 2-Bromo-2-Nitropropane-l,3-diol Negative   42. Diazolidinyl urea Negative  43. DMDM Hydantoin Negative   44. Epoxy resin, Bisphenol A Negative   45. Benzophenone-4 Negative   46. Imidazolidinyl urea Negative   47. Lauryl polyglucose Negative  48 Methyl methacrylate Negative   49. Paraben mix Negative   50. Mercapto mix Negative   51. Caine mix III Negative   52. Mixed dialkyl thiourea Negative   53. Compositae mix II Negative   54. Toluenesulfonamide formaldehyde resin Negative   55. Tea Tree Oil oxidized Negative   56. Ylang-Ylang oil Negative   57. Amidoamine Negative   58. Amerchol L 101 Negative   59. Benzocaine Negative   60. Benzyl alchohol Negative   61. Benzyl salicylate Negative   62. Chloroxylenol (PCMX) Negative   63. Cocamide DEA Negative   64. Clobetasol-17-propionate Negative   65. Toluene-2,5-Diamine sulfate Negative   66. Ethyl acrylate Negative   67. N-Isopropyl-N-phenyl--4-phenylenediamine (IPPD) Negative   68. Lidocaine  Negative   69. Gold (I) sodium thiosulfate dihydrate  Negative   70. Sesquiterpene lactone mix Negative   71. 2-n-Octyl-4-isothiazolin-3-one Negative   72. Propyl gallate Negative   73. Polymyxin B sulfate Negative   74. Pramoxine hydrochloride Negative   75. Sodium benzoate Negative   76. Sorbitan oleate Negative   77. Sorbitan sesquioleate Negative   78. Tocopherol Negative   79. BENZALKONIUM CHLORIDE Negative   80. Chlorhexidine  digluconate Negative   3Assessment and Plan: [name] is a 54 y.o. female with:  Concern for Contact Dermatitis:  The patient has been provided detailed information regarding the substances she is sensitive to, as well as products containing the substances.  Meticulous avoidance of these substances is recommended.  If avoidance is not possible, the use of barrier creams or lotions is recommended. If symptoms persist or progress despite meticulous avoidance of chemicals/substances above, dermatology evaluation may be warranted. Will send safe product list based on questionable reactions from prior visit: propylene glycol, Methylisothiazolinone + Methylchloroisothiazolinone , and chloroxylenol (PCMX)  Return in about 3 months (around 02/18/2025).  It was my pleasure to see Desiree Dunn today and participate in her care. Please feel free to contact me with any questions or concerns.  Sincerely,   Arleta Blanch, MD [facility]   "

## 2024-11-24 ENCOUNTER — Ambulatory Visit: Admitting: Internal Medicine

## 2024-11-24 ENCOUNTER — Encounter: Payer: Self-pay | Admitting: Internal Medicine

## 2024-11-24 ENCOUNTER — Other Ambulatory Visit (HOSPITAL_COMMUNITY): Payer: Self-pay

## 2024-11-24 VITALS — BP 120/84 | HR 99 | Temp 98.1°F | Ht 64.0 in | Wt 202.0 lb

## 2024-11-24 DIAGNOSIS — Z6834 Body mass index (BMI) 34.0-34.9, adult: Secondary | ICD-10-CM

## 2024-11-24 DIAGNOSIS — N63 Unspecified lump in unspecified breast: Secondary | ICD-10-CM

## 2024-11-24 DIAGNOSIS — E66811 Obesity, class 1: Secondary | ICD-10-CM | POA: Diagnosis not present

## 2024-11-24 DIAGNOSIS — E6609 Other obesity due to excess calories: Secondary | ICD-10-CM

## 2024-11-24 MED ORDER — DOXYCYCLINE HYCLATE 100 MG PO TABS
100.0000 mg | ORAL_TABLET | Freq: Two times a day (BID) | ORAL | 0 refills | Status: AC
  Filled 2024-11-24: qty 20, 10d supply, fill #0

## 2024-11-24 NOTE — Patient Instructions (Signed)
 Skin Abscess    A skin abscess is an infected spot of skin. It can have pus in it. An abscess can happen in any part of your body.  Some abscesses break open (rupture) on their own. Most keep getting worse unless they are treated. If your abscess is not treated, the infection can spread deeper into your body and blood. This can make you feel sick.  What are the causes?  Germs that enter your skin. This may happen if you have:  A cut or scrape.  A wound from a needle or an insect bite.  Blocked oil or sweat glands.  A problem with the spot where your hair goes into your skin.  A fluid-filled sac called a cyst under your skin.  What increases the risk?  Having problems with how your blood moves through your body.  Having a weak body defense system (immune system).  Having diabetes.  Having dry and irritated skin.  Needing to get shots often.  Putting drugs into your body with a needle.  Having a splinter or something else in your skin.  Smoking.  What are the signs or symptoms?  A firm bump under your skin that hurts.  A bump with pus at the top.  Redness and swelling.  Warm or tender spots.  A sore on the skin.  How is this treated?  You may need to:  Put a heat pack or a warm, wet washcloth on the spot.  Have the pus drained.  Take antibiotics.  Follow these instructions at home:  Medicines  Take over-the-counter and prescription medicines only as told by your doctor.  If you were prescribed antibiotics, take them as told by your doctor. Do not stop taking them even if you start to feel better.  Abscess care    If you have an abscess that has not drained, put heat on it. Use the heat source that your doctor recommends, such as a moist heat pack or a heating pad.  Place a towel between your skin and the heat source.  Leave the heat on for 20-30 minutes.  If your skin turns bright red, take off the heat right away to prevent burns. The risk of burns is higher if you cannot feel pain, heat, or cold.  Follow  instructions from your doctor about how to take care of your abscess. Make sure you:  Cover the abscess with a bandage.  Wash your hands with soap and water for at least 20 seconds before and after you change your bandage. If you cannot use soap and water, use hand sanitizer.  Change your bandage as told by your doctor.  Check your abscess every day for signs that the infection is getting worse. Check for:  More redness, swelling, or pain.  More fluid or blood.  Warmth.  More pus or a worse smell.  General instructions  To keep the infection from spreading:  Do not share personal items or towels.  Do not go in a hot tub with others.  Avoid making skin contact with others.  Be careful when you get rid of used bandages or any pus from the abscess.  Do not smoke or use any products that contain nicotine or tobacco. If you need help quitting, ask your doctor.  Contact a doctor if:  You see red streaks on your skin near the abscess.  You have any signs of worse infection.  You vomit every time you eat or drink.  You have  a fever, chills, or muscle aches.  The cyst or abscess comes back.  Get help right away if:  You have very bad pain.  You make less pee (urine) than normal.  This information is not intended to replace advice given to you by your health care provider. Make sure you discuss any questions you have with your health care provider.  Document Revised: 06/13/2022 Document Reviewed: 06/13/2022  Elsevier Patient Education  2024 [employer].

## 2024-11-24 NOTE — Progress Notes (Signed)
 I,Desiree Dunn, CMA,acting as a neurosurgeon for Desiree LOISE Slocumb, MD.,have documented all relevant documentation on the behalf of Desiree LOISE Slocumb, MD,as directed by  Desiree LOISE Slocumb, MD while in the presence of Desiree LOISE Slocumb, MD.  Subjective:  Patient ID: Desiree Dunn , female    DOB: 1971/04/18 , 54 y.o.   MRN: [id number]  Chief Complaint  Patient presents with   Breast Mass    Patient presents today for a lump in her right breast. Patient reports she noticed the lump under her breast where her bra line is on saturday. She reports the lump is painful. She reports she does have a maternal aunt who is in remission from breast cancer.    HPI Discussed the use of AI scribe software for clinical note transcription with the patient, who gave verbal consent to proceed.  History of Present Illness Desiree Dunn is a 54 year old female who presents with a tender lump under her right breast.  She noticed the tender lump under her right breast while showering on Saturday. It is described as a 'knot' located at her bra line, causing irritation throughout the day, especially at work. The lump is tender. She has not applied any treatment to it yet.  There is a significant family history of cancer. Her maternal aunt is currently battling breast cancer and has undergone a double mastectomy. Her paternal grandmother passed away from breast cancer. Additionally, her mother had colon cancer, and her maternal aunt also had colon cancer. Due to this family history, she has been undergoing screenings for colon cancer since her late twenties.  She has not undergone genetic testing but is interested in doing so. She has two older sisters who have not reported any similar health issues thus far.  She is allergic to amoxicillin, which causes her to break out in hives.  No prior discussion of the lump with her gynecologist. Reports tenderness and irritation at the site of the lump.    Past  Medical History:  Diagnosis Date   Allergy     Anxiety    Back pain    Chronic RLQ pain    since 1995 when she had ectopic preg   Constipation    Deviated septum    Diverticulitis    Ectopic pregnancy 1995   x 1 , R side    Eczema    GERD (gastroesophageal reflux disease)    HTN (hypertension) 05/24/2015   Migraine without aura and without status migrainosus, not intractable    Miscarriage    x 1   Obesity, unspecified 07/06/2010   OSA (obstructive sleep apnea)    Pelvic congestion    h/o    Sleep apnea    uses cpap     Family History  Problem Relation Age of Onset   Colon cancer Mother 25   Colon cancer Maternal Aunt 70   Stroke Father    Diabetes Father    Hypertension Father    Hyperlipidemia Father    Heart disease Father    Obesity Father    Hypertension Sister    Colon polyps Brother    Colitis Brother    Depression Son    Asperger's syndrome Son    Breast cancer Paternal Grandmother    Prostate cancer Brother    Heart attack Neg Hx    Esophageal cancer Neg Hx    Rectal cancer Neg Hx    Stomach cancer Neg Hx      Current Outpatient Medications:  acetaminophen  (TYLENOL ) 500 MG tablet, Take 1,000 mg by mouth as needed for mild pain or headache., Disp: , Rfl:    albuterol  (VENTOLIN  HFA) 108 (90 Base) MCG/ACT inhaler, Inhale 2 puffs into the lungs every 4 (four) hours as needed., Disp: 6.7 g, Rfl: 2   Azelastine -Fluticasone  (DYMISTA ) 137-50 MCG/ACT SUSP, Place 2 sprays into both nostrils daily., Disp: 23 g, Rfl: 5   Chlorphen-PE-Acetaminophen  (NOREL AD) 4-10-325 MG TABS, Take 1 tablet by mouth 2 (two) times daily as needed, Disp: 20 tablet, Rfl: 1   cyclobenzaprine  (FLEXERIL ) 10 MG tablet, TAKE 1 TABLET BY MOUTH AT BEDTIME AS NEEDED FOR MUSCLE SPASMS, Disp: 20 tablet, Rfl: 0   doxycycline  (VIBRA -TABS) 100 MG tablet, Take 1 tablet (100 mg total) by mouth 2 (two) times daily., Disp: 20 tablet, Rfl: 0   EPINEPHrine  (EPIPEN  2-PAK) 0.3 mg/0.3 mL IJ SOAJ  injection, Inject 0.3 mg into the muscle as needed for anaphylaxis., Disp: 2 each, Rfl: 2   estradiol  (ESTRACE ) 0.1 MG/GM vaginal cream, Place 1 g vaginally every evening for 2 weeks, then twice weekly, Disp: 42.5 g, Rfl: 1   fexofenadine  (ALLEGRA  ALLERGY ) 180 MG tablet, Take 1 tablet (180 mg total) by mouth daily., Disp: 90 tablet, Rfl: 1   hydrochlorothiazide  (MICROZIDE ) 12.5 MG capsule, Take 1 capsule (12.5 mg total) by mouth daily., Disp: 90 capsule, Rfl: 3   hydrocortisone  2.5 % cream, Apply to affected areas 2 (two) times daily as directed., Disp: 454 g, Rfl: 2   hydrOXYzine  (ATARAX ) 25 MG tablet, Take 1 tablet (25 mg total) by mouth at bedtime as needed for itching., Disp: 30 tablet, Rfl: 1   levocetirizine (XYZAL ) 5 MG tablet, Take 1 tablet (5 mg total) by mouth every evening. double to two tablets in the morning for TWO WEEKS., Disp: 90 tablet, Rfl: 1   montelukast  (SINGULAIR ) 10 MG tablet, Take 1 tablet (10 mg total) by mouth at bedtime., Disp: 90 tablet, Rfl: 1   olmesartan  (BENICAR ) 40 MG tablet, Take 1 tablet (40 mg total) by mouth daily., Disp: 90 tablet, Rfl: 1   Olopatadine-Mometasone  (RYALTRIS ) 665-25 MCG/ACT SUSP, Place 2 sprays into the nose 2 (two) times daily as needed (for runny nose of stuffy nose)., Disp: 29 g, Rfl: 2   oxymetazoline  (AFRIN NASAL SPRAY) 0.05 % nasal spray, Place 2 sprays into both nostrils 2 (two) times daily. Use no more than 3-5 days at a time., Disp: 30 mL, Rfl: 0   tirzepatide  (ZEPBOUND ) 12.5 MG/0.5ML Pen, Inject 12.5 mg into the skin once a week., Disp: 2 mL, Rfl: 3   tirzepatide  (ZEPBOUND ) 15 MG/0.5ML Pen, Inject 15 mg into the skin once a week., Disp: 2 mL, Rfl: 1   verapamil  (CALAN -SR) 120 MG CR tablet, Take 1 tablet (120 mg total) by mouth daily., Disp: 90 tablet, Rfl: 1   Allergies  Allergen Reactions   Amoxicillin Hives    Hives     Review of Systems  Constitutional: Negative.   Respiratory: Negative.    Cardiovascular: Negative.    Gastrointestinal: Negative.   Skin:  Positive for wound.  Neurological: Negative.   Psychiatric/Behavioral: Negative.       Today's Vitals   11/24/24 0901  BP: 120/84  Pulse: 99  Temp: 98.1 F (36.7 C)  TempSrc: Oral  Weight: 202 lb (91.6 kg)  Height: 5' 4 (1.626 m)  PainSc: 6    Body mass index is 34.67 kg/m.  Wt Readings from Last 3 Encounters:  11/24/24 202 lb (91.6 kg)  09/24/24 202 lb 9.6 oz (91.9 kg)  09/04/24 202 lb 9.6 oz (91.9 kg)    The 10-year ASCVD risk score (Arnett DK, et al., 2019) is: 2.7%   Values used to calculate the score:     Age: 49 years     Clinically relevant sex: Female     Is Non-Hispanic African American: Yes     Diabetic: No     Tobacco smoker: No     Systolic Blood Pressure: 120 mmHg     Is BP treated: Yes     HDL Cholesterol: 81 mg/dL     Total Cholesterol: 226 mg/dL  Objective:  Physical Exam Vitals and nursing note reviewed.  Constitutional:      Appearance: Normal appearance. She is obese.  HENT:     Head: Normocephalic and atraumatic.  Eyes:     Extraocular Movements: Extraocular movements intact.  Cardiovascular:     Rate and Rhythm: Regular rhythm. Tachycardia present.     Heart sounds: Normal heart sounds.  Pulmonary:     Effort: Pulmonary effort is normal.     Breath sounds: Normal breath sounds.  Chest:  Breasts:    Tanner Score is 5.     Left: Normal.       Comments: Firm, flesh colored nodule with purulent drainage, located in right inframammary fold Tender to touch Slight overlying erythema Well circumscribed border Musculoskeletal:     Cervical back: Normal range of motion.  Skin:    General: Skin is warm.  Neurological:     General: No focal deficit present.     Mental Status: She is alert.  Psychiatric:        Mood and Affect: Mood normal.        Behavior: Behavior normal.         Assessment And Plan:   Assessment & Plan Lump in female breast Acute subcutaneous abscess, tender and draining  pus, unrelated to breast tissue. - Applied antibiotic ointment. - Provided Bactroban  samples for home use. - Advised non-stick dressings. - Recommended sports bra to minimize irritation. Class 1 obesity due to excess calories with serious comorbidity and body mass index (BMI) of 34.0 to 34.9 in adult Chronic, on Zepbound .  She has f/u scheduled February 2026.   No orders of the defined types were placed in this encounter.  Return if symptoms worsen or fail to improve.  Patient was given opportunity to ask questions. Patient verbalized understanding of the plan and was able to repeat key elements of the plan. All questions were answered to their satisfaction.    I, Desiree LOISE Slocumb, MD, have reviewed all documentation for this visit. The documentation on 11/24/2024 for the exam, diagnosis, procedures, and orders are all accurate and complete.   IF YOU HAVE BEEN REFERRED TO A SPECIALIST, IT MAY TAKE 1-2 WEEKS TO SCHEDULE/PROCESS THE REFERRAL. IF YOU HAVE NOT HEARD FROM US /SPECIALIST IN TWO WEEKS, PLEASE GIVE US  A CALL AT [phone].

## 2024-11-26 ENCOUNTER — Other Ambulatory Visit (HOSPITAL_COMMUNITY): Payer: Self-pay

## 2024-12-03 ENCOUNTER — Ambulatory Visit: Admitting: Allergy

## 2024-12-05 ENCOUNTER — Other Ambulatory Visit (HOSPITAL_COMMUNITY): Payer: Self-pay

## 2024-12-05 ENCOUNTER — Other Ambulatory Visit: Payer: Self-pay | Admitting: Internal Medicine

## 2024-12-08 ENCOUNTER — Encounter: Payer: Self-pay | Admitting: Internal Medicine

## 2024-12-08 ENCOUNTER — Other Ambulatory Visit: Payer: Self-pay

## 2024-12-08 ENCOUNTER — Other Ambulatory Visit (HOSPITAL_COMMUNITY): Payer: Self-pay

## 2024-12-08 MED ORDER — OLMESARTAN MEDOXOMIL 40 MG PO TABS
40.0000 mg | ORAL_TABLET | Freq: Every day | ORAL | 1 refills | Status: AC
  Filled 2024-12-08: qty 90, 90d supply, fill #0

## 2024-12-08 MED ORDER — VERAPAMIL HCL ER 120 MG PO TBCR
120.0000 mg | EXTENDED_RELEASE_TABLET | Freq: Every day | ORAL | 1 refills | Status: DC
  Filled 2024-12-08: qty 90, 90d supply, fill #0

## 2024-12-08 MED ORDER — OLMESARTAN MEDOXOMIL 40 MG PO TABS
40.0000 mg | ORAL_TABLET | Freq: Every day | ORAL | 1 refills | Status: DC
  Filled 2024-12-08: qty 90, 90d supply, fill #0

## 2024-12-08 MED ORDER — HYDROCHLOROTHIAZIDE 12.5 MG PO CAPS
12.5000 mg | ORAL_CAPSULE | Freq: Every day | ORAL | 3 refills | Status: DC
  Filled 2024-12-08: qty 90, 90d supply, fill #0

## 2024-12-08 MED ORDER — VERAPAMIL HCL ER 120 MG PO TBCR
120.0000 mg | EXTENDED_RELEASE_TABLET | Freq: Every day | ORAL | 1 refills | Status: AC
  Filled 2024-12-08: qty 90, 90d supply, fill #0

## 2024-12-08 MED ORDER — HYDROCHLOROTHIAZIDE 12.5 MG PO CAPS
12.5000 mg | ORAL_CAPSULE | Freq: Every day | ORAL | 3 refills | Status: AC
  Filled 2024-12-08: qty 90, 90d supply, fill #0

## 2024-12-09 ENCOUNTER — Other Ambulatory Visit (HOSPITAL_COMMUNITY): Payer: Self-pay

## 2024-12-17 ENCOUNTER — Other Ambulatory Visit (HOSPITAL_COMMUNITY): Payer: Self-pay

## 2024-12-24 ENCOUNTER — Ambulatory Visit: Payer: Self-pay | Admitting: Internal Medicine

## 2025-02-25 ENCOUNTER — Ambulatory Visit: Admitting: Allergy

## 2025-05-06 ENCOUNTER — Ambulatory Visit: Admitting: Dermatology

## 2025-06-29 ENCOUNTER — Encounter: Payer: Self-pay | Admitting: Internal Medicine

## 2025-09-21 ENCOUNTER — Telehealth: Admitting: Adult Health
